# Patient Record
Sex: Female | Born: 1935 | Race: White | Hispanic: No | Smoking: Never smoker
Health system: Southern US, Community
[De-identification: ages and names within clinical notes are randomized; demographics above are authoritative.]

## PROBLEM LIST (undated history)

## (undated) DIAGNOSIS — I739 Peripheral vascular disease, unspecified: Secondary | ICD-10-CM

## (undated) DIAGNOSIS — K5730 Diverticulosis of large intestine without perforation or abscess without bleeding: Secondary | ICD-10-CM

## (undated) DIAGNOSIS — K219 Gastro-esophageal reflux disease without esophagitis: Secondary | ICD-10-CM

## (undated) DIAGNOSIS — K644 Residual hemorrhoidal skin tags: Secondary | ICD-10-CM

## (undated) DIAGNOSIS — N184 Chronic kidney disease, stage 4 (severe): Secondary | ICD-10-CM

## (undated) DIAGNOSIS — M1990 Unspecified osteoarthritis, unspecified site: Secondary | ICD-10-CM

## (undated) DIAGNOSIS — N6019 Diffuse cystic mastopathy of unspecified breast: Secondary | ICD-10-CM

## (undated) DIAGNOSIS — R002 Palpitations: Secondary | ICD-10-CM

## (undated) DIAGNOSIS — I10 Essential (primary) hypertension: Secondary | ICD-10-CM

## (undated) DIAGNOSIS — K449 Diaphragmatic hernia without obstruction or gangrene: Secondary | ICD-10-CM

## (undated) DIAGNOSIS — I82409 Acute embolism and thrombosis of unspecified deep veins of unspecified lower extremity: Secondary | ICD-10-CM

## (undated) DIAGNOSIS — H269 Unspecified cataract: Secondary | ICD-10-CM

## (undated) HISTORY — DX: Unspecified osteoarthritis, unspecified site: M19.90

## (undated) HISTORY — PX: APPENDECTOMY: SHX54

## (undated) HISTORY — DX: Residual hemorrhoidal skin tags: K64.4

## (undated) HISTORY — DX: Diaphragmatic hernia without obstruction or gangrene: K44.9

## (undated) HISTORY — DX: Acute embolism and thrombosis of unspecified deep veins of unspecified lower extremity: I82.409

## (undated) HISTORY — DX: Palpitations: R00.2

## (undated) HISTORY — DX: Essential (primary) hypertension: I10

## (undated) HISTORY — DX: Diverticulosis of large intestine without perforation or abscess without bleeding: K57.30

## (undated) HISTORY — PX: EYE SURGERY: SHX253

## (undated) HISTORY — DX: Peripheral vascular disease, unspecified: I73.9

## (undated) HISTORY — DX: Diffuse cystic mastopathy of unspecified breast: N60.19

## (undated) HISTORY — DX: Gastro-esophageal reflux disease without esophagitis: K21.9

## (undated) HISTORY — DX: Chronic kidney disease, stage 4 (severe): N18.4

## (undated) HISTORY — PX: CYSTECTOMY: SUR359

## (undated) HISTORY — PX: TONSILLECTOMY: SUR1361

## (undated) HISTORY — DX: Unspecified cataract: H26.9

---
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
## 1973-12-04 HISTORY — PX: BREAST EXCISIONAL BIOPSY: SUR124

## 1998-08-04 ENCOUNTER — Ambulatory Visit (HOSPITAL_COMMUNITY): Admission: RE | Admit: 1998-08-04 | Discharge: 1998-08-04 | Payer: Self-pay | Admitting: *Deleted

## 1999-02-17 ENCOUNTER — Ambulatory Visit (HOSPITAL_COMMUNITY): Admission: RE | Admit: 1999-02-17 | Discharge: 1999-02-17 | Payer: Self-pay | Admitting: *Deleted

## 1999-02-17 ENCOUNTER — Encounter: Payer: Self-pay | Admitting: *Deleted

## 2000-02-20 ENCOUNTER — Encounter: Payer: Self-pay | Admitting: *Deleted

## 2000-02-20 ENCOUNTER — Ambulatory Visit (HOSPITAL_COMMUNITY): Admission: RE | Admit: 2000-02-20 | Discharge: 2000-02-20 | Payer: Self-pay | Admitting: *Deleted

## 2000-03-02 ENCOUNTER — Ambulatory Visit (HOSPITAL_BASED_OUTPATIENT_CLINIC_OR_DEPARTMENT_OTHER): Admission: RE | Admit: 2000-03-02 | Discharge: 2000-03-02 | Payer: Self-pay | Admitting: *Deleted

## 2000-05-25 ENCOUNTER — Ambulatory Visit (HOSPITAL_COMMUNITY): Admission: RE | Admit: 2000-05-25 | Discharge: 2000-05-25 | Payer: Self-pay | Admitting: Family Medicine

## 2000-05-25 ENCOUNTER — Encounter: Payer: Self-pay | Admitting: Family Medicine

## 2000-06-01 ENCOUNTER — Ambulatory Visit (HOSPITAL_COMMUNITY): Admission: RE | Admit: 2000-06-01 | Discharge: 2000-06-01 | Payer: Self-pay | Admitting: Family Medicine

## 2000-06-01 ENCOUNTER — Encounter: Payer: Self-pay | Admitting: Family Medicine

## 2001-02-21 ENCOUNTER — Ambulatory Visit (HOSPITAL_COMMUNITY): Admission: RE | Admit: 2001-02-21 | Discharge: 2001-02-21 | Payer: Self-pay | Admitting: *Deleted

## 2001-02-21 ENCOUNTER — Encounter: Payer: Self-pay | Admitting: *Deleted

## 2001-12-04 DIAGNOSIS — K644 Residual hemorrhoidal skin tags: Secondary | ICD-10-CM

## 2001-12-04 HISTORY — DX: Residual hemorrhoidal skin tags: K64.4

## 2002-02-24 ENCOUNTER — Encounter: Payer: Self-pay | Admitting: *Deleted

## 2002-02-24 ENCOUNTER — Ambulatory Visit (HOSPITAL_COMMUNITY): Admission: RE | Admit: 2002-02-24 | Discharge: 2002-02-24 | Payer: Self-pay | Admitting: *Deleted

## 2002-04-09 ENCOUNTER — Ambulatory Visit (HOSPITAL_COMMUNITY): Admission: RE | Admit: 2002-04-09 | Discharge: 2002-04-09 | Payer: Self-pay | Admitting: Family Medicine

## 2002-04-09 ENCOUNTER — Encounter: Payer: Self-pay | Admitting: Family Medicine

## 2002-12-04 DIAGNOSIS — K219 Gastro-esophageal reflux disease without esophagitis: Secondary | ICD-10-CM

## 2002-12-04 DIAGNOSIS — K449 Diaphragmatic hernia without obstruction or gangrene: Secondary | ICD-10-CM

## 2002-12-04 HISTORY — DX: Diaphragmatic hernia without obstruction or gangrene: K44.9

## 2002-12-04 HISTORY — DX: Gastro-esophageal reflux disease without esophagitis: K21.9

## 2003-02-11 ENCOUNTER — Ambulatory Visit (HOSPITAL_COMMUNITY): Admission: RE | Admit: 2003-02-11 | Discharge: 2003-02-11 | Payer: Self-pay | Admitting: Gastroenterology

## 2003-03-02 ENCOUNTER — Ambulatory Visit (HOSPITAL_COMMUNITY): Admission: RE | Admit: 2003-03-02 | Discharge: 2003-03-02 | Payer: Self-pay | Admitting: *Deleted

## 2003-03-02 ENCOUNTER — Encounter: Payer: Self-pay | Admitting: *Deleted

## 2003-04-12 ENCOUNTER — Observation Stay (HOSPITAL_COMMUNITY): Admission: EM | Admit: 2003-04-12 | Discharge: 2003-04-15 | Payer: Self-pay | Admitting: Emergency Medicine

## 2003-04-12 ENCOUNTER — Encounter: Payer: Self-pay | Admitting: Family Medicine

## 2003-04-30 ENCOUNTER — Encounter: Admission: RE | Admit: 2003-04-30 | Discharge: 2003-04-30 | Payer: Self-pay | Admitting: Family Medicine

## 2004-03-08 ENCOUNTER — Ambulatory Visit (HOSPITAL_COMMUNITY): Admission: RE | Admit: 2004-03-08 | Discharge: 2004-03-08 | Payer: Self-pay | Admitting: Obstetrics and Gynecology

## 2004-04-07 ENCOUNTER — Other Ambulatory Visit: Admission: RE | Admit: 2004-04-07 | Discharge: 2004-04-07 | Payer: Self-pay | Admitting: Family Medicine

## 2004-09-03 ENCOUNTER — Ambulatory Visit (HOSPITAL_COMMUNITY): Admission: RE | Admit: 2004-09-03 | Discharge: 2004-09-03 | Payer: Self-pay | Admitting: Family Medicine

## 2004-09-13 ENCOUNTER — Ambulatory Visit (HOSPITAL_COMMUNITY): Admission: RE | Admit: 2004-09-13 | Discharge: 2004-09-13 | Payer: Self-pay | Admitting: Family Medicine

## 2005-01-11 ENCOUNTER — Ambulatory Visit (HOSPITAL_COMMUNITY): Admission: RE | Admit: 2005-01-11 | Discharge: 2005-01-11 | Payer: Self-pay | Admitting: Family Medicine

## 2005-01-30 ENCOUNTER — Other Ambulatory Visit: Admission: RE | Admit: 2005-01-30 | Discharge: 2005-01-30 | Payer: Self-pay | Admitting: Family Medicine

## 2005-03-09 ENCOUNTER — Ambulatory Visit (HOSPITAL_COMMUNITY): Admission: RE | Admit: 2005-03-09 | Discharge: 2005-03-09 | Payer: Self-pay | Admitting: Surgery

## 2005-09-11 ENCOUNTER — Ambulatory Visit (HOSPITAL_COMMUNITY): Admission: RE | Admit: 2005-09-11 | Discharge: 2005-09-11 | Payer: Self-pay | Admitting: Family Medicine

## 2005-11-10 ENCOUNTER — Encounter (INDEPENDENT_AMBULATORY_CARE_PROVIDER_SITE_OTHER): Payer: Self-pay | Admitting: Specialist

## 2005-11-10 ENCOUNTER — Inpatient Hospital Stay (HOSPITAL_COMMUNITY): Admission: RE | Admit: 2005-11-10 | Discharge: 2005-11-17 | Payer: Self-pay | Admitting: Surgery

## 2006-03-14 ENCOUNTER — Ambulatory Visit (HOSPITAL_COMMUNITY): Admission: RE | Admit: 2006-03-14 | Discharge: 2006-03-14 | Payer: Self-pay | Admitting: Obstetrics and Gynecology

## 2006-05-03 ENCOUNTER — Ambulatory Visit: Payer: Self-pay

## 2006-12-04 DIAGNOSIS — K5730 Diverticulosis of large intestine without perforation or abscess without bleeding: Secondary | ICD-10-CM

## 2006-12-04 HISTORY — DX: Diverticulosis of large intestine without perforation or abscess without bleeding: K57.30

## 2007-03-18 ENCOUNTER — Ambulatory Visit (HOSPITAL_COMMUNITY): Admission: RE | Admit: 2007-03-18 | Discharge: 2007-03-18 | Payer: Self-pay | Admitting: Surgery

## 2007-04-01 ENCOUNTER — Other Ambulatory Visit: Admission: RE | Admit: 2007-04-01 | Discharge: 2007-04-01 | Payer: Self-pay | Admitting: Family Medicine

## 2007-09-18 ENCOUNTER — Ambulatory Visit: Payer: Self-pay | Admitting: Gastroenterology

## 2007-10-02 ENCOUNTER — Ambulatory Visit: Payer: Self-pay | Admitting: Gastroenterology

## 2008-03-17 ENCOUNTER — Ambulatory Visit (HOSPITAL_COMMUNITY): Admission: RE | Admit: 2008-03-17 | Discharge: 2008-03-17 | Payer: Self-pay | Admitting: Surgery

## 2008-12-02 ENCOUNTER — Ambulatory Visit: Payer: Self-pay | Admitting: Cardiology

## 2008-12-11 ENCOUNTER — Ambulatory Visit: Payer: Self-pay | Admitting: Cardiology

## 2008-12-11 ENCOUNTER — Ambulatory Visit: Payer: Self-pay

## 2008-12-11 ENCOUNTER — Encounter: Payer: Self-pay | Admitting: Cardiology

## 2008-12-21 ENCOUNTER — Ambulatory Visit: Payer: Self-pay | Admitting: Cardiology

## 2009-01-06 DIAGNOSIS — I10 Essential (primary) hypertension: Secondary | ICD-10-CM | POA: Insufficient documentation

## 2009-01-06 DIAGNOSIS — R002 Palpitations: Secondary | ICD-10-CM | POA: Insufficient documentation

## 2009-01-06 DIAGNOSIS — E785 Hyperlipidemia, unspecified: Secondary | ICD-10-CM | POA: Insufficient documentation

## 2009-03-22 ENCOUNTER — Ambulatory Visit (HOSPITAL_COMMUNITY): Admission: RE | Admit: 2009-03-22 | Discharge: 2009-03-22 | Payer: Self-pay | Admitting: Surgery

## 2009-04-06 ENCOUNTER — Encounter: Payer: Self-pay | Admitting: Cardiology

## 2009-04-09 ENCOUNTER — Encounter: Payer: Self-pay | Admitting: Gastroenterology

## 2009-05-14 ENCOUNTER — Encounter: Payer: Self-pay | Admitting: Cardiology

## 2009-05-17 ENCOUNTER — Telehealth: Payer: Self-pay | Admitting: Cardiology

## 2009-05-17 ENCOUNTER — Encounter: Payer: Self-pay | Admitting: Cardiology

## 2009-06-15 ENCOUNTER — Encounter: Payer: Self-pay | Admitting: Cardiology

## 2009-06-16 ENCOUNTER — Encounter: Payer: Self-pay | Admitting: Cardiology

## 2009-06-22 ENCOUNTER — Ambulatory Visit: Payer: Self-pay | Admitting: Cardiology

## 2009-06-25 ENCOUNTER — Encounter: Payer: Self-pay | Admitting: Cardiology

## 2009-07-12 ENCOUNTER — Encounter: Payer: Self-pay | Admitting: Cardiology

## 2009-10-11 ENCOUNTER — Encounter: Payer: Self-pay | Admitting: Cardiology

## 2010-01-05 ENCOUNTER — Ambulatory Visit: Payer: Self-pay | Admitting: Cardiology

## 2010-03-03 ENCOUNTER — Encounter: Payer: Self-pay | Admitting: Cardiology

## 2010-03-23 ENCOUNTER — Ambulatory Visit (HOSPITAL_COMMUNITY): Admission: RE | Admit: 2010-03-23 | Discharge: 2010-03-23 | Payer: Self-pay | Admitting: Surgery

## 2010-06-27 ENCOUNTER — Encounter: Payer: Self-pay | Admitting: Cardiology

## 2010-08-09 ENCOUNTER — Encounter: Payer: Self-pay | Admitting: Cardiology

## 2010-08-16 ENCOUNTER — Emergency Department (HOSPITAL_COMMUNITY): Admission: EM | Admit: 2010-08-16 | Discharge: 2010-08-16 | Payer: Self-pay | Admitting: Emergency Medicine

## 2010-08-18 ENCOUNTER — Emergency Department (HOSPITAL_COMMUNITY): Admission: EM | Admit: 2010-08-18 | Discharge: 2010-08-18 | Payer: Self-pay | Admitting: Emergency Medicine

## 2010-08-22 ENCOUNTER — Telehealth (INDEPENDENT_AMBULATORY_CARE_PROVIDER_SITE_OTHER): Payer: Self-pay | Admitting: *Deleted

## 2010-08-23 ENCOUNTER — Encounter: Payer: Self-pay | Admitting: Cardiology

## 2010-08-23 ENCOUNTER — Ambulatory Visit: Payer: Self-pay | Admitting: Cardiology

## 2010-08-31 ENCOUNTER — Encounter: Payer: Self-pay | Admitting: Cardiology

## 2010-09-01 ENCOUNTER — Ambulatory Visit: Payer: Self-pay | Admitting: Cardiology

## 2010-09-01 DIAGNOSIS — R55 Syncope and collapse: Secondary | ICD-10-CM | POA: Insufficient documentation

## 2010-09-05 ENCOUNTER — Ambulatory Visit (HOSPITAL_COMMUNITY): Admission: RE | Admit: 2010-09-05 | Discharge: 2010-09-05 | Payer: Self-pay | Admitting: Family Medicine

## 2010-09-15 ENCOUNTER — Ambulatory Visit (HOSPITAL_COMMUNITY): Admission: RE | Admit: 2010-09-15 | Discharge: 2010-09-15 | Payer: Self-pay | Admitting: Cardiology

## 2010-09-15 ENCOUNTER — Encounter: Payer: Self-pay | Admitting: Cardiology

## 2010-09-15 ENCOUNTER — Ambulatory Visit: Payer: Self-pay

## 2010-09-15 ENCOUNTER — Ambulatory Visit: Payer: Self-pay | Admitting: Cardiology

## 2010-10-07 ENCOUNTER — Encounter: Admission: RE | Admit: 2010-10-07 | Discharge: 2010-10-07 | Payer: Self-pay | Admitting: Family Medicine

## 2010-11-11 ENCOUNTER — Encounter (INDEPENDENT_AMBULATORY_CARE_PROVIDER_SITE_OTHER): Payer: Self-pay | Admitting: Cardiology

## 2010-12-14 ENCOUNTER — Encounter: Payer: Self-pay | Admitting: Cardiology

## 2010-12-24 ENCOUNTER — Encounter: Payer: Self-pay | Admitting: Family Medicine

## 2011-01-01 LAB — CONVERTED CEMR LAB
BUN: 31 mg/dL — ABNORMAL HIGH (ref 6–23)
CO2: 27 meq/L (ref 19–32)
Calcium: 9.9 mg/dL (ref 8.4–10.5)
Chloride: 111 meq/L (ref 96–112)
Creatinine, Ser: 1.6 mg/dL — ABNORMAL HIGH (ref 0.4–1.2)
GFR calc non Af Amer: 32.86 mL/min (ref >60)
Glucose, Bld: 78 mg/dL (ref 70–99)
Potassium: 5.1 meq/L (ref 3.5–5.1)
Sodium: 143 meq/L (ref 135–145)

## 2011-01-03 NOTE — Assessment & Plan Note (Signed)
Summary: eph/appt at 10:45-mb   Visit Type:  Follow-up from hosp Primary Provider:  Redge Gainer, MD  CC:  no complaints.  History of Present Illness: 77 yo with history of HTN and palpitations returns for evaluation after recent possible syncopal episode.  Since starting Toprol XL, her palpitations have considerably decreased.  Her BP is under good control today and on her home BP checks.  She is tolerating Livalo without myalgias.  No exertional chest pain or dyspnea.   Earlier this month, patient was walking up the steps from her basement. The next thing she remembers was lying on the floor at the bottom of the steps.  She is not sure what happened (? syncope versus mechanical fall).  No prodrome of lightheadedness or palpitations/heart racing before the event.  She does not remember tripping.  She had severe upper back pain afterwards and was found on plain films to have a thoracic compression fracture.  She is getting an MRI to evaluate this on Monday.  She has had no other episodes of possible syncope of falls.  No spells of lightheadedness.  She is wearing an event monitor, and so far has had only normal sinus rhythm.   Labs (7/10): K 5.7, creatinine 1.44 Labs (11/10): K 4.6, creatinine 1.38 Labs (9/11): HDL 96, LDL 69, creatinine 1.49  Current Medications (verified): 1)  Protonix 40 Mg Tbec (Pantoprazole Sodium) .... Take 1 Tablet By Mouth Once A Day 2)  Aspirin Ec 325 Mg Tbec (Aspirin) .... Take 1 Tablet By Mouth Once A Day 3)  Vitamin D3 1000 Unit  Tabs (Cholecalciferol) .Marland Kitchen.. 1 By Mouth Daily 4)  Amlodipine Besylate 5 Mg Tabs (Amlodipine Besylate) .... Take One Tablet By Mouth Daily 5)  Chlorthalidone 25 Mg  Tabs (Chlorthalidone) .... Take 1 Tab By Mouth Every Day 6)  Toprol Xl 25 Mg Tb24 (Metoprolol Succinate) .Marland Kitchen.. 1 By Mouth Daily 7)  Diovan 320 Mg Tabs (Valsartan) .... Once Daily 8)  Fish Oil Concentrate 1000 Mg Caps (Omega-3 Fatty Acids) .Marland Kitchen.. 1 Daily 9)  Trusopt 2 % Soln  (Dorzolamide Hcl) .... Each Eye Two Times A Day 10)  Livalo 2 Mg Tabs (Pitavastatin Calcium) .... On Mon, Wed, and Friday 11)  Uloric 40 Mg Tabs (Febuxostat) .... Once Daily (Dose Unknown) 12)  Norco 5-325 Mg Tabs (Hydrocodone-Acetaminophen) .Marland Kitchen.. 1-2 Tablets Every 4-6 Hours As Needed For Pain  Allergies (verified): 1)  ! Macrodantin 2)  ! Cipro 3)  ! Pcn 4)  ! Biaxin 5)  ! Fosamax 6)  ! Ace Inhibitors 7)  ! Floxin 8)  ! Sulfa 9)  Sulfamethoxazole (Sulfamethoxazole) 10)  Penicillin V Potassium (Penicillin V Potassium)  Past History:  Past Medical History: Reviewed history from 01/05/2010 and no changes required. 1. Hypertension. 2. Hyperlipidemia. 3. Gastroesophageal reflux disease. 4. Fibrocystic breast disease,. 5. History of colon polyps. 6. Palpitations.  The patient did have a Holter monitor done in January 2010, showing quite frequent PACs, but no SVT or no other serious arrhythmia. 7. Echocardiogram done in January 2010 showed an EF of 70%, normal LV size, no regional Geiger motion abnormalities.  The valves look normal.  There is mild pulmonary hypertension with pulmonary artery systolic pressure of 40 mmHg.  8. CKD with mild hyperkalemia  Family History: Reviewed history from 06/22/2009 and no changes required. The patient's brother had an MI in his 62s.  Social History: Reviewed history from 06/22/2009 and no changes required. Married, lives in [place].  Tobacco Use - No.  Alcohol Use - no  Review of Systems       All systems reviewed and negative except as per HPI.   Vital Signs:  Patient profile:   77 year old female Height:      63 inches Weight:      141.25 pounds BMI:     25.11 Pulse rate:   60 / minute BP sitting:   132 / 56  (right arm) Cuff size:   large  Vitals Entered By: Hansel Feinstein CMA (September 01, 2010 10:53 AM)  Physical Exam  General:  Well developed, well nourished, in no acute distress. Neck:  Neck supple, no JVD. No masses,  thyromegaly or abnormal cervical nodes. Lungs:  Clear bilaterally to auscultation and percussion. Heart:  Non-displaced PMI, chest non-tender; regular rate and rhythm, S1, S2 with 2/6 early systolic murmur at the upper sternal border.  +S4. Carotid upstroke normal, no bruit. Pedals normal pulses.  No edema.  Abdomen:  Bowel sounds positive; abdomen soft and non-tender without masses, organomegaly, or hernias noted. No hepatosplenomegaly. Extremities:  No clubbing or cyanosis. Neurologic:  Alert and oriented x 3. Psych:  Normal affect.   Impression & Recommendations:  Problem # 1:  SYNCOPE (ICD-780.2) ? syncopal episode versus mechanical fall (tripping).  No prior prodrome of lightheadedness or palpitations.  No episode prior or since.  So far, event monitor is unremarkable.   - Complete the event monitor (total 3 weeks) - Echocardiogram for valvular assessment (ok in 1/10 but given new event would reassess).  - Carotid US  Problem # 2:  HYPERTENSION (ICD-401.9) BP has been at goal.   Problem # 3:  PALPITATIONS (ICD-785.1) Resolved with beta blockade.  Problem # 4:  HYPERLIPIDEMIA (B2193296.4) Excellent when last checked.  Tolerating Livalo.   Other Orders: Echocardiogram (Echo) Carotid Duplex (Carotid Duplex)  Patient Instructions: 1)  Your physician has requested that you have an echocardiogram.  Echocardiography is a painless test that uses sound waves to create images of your heart. It provides your doctor with information about the size and shape of your heart and how well your heart's chambers and valves are working.  This procedure takes approximately one hour. There are no restrictions for this procedure. 2)  Your physician has requested that you have a carotid duplex. This test is an ultrasound of the carotid arteries in your neck. It looks at blood flow through these arteries that supply the brain with blood. Allow one hour for this exam. There are no restrictions or special  instructions. 3)  Your physician wants you to follow-up in: 6 months.  You will receive a reminder letter in the mail two months in advance. If you don't receive a letter, please call our office to schedule the follow-up appointment.

## 2011-01-03 NOTE — Assessment & Plan Note (Signed)
Summary: 6 month rov/sl   Primary Provider:  Redge Gainer, MD  CC:  6 month follow up.  Pt reports no new complaints at this time.  History of Present Illness: 77 yo with history of HTN and palpitations returns for evaluation.  Since starting Toprol XL, her palpitations have considerably decreased.  Her BP is under good control today and at last check, her hyperkalemia had resolved.  She has not been able to tolerate any statin due to myalgias, even Crestor at 5 mg every other day.  She continues to walk on a treadmill for exercise without exertional dyspnea.  No chest pain.    ECG: NSR, Qs in V1 and V2 (nonspecific)  Labs (7/10): K 5.7, creatinine 1.44 Labs (11/10): K 4.6, creatinine 1.38  Current Medications (verified): 1)  Protonix 40 Mg Tbec (Pantoprazole Sodium) .... Take 1 Tablet By Mouth Once A Day 2)  Aspirin Ec 325 Mg Tbec (Aspirin) .... Take 1 Tablet By Mouth Once A Day 3)  Vitamin D3 1000 Unit  Tabs (Cholecalciferol) .Marland Kitchen.. 1 By Mouth Daily 4)  Amlodipine Besylate 5 Mg Tabs (Amlodipine Besylate) .... Take One Tablet By Mouth Daily 5)  Chlorthalidone 25 Mg  Tabs (Chlorthalidone) .... Take 1 Tab By Mouth Every Day 6)  Toprol Xl 25 Mg Tb24 (Metoprolol Succinate) .Marland Kitchen.. 1 By Mouth Daily 7)  Diovan 160 Mg Tabs (Valsartan) .Marland Kitchen.. 1 Daily 8)  Fish Oil Concentrate 1000 Mg Caps (Omega-3 Fatty Acids) .Marland Kitchen.. 1 Daily 9)  Trusopt 2 % Soln (Dorzolamide Hcl) .... Each Eye Two Times A Day  Allergies (verified): 1)  ! Macrodantin 2)  ! Cipro 3)  ! Pcn 4)  ! Biaxin 5)  ! Fosamax 6)  ! Ace Inhibitors 7)  ! Floxin 8)  ! Sulfa 9)  Sulfamethoxazole (Sulfamethoxazole) 10)  Penicillin V Potassium (Penicillin V Potassium)  Past History:  Past Medical History: 1. Hypertension. 2. Hyperlipidemia. 3. Gastroesophageal reflux disease. 4. Fibrocystic breast disease,. 5. History of colon polyps. 6. Palpitations.  The patient did have a Holter monitor done in January 2010, showing quite frequent  PACs, but no SVT or no other serious arrhythmia. 7. Echocardiogram done in January 2010 showed an EF of 70%, normal LV size, no regional Schryver motion abnormalities.  The valves look normal.  There is mild pulmonary hypertension with pulmonary artery systolic pressure of 40 mmHg.  8. CKD with mild hyperkalemia  Family History: Reviewed history from 06/22/2009 and no changes required. The patient's brother had an MI in his 60s.  Social History: Reviewed history from 06/22/2009 and no changes required. Married, lives in [place].  Tobacco Use - No.  Alcohol Use - no  Review of Systems       All systems reviewed and negative except as per HPI.   Vital Signs:  Patient profile:   77 year old female Height:      63 inches Weight:      139 pounds BMI:     24.71 Pulse rate:   58 / minute Pulse rhythm:   regular BP sitting:   132 / 60  (left arm) Cuff size:   large  Vitals Entered By: Doug Sou CMA (January 05, 2010 10:22 AM)  Physical Exam  General:  Well developed, well nourished, in no acute distress. Neck:  Neck supple, no JVD. No masses, thyromegaly or abnormal cervical nodes. Lungs:  Clear bilaterally to auscultation and percussion. Heart:  Non-displaced PMI, chest non-tender; regular rate and rhythm, S1, S2 without  murmurs, rubs.  +S4. Carotid upstroke normal, no bruit. Normal abdominal aortic size, no bruits. Femorals normal pulses, no bruits. Pedals normal pulses.  No edema.  Abdomen:  Bowel sounds positive; abdomen soft and non-tender without masses, organomegaly, or hernias noted. No hepatosplenomegaly. Extremities:  No clubbing or cyanosis. Neurologic:  Alert and oriented x 3. Psych:  Normal affect.   Impression & Recommendations:  Problem # 1:  HYPERTENSION (ICD-401.9) Good BP control here and on her home cuff.  K and creatinine were stable when last checked.  Repeat BMP today and continue current meds.   Problem # 2:  PALPITATIONS (ICD-785.1) Patient had  frequent PACs on monitor.  Symptoms were distressing.  They seem to have resolved with Toprol XL.    Problem # 3:  HYPERLIPIDEMIA (B2193296.4) Patient had myalgias with low dose Crestor, similar to Lipitor.  She should continue fish oil.  She will have her lipids done at Dr. Tawanna Sat office.   Other Orders: EKG w/ Interpretation (93000) TLB-BMP (Basic Metabolic Panel-BMET) (99991111)  Patient Instructions: 1)  Your physician recommends that you return have lab-- BMP 401.9 785.1 2)  Your physician wants you to follow-up in: 1 year with Dr [name].  You will receive a reminder letter in the mail two months in advance. If you don't receive a letter, please call our office to schedule the follow-up appointment. Prescriptions: CHLORTHALIDONE 25 MG  TABS (CHLORTHALIDONE) Take 1 tab by mouth every day  #90 x 3   Entered by:   Doug Sou CMA   Authorized by:   [name], MD   Signed by:   Doug Sou CMA on 01/05/2010   Method used:   Faxed to ...       [facility]       [address], [place]  [address]       Ph: [phone] or [phone]       Fax: [phone]   RxID:   [id number]

## 2011-01-03 NOTE — Procedures (Signed)
Summary: Summary Report  Summary Report   Imported By: Gemma Payor 09/16/2010 11:34:23  _____________________________________________________________________  External Attachment:    Type:   Image     Comment:   External Document

## 2011-01-03 NOTE — Letter (Signed)
Summary: Self-Recorded Vitals  Self-Recorded Vitals   Imported By: Marilynne Drivers 09/22/2010 15:24:53  _____________________________________________________________________  External Attachment:    Type:   Image     Comment:   External Document

## 2011-01-03 NOTE — Progress Notes (Signed)
Summary: Event monitor  Phone Note Outgoing Call Call back at [facility] Phone [phone]   Call placed by: Eliezer Lofts, EMT-P,  August 22, 2010 4:48 PM Summary of Call: Left message on machine to call back for Event monitor.  Follow-up for Phone Call        Pt rec'd event monitor 08/23/10 Eliezer Lofts, EMT-P  August 23, 2010 4:20 PM

## 2011-01-05 ENCOUNTER — Encounter: Payer: Self-pay | Admitting: Cardiology

## 2011-01-06 ENCOUNTER — Ambulatory Visit (INDEPENDENT_AMBULATORY_CARE_PROVIDER_SITE_OTHER): Payer: Medicare Other | Admitting: Cardiology

## 2011-01-06 ENCOUNTER — Encounter: Payer: Self-pay | Admitting: Cardiology

## 2011-01-06 ENCOUNTER — Ambulatory Visit: Admit: 2011-01-06 | Payer: Self-pay

## 2011-01-06 DIAGNOSIS — R55 Syncope and collapse: Secondary | ICD-10-CM

## 2011-01-06 DIAGNOSIS — I10 Essential (primary) hypertension: Secondary | ICD-10-CM

## 2011-01-11 NOTE — Letter (Signed)
Summary: Dr Ashok Cordia Office Note   Dr Ashok Cordia Office Note   Imported By: Sallee Provencal 01/06/2011 12:36:05  _____________________________________________________________________  External Attachment:    Type:   Image     Comment:   External Document

## 2011-01-19 NOTE — Assessment & Plan Note (Signed)
Summary: F1Y/RS PER PT-MJ/AMD appt confirm=mj   Vital Signs:  Patient profile:   77 year old female Height:      63 inches Weight:      141 pounds BMI:     25.07 Pulse rate:   54 / minute Pulse rhythm:   regular BP sitting:   162 / 66  (left arm) Cuff size:   regular  Vitals Entered By: Doug Sou CMA (January 06, 2011 10:10 AM)  Primary Provider:  Redge Gainer, MD  CC:  1 yr/ pt needs renewed rx for chlorthalidone and norvasc.  History of Present Illness: 77 yo with history of HTN and palpitations returns for Cardiology followup. Since starting Toprol XL, her palpitations have considerably decreased.  Her BP is high today but has been in the 120s/80s every time she checks at home (checks it several times a week).  She has calibrated her BP cuff at Dr. Tawanna Sat office and it is accurate.  She is tolerating Livalo without myalgias.  No exertional chest pain or dyspnea. She continues to walk 1-2 miles a day with no problems.  Recent echo showed preserved systolic function and carotid dopplers showed no significant disease.  She had a fall on the stairs in 9/11 that was likely a mechanical fall from tripping.  She has had no falls/syncope/lightheadedness since that time.  She has had some back pain from compression fractures since then.   Labs (7/10): K 5.7, creatinine 1.44 Labs (11/10): K 4.6, creatinine 1.38 Labs (9/11): HDL 96, LDL 69, creatinine  Labs (12/11): TSH normal  ECG: NSR, normal  Current Medications (verified): 1)  Protonix 40 Mg Tbec (Pantoprazole Sodium) .... Take 1 Tablet By Mouth Once A Day 2)  Aspirin Ec 325 Mg Tbec (Aspirin) .... Take 1 Tablet By Mouth Once A Day 3)  Vitamin D3 1000 Unit  Tabs (Cholecalciferol) .Marland Kitchen.. 1 By Mouth Daily 4)  Amlodipine Besylate 5 Mg Tabs (Amlodipine Besylate) .... Take One Tablet By Mouth Daily 5)  Chlorthalidone 25 Mg  Tabs (Chlorthalidone) .... Take 1 Tab By Mouth Every Day 6)  Toprol Xl 25 Mg Tb24 (Metoprolol Succinate) .Marland Kitchen.. 1  By Mouth Daily 7)  Diovan 320 Mg Tabs (Valsartan) .... Once Daily 8)  Fish Oil Concentrate 1000 Mg Caps (Omega-3 Fatty Acids) .Marland Kitchen.. 1 Daily 9)  Trusopt 2 % Soln (Dorzolamide Hcl) .... Each Eye Two Times A Day 10)  Livalo 2 Mg Tabs (Pitavastatin Calcium) .... On Mon, Wed, and Friday 11)  Norco 5-325 Mg Tabs (Hydrocodone-Acetaminophen) .Marland Kitchen.. 1-2 Tablets Every 4-6 Hours As Needed For Pain  Allergies (verified): 1)  ! Macrodantin 2)  ! Cipro 3)  ! Pcn 4)  ! Biaxin 5)  ! Fosamax 6)  ! Ace Inhibitors 7)  ! Floxin 8)  ! Sulfa 9)  Sulfamethoxazole (Sulfamethoxazole) 10)  Penicillin V Potassium (Penicillin V Potassium)  Past History:  Past Medical History: 1. Hypertension. 2. Hyperlipidemia. 3. Gastroesophageal reflux disease. 4. Fibrocystic breast disease,. 5. History of colon polyps. 6. Palpitations.  The patient did have a Holter monitor done in January 2010, showing quite frequent PACs, but no SVT or no other serious arrhythmia.  3 week event monitor in 9/11 showed mild sinus bradycardia to the 50s at times, otherwise unremarkable.  7. Echocardiogram done in January 2010 showed an EF of 70%, normal LV size, no regional Helmers motion abnormalities.  The valves look normal.  There is mild pulmonary hypertension with pulmonary artery systolic pressure of 40 mmHg.  Echo (  10/11): Mild focal basal septal hypertrophy, EF [id number], grade I diastolic dysfunction, PA systolic pressure 31 mmHg, borderline atrial septal aneurysm.  8. CKD with mild hyperkalemia 9. Carotid dopplers (10/11): normal.  Family History: Reviewed history from 06/22/2009 and no changes required. The patient's brother had an MI in his 98s.  Social History: Reviewed history from 06/22/2009 and no changes required. Married, lives in [place].  Tobacco Use - No.  Alcohol Use - no  Physical Exam  General:  Well developed, well nourished, in no acute distress. Neck:  Neck supple, no JVD. No masses, thyromegaly or abnormal  cervical nodes. Lungs:  Clear bilaterally to auscultation and percussion. Heart:  Non-displaced PMI, chest non-tender; regular rate and rhythm, S1, S2 with 2/6 early systolic murmur at the upper sternal border.  +S4. Carotid upstroke normal, no bruit. Pedals normal pulses.  No edema.  Abdomen:  Bowel sounds positive; abdomen soft and non-tender without masses, organomegaly, or hernias noted. No hepatosplenomegaly. Extremities:  No clubbing or cyanosis. Neurologic:  Alert and oriented x 3. Psych:  Normal affect.   Impression & Recommendations:  Problem # 1:  SYNCOPE (ICD-780.2) I suspect that the patient's fall in 9/11 was likely mechanical.  She has had no further events as well as an unremarkable 3-week event monitor, carotid doppler study, and echo.   Problem # 2:  HYPERTENSION (ICD-401.9) There is certainly some white-coat HTN here.  BPs running 120s/70s at home each time she checks, and her BP cuff has been calibrated in her PCP's office.    Problem # 3:  HYPERLIPIDEMIA (B2193296.4) Labs to be checked in Dr. Tawanna Sat office in April.   It would be ok to decrease ASA to 81 mg daily.   Patient Instructions: 1)  Your physician has recommended you make the following change in your medication:  2)  Decrease Aspirin to 81mg  daily--this should be coated. 3)  Your physician wants you to follow-up in: 1 year with Dr Aundra Dubin.(FEBRUARY 2013)  You will receive a reminder letter in the mail two months in advance. If you don't receive a letter, please call our office to schedule the follow-up appointment. Prescriptions: AMLODIPINE BESYLATE 5 MG TABS (AMLODIPINE BESYLATE) Take one tablet by mouth daily  #30 x 11   Entered by:   Desiree Lucy, RN, BSN   Authorized by:   [name], MD   Signed by:   Desiree Lucy, RN, BSN on 01/06/2011   Method used:   Faxed to ...       [facility]       [address] [place], [place]  [address]        Ph: [phone] or [phone]       Fax: [phone]   RxID:   [id number] CHLORTHALIDONE 25 MG  TABS (CHLORTHALIDONE) Take 1 tab by mouth every day  #30 x 11   Entered by:   Desiree Lucy, RN, BSN   Authorized by:   [name], MD   Signed by:   Desiree Lucy, RN, BSN on 01/06/2011   Method used:   Faxed to ...       [facility]       [address], [place]  [address]       Ph: [phone] or [phone]  Fax: [phone]   RxIDOV:[id number]

## 2011-01-31 NOTE — Progress Notes (Signed)
Summary: Patients at Home Blood Pressure Vitals   Patients at Home Blood Pressure Vitals   Imported By: Sallee Provencal 01/20/2011 14:49:04  _____________________________________________________________________  External Attachment:    Type:   Image     Comment:   External Document

## 2011-02-20 ENCOUNTER — Other Ambulatory Visit (HOSPITAL_COMMUNITY): Payer: Self-pay | Admitting: Surgery

## 2011-02-20 DIAGNOSIS — Z1231 Encounter for screening mammogram for malignant neoplasm of breast: Secondary | ICD-10-CM

## 2011-03-13 ENCOUNTER — Encounter: Payer: Self-pay | Admitting: Cardiology

## 2011-03-20 ENCOUNTER — Other Ambulatory Visit: Payer: Self-pay | Admitting: Family Medicine

## 2011-03-20 DIAGNOSIS — E041 Nontoxic single thyroid nodule: Secondary | ICD-10-CM

## 2011-03-28 ENCOUNTER — Ambulatory Visit (HOSPITAL_COMMUNITY)
Admission: RE | Admit: 2011-03-28 | Discharge: 2011-03-28 | Disposition: A | Discharge status: Home / Self Care | Payer: Medicare Other | Source: Ambulatory Visit | Attending: Family Medicine | Admitting: Family Medicine

## 2011-03-28 ENCOUNTER — Ambulatory Visit (HOSPITAL_COMMUNITY)
Admission: RE | Admit: 2011-03-28 | Discharge: 2011-03-28 | Disposition: A | Discharge status: Home / Self Care | Payer: Medicare Other | Source: Ambulatory Visit | Attending: Surgery | Admitting: Surgery

## 2011-03-28 DIAGNOSIS — Z1231 Encounter for screening mammogram for malignant neoplasm of breast: Secondary | ICD-10-CM | POA: Insufficient documentation

## 2011-03-28 DIAGNOSIS — E041 Nontoxic single thyroid nodule: Secondary | ICD-10-CM

## 2011-04-18 NOTE — Assessment & Plan Note (Signed)
[facility]                            CARDIOLOGY OFFICE NOTE   NAME:Elizabeth Burch, Elizabeth Burch                     MRN:          [id number]  DATE:12/02/2008                            DOB:          [birth year]    PRIMARY CARE PHYSICIAN:  Chipper Herb, MD   HISTORY OF PRESENT ILLNESS:  This is a 77 year old with history of  hypertension, gastroesophageal reflux disease, and hyperlipidemia who  presents to Cardiology Clinic for evaluation of palpitations and high  blood pressure.  The patient states that for about a week to 10 days  now, she had been noting heart palpitations.  She says that she feels  like her heart is beating hard and fast and she notices this mostly at  night when she goes to bed.  This has actually been happening every  night.  She does not notice that when she is up and active during the  day.  She has had no episodes of lightheadedness or syncope.  She has  not fallen.  Prior to about 10 days ago, she had not had these symptoms  before.  She has no history of arrhythmia.  Additionally for the last 3-  4 weeks, the patient has had a persistent cough/cold.  She has been  congested and she just felt like she has a viral syndrome.  She has  actually been getting better lately; however, in the setting of this  sickness, she states that her blood pressure has been running quite  high.  Her blood pressures at home have been running anywhere from  systolic [id number] to a systolic of [id number].  Today, her blood pressure is 160/57.  She has not used any Sudafed; however, she has been taking some cough  medicine, she cannot remember what it was that she was taking.  The  patient does deny any chest pain or tightness.  She walks on treadmill  for exercise.  She often goes 1-2 miles at a time.  She does not get any  dyspnea on exertion with her exercise and she can climb a flight of  steps with no difficulty.  Finally, of note the patient does have  elevated  cholesterol.  Her LDL cholesterol was 162 in October 2009.  This is somewhat attenuated by her elevated HDL cholesterol of 91.  She  has tried taking Lipitor in the past, but developed severe muscle  soreness with this medication.   PAST MEDICAL HISTORY:  1. Hypertension.  2. Hyperlipidemia.  3. Gastroesophageal reflux disease.  4. Fibrocystic breast disease.  5. History of colonic polyps.   SOCIAL HISTORY:  The patient has never smoked.  She lives in [place], [place] and she is not drinking alcohol.   FAMILY HISTORY:  The patient's brother had an MI in his 53s.   REVIEW OF SYSTEMS:  Negative except as noted in the history of present  illness.   MEDICATIONS:  1. Candesartan 32 mg daily.  2. Protonix 40 mg daily.  3. Aspirin 325 mg daily.  4. Hydrochlorothiazide 12.5 mg daily.  5. Fish oil 2 g  daily.   LABORATORY DATA:  Labs from October 2009, LDL 162, HDL 91, triglycerides  52, total cholesterol 263, creatinine 1.19.  EKG was reviewed and shows  normal sinus rhythm.  There are some T-wave flattening in lead aVL.   PHYSICAL EXAMINATION:  VITAL SIGNS:  Blood pressure 160/57, heart rate  is 65 and regular, weight is 114 pounds.  GENERAL:  This is a well-developed elderly female in no apparent  distress.  NEUROLOGIC:  Alert and orient x3.  Normal affect.  LUNGS:  Clear to auscultation bilaterally with normal respiratory  effort.  NECK:  There is no thyromegaly or thyroid nodule.  There is no JVD.  CARDIOVASCULAR:  Heart regular.  S1 and S2.  There is a soft S4.  There  is no murmur.  There is trace ankle edema.  There are 2+ posterior  tibial pulses bilaterally.  There are no carotid bruits.  HEENT:  Normal.  EXTREMITIES:  No clubbing or cyanosis.  ABDOMEN:  Soft, nontender.  No hepatosplenomegaly.  Normal bowel sounds.  MUSCULOSKELETAL:  Normal exam.  SKIN:  Normal exam.   ASSESSMENT AND PLAN:  This is a 77 year old with a history of  hypertension,  hyperlipidemia, emergency cardiology clinic evaluation of  these problems as well as her heart palpitations.  1. Palpitations.  The patient does experience fast heart beats at      night.  She notes this is mostly when she is lying in bed.  She has      had no symptoms of lightheadedness or syncope.  I do think it would      be reasonable to obtain a 48-hour Holter monitor to see if there is      a clinically relevant arrhythmia present.  The patient does      experience these symptoms every day, so we should be able to catch      any arrhythmia with a 48-hour Holter.  2. Hypertension.  The patient's blood pressure is quite elevated at      160/57 today.  It has been up to systolic of 99991111 at home.  I think      that the best plan would be to stop her hydrochlorothiazide and      instead have her on chlorthalidone 25 mg daily.  Additionally, we      will add Norvasc 5 mg daily to her regimen.  3. Hyperlipidemia.  The patient's LDL is quite elevated at 162.  Her      HDL however is also elevated.  She was not able to tolerate Lipitor      in the past.  Given her risk factors, I think it would be      reasonable to have her on at least a low-dose of statin.  I      suggested that we try 5 mg of Crestor every other day.  If she      experiences any muscle soreness or tightness with this medication,      she was informed that she should stop it.  She should continue her      fish oil as well.    [name], MD  Electronically Signed   DM/MedQ  DD: 12/02/2008  DT: 12/03/2008  Job #: [id number]   cc:   Chipper Herb, M.D.

## 2011-04-18 NOTE — Assessment & Plan Note (Signed)
[facility] OFFICE NOTE   NAME:Stege, CECILY STPETER                     MRN:          [id number]  DATE:12/21/2008                            DOB:          1926/05/02    PRIMARY CARE PHYSICIAN:  Chipper Herb, MD at [facility].   HISTORY OF PRESENT ILLNESS:  This is an 77 year old with history of  hypertension, gastroesophageal reflux disease, and hyperlipidemia who  presented to Cardiology Clinic for evaluation of palpitations and  hypertension.  Holter monitor was done showing quite frequent PACs but  no runs of SVT or atrial fibrillation.  We also did an echocardiogram  that showed normal LV size and systolic function with no regional Dombrosky  motion abnormalities.  Pulmonary systolic pressures were mildly elevated  at 40 mmHg.  The patient states that she has continued to have the  palpitations.  She mainly notices them at night.  She does notice them  every night and they are mildly distressing to her.  She brings her  blood pressures in today.  On her new medication regimen, her home blood  pressure has been running systolic of [id number] to 123456.  Today, her blood  pressure is 156/50 in the office.  She has also tolerated Crestor 5 mg  every other day with no myalgias.  In general, she continues to be quite  active.  She walks on a treadmill for exercise.  She does not have any  chest pain, tightness or dyspnea with exertion.   PAST MEDICAL HISTORY:  1. Hypertension.  2. Hyperlipidemia.  3. Gastroesophageal reflux disease.  4. Fibrocystic breast disease,.  5. History of colon polyps.  6. Palpitations.  The patient did have a Holter monitor done in      January 2010, showing quite frequent PACs, but no SVT or no other      serious arrhythmia.  7. Echocardiogram done in January 2010 showed an EF of 70%, normal LV      size, no regional Gorton motion abnormalities.  The valves look      normal.   There is mild pulmonary hypertension with pulmonary artery      systolic pressure of 40 mmHg.   SOCIAL HISTORY:  The patient has never smoked.  She lives [place],  [place].  She does not drink alcohol.   FAMILY HISTORY:  The patient's brother had an MI in 58s.   MEDICATIONS:  1. Candesartan 32 mg daily.  2. Protonix.  3. Aspirin 325 mg daily.  4. Fish oil.  5. Amlodipine 5 mg daily.  6. Crestor 5 mg every other day.  7. Chlorthalidone 25 mg daily.   PHYSICAL EXAMINATION:  VITAL SIGNS:  Blood pressure is 156/50 today, her  home blood pressure has been running systolic [id number], heart rate is 75  and regular.  GENERAL:  This is a well-developed elderly female in no apparent  distress.  NEUROLOGIC:  Alert and oriented x3.  Normal affect.  LUNGS:  Clear to auscultation bilaterally.  Normal respiratory effort.  NECK:  There is no  thyromegaly or thyroid nodule.  There is no JVD.  CARDIOVASCULAR:  Heart, regular S1 and S2.  There is a soft S4.  There  are no murmurs.  There is a trace ankle edema.  There are 2+ posterior  tibial pulses bilaterally.  There are no carotid bruits.  ABDOMEN:  Soft and nontender.  No hepatosplenomegaly.  Normal bowel  sounds.  EXTREMITIES:  No clubbing or cyanosis.   ASSESSMENT AND PLAN:  This is an 77 year old with history of  hypertension, hyperlipidemia, and palpitations who returns to Cardiology  Clinic for evaluation of these issues.  1. Palpitations.  These are most likely due to the patient's frequent      PACs.  These appear to be benign.  She did not have any episodes of      SVT or atrial fibrillation on her 48-hour Holter monitor.  They do      tend to cause some amount of distress to her as she feels them      every night when she lays down to go to bed.  I did talk her at      length about options for this and she does want to try medications,      so we will try her on Toprol-XL 25 mg a day.  We will have her take      that at  dinnertime and see if that help lessen some of the      palpitations that she feels at night.  It will additionally help      lower her blood pressure a bit more.  2. Hypertension.  The patient's blood pressure is 156/50 today.  Her      blood pressure at home has been running a lot better than it was      before the addition of chlorthalidone and amlodipine.  Her home      systolic blood pressures have ranged from 112-148, they tend to      average around 130, it looks like.  We will continue her on the      current regimen.  We will, as mentioned, add a low dose of Toprol-      XL to help her with the palpitations at night.  She does tell me      that candesartan is quite an expensive copay for her, so I will see      if she can be on generic losartan for a cheaper price.  If that is      the case, we will switch her to losartan 100 mg a day instead of      candesartan 32 mg a day.  She is supposed to have labs done in Dr.      Tawanna Sat office in February and they will get a potassium and a      creatinine at that time.  3. Hyperlipidemia.  The patient's LDL was elevated at 162.  She has      had myalgias with Lipitor in the past.  We have her a low dose of      Crestor 5 mg every other day, and she has tolerated this.  If she      can go 4 weeks on this dose without having any significant      myalgias, I am going to increase it to 5 mg daily.  She will go      back to Dr. Tawanna Sat office in February and she says that she  will      have her lipids checked at that time.  My goal for her would be LDL      at least less than 130, ideally less than 100.  4. We will see the patient back in 6 months to see how the Toprol-XL      is working.     [name], MD  Electronically Signed   DM/MedQ  DD: 12/21/2008  DT: 12/22/2008  Job #: [id number]   cc:   Chipper Herb, M.D.

## 2011-04-21 NOTE — Discharge Summary (Signed)
NAME:  Elizabeth Burch, Elizabeth Burch              ACCOUNT NO.:  [id number]   MEDICAL RECORD NO.:  [id number]          PATIENT TYPE:  INP   LOCATION:  [id number]                         FACILITY:  [facility]   PHYSICIAN:  Fenton Malling. Lucia Gaskins, M.D.  DATE OF BIRTH:  01/24/1926   DATE OF ADMISSION:  11/10/2005  DATE OF DISCHARGE:  11/17/2005                                 DISCHARGE SUMMARY   DISCHARGE DIAGNOSES:  1.  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  2.  Hypertension.  3.  Gastroesophageal reflux disease.  4.  Glaucoma.  5.  History of kidney stones  6.  History of phlebitis.   HISTORY OF PRESENT ILLNESS:  Elizabeth Burch is a 77 year old female who is a  patient of Dr. Morrie Sheldon who was having some back or lower leg weakness when  she had an MRI obtained from a chiropractor that suggested that there was a  mass in her cecum.  She then underwent a CT scan on September 11, 2005, read by  Dr. Earle Gell which showed a 3.6 x 2-cm ovoid mass of her cecum felt to be  a probable mucocele.  The patient had a colonoscopy by Dr. Verl Blalock  in approximately 2003, and this showed no colonic abnormality.  Her only  prior abdominal operation had been a C-section through a lower midline  incision.   PAST MEDICAL HISTORY:  1.  hypertension which is stable.  2.  She had gastroesophageal reflux disease which is stable.  3.  She has glaucoma and is on eye drops.  4.  She has a history of phlebitis, and she takes aspirin.  5.  She has history of kidney stones.  6.  She has had history of benign breast biopsies in the past.   HOSPITAL COURSE:  I discussed with her the options for treating this  potential cecal mucocele.  I think she would be best served with probably  having a surgical excision of her right colon because of the malignant  potential of mucoceles of the cecum.  Therefore, she completed a mechanical  antibiotic bowel prep at home.  She presented to the hospital where she  underwent a  laparoscopic-assisted right hemicolectomy, and I also did  peritoneal washings.   On the first postoperative day, she was doing well.  Her labs showed sodium  138, creatinine 0.9, hemoglobin 12, and a white blood count of 8400.   She had some mild distention for about 5 days, but she was started on  liquids on her fifth postoperative day, November 15, 2005.  She had a bowel  movement, and her diet was advanced.  By November 17, 2005, she was ready  for discharge.   DISCHARGE INSTRUCTIONS:  1.  Resume home medications.  2.  Take Vicodin for pain.  3.  She can shower.  4.  No driving for 3 or 4 days.  5.  See me back in 2 to 3 weeks in the office for followup.   FINAL PATHOLOGY:  A benign peritoneal cyst associated with serosal fibrosis  with no evidence of malignancy.  These are felt to be just benign peritoneal  inclusion cysts.  I also did peritoneal washings which only showed reactive  mesothelial cells and no malignant cells.   DISCHARGE CONDITION:  Good.      Fenton Malling. Lucia Gaskins, M.D.  Electronically Signed     DHN/MEDQ  D:  01/03/2006  T:  01/03/2006  Job:  [id number]   cc:   Chipper Herb, M.D.  Fax: [phone]   Loralee Pacas. Sharlett Iles, M.D. [facility]  [address]  [place] [address]

## 2011-04-21 NOTE — Op Note (Signed)
NAME:  Elizabeth Burch, Elizabeth Burch              ACCOUNT NO.:  [id number]   MEDICAL RECORD NO.:  [id number]          PATIENT TYPE:  INP   LOCATION:  [id number]                         FACILITY:  [facility]   PHYSICIAN:  Fenton Malling. Lucia Gaskins, M.D.  DATE OF BIRTH:  [birth year]   DATE OF PROCEDURE:  11/10/2005  DATE OF DISCHARGE:                                 OPERATIVE REPORT   PREOPERATIVE DIAGNOSES:  Mucocele of the appendix.   POSTOPERATIVE DIAGNOSES:  Mucocele of the appendix/cecum with localized cyst  around the cecum.   PROCEDURE:  Laparoscopic converted to open right hemicolectomy.   SURGEON:  Fenton Malling. Lucia Gaskins, M.D.   FIRST ASSISTANT:  Georgina Quint, M.D.   ANESTHESIA:  General endotracheal with approximately 30 mL of 0.5% Marcaine.   COMPLICATIONS:  None.   ESTIMATED BLOOD LOSS:  500 mL.   DRAINS:  None.   INDICATIONS FOR PROCEDURE:  Ms. Elizabeth Burch is a 77 year old white female who was  being worked up for some back pain and was noted to have a mass at her  cecum. CT scan suggested this probably to be a mucocele of the appendix. She  underwent a negative colonscopy by Dr. Threasa Beards in 2003.   I have discussed with the patient about both laparoscopic and open  approaches to this and shoe now comes for attempted laparoscopic  appendectomy. The indications and potential complications were explained to  the patient. The potential complications include but not limited to,  bleeding, infection, bowel leak, and the necessity of open surgery.   DESCRIPTION OF PROCEDURE:  The patient in supine position with the left arm  tucked. She had a Foley catheter placed. She had had antibiotic and  mechanical bowel prep the day before surgery and had IV and Mefoxin at the  initiation of the procedure.   I went through an infraumbilical incision with sharp dissection carried down  to the abdominal cavity. A zero degree 10 mm laparoscope was inserted  through a 12 mm Hasson trocar. The Hasson trocar was secured with  a #0  Vicryl suture. I placed an additional 5 mm trocar in the right upper  quadrant and a 10 mm trocar in the left lower quadrant and then did an  abdominal exploration. Right and left lobes of the liver were unremarkable.  The anterior [place] of the stomach was unremarkable. The uterus and both  ovaries and tubes were visualized. There was no mass, lesion or nodularity  within this area. I then visualized the cecum/appendix.   The appendix had what looked like a mucocele with a boggy gelatinous  appearance.  Unfortunately for the patient there were also a series of beads  of gelatinous mucous nodules that were covering part of the cecum. I did not  think that I could comfortably resect this laparoscopically. I took photos  of this and then I did an open laparotomy.   Again manually I explored both diaphragms, there was no nodularity in the  diaphragms or liver. I looked at the liver again on open case and down the  pelvis, there was no nodularity nor mass. I then  turned my attention to the  right colon. I mobilized the right colon up to the splenic flexure. I  divided the terminal ileum with a GIA-75 stapler. I divided the right colon  right below the hepatic flexure with a GIA-75 stapler, took the mesentery  down with Kelly clamps and then sent the specimen off. I spoke with Dr.  Deborra Medina about my concerns about this being a mucocele and its malignant  potential. This certainly had a benign gross appearance to it but again it  did have these little cyst nodules that emanated from the appendix. I did  not think that I had ruptured any of these nodules in the manipulation or  handing of the cecum.   I did do peritoneal washings, I put a liter of saline, down the pelvis and  over the liver and aspirated about 7 or 800 mL out and sent this out as a  separate specimen. I then did a side to side stapled anastomosis brining the  terminal ileum up to the side of the hepatic flexure,  transverse colon with  a 75 stapler. There was no bleeding from the staple line. I then closed the  enterotomy with interrupted 3-0 silk sutures. There was no tension on the  suture line. I then buttressed the suture line with 3-0 silk sutures and  covered this with some fat/omentum over the staple line and put some sutures  in the crotch. I closed the mesenteric defect with interrupted 2-0 and 3-0  silk sutures and then replaced the new anastomosis back on the right colonic  gutter.   I irrigated the abdomen with 2 liters of saline. I then closed the abdominal  [place] with two running #1 PDS sutures and with some interrupted PDS sutures.  I irrigated the wound and closed the wound with skin staples.   I did infiltrate 30 mL of 0.5% Marcaine into the midline incision. I also  stapled or closed the two port sites, one in the right upper quadrant and  one in the left lower quadrant. The sponge and needle count were correct at  the end of the case.   The patient tolerated the procedure well. Final pathology is pending at the  time of this dictation.      Fenton Malling. Lucia Gaskins, M.D.  Electronically Signed     DHN/MEDQ  D:  11/10/2005  T:  11/10/2005  Job:  [id number]   cc:   Chipper Herb, M.D.  Fax: [phone]   Loralee Pacas. Sharlett Iles, M.D. [facility]  [address]  [place] [address]

## 2011-04-21 NOTE — Discharge Summary (Signed)
NAME:  Elizabeth Burch, Elizabeth Burch                        ACCOUNT NO.:  [id number]   MEDICAL RECORD NO.:  [id number]                   PATIENT TYPE:  INP   LOCATION:  5152                                 FACILITY:  [facility]   PHYSICIAN:  Blane Ohara McDiarmid, M.D.             DATE OF BIRTH:  06-Mar-1926   DATE OF ADMISSION:  04/12/2003  DATE OF DISCHARGE:  04/15/2003                                 DISCHARGE SUMMARY   DISCHARGE DIAGNOSES:  1. Infectious enteritis.  2. Dehydration.  3. Hypertension.  4. Gastroesophageal reflux disease.  5. Normocytic anemia.  6. Diverticulosis.  7. Hyperlipidemia.   DISCHARGE MEDICATIONS:  1. Atacand 32 mg p.o. daily.  2. Protonix 40 mg p.o. daily.  3. Lipitor one-half tablet orally nightly.  4. Aspirin 325 mg p.o. daily.  5. Prescription eye drops as directed.  6. Norfloxacin 400 mg p.o. b.i.d. x3 days, then stop.  7. Claritin one tablet p.o. daily x3 days, then stop.  8. She may also take Pepto-Bismol 30 mL p.o. every hours as needed for     diarrhea.   DISCHARGE INSTRUCTIONS:  The patient is instructed to drink plenty of  liquids.  Recommend diluted Gatorade, advance as tolerated with diet  starting with bananas, rice, apple sauce and toast when stool becomes more  firm.   FOLLOWUP:  Followup appointment should be made at [facility]; she currently has an appointment in two weeks post discharge,  however, if she continues to have greater than six loose stools per day, she  should be seen by Friday.   HOSPITAL COURSE:  [age]-year-old Caucasian female presented with a  two- to three-day history of diarrhea and fever.  She was complaining of  chills and diarrhea with a T-max of 101 at home, treated with Tylenol.  She  developed watery diarrhea and averaged approximately one stool per hour for  the past two to three days prior to admission.  She explained that it was  non-mucusy but did have blood-staining in the toilet.   She was complaining  of nausea also but no vomiting, no abdominal pain, cramping or bloating.  She had decreased p.o. intake over the last two days.  She denied any chest  pain, shortness of breath, dysuria, diaphoresis or dizziness.  She denied  any sick contact or recent travel.  She did eat at [facility] on the day  prior to admission.  Admission labs showed a white count of 6.0, hemoglobin  12.5, hematocrit 36.7, platelet count 195,000.  Her electrolytes were all  within normal limits.   #1 - PRESUMED INFECTIOUS ENTERITIS WITH GIVEN HISTORY OF COPIOUS AMOUNTS OF  WATERY, BLOODY DIARRHEA:  Stool studies were obtained showing a negative  Rotavirus, negative Shigella toxin; fecal white cells were negative; C.  difficile toxin was also negative.  Stool cultures were reincubated for  better growth and final results are pending.  During her hospital  stay, the  patient developed no abdominal pain, cramping or bloating.  She complained  of urgency, but lacked any stool incontinence.  Prior to her discharge home,  she has had this illness for approximately six days and frequency of  diarrhea is slowing down.  Her stools are still loose and watery.  Blood  cultures remain negative.  The patient was treated empirically for the  diagnosis of infectious colitis with norfloxacin and has received three  days' worth prior to her discharge home.  She will complete a six-day course  total.  Due to her allergy to ciprofloxacin, she is being treated with  Claritin at the same time as her norfloxacin.   #2 - DEHYDRATION SECONDARY TO HER INFECTIOUS COLITIS:  The patient received  normal saline boluses in the emergency department and then was run at 150 mL  an hour.  The patient was able to keep up with her oral intake and tolerated  clear fluids without any difficulty.  The patient is instructed on  rehydration upon discharge home.  She did receive Rehydralyte as well as her  IV fluids in the  hospital.   #3 - ANEMIA:  During her hospital course, she was fecal-occult-positive.  Her hemoglobin decreased from 12 to 10, with a normal MCV.  Her blood  pressure remained stable and she was ruled out for orthostatic hypotension.  A followup CBC is recommended, as her last hemoglobin obtained was 10, and  this was on Apr 14, 2003.   #4 - HYPERTENSION:  The patient's Atacand was held during her hospital stay.  Her blood pressure ran mildly elevated in the 140s/60s.  The patient may be  placed back on Atacand at discharge, as she is drinking better.   #5 - GASTROESOPHAGEAL REFLUX DISEASE:  The patient received Protonix 40 mg  daily and did not complain of much nausea.  She required only several doses  of p.r.n. Phenergan.   #6 - HYPERLIPIDEMIA:  The patient will remain on her 5 mg of Lipitor at  bedtime.   FOLLOWUP ITEMS:  Followup items should include:  1. Final stool cultures, as they have been reincubated.  2. Follow up her rehydration status, as the patient admitted to taking     p.r.n. diuretic over the counter.  The patient is advised to discontinue     this and work on her rehydration and advance her diet as tolerated.     Francesca Jewett, D.O.                         Acquanetta Sit, M.D.    Georjean Mode  D:  04/15/2003  T:  04/16/2003  Job:  [id number]   cc:   [facility]

## 2011-04-21 NOTE — H&P (Signed)
NAME:  Elizabeth Burch, Elizabeth Burch                        ACCOUNT NO.:  [id number]   MEDICAL RECORD NO.:  [id number]                   PATIENT TYPE:  INP   LOCATION:  5152                                 FACILITY:  [facility]   PHYSICIAN:  Blane Ohara McDiarmid, M.D.             DATE OF BIRTH:  [birth year]   DATE OF ADMISSION:  04/12/2003  DATE OF DISCHARGE:                                HISTORY & PHYSICAL   PRIMARY CARE PHYSICIAN:  Dr. Chipper Herb of [facility].   CHIEF COMPLAINT:  Fever with diarrhea.   HISTORY OF PRESENT ILLNESS:  The patient is a 77 year old Caucasian female  presenting with a two- to three-day history of diarrhea and fever.  The  patient developed chills and diarrhea approximately two to three days prior  to presentation, positive fever with a T-max of 101.0 at home, which the  patient treated with Tylenol.  The patient developed watery diarrhea and  averaged approximately one stool per hour for the past two to three days,  non-mucus, positive blood-typed diarrhea since in the emergency department.  Positive nausea, yet no vomiting, no abdominal pain or cramping, decreased  oral intake over the past two days; the patient had ingested toast and 2  crackers on day prior to presentation, a total of 16 ounces of fluid on day  prior to presentation, approximately 8 ounces of fluid on day of  presentation.  The patient has had 5 to 10 stools during the ED visit, which  have been small to moderate amounts.  No chest pain.  No shortness of  breath.  No dysuria, no diaphoresis, no dizziness, no sick contact. no  recent travels.  The patient did eat at [facility] on the day prior to  initiation of symptomatology and ate a fish sandwich.  The patient denies  any symptoms of herniae.   PAST MEDICAL HISTORY:  1. Hypertension.  2. Dyslipidemia.  3. GERD, status post EGD in March of 2004.  4. Diverticulosis, status post colonoscopy in October of 2003.  5. Glaucoma.   PAST  SURGICAL HISTORY:  1. C-section approximately 53 years ago.  2. Breast biopsy.  3. Tonsillectomy.   ALLERGIES:  PENICILLIN, MACRODANTIN, CIPRO and SULFA.   MEDICATIONS:  1. Atacand 32 mg p.o. daily.  2. Protonix 40 mg p.o. daily.  3. Lipitor 10 mg one-half tablet p.o. daily.  4. Aspirin 325 mg p.o. daily.  5. Trusopt eye drops -- one drop, each eye, twice daily.  6. Timolol one drop to eye twice daily.  7. Citrucel daily.  8. Centrum Silver multivitamin p.o. daily.   SOCIAL HISTORY:  The patient is married for the past 80 years.  She lives in  [place] with her husband.  She has one son and two granddaughters.  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  She performs all her ADLs including  driving and she exercises five  times a week.   FAMILY HISTORY:  She has two sisters with breast cancer and mother that  passed and had endometrial cancer.  She has 10 siblings and there is no  premature coronary artery disease.   REVIEW OF SYSTEMS:  No headaches, no cough, no nasal congestion, no  rhinorrhea, no weight changes.   PHYSICAL EXAMINATION:  VITALS:  Temperature of 97.4, pulse of 70, BP of  156/55, O2 saturation 98%, respirations are 22.  GENERAL:  Well-developed, elderly female in no acute distress.  She is alert  and oriented x3.  HEENT:  Mucous membranes are dry.  Dentures are in place.  Pupils are equal,  round and reactive to light and sclerae are nonicteric.  NECK:  Neck is supple without lymphadenopathy.  No thyromegaly.  CARDIOVASCULAR:  Regular rate and rhythm.  No murmurs, gallops or rubs.  LUNGS:  Lungs are clear to auscultation bilaterally.  ABDOMEN:  Abdomen is soft, nontender and nondistended.  Positive bowel  sounds, normoactive.  No hepatosplenomegaly.  Positive very small reducible  hernia in the superior location of the umbilicus without evidence of  incarceration.  EXTREMITIES:  No clubbing, cyanosis, or  edema.  NEUROLOGICAL:  Cranial nerves II-XII are intact.  Motor is 5/5 throughout.  Sensation intact throughout.  SKIN:  Skin is warm and dry without rashes.  RECTAL:  Good sphincter tone, grossly heme-negative.  No impaction of stool.   LABORATORY DATA:  White blood cell 6.0, hemoglobin 12.5, hematocrit 36.7,  platelets 195,000; neutrophils 89% with an absolute count of 5.4,  lymphocytes 8% with absolute count of 0.5.  Sodium 136, potassium 3.9,  chloride 104, bicarb 22, BUN 28, creatinine 1.0, glucose 103.   IMPRESSION AND PLAN:  1. Diarrhea with fever:  Differential diagnosis includes viral     gastroenteritis, bacterial infection, diverticulitis, colonic     obstruction, urinary tract infection, pancreatitis.  We will check stool     cultures, Rotavirus cultures and blood cultures, in addition to urine     cultures.  Would also check a KUB to rule out obstruction.  There is     currently no abdominal pain that would suggest obstruction, pancreatitis     or diverticulitis.  2. Hypertension.  We will continue Atacand therapy during hospitalization;     however, monitor for dehydration.  3. Dehydration, mild:  We will continue intravenous fluids at maintenance.     We will also check orthostatics in the morning and continue a clear     liquid diet.  4. Gastroesophageal reflux disease:  We will continue Protonix for now.  5. Diverticulosis, diagnosed by colonoscopy in October of 2003:  This places     the patient at increased risk of diverticulitis, however, no current     indication of such.  6. Glaucoma:  We will continue her home medicines.  7.     Dyslipidemia:  I will hold Lipitor during hospitalization while she is     having gastrointestinal upset and restart that at discharge.  8. Disposition:  I anticipate a short hospitalization.     Reginia Forts, M.D.                        Acquanetta Sit, M.D.   KS/MEDQ  D:  04/13/2003  T:  04/14/2003  Job:  [id number]   cc:    Chipper Herb, M.D.  [address] [place].  [place]  [place] [address]  Fax: [phone]

## 2011-05-10 ENCOUNTER — Other Ambulatory Visit: Payer: Self-pay | Admitting: Cardiology

## 2011-06-15 ENCOUNTER — Other Ambulatory Visit: Payer: Self-pay | Admitting: Cardiology

## 2011-07-25 ENCOUNTER — Encounter: Payer: Self-pay | Admitting: Cardiology

## 2011-08-09 ENCOUNTER — Other Ambulatory Visit: Payer: Self-pay | Admitting: Family Medicine

## 2011-08-09 ENCOUNTER — Ambulatory Visit
Admission: RE | Admit: 2011-08-09 | Discharge: 2011-08-09 | Disposition: A | Discharge status: Home / Self Care | Payer: Medicare Other | Source: Ambulatory Visit | Attending: Family Medicine | Admitting: Family Medicine

## 2011-08-09 DIAGNOSIS — M79669 Pain in unspecified lower leg: Secondary | ICD-10-CM

## 2011-08-23 ENCOUNTER — Encounter: Payer: Self-pay | Admitting: Infectious Disease

## 2011-08-23 ENCOUNTER — Ambulatory Visit (INDEPENDENT_AMBULATORY_CARE_PROVIDER_SITE_OTHER): Payer: Medicare Other | Admitting: Infectious Disease

## 2011-08-23 VITALS — BP 182/54 | HR 76 | Temp 98.1°F | Wt 140.0 lb

## 2011-08-23 DIAGNOSIS — H4010X0 Unspecified open-angle glaucoma, stage unspecified: Secondary | ICD-10-CM | POA: Insufficient documentation

## 2011-08-23 DIAGNOSIS — L03119 Cellulitis of unspecified part of limb: Secondary | ICD-10-CM

## 2011-08-23 DIAGNOSIS — L02419 Cutaneous abscess of limb, unspecified: Secondary | ICD-10-CM

## 2011-08-23 DIAGNOSIS — N6019 Diffuse cystic mastopathy of unspecified breast: Secondary | ICD-10-CM | POA: Insufficient documentation

## 2011-08-23 DIAGNOSIS — R002 Palpitations: Secondary | ICD-10-CM | POA: Insufficient documentation

## 2011-08-23 MED ORDER — CLINDAMYCIN HCL 300 MG PO CAPS: 300.0000 mg | ORAL_CAPSULE | Freq: Four times a day (QID) | ORAL | Status: AC

## 2011-08-23 MED ORDER — DIPHENOXYLATE-ATROPINE 2.5-0.025 MG PO TABS: 1.0000 | ORAL_TABLET | Freq: Four times a day (QID) | ORAL | Status: DC | PRN

## 2011-08-23 NOTE — Patient Instructions (Signed)
Try this clindamycin If you get diarrhea right away with this then start the lomotil to try to control it  If the diarrhea develops later, let Dr Tommy Medal know

## 2011-08-23 NOTE — Progress Notes (Signed)
  Subjective:    Patient ID: Elizabeth Burch, female    DOB: 03/04/1926, 77 y.o.   MRN: [id number]  HPI  77 year old lady who developed erythema on her posterior left calf approximately one month ago. The area was approximatley one to two cm in dimensions. It has since grown to at least 5cm in greatest dimensions and is isolated to her proximal calf. She has lacked any fever, nausea, malaise or nausea or vomiting or systemic symptoms. Her PCP tried keflex (despite PCN allergy) and developed profuse diarrhea and then stopped this. She was then started on doxyycline this past Friday without much improvement yet. She was worked in as an urgent consult on behalf of her PCP. She has noteworthy allergies to PCN though she cannot recall the type of reaction. Also allergy to cipro, bactrim but again unknown. She had severe anaphylactic reaction to macrobid. She has not been bathing in sea water, freshwater or a hot tubs. She has no pets. She knows of no trauma to the area.    Review of Systems  Constitutional: Negative for fever, chills, diaphoresis, activity change, appetite change, fatigue and unexpected weight change.  HENT: Negative for congestion, sore throat, rhinorrhea, sneezing, trouble swallowing and sinus pressure.   Eyes: Negative for photophobia and visual disturbance.  Respiratory: Negative for cough, chest tightness, shortness of breath, wheezing and stridor.   Cardiovascular: Negative for chest pain, palpitations and leg swelling.  Gastrointestinal: Negative for nausea, vomiting, abdominal pain, diarrhea, constipation, blood in stool, abdominal distention and anal bleeding.  Genitourinary: Negative for dysuria, hematuria, flank pain and difficulty urinating.  Musculoskeletal: Positive for arthralgias. Negative for myalgias, back pain, joint swelling and gait problem.  Skin: Positive for color change. Negative for pallor, rash and wound.  Neurological: Negative for dizziness, tremors,  weakness and light-headedness.  Hematological: Negative for adenopathy. Does not bruise/bleed easily.  Psychiatric/Behavioral: Negative for behavioral problems, confusion, sleep disturbance, dysphoric mood, decreased concentration and agitation.       Objective:   Physical Exam  Constitutional: She is oriented to person, place, and time. She appears well-developed and well-nourished. No distress.  HENT:  Head: Normocephalic and atraumatic.  Mouth/Throat: Oropharynx is clear and moist. No oropharyngeal exudate.  Eyes: Conjunctivae and EOM are normal. Pupils are equal, round, and reactive to light. No scleral icterus.  Neck: Normal range of motion. Neck supple. No JVD present.  Cardiovascular: Normal rate, regular rhythm and normal heart sounds.  Exam reveals no gallop and no friction rub.   No murmur heard. Pulmonary/Chest: Effort normal and breath sounds normal. No respiratory distress. She has no wheezes. She has no rales. She exhibits no tenderness.  Abdominal: She exhibits no distension and no mass. There is no tenderness. There is no rebound and no guarding.  Musculoskeletal: She exhibits no edema and no tenderness.  Lymphadenopathy:    She has no cervical adenopathy.  Neurological: She is alert and oriented to person, place, and time. She has normal reflexes. She exhibits normal muscle tone. Coordination normal.  Skin: Skin is warm and dry. No rash noted. She is not diaphoretic. There is erythema. No pallor.          Tenderness around the posterior calf  Psychiatric: She has a normal mood and affect. Her behavior is normal. Judgment and thought content normal.          Assessment & Plan:

## 2011-08-23 NOTE — Assessment & Plan Note (Signed)
A bit strange for a patient for to have a bacterial pyogenic cellulitis that would smolder this long. I will give her clindamycin to see if this improves her cellulitis. If she cannot tolerate due to diarrhea could try to give her zyvox if medicare willl cover most of it. If she fails antimicrobial therapy I would push for biopsy off of antibiotics for path, fungal and afb

## 2011-08-24 LAB — CBC WITH DIFFERENTIAL/PLATELET
Basophils Absolute: 0 10*3/uL (ref 0.0–0.1)
HCT: 34.9 % — ABNORMAL LOW (ref 36.0–46.0)
Hemoglobin: 11.2 g/dL — ABNORMAL LOW (ref 12.0–15.0)
Lymphocytes Relative: 33 % (ref 12–46)
Monocytes Absolute: 0.6 10*3/uL (ref 0.1–1.0)
Monocytes Relative: 8 % (ref 3–12)
Neutro Abs: 4.2 10*3/uL (ref 1.7–7.7)
Neutrophils Relative %: 57 % (ref 43–77)
WBC: 7.4 10*3/uL (ref 4.0–10.5)

## 2011-08-24 LAB — BASIC METABOLIC PANEL WITH GFR
BUN: 40 mg/dL — ABNORMAL HIGH (ref 6–23)
Chloride: 108 mEq/L (ref 96–112)
GFR, Est African American: 34 mL/min — ABNORMAL LOW (ref >60)
GFR, Est Non African American: 28 mL/min — ABNORMAL LOW (ref >60)
Potassium: 4.4 mEq/L (ref 3.5–5.3)
Sodium: 144 mEq/L (ref 135–145)

## 2011-08-28 ENCOUNTER — Telehealth: Payer: Self-pay | Admitting: *Deleted

## 2011-08-28 NOTE — Telephone Encounter (Signed)
I would have her continue the antibiotic. Is her leg worse? As long as it is not worse I would not push to change things around at this point.

## 2011-08-28 NOTE — Telephone Encounter (Signed)
States her leg is not improved. Not worse. Tolerating med well. Wants to know if she should come back in or take a different med. Told her I will relay this to md. Told her it may take more than 4-5 days to start seeing an improvement. She does elevate her leg when sitting. Has not made her f/u appt yet. Urged her to make one now as her md has few appts in near future. States she will call back.

## 2011-08-28 NOTE — Telephone Encounter (Signed)
Told pt what md wrote. She was satisfied with answer & will call if it starts to worsen

## 2011-08-30 ENCOUNTER — Telehealth: Payer: Self-pay | Admitting: *Deleted

## 2011-08-30 ENCOUNTER — Emergency Department (HOSPITAL_COMMUNITY)
Admission: EM | Admit: 2011-08-30 | Discharge: 2011-08-30 | Disposition: A | Discharge status: Home / Self Care | Payer: Medicare Other | Attending: Emergency Medicine | Admitting: Emergency Medicine

## 2011-08-30 ENCOUNTER — Telehealth: Payer: Self-pay | Admitting: Infectious Disease

## 2011-08-30 DIAGNOSIS — M7989 Other specified soft tissue disorders: Secondary | ICD-10-CM | POA: Insufficient documentation

## 2011-08-30 DIAGNOSIS — R21 Rash and other nonspecific skin eruption: Secondary | ICD-10-CM | POA: Insufficient documentation

## 2011-08-30 DIAGNOSIS — R609 Edema, unspecified: Secondary | ICD-10-CM | POA: Insufficient documentation

## 2011-08-30 DIAGNOSIS — Z79899 Other long term (current) drug therapy: Secondary | ICD-10-CM | POA: Insufficient documentation

## 2011-08-30 DIAGNOSIS — E7800 Pure hypercholesterolemia, unspecified: Secondary | ICD-10-CM | POA: Insufficient documentation

## 2011-08-30 DIAGNOSIS — Z8639 Personal history of other endocrine, nutritional and metabolic disease: Secondary | ICD-10-CM | POA: Insufficient documentation

## 2011-08-30 DIAGNOSIS — K219 Gastro-esophageal reflux disease without esophagitis: Secondary | ICD-10-CM | POA: Insufficient documentation

## 2011-08-30 DIAGNOSIS — Z862 Personal history of diseases of the blood and blood-forming organs and certain disorders involving the immune mechanism: Secondary | ICD-10-CM | POA: Insufficient documentation

## 2011-08-30 DIAGNOSIS — I10 Essential (primary) hypertension: Secondary | ICD-10-CM | POA: Insufficient documentation

## 2011-08-30 DIAGNOSIS — N289 Disorder of kidney and ureter, unspecified: Secondary | ICD-10-CM | POA: Insufficient documentation

## 2011-08-30 DIAGNOSIS — D649 Anemia, unspecified: Secondary | ICD-10-CM | POA: Insufficient documentation

## 2011-08-30 LAB — POCT I-STAT, CHEM 8
BUN: 35 mg/dL — ABNORMAL HIGH (ref 6–23)
Calcium, Ion: 1.36 mmol/L — ABNORMAL HIGH (ref 1.12–1.32)
Chloride: 111 mEq/L (ref 96–112)
Creatinine, Ser: 1.8 mg/dL — ABNORMAL HIGH (ref 0.50–1.10)
Glucose, Bld: 98 mg/dL (ref 70–99)
Potassium: 4.4 mEq/L (ref 3.5–5.1)

## 2011-08-30 LAB — CBC
HCT: 31.4 % — ABNORMAL LOW (ref 36.0–46.0)
Hemoglobin: 10.5 g/dL — ABNORMAL LOW (ref 12.0–15.0)
MCV: 92.6 fL (ref 78.0–100.0)
RBC: 3.39 MIL/uL — ABNORMAL LOW (ref 3.87–5.11)
RDW: 13.4 % (ref 11.5–15.5)
WBC: 7.5 10*3/uL (ref 4.0–10.5)

## 2011-08-30 LAB — DIFFERENTIAL
Basophils Absolute: 0 10*3/uL (ref 0.0–0.1)
Eosinophils Relative: 2 % (ref 0–5)
Lymphocytes Relative: 21 % (ref 12–46)
Lymphs Abs: 1.6 10*3/uL (ref 0.7–4.0)
Neutro Abs: 5.1 10*3/uL (ref 1.7–7.7)

## 2011-08-30 NOTE — Telephone Encounter (Signed)
States the leg is worse-more red & hard & spreading. Now has similar symptoms on other leg . Spoke with front desk. We have no appts today or tomorrow. No md present Friday. Sent her to ED. She agreed with this & will go. States she is taking the antibiotic

## 2011-08-30 NOTE — Telephone Encounter (Signed)
Erythema on calf no better now has site at other leg. Seen in ED by Dr> Jacubowitz. Will stop clinda. Bring back next Wed at [facility] can you put er on scheduel for n3ext wed and dc her other appt?

## 2011-09-05 ENCOUNTER — Other Ambulatory Visit (INDEPENDENT_AMBULATORY_CARE_PROVIDER_SITE_OTHER): Payer: Self-pay | Admitting: Surgery

## 2011-09-05 DIAGNOSIS — E042 Nontoxic multinodular goiter: Secondary | ICD-10-CM

## 2011-09-06 ENCOUNTER — Ambulatory Visit (INDEPENDENT_AMBULATORY_CARE_PROVIDER_SITE_OTHER): Payer: Medicare Other | Admitting: Infectious Disease

## 2011-09-06 ENCOUNTER — Encounter: Payer: Self-pay | Admitting: Infectious Disease

## 2011-09-06 DIAGNOSIS — L270 Generalized skin eruption due to drugs and medicaments taken internally: Secondary | ICD-10-CM

## 2011-09-06 DIAGNOSIS — I872 Venous insufficiency (chronic) (peripheral): Secondary | ICD-10-CM

## 2011-09-06 DIAGNOSIS — L02419 Cutaneous abscess of limb, unspecified: Secondary | ICD-10-CM

## 2011-09-06 DIAGNOSIS — I8310 Varicose veins of unspecified lower extremity with inflammation: Secondary | ICD-10-CM

## 2011-09-06 DIAGNOSIS — L03119 Cellulitis of unspecified part of limb: Secondary | ICD-10-CM

## 2011-09-06 NOTE — Patient Instructions (Signed)
I would talk to your primary care MD regarding idea of using furosemide (lasix) in place of the chlorthalidone if idea is to speed up treating the edema

## 2011-09-06 NOTE — Assessment & Plan Note (Signed)
This is clearly not due to clindamycin. I will defer to his dermatologist and her PCP

## 2011-09-06 NOTE — Progress Notes (Signed)
Subjective:    Patient ID: Elizabeth Burch, female    DOB: 03-13-1926, 77 y.o.   MRN: [id number]  HPI  77 year old lady who developed erythema on her posterior left calf more than a month. The area was approximatley one to two cm in dimensions. It had since grown to at least 5cm in greatest dimensions and is isolated to her proximal calf. She has lacked any fever, nausea, malaise or nausea or vomiting or systemic symptoms. Her PCP tried keflex (despite PCN allergy) and developed profuse diarrhea and then stopped this. She was then started on doxyycline this past Friday without much improvement yet. She was worked in as an urgent consult on behalf of her PCP. She has noteworthy allergies to PCN though she cannot recall the type of reaction. Also allergy to cipro, bactrim but again unknown. She had severe anaphylactic reaction to macrobid. I have prescribed her clindamycin which she took without improvement in erythema in her leg. She was seen in the ED with worsening renal funciton. I discussed her case with the ED MD and pts erythema had not worsened or improved.  I recommended stopping the clindamycin. The patients erythema did not worsen off antibiotics. She had a diffuse rash that erupted over the weekend when she was off clindamycin. She still seems to think that this could be due to the clndamycin that is clearly out of her system. I pointed out that this is highly unlikely and more likely due to one of her current medications or some other allergen. She has seen her dermatologist who believes her erythema in her leg is due to stasis dermatitis and she recommended further diuretic use and topical agent. We had contemplated biopsy but patient would like to go ahead and try topical agnet and diuretic and I agree with this plan.   Review of Systems  Constitutional: Negative for fever, chills, diaphoresis, activity change, appetite change, fatigue and unexpected weight change.  HENT: Negative for  congestion, sore throat, rhinorrhea, sneezing, trouble swallowing and sinus pressure.   Eyes: Negative for photophobia and visual disturbance.  Respiratory: Negative for cough, chest tightness, shortness of breath, wheezing and stridor.   Cardiovascular: Negative for chest pain, palpitations and leg swelling.  Gastrointestinal: Negative for nausea, vomiting, abdominal pain, diarrhea, constipation, blood in stool, abdominal distention and anal bleeding.  Genitourinary: Negative for dysuria, hematuria, flank pain and difficulty urinating.  Musculoskeletal: Negative for myalgias, back pain, joint swelling, arthralgias and gait problem.  Skin: Negative for color change, pallor, rash and wound.  Neurological: Negative for dizziness, tremors, weakness and light-headedness.  Hematological: Negative for adenopathy. Does not bruise/bleed easily.  Psychiatric/Behavioral: Negative for behavioral problems, confusion, sleep disturbance, dysphoric mood, decreased concentration and agitation.       Objective:   Physical Exam  Constitutional: She is oriented to person, place, and time. She appears well-developed and well-nourished. No distress.  HENT:  Head: Normocephalic and atraumatic.  Mouth/Throat: Oropharyngeal exudate present.  Eyes: Conjunctivae and EOM are normal. Pupils are equal, round, and reactive to light. No scleral icterus.  Neck: Normal range of motion. Neck supple. No JVD present.  Cardiovascular: Normal rate, regular rhythm and normal heart sounds.  Exam reveals no gallop and no friction rub.   No murmur heard. Pulmonary/Chest: Effort normal and breath sounds normal. No respiratory distress. She has no wheezes. She has no rales. She exhibits no tenderness.  Abdominal: She exhibits no distension and no mass. There is no tenderness. There is no rebound and  no guarding.  Musculoskeletal: She exhibits no edema and no tenderness.  Lymphadenopathy:    She has no cervical adenopathy.    Neurological: She is alert and oriented to person, place, and time. She has normal reflexes. She exhibits normal muscle tone. Coordination normal.  Skin: Skin is warm and dry. She is not diaphoretic. No erythema. No pallor.  Psychiatric: She has a normal mood and affect. Her behavior is normal. Judgment and thought content normal.          Assessment & Plan:  Cellulitis, leg I really dont think this is cellulitis. I am willing to biopsy this lesion in case this is an atypical infeciton such as an atypical mycobacteria or fungal infeciton  Stasis dermatitis I agree this is a likely cause of her erythema  Drug rash This is clearly not due to clindamycin. I will defer to his dermatologist and her PCP

## 2011-09-06 NOTE — Assessment & Plan Note (Signed)
I really dont think this is cellulitis. I am willing to biopsy this lesion in case this is an atypical infeciton such as an atypical mycobacteria or fungal infeciton

## 2011-09-06 NOTE — Assessment & Plan Note (Signed)
I agree this is a likely cause of her erythema

## 2011-09-07 ENCOUNTER — Encounter: Payer: Self-pay | Admitting: Cardiology

## 2011-10-09 ENCOUNTER — Ambulatory Visit: Payer: Medicare Other | Admitting: Infectious Disease

## 2011-10-11 ENCOUNTER — Other Ambulatory Visit: Payer: Medicare Other

## 2011-10-11 ENCOUNTER — Ambulatory Visit
Admission: RE | Admit: 2011-10-11 | Discharge: 2011-10-11 | Disposition: A | Discharge status: Home / Self Care | Payer: Medicare Other | Source: Ambulatory Visit | Attending: Surgery | Admitting: Surgery

## 2011-10-11 DIAGNOSIS — E042 Nontoxic multinodular goiter: Secondary | ICD-10-CM

## 2011-10-23 ENCOUNTER — Encounter (INDEPENDENT_AMBULATORY_CARE_PROVIDER_SITE_OTHER): Payer: Self-pay

## 2011-10-25 ENCOUNTER — Ambulatory Visit (INDEPENDENT_AMBULATORY_CARE_PROVIDER_SITE_OTHER): Payer: Medicare Other | Admitting: Surgery

## 2011-10-25 ENCOUNTER — Encounter (INDEPENDENT_AMBULATORY_CARE_PROVIDER_SITE_OTHER): Payer: Self-pay | Admitting: Surgery

## 2011-10-25 VITALS — BP 162/64 | HR 64 | Temp 97.4°F | Resp 16 | Ht 63.0 in | Wt 140.2 lb

## 2011-10-25 DIAGNOSIS — E042 Nontoxic multinodular goiter: Secondary | ICD-10-CM

## 2011-10-25 NOTE — Progress Notes (Signed)
Boaz, MD,  [address]  [place], [place]    [place] Phone:  [phone] FAX:  [phone]   Re:   Elizabeth Burch DOB:   24-Mar-1926 MRN:   [id number]  ASSESSMENT AND PLAN: 1.  Breast mastopathy  I usually check her in April.  I did not examine her today and she has noticed no change.   I will see back in April, 2013, after her mammogram, to recheck breast.  2.  Multinodular thyroid gland.  Normal thyroid function.  [place] unchanged on  10/11/2011.  Stable.  No further [place] unless there is a change in her exam.  3.  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  Taken care of by Dr[facility] Rigoberto Noel at [facility]  4.  Hypertension.  HISTORY OF PRESENT ILLNESS: Chief Complaint  Patient presents with  . Follow-up    LTF on thyroid    Elizabeth Burch is a 77 y.o. (DOB: 06/12/1926)  white female who is a patient of Clois Dupes, MD, MD and comes to me today for check thyroid.  I follow the patient for diffuse mastopathy. She had 3 sisters who had breast cancer. She originally was followed by Dr. Gretta Cool.  In September 2011, she fell and broke T6. This was managed at [facility]. Because of questionable syncope, she had an ultrasound that showed a multinodular thyroid gland. Her thyroid tests on 09 November 2010 showed normal TSH and normal thyroid functions.  She had a repeat ultrasound on 28 March 2011 that showed bilateral thyroid nodules. She had another repeat ultrasound of her thyroid on 11 October 2011 which showed stable bilateral thyroid nodules.  There is no suspicious nodule.  The patient has noticed no change in her neck or new complaints. PHYSICAL EXAM: BP 162/64  Pulse 64  Temp(Src) 97.4 F (36.3 C) (Temporal)  Resp 16  Ht 5\' 3"  (1.6 m)  Wt 140 lb 3.2 oz (63.594 kg)  BMI 24.84 kg/m2  Neck:  No specific mass in thyroid, but lumpy. Lymphatics:  No cervical or supraclav nodes. Lungs:  Clear Heart:   RRR.  No murmur.  DATA REVIEWED: Thyroid US.  Copy to patient.  Alphonsa Overall, MD, [place] Office:  [phone]

## 2011-10-31 ENCOUNTER — Encounter: Payer: Self-pay | Admitting: Cardiology

## 2011-11-02 ENCOUNTER — Encounter: Payer: Self-pay | Admitting: Cardiology

## 2011-11-30 ENCOUNTER — Other Ambulatory Visit: Payer: Self-pay | Admitting: Cardiology

## 2012-01-03 ENCOUNTER — Encounter: Payer: Self-pay | Admitting: Cardiology

## 2012-01-08 ENCOUNTER — Ambulatory Visit (INDEPENDENT_AMBULATORY_CARE_PROVIDER_SITE_OTHER): Payer: Medicare Other | Admitting: Cardiology

## 2012-01-08 ENCOUNTER — Encounter: Payer: Self-pay | Admitting: Cardiology

## 2012-01-08 DIAGNOSIS — E785 Hyperlipidemia, unspecified: Secondary | ICD-10-CM

## 2012-01-08 DIAGNOSIS — N189 Chronic kidney disease, unspecified: Secondary | ICD-10-CM

## 2012-01-08 DIAGNOSIS — I10 Essential (primary) hypertension: Secondary | ICD-10-CM

## 2012-01-08 DIAGNOSIS — N184 Chronic kidney disease, stage 4 (severe): Secondary | ICD-10-CM | POA: Insufficient documentation

## 2012-01-08 DIAGNOSIS — R002 Palpitations: Secondary | ICD-10-CM

## 2012-01-08 NOTE — Progress Notes (Signed)
PCP: Dr. Laurance Flatten  78 yo with history of HTN and palpitations returns for cardiology followup. Since starting Toprol XL, her palpitations have considerably decreased. BP is under good control.  No exertional chest pain or dyspnea. She continues to walk 1-2 miles a day with no problems.  Last echo showed preserved systolic function and carotid dopplers showed no significant disease. She is following with Dr. Mercy Moore for assessment of CKD.  Main problem lately has been gout in her right hand. This has been improving with colchicine and prednisone.   Labs (7/10): K 5.7, creatinine 1.44  Labs (11/10): K 4.6, creatinine 1.38  Labs (9/11): HDL 96, LDL 69, creatinine  Labs (12/11): TSH normal  Labs (11/12): K 4.5, creatinine 1.98  ECG: NSR, normal   Allergies (verified):  1) ! Macrodantin  2) ! Cipro  3) ! Pcn  4) ! Biaxin  5) ! Fosamax  6) ! Ace Inhibitors  7) ! Floxin  8) ! Sulfa  9) Sulfamethoxazole (Sulfamethoxazole)  10) Penicillin V Potassium (Penicillin V Potassium)   Past Medical History:  1. Hypertension.  2. Hyperlipidemia.  3. Gastroesophageal reflux disease.  4. Fibrocystic breast disease,.  5. History of colon polyps.  6. Palpitations. The patient did have a Holter monitor done in January 2010, showing quite frequent PACs, but no SVT or no other serious arrhythmia. 3 week event monitor in 9/11 showed mild sinus bradycardia to the 50s at times, otherwise unremarkable.  7. Echocardiogram done in January 2010 showed an EF of 70%, normal LV size, no regional Ciesla motion abnormalities. The valves look normal. There is mild pulmonary hypertension with pulmonary artery systolic pressure of 40 mmHg. Echo (10/11): Mild focal basal septal hypertrophy, EF 123456, grade I diastolic dysfunction, PA systolic pressure 31 mmHg, borderline atrial septal aneurysm.  8. CKD with mild hyperkalemia  9. Carotid dopplers (10/11): normal.  10. Multinodular goiter 11. Gout  Family History:  The  patient's brother had an MI in his 61s.   Social History:  Married, lives in [place].  Tobacco Use - No.  Alcohol Use - no  Current Outpatient Prescriptions  Medication Sig Dispense Refill  . allopurinol (ZYLOPRIM) 100 MG tablet Take 100 mg by mouth daily.       Marland Kitchen aspirin EC 81 MG tablet Take 81 mg by mouth daily.      Marland Kitchen COLCRYS 0.6 MG tablet Take 0.6 mg by mouth daily.       . furosemide (LASIX) 40 MG tablet daily.      Marland Kitchen latanoprost (XALATAN) 0.005 % ophthalmic solution       . NORVASC 5 MG tablet TAKE 1 TABLET DAILY  30 each  3  . pantoprazole (PROTONIX) 40 MG tablet       . predniSONE (DELTASONE) 10 MG tablet Take 10 mg by mouth as directed. Taper dose pak      . TOPROL XL 25 MG 24 hr tablet TAKE 1 TABLET DAILY  30 each  8  . valsartan (DIOVAN) 320 MG tablet Take 320 mg by mouth daily.        . diphenoxylate-atropine (LOMOTIL) 2.5-0.025 MG per tablet Take 1 tablet by mouth 4 (four) times daily as needed for diarrhea/loose stools.  30 tablet  1  . febuxostat (ULORIC) 40 MG tablet Take 80 mg by mouth daily.        . fluocinonide cream (LIDEX) 0.05 % daily.        BP 140/65  Pulse 58  Ht 5'  3" (1.6 m)  Wt 62.596 kg (138 lb)  BMI 24.45 kg/m2 General: NAD Neck: No JVD, no thyromegaly or thyroid nodule.  Lungs: Clear to auscultation bilaterally with normal respiratory effort. CV: Nondisplaced PMI.  Heart regular S1/S2, no S3/S4, 1/6 early SEM RUSB.  No peripheral edema.  No carotid bruit.  Normal pedal pulses.  Abdomen: Soft, nontender, no hepatosplenomegaly, no distention.  Neurologic: Alert and oriented x 3.  Psych: Normal affect. Extremities: No clubbing or cyanosis. Right 3rd DIP joint swollen and red c/w gout.

## 2012-01-08 NOTE — Assessment & Plan Note (Signed)
She is no longer on a statin (had been on Livalo).  This is being followed by Dr. Tawanna Sat office.

## 2012-01-08 NOTE — Assessment & Plan Note (Signed)
BP seems to be under good control.  Continue current meds.

## 2012-01-08 NOTE — Assessment & Plan Note (Signed)
No symptomatic tachypalpitations on Toprol XL.

## 2012-01-08 NOTE — Assessment & Plan Note (Signed)
Slow but steady progression.  She is on ARB.  She follows with Dr. Mercy Moore.

## 2012-01-08 NOTE — Patient Instructions (Addendum)
Your physician wants you to follow-up in: 1 year with Dr Aundra Dubin. (February 2014). You will receive a reminder letter in the mail two months in advance. If you don't receive a letter, please call our office to schedule the follow-up appointment.

## 2012-01-18 ENCOUNTER — Encounter (HOSPITAL_COMMUNITY): Payer: Self-pay | Admitting: Emergency Medicine

## 2012-01-18 ENCOUNTER — Emergency Department (HOSPITAL_COMMUNITY)
Admission: EM | Admit: 2012-01-18 | Discharge: 2012-01-18 | Disposition: A | Discharge status: Home / Self Care | Payer: Medicare Other | Attending: Emergency Medicine | Admitting: Emergency Medicine

## 2012-01-18 DIAGNOSIS — T504X5A Adverse effect of drugs affecting uric acid metabolism, initial encounter: Secondary | ICD-10-CM | POA: Insufficient documentation

## 2012-01-18 DIAGNOSIS — L270 Generalized skin eruption due to drugs and medicaments taken internally: Secondary | ICD-10-CM | POA: Insufficient documentation

## 2012-01-18 DIAGNOSIS — Z862 Personal history of diseases of the blood and blood-forming organs and certain disorders involving the immune mechanism: Secondary | ICD-10-CM | POA: Insufficient documentation

## 2012-01-18 DIAGNOSIS — I10 Essential (primary) hypertension: Secondary | ICD-10-CM | POA: Insufficient documentation

## 2012-01-18 DIAGNOSIS — L299 Pruritus, unspecified: Secondary | ICD-10-CM | POA: Insufficient documentation

## 2012-01-18 DIAGNOSIS — Z79899 Other long term (current) drug therapy: Secondary | ICD-10-CM | POA: Insufficient documentation

## 2012-01-18 DIAGNOSIS — Z8639 Personal history of other endocrine, nutritional and metabolic disease: Secondary | ICD-10-CM | POA: Insufficient documentation

## 2012-01-18 DIAGNOSIS — M109 Gout, unspecified: Secondary | ICD-10-CM | POA: Insufficient documentation

## 2012-01-18 DIAGNOSIS — R21 Rash and other nonspecific skin eruption: Secondary | ICD-10-CM

## 2012-01-18 MED ORDER — HYDROXYZINE HCL 25 MG PO TABS: 25.0000 mg | ORAL_TABLET | Freq: Four times a day (QID) | ORAL | Status: AC

## 2012-01-18 MED ORDER — PREDNISONE (PAK) 10 MG PO TABS: ORAL_TABLET | ORAL | Status: AC

## 2012-01-18 NOTE — ED Provider Notes (Signed)
Medical screening examination/treatment/procedure(s) were conducted as a shared visit with non-physician practitioner(s) and myself.  I personally evaluated the patient during the encounter  Pt seen and evaluated, lacy erythematous rash over arms, face.  No lip/tongue swelling no mucous membrane involvement or eye involvement.  Rash started after finishing course of steroids for gout- so I think it is reasonable to give her another course of steroids.  She has a dermatology appointment already scheduled for next week.   Threasa Beards, MD 01/18/12 [phone]

## 2012-01-18 NOTE — ED Notes (Signed)
Pt sts allergic reaction she thinks to allopurinol; pt sts started on Tuesday; pt noted to have redness to chest and face; pt sts some exertional SOB; pt sts had fever on Monday

## 2012-01-18 NOTE — ED Provider Notes (Signed)
History     CSN: [id number]  Arrival date & time 01/18/12  61   First MD Initiated Contact with Patient 01/18/12 2048      HPI Patient reports possible allergic reaction to allopurinol. States she was given allopurinol for gouty flare. Reports taking 8 days worth of allopurinol. Discontinued this 5 days ago when she began to develop erythematous mildly pruritic rash on her face. States since then rash is spread to her entire body. Denies wheezing, throat swelling regurgitation, shortness of breath. Denies any other change in lotions, soaps, detergents. States he followed up with her primary care physician today and she was given a steroid shot. States it has not been helping her symptoms yet. Reports she has an appointment with the dermatologist on Thursday, in one week.  Patient is a 78 y.o. female presenting with allergic reaction. The history is provided by the patient.  Allergic Reaction The primary symptoms are  rash. The primary symptoms do not include wheezing, shortness of breath, cough, nausea, vomiting, palpitations or urticaria. The current episode started more than 2 days ago. The problem has not changed since onset.This is a new problem.  Location: entire body. The pain associated with the rash is mild. The rash is associated with itching (only on face). The rash is not associated with blisters or weeping.  The onset of the reaction was associated with a new medication. Significant symptoms also include itching (only on face). Significant symptoms that are not present include eye redness or rhinorrhea.    Past Medical History  Diagnosis Date  . HTN (hypertension)   . Fibrocystic breast disease   . Palpitations   . Glaucoma   . Multiple thyroid nodules   . Stasis dermatitis   . Gout     Past Surgical History  Procedure Date  . Tonsillectomy   . Cesarean section   . Cystectomy     on colon    Family History  Problem Relation Age of Onset  . Cancer Mother     pt  Francene Boyers of the origin  . Cancer Sister     breast    History  Substance Use Topics  . Smoking status: Never Smoker   . Smokeless tobacco: Not on file  . Alcohol Use: No    OB History    Grav Para Term Preterm Abortions TAB SAB Ect Mult Living                  Review of Systems  Constitutional: Negative for fever and chills.  HENT: Negative for rhinorrhea.   Eyes: Negative for redness.  Respiratory: Negative for cough, shortness of breath and wheezing.   Cardiovascular: Negative for chest pain and palpitations.  Gastrointestinal: Negative for nausea and vomiting.  Skin: Positive for itching (only on face) and rash.       Pruritus  All other systems reviewed and are negative.    Allergies  Ace inhibitors; Alendronate sodium; Ciprofloxacin; Clarithromycin; Clindamycin/lincomycin; Nitrofurantoin; Ofloxacin; Penicillins; Sulfamethoxazole; and Sulfonamide derivatives  Home Medications   Current Outpatient Rx  Name Route Sig Dispense Refill  . ALLOPURINOL 100 MG PO TABS Oral Take 100 mg by mouth daily.     Marland Kitchen AMLODIPINE BESYLATE 5 MG PO TABS      . ASPIRIN EC 81 MG PO TBEC Oral Take 81 mg by mouth daily.    . FUROSEMIDE 40 MG PO TABS Oral Take 40 mg by mouth daily.     Marland Kitchen LATANOPROST 0.005 % OP SOLN  Both Eyes Place 1 drop into both eyes daily.     Marland Kitchen METOPROLOL SUCCINATE ER 25 MG PO TB24      . FISH OIL 1000 MG PO CAPS Oral Take 1,000 mg by mouth daily.    Marland Kitchen PANTOPRAZOLE SODIUM 40 MG PO TBEC Oral Take 40 mg by mouth daily.     Marland Kitchen PRESCRIPTION MEDICATION  Patient says dr. Hanley Seamen her a cortisone injection today at around 0930am today.    Marland Kitchen VALSARTAN 320 MG PO TABS Oral Take 160 mg by mouth daily.       BP 158/38  Pulse 89  Temp(Src) 97.5 F (36.4 C) (Oral)  Resp 22  SpO2 96%  Physical Exam  Vitals reviewed. Constitutional: She is oriented to person, place, and time. Vital signs are normal. She appears well-developed and well-nourished. No distress.  HENT:  Head:  Normocephalic and atraumatic.  Mouth/Throat: Oropharynx is clear and moist. No oropharyngeal exudate.  Eyes: Pupils are equal, round, and reactive to light.  Neck: Neck supple.  Cardiovascular: Normal rate and regular rhythm.   Pulmonary/Chest: Effort normal. She has no wheezes. She has no rales. She exhibits no tenderness.  Neurological: She is alert and oriented to person, place, and time.  Skin: Skin is warm and dry. Rash noted. No erythema. No pallor.       Patient has a reticulated erythematous rash on arms. Her face appears mildly erythematous and edematous. Rash is not weeping or draining. Rash is not raised.  Psychiatric: She has a normal mood and affect. Her behavior is normal.    ED Course  Procedures   MDM  Patient with Dr. Canary Brim. Patient is a received a steroid shot her primary care physician. Will however prescribe a steroid pack for her to take home as well as hydroxyzine. Patient has a followup appointment with dermatologist in one week. Advise oatmeal baths in lotions to help with itching. Advised immediate return to the emergency department for worsening symptoms such as shortness of breath or wheezing or throat tightening. Likely patient has reaction to allopurinol due to half-life being 1-2 hours. However does not appear to be life-threatening rash. Patient understands return for worsening symptoms and is ready for discharge       Sheliah Mends, Hershal Coria 01/18/12 2112

## 2012-01-18 NOTE — Discharge Instructions (Signed)
Allergic Reaction, Mild to Moderate   Allergies may happen from anything your body is sensitive to. This may be food, medications, pollens, chemicals, and nearly anything around you in everyday life that produces allergens. An allergen is anything that causes an allergy producing substance. Allergens cause your body to release allergic antibodies. Through a chain of events, they cause a release of histamine into the blood stream. Histamines are meant to protect you, but they also cause your discomfort. This is why antihistamines are often used for allergies. Heredity is often a factor in causing allergic reactions. This means you may have some of the same allergies as your parents.   Allergies happen in all age groups. You may have some idea of what caused your reaction. There are many allergens around us. It may be difficult to know what caused your reaction. If this is a first time event, it may never happen again. Allergies cannot be cured but can be controlled with medications.   SYMPTOMS   You may get some or all of the following problems from allergies.   Swelling and itching in and around the mouth.   Tearing, itchy eyes.   Nasal congestion and runny nose.   Sneezing and coughing.   An itchy red rash or hives.   Vomiting or diarrhea.   Difficulty breathing.  Seasonal allergies occur in all age groups. They are seasonal because they usually occur during the same season every year. They may be a reaction to molds, grass pollens, or tree pollens. Other causes of allergies are house dust mite allergens, pet dander and mold spores. These are just a common few of the thousands of allergens around us. All of the symptoms listed above happen when you come in contact with pollens and other allergens. Seasonal allergies are usually not life threatening. They are generally more of a nuisance that can often be handled using medications.   Hay fever is a combination of all or some of the above listed allergy problems. It  may often be treated with simple over-the-counter medications such as diphenhydramine. Take medication as directed. Check with your caregiver or package insert for child dosages.   TREATMENT AND HOME CARE INSTRUCTIONS   If hives or rash are present:   Take medications as directed.   You may use an over-the-counter antihistamine (diphenhydramine) for hives and itching as needed. Do not drive or drink alcohol until medications used to treat the reaction have worn off. Antihistamines tend to make people sleepy.   Apply cold cloths (compresses) to the skin or take baths in cool water. This will help itching. Avoid hot baths or showers. Heat will make a rash and itching worse.   If your allergies persist and become more severe, and over the counter medications are not effective, there are many new medications your caretaker can prescribe. Immunotherapy or desensitizing injections can be used if all else fails. Follow up with your caregiver if problems continue.  SEEK MEDICAL CARE IF:   Your allergies are becoming progressively more troublesome.   You suspect a food allergy. Symptoms generally happen within 30 minutes of eating a food.   Your symptoms have not gone away within 2 days or are getting worse.   You develop new symptoms.   You want to retest yourself or your child with a food or drink you think causes an allergic reaction. Never test yourself or your child of a suspected allergy without being under the watchful eye of your caregivers. A second   develop difficulty breathing or wheezing, or have a tight feeling in your chest or throat.   You develop a swollen mouth, hives, swelling, or itching all over your body.  A severe reaction with any of the above problems should be considered life-threatening. If you suddenly develop difficulty breathing call for local emergency medical help. THIS IS AN  EMERGENCY. MAKE SURE YOU:   Understand these instructions.   Will watch your condition.   Will get help right away if you are not doing well or get worse.  Document Released: 09/17/2007 Document Revised: 08/02/2011 Document Reviewed: 09/17/2007 [facility] Patient Information 2012 [place].Rash, Generic Many things can cause a rash. We are not certain what is causing the rash that you have. Some causes include infection, allergic reactions, medications, and chemicals. Sometimes something in your home that comes in contact with your skin may cause the rash. These include pets, new soaps, cosmetics, and foods. HOME CARE INSTRUCTIONS   Avoid extreme heat or cold, unless otherwise instructed. This can make the itching worse.   A cool bath or shower or a cool washcloth can sometimes ease the itching.   Avoid scratching. This can cause infection.   Take those medications prescribed by your caregiver.  SEEK IMMEDIATE MEDICAL CARE IF:  You develop increasing pain, swelling, or redness.   You develop a fever.   You develop new or severe symptoms such as body aches and pains, diarrhea, vomiting.   Your rash is not better in 3 days.  Document Released: 11/10/2002 Document Revised: 08/02/2011 Document Reviewed: 01/15/2009 [facility] Patient Information 2012 [place].

## 2012-02-12 ENCOUNTER — Encounter (INDEPENDENT_AMBULATORY_CARE_PROVIDER_SITE_OTHER): Payer: Self-pay | Admitting: Surgery

## 2012-03-05 ENCOUNTER — Other Ambulatory Visit (INDEPENDENT_AMBULATORY_CARE_PROVIDER_SITE_OTHER): Payer: Self-pay | Admitting: Surgery

## 2012-03-05 DIAGNOSIS — Z1231 Encounter for screening mammogram for malignant neoplasm of breast: Secondary | ICD-10-CM

## 2012-03-27 ENCOUNTER — Other Ambulatory Visit: Payer: Self-pay | Admitting: *Deleted

## 2012-03-27 MED ORDER — METOPROLOL SUCCINATE ER 25 MG PO TB24: 25.0000 mg | ORAL_TABLET | Freq: Every day | ORAL | Status: DC

## 2012-03-29 ENCOUNTER — Ambulatory Visit (HOSPITAL_COMMUNITY)
Admission: RE | Admit: 2012-03-29 | Discharge: 2012-03-29 | Disposition: A | Discharge status: Home / Self Care | Payer: Medicare Other | Source: Ambulatory Visit | Attending: Surgery | Admitting: Surgery

## 2012-03-29 DIAGNOSIS — Z1231 Encounter for screening mammogram for malignant neoplasm of breast: Secondary | ICD-10-CM | POA: Insufficient documentation

## 2012-04-04 ENCOUNTER — Other Ambulatory Visit: Payer: Self-pay | Admitting: Cardiology

## 2012-04-11 ENCOUNTER — Ambulatory Visit (INDEPENDENT_AMBULATORY_CARE_PROVIDER_SITE_OTHER): Payer: Medicare Other | Admitting: Surgery

## 2012-04-11 ENCOUNTER — Encounter (INDEPENDENT_AMBULATORY_CARE_PROVIDER_SITE_OTHER): Payer: Self-pay | Admitting: Surgery

## 2012-04-11 VITALS — BP 140/78 | HR 66 | Temp 97.1°F | Resp 18 | Ht 62.0 in | Wt 141.5 lb

## 2012-04-11 DIAGNOSIS — N6019 Diffuse cystic mastopathy of unspecified breast: Secondary | ICD-10-CM

## 2012-04-11 DIAGNOSIS — E042 Nontoxic multinodular goiter: Secondary | ICD-10-CM

## 2012-04-11 NOTE — Progress Notes (Signed)
[place], MD,  [address]  [place], [place]    [place] Phone:  [phone] FAX:  [phone]   Re:   CYNDEL URNESS DOB:   Oct 14, 1926 MRN:   [id number]  ASSESSMENT AND PLAN: 1.  Breast mastopathy  No new mass or problem.  Negative mammogram.  She'll see me in 1 year.  2.  Multinodular thyroid gland.  Normal thyroid function.  [place] unchanged on  10/11/2011.  Stable.  No further [place] unless there is a change in her exam.   3.  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  Taken care of by Dr. Rigoberto Noel at [facility] 4.  Hypertension. 5.  Gout in right hand,  But she has had trouble with some systemic reaction, including some pulmonary problems 6.  She asked about the next colonoscopy.  Dr. Sharlett Iles did one in 2003.  I did a right colectomy for benign peritoneal cysts.  I would look at the next colonoscopy around 10 years from my surgery - 2116, if her health is good.  This could be discussed at that time.  HISTORY OF PRESENT ILLNESS: Chief Complaint  Patient presents with  . Breast Cancer Long Term Follow Up    Elizabeth Burch is a 78 y.o. (DOB: [birth year])  white female who is a patient of MOORE, Elenore Rota, MD, MD and comes to me today for check thyroid and breast check.  I follow the patient for diffuse mastopathy. She had 3 sisters who had breast cancer. She originally was followed by Dr. Gretta Cool.  In September 2011, she fell and broke T6. This was managed at [facility]. Because of questionable syncope, she had an ultrasound that showed a multinodular thyroid gland. Her thyroid tests on 09 November 2010 showed normal TSH and normal thyroid functions.  Her last thyroid US 10/11/2011 was stable for a multi-nodular thyroid.  We will follow her with physical exams.  She has no new breast or thyroid complaints.  Her biggest problem has been with gout and a systemic reaction to some of the meds she has taken.  These have  caused pulmonary problems.  She is doing better today.  PHYSICAL EXAM: BP 140/78  Pulse 66  Temp(Src) 97.1 F (36.2 C) (Temporal)  Resp 18  Ht 5\' 2"  (1.575 m)  Wt 141 lb 8 oz (64.184 kg)  BMI 25.88 kg/m2  HEENT:  Pupils equal.  Dentition good.  No injury. NECK: No specific mass in thyroid, but lumpy. LYMPH NODES:  No cervical, supraclavicular, or axillary adenopathy. BREASTS -  RIGHT:  No palpable mass or nodule.  No nipple discharge.   LEFT:  No palpable mass or nodule.  No nipple discharge. UPPER EXTREMITIES:  No evidence of lymphedema. Lungs:  Clear Heart:  RRR.  No murmur.  DATA REVIEWED: Mammogram 03/29/2012 - negative  Alphonsa Overall, MD, FACS Office:  [phone]

## 2012-05-01 ENCOUNTER — Encounter: Payer: Self-pay | Admitting: Cardiology

## 2012-05-23 ENCOUNTER — Encounter: Payer: Self-pay | Admitting: Cardiology

## 2012-06-26 ENCOUNTER — Encounter: Payer: Self-pay | Admitting: Cardiology

## 2012-08-21 ENCOUNTER — Other Ambulatory Visit: Payer: Self-pay | Admitting: Dermatology

## 2012-09-01 ENCOUNTER — Emergency Department (HOSPITAL_COMMUNITY)
Admission: EM | Admit: 2012-09-01 | Discharge: 2012-09-01 | Disposition: A | Discharge status: Home / Self Care | Payer: Medicare Other | Attending: Emergency Medicine | Admitting: Emergency Medicine

## 2012-09-01 ENCOUNTER — Encounter (HOSPITAL_COMMUNITY): Payer: Self-pay | Admitting: Emergency Medicine

## 2012-09-01 DIAGNOSIS — Z803 Family history of malignant neoplasm of breast: Secondary | ICD-10-CM | POA: Insufficient documentation

## 2012-09-01 DIAGNOSIS — H409 Unspecified glaucoma: Secondary | ICD-10-CM | POA: Insufficient documentation

## 2012-09-01 DIAGNOSIS — M109 Gout, unspecified: Secondary | ICD-10-CM | POA: Insufficient documentation

## 2012-09-01 DIAGNOSIS — I10 Essential (primary) hypertension: Secondary | ICD-10-CM | POA: Insufficient documentation

## 2012-09-01 DIAGNOSIS — Z7982 Long term (current) use of aspirin: Secondary | ICD-10-CM | POA: Insufficient documentation

## 2012-09-01 DIAGNOSIS — R002 Palpitations: Secondary | ICD-10-CM | POA: Insufficient documentation

## 2012-09-01 NOTE — ED Notes (Signed)
Pt. Stated, My BP has been going up and down and not feeling good.

## 2012-09-01 NOTE — ED Provider Notes (Signed)
History     CSN: [id number]  Arrival date & time 09/01/12  1421   First MD Initiated Contact with Patient 09/01/12 1643      Chief Complaint  Patient presents with  . Hypertension    (Consider location/radiation/quality/duration/timing/severity/associated sxs/prior treatment) HPI  78 y.o. female INAD c/o elevated blood pressure she was checking at home this a.m. history to was 190/74, pressure normally runs in the 140s over 50s. Denies chest pain, palpitations, nausea vomiting, change in bowel or bladder habits, dysarthria, change in vision, generalized or focal weakness. Patient does state that her upper lip feels "weird." Patient has been taking prednisone Dosepak for gout exacerbation she is on day 3 of 5.  Past Medical History  Diagnosis Date  . HTN (hypertension)   . Fibrocystic breast disease   . Palpitations   . Glaucoma   . Multiple thyroid nodules   . Stasis dermatitis   . Gout     Past Surgical History  Procedure Date  . Tonsillectomy   . Cesarean section   . Cystectomy     on colon    Family History  Problem Relation Age of Onset  . Cancer Mother     pt Francene Boyers of the origin  . Cancer Sister     breast    History  Substance Use Topics  . Smoking status: Never Smoker   . Smokeless tobacco: Not on file  . Alcohol Use: No    OB History    Grav Para Term Preterm Abortions TAB SAB Ect Mult Living                  Review of Systems  Constitutional: Negative for fever.  Respiratory: Negative for shortness of breath.   Cardiovascular: Negative for chest pain.  Gastrointestinal: Negative for nausea, vomiting, abdominal pain and diarrhea.  Genitourinary: Negative for flank pain.  Neurological: Negative for dizziness, syncope, facial asymmetry, weakness and numbness.  All other systems reviewed and are negative.    Allergies  Ace inhibitors; Allopurinol; Uloric; Alendronate sodium; Ciprofloxacin; Clarithromycin; Clindamycin/lincomycin;  Nitrofurantoin; Ofloxacin; Penicillins; Sulfamethoxazole; and Sulfonamide derivatives  Home Medications   Current Outpatient Rx  Name Route Sig Dispense Refill  . AMLODIPINE BESYLATE 5 MG PO TABS Oral Take 5 mg by mouth every morning.    . ASPIRIN EC 81 MG PO TBEC Oral Take 81 mg by mouth daily.    . COLCHICINE 0.6 MG PO TABS Oral Take 0.6 mg by mouth daily.    . DORZOLAMIDE HCL 2 % OP SOLN Both Eyes Place 1 drop into both eyes 2 (two) times daily.    . FUROSEMIDE 40 MG PO TABS Oral Take 40 mg by mouth daily.     Marland Kitchen LATANOPROST 0.005 % OP SOLN Both Eyes Place 1 drop into both eyes at bedtime.     Marland Kitchen METOPROLOL SUCCINATE ER 25 MG PO TB24 Oral Take 25 mg by mouth at bedtime.    Marland Kitchen FISH OIL 1000 MG PO CAPS Oral Take 1,000 mg by mouth daily.    Marland Kitchen PANTOPRAZOLE SODIUM 40 MG PO TBEC Oral Take 40 mg by mouth at bedtime.     Marland Kitchen VALSARTAN 320 MG PO TABS Oral Take 160 mg by mouth every morning.       BP 167/52  Pulse 56  Temp 98 F (36.7 C) (Oral)  Resp 15  SpO2 98%  Physical Exam  Nursing note and vitals reviewed. Constitutional: She is oriented to person, place, and time. She appears  well-developed and well-nourished. No distress.  HENT:  Head: Normocephalic.  Eyes: Conjunctivae normal and EOM are normal.  Cardiovascular: Normal rate.   Pulmonary/Chest: Effort normal and breath sounds normal. No stridor.  Abdominal: Soft. Bowel sounds are normal. There is no tenderness.  Musculoskeletal: Normal range of motion.  Neurological: She is alert and oriented to person, place, and time.       Cranial nerves III through XII intact, strength 5 out of 5x4 extremities, negative pronator drift, finger to nose and heel-to-shin coordinated, sensation intact to pinprick and light touch (thoughout and in all distributions of trigeminal nerve bilaterally)  , gait is coordinated and Romberg is negative.     Psychiatric: She has a normal mood and affect.    ED Course  Procedures (including critical care  time)  Labs Reviewed - No data to display No results found.   1. Hypertension       MDM  Asymptomatic elevated blood pressure. Will advise DC of prednisone, reduced salt intake and follow up with PCP. Return precautions given.         Monico Blitz, PA-C 09/01/12 1747

## 2012-09-01 NOTE — ED Provider Notes (Signed)
Medical screening examination/treatment/procedure(s) were conducted as a shared visit with non-physician practitioner(s) and myself.  I personally evaluated the patient during the encounter  Pt with asymptomatic HTN after 10 days of prednisone taper for gout. She is not having any more pain. Advised to stop prednisone. Follow up with PCP this week.  Mekenna Finau B. Karle Starch, MD 09/01/12 [phone]

## 2012-09-16 ENCOUNTER — Encounter: Payer: Self-pay | Admitting: Infectious Disease

## 2012-09-16 ENCOUNTER — Ambulatory Visit (INDEPENDENT_AMBULATORY_CARE_PROVIDER_SITE_OTHER): Payer: Medicare Other | Admitting: Infectious Disease

## 2012-09-16 VITALS — BP 171/64 | HR 65 | Temp 97.5°F | Wt 141.0 lb

## 2012-09-16 DIAGNOSIS — R600 Localized edema: Secondary | ICD-10-CM | POA: Insufficient documentation

## 2012-09-16 DIAGNOSIS — I8310 Varicose veins of unspecified lower extremity with inflammation: Secondary | ICD-10-CM

## 2012-09-16 DIAGNOSIS — R609 Edema, unspecified: Secondary | ICD-10-CM

## 2012-09-16 DIAGNOSIS — I872 Venous insufficiency (chronic) (peripheral): Secondary | ICD-10-CM

## 2012-09-16 DIAGNOSIS — L03119 Cellulitis of unspecified part of limb: Secondary | ICD-10-CM

## 2012-09-16 DIAGNOSIS — L02419 Cutaneous abscess of limb, unspecified: Secondary | ICD-10-CM

## 2012-09-16 NOTE — Patient Instructions (Addendum)
Please make appt to see Dr. Amy Martinique and tell her about the steroids made your rash better  Make appt with [place] after seeing Dr. Martinique  We will also organize a study of your veins in left leg

## 2012-09-16 NOTE — Progress Notes (Signed)
Subjective:    Patient ID: Elizabeth Burch, female    DOB: 05-06-1926, 78 y.o.   MRN: [id number]  HPI 78 year old lady who developed erythema on her posterior left calf more than a year ago. The area was approximatley one to two cm in dimensions. It had since grown to at least 5cm in greatest dimensions and had been  isolated to her proximal calf. She has lacked any fever, nausea, malaise or nausea or vomiting or systemic symptoms. Her PCP tried keflex (despite PCN allergy) and developed profuse diarrhea and then stopped this. She was then started on doxyycline this past Friday without much improvement yet. She was worked in as an urgent consult on behalf of her PCP. She has noteworthy allergies to PCN though she cannot recall the type of reaction. Also allergy to cipro, bactrim but again unknown. She had severe anaphylactic reaction to macrobid. I had prescribed her clindamycin which she took without improvement in erythema in her leg. She was seen in the ED with worsening renal funciton. I discussed her case with the ED MD and pts erythema had not worsened or improved. I recommended stopping the clindamycin. The patients erythema did not worsen off antibiotics.  She was ultimately seen her dermatologist who believed her erythema in her leg is due to stasis dermatitis and she recommended further diuretic use and topical agent. We had contemplated biopsy, and I believe she should have biopsy since then. Patient claims now that the erythema in her leg has worsened over the last 3 months. Additionally erythema did improve when she was treated with prednisone for gout attack. She is currently not on any antibiotic therapy. She is without fevers nausea malaise or other systemic symptoms. Her leg does continue to have some erythema minimal warmth but  I DO NOT find it to be consistent with cellulitis.  [facility]  Review of Systems  Constitutional: Negative for fever, chills, diaphoresis, activity  change, appetite change, fatigue and unexpected weight change.  HENT: Negative for congestion, sore throat, rhinorrhea, sneezing, trouble swallowing and sinus pressure.   Eyes: Negative for photophobia and visual disturbance.  Respiratory: Negative for cough, chest tightness, shortness of breath, wheezing and stridor.   Cardiovascular: Negative for chest pain, palpitations and leg swelling.  Gastrointestinal: Negative for nausea, vomiting, abdominal pain, diarrhea, constipation, blood in stool, abdominal distention and anal bleeding.  Genitourinary: Negative for dysuria, hematuria, flank pain and difficulty urinating.  Musculoskeletal: Negative for myalgias, back pain, joint swelling, arthralgias and gait problem.  Skin: Positive for rash. Negative for color change, pallor and wound.  Neurological: Negative for dizziness, tremors, weakness and light-headedness.  Hematological: Negative for adenopathy. Does not bruise/bleed easily.  Psychiatric/Behavioral: Negative for behavioral problems, confusion, disturbed wake/sleep cycle, dysphoric mood, decreased concentration and agitation.       Objective:   Physical Exam  Constitutional: She is oriented to person, place, and time. She appears well-developed and well-nourished. She appears distressed.  HENT:  Head: Normocephalic and atraumatic.  Mouth/Throat: Oropharynx is clear and moist. No oropharyngeal exudate.  Eyes: Conjunctivae normal and EOM are normal. Pupils are equal, round, and reactive to light. No scleral icterus.  Neck: Normal range of motion. Neck supple. JVD present.  Cardiovascular: Normal rate, regular rhythm and normal heart sounds.  Exam reveals no gallop and no friction rub.   No murmur heard. Pulmonary/Chest: Effort normal and breath sounds normal. No respiratory distress. She has no wheezes. She has no rales. She exhibits no tenderness.  Abdominal: She exhibits no distension and no mass. There is no tenderness. There is no  rebound and no guarding.  Musculoskeletal: She exhibits no edema and no tenderness.       Legs: Lymphadenopathy:    She has no cervical adenopathy.  Neurological: She is alert and oriented to person, place, and time. She has normal reflexes. She exhibits normal muscle tone. Coordination normal.  Skin: Skin is warm and dry. She is not diaphoretic. There is erythema. No pallor.  Psychiatric: She has a normal mood and affect. Her behavior is normal. Judgment and thought content normal.          Assessment & Plan:  Cellulitis, leg I do not think that she has cellulitis. The chronicity of these symptoms which are getting back to more than a year ago are all consistent with a venous stasis process plus or minus an autoimmune process given the fact that his erythema actually improved with corticosteroid therapy. I advised her to see her dermatologist once again and did not be placed on antibiotics. I'll see her back in a few weeks time to reassess her.  Stasis dermatitis See above

## 2012-09-16 NOTE — Assessment & Plan Note (Signed)
I do not think that she has cellulitis. The chronicity of these symptoms which are getting back to more than a year ago are all consistent with a venous stasis process plus or minus an autoimmune process given the fact that his erythema actually improved with corticosteroid therapy. I advised her to see her dermatologist once again and did not be placed on antibiotics. I'll see her back in a few weeks time to reassess her.

## 2012-09-16 NOTE — Assessment & Plan Note (Signed)
See above

## 2012-09-17 ENCOUNTER — Telehealth: Payer: Self-pay | Admitting: *Deleted

## 2012-09-17 ENCOUNTER — Ambulatory Visit (HOSPITAL_COMMUNITY)
Admission: RE | Admit: 2012-09-17 | Discharge: 2012-09-17 | Disposition: A | Discharge status: Home / Self Care | Payer: Medicare Other | Source: Ambulatory Visit | Attending: Infectious Disease | Admitting: Infectious Disease

## 2012-09-17 DIAGNOSIS — M7989 Other specified soft tissue disorders: Secondary | ICD-10-CM | POA: Insufficient documentation

## 2012-09-17 DIAGNOSIS — R600 Localized edema: Secondary | ICD-10-CM

## 2012-09-17 DIAGNOSIS — I824Z9 Acute embolism and thrombosis of unspecified deep veins of unspecified distal lower extremity: Secondary | ICD-10-CM | POA: Insufficient documentation

## 2012-09-17 DIAGNOSIS — I803 Phlebitis and thrombophlebitis of lower extremities, unspecified: Secondary | ICD-10-CM | POA: Insufficient documentation

## 2012-09-17 NOTE — Telephone Encounter (Signed)
Call from Radiology, patient has a DVT left lower extremity. Spoke with nurse, Altha Harm at PCP Dr. Evelene Croon office [phone] and gave the results.  She will call patient today and arrange Lovenox and coumadin. Myrtis Hopping CMA

## 2012-09-17 NOTE — Telephone Encounter (Signed)
Excellent

## 2012-09-18 ENCOUNTER — Telehealth: Payer: Self-pay | Admitting: *Deleted

## 2012-09-18 NOTE — Telephone Encounter (Signed)
Patient called asking if she still needs to see the dermatologist in light of her issue having been a DVT? Myrtis Hopping CMA

## 2012-09-18 NOTE — Telephone Encounter (Signed)
NO SHE DOES NOT!! Thanks Kennyth Lose!

## 2012-10-26 ENCOUNTER — Other Ambulatory Visit: Payer: Self-pay | Admitting: Cardiology

## 2012-10-30 ENCOUNTER — Ambulatory Visit (INDEPENDENT_AMBULATORY_CARE_PROVIDER_SITE_OTHER): Payer: Medicare Other | Admitting: Infectious Disease

## 2012-10-30 ENCOUNTER — Encounter: Payer: Self-pay | Admitting: Infectious Disease

## 2012-10-30 VITALS — BP 174/67 | HR 65 | Temp 98.2°F | Wt 141.0 lb

## 2012-10-30 DIAGNOSIS — I82409 Acute embolism and thrombosis of unspecified deep veins of unspecified lower extremity: Secondary | ICD-10-CM

## 2012-10-30 DIAGNOSIS — O2230 Deep phlebothrombosis in pregnancy, unspecified trimester: Secondary | ICD-10-CM

## 2012-10-30 DIAGNOSIS — L0291 Cutaneous abscess, unspecified: Secondary | ICD-10-CM

## 2012-10-30 DIAGNOSIS — L0390 Cellulitis, unspecified: Secondary | ICD-10-CM

## 2012-10-30 NOTE — Progress Notes (Signed)
  Subjective:    Patient ID: Elizabeth Burch, female    DOB: 09-28-1926, 78 y.o.   MRN: [id number]  HPI  78 year old lady who developed erythema on her posterior left calf more than a year ago. He had worked this up extensively but found little to suggest an actual infectious disease. At her last clinic visit we actually found out via Doppler that she actually was suffering from a deep venous thrombosis. She's currently on Coumadin and has had improvement in the swelling and pain in her left leg.  Review of Systems  Constitutional: Negative for fever, chills, diaphoresis, activity change, appetite change, fatigue and unexpected weight change.  HENT: Negative for congestion, sore throat, rhinorrhea, sneezing, trouble swallowing and sinus pressure.   Eyes: Negative for photophobia and visual disturbance.  Respiratory: Negative for cough, chest tightness, shortness of breath, wheezing and stridor.   Cardiovascular: Negative for chest pain, palpitations and leg swelling.  Gastrointestinal: Negative for nausea, vomiting, abdominal pain, diarrhea, constipation, blood in stool, abdominal distention and anal bleeding.  Genitourinary: Negative for dysuria, hematuria, flank pain and difficulty urinating.  Musculoskeletal: Negative for myalgias, back pain, joint swelling, arthralgias and gait problem.  Skin: Positive for color change. Negative for pallor, rash and wound.  Neurological: Negative for dizziness, tremors, weakness and light-headedness.  Hematological: Negative for adenopathy. Does not bruise/bleed easily.  Psychiatric/Behavioral: Negative for behavioral problems, confusion, sleep disturbance, dysphoric mood, decreased concentration and agitation.       Objective:   Physical Exam  Constitutional: She is oriented to person, place, and time. She appears well-developed and well-nourished. No distress.  HENT:  Head: Normocephalic and atraumatic.  Mouth/Throat: Oropharynx is clear and moist.  No oropharyngeal exudate.  Eyes: Conjunctivae normal and EOM are normal. Pupils are equal, round, and reactive to light. No scleral icterus.  Neck: Normal range of motion. Neck supple. No JVD present.  Cardiovascular: Normal rate, regular rhythm and normal heart sounds.  Exam reveals no gallop and no friction rub.   No murmur heard. Pulmonary/Chest: Effort normal and breath sounds normal. No respiratory distress. She has no wheezes. She has no rales. She exhibits no tenderness.  Abdominal: She exhibits no distension and no mass. There is no tenderness. There is no rebound and no guarding.  Musculoskeletal: She exhibits no edema and no tenderness.  Lymphadenopathy:    She has no cervical adenopathy.  Neurological: She is alert and oriented to person, place, and time. She has normal reflexes. She exhibits normal muscle tone. Coordination normal.  Skin: Skin is warm and dry. She is not diaphoretic. No erythema. No pallor.     Psychiatric: She has a normal mood and affect. Her behavior is normal. Judgment and thought content normal.          Assessment & Plan:  Deep venous thrombosis: Managed properly care physician Dr. Laurance Flatten.  Prior suspected cellulitis: This was incorrect diagnosis turns out her erythema was due to venous stasis changes in conjunction with her deep venous thrombosis.

## 2012-12-10 ENCOUNTER — Encounter: Payer: Self-pay | Admitting: Cardiology

## 2013-01-16 ENCOUNTER — Ambulatory Visit: Payer: Medicare Other | Admitting: Cardiology

## 2013-02-14 ENCOUNTER — Encounter: Payer: Self-pay | Admitting: Cardiology

## 2013-02-14 ENCOUNTER — Ambulatory Visit (INDEPENDENT_AMBULATORY_CARE_PROVIDER_SITE_OTHER): Payer: Medicare Other | Admitting: Cardiology

## 2013-02-14 VITALS — BP 152/68 | HR 58 | Ht 62.0 in | Wt 141.0 lb

## 2013-02-14 DIAGNOSIS — I82402 Acute embolism and thrombosis of unspecified deep veins of left lower extremity: Secondary | ICD-10-CM

## 2013-02-14 DIAGNOSIS — I10 Essential (primary) hypertension: Secondary | ICD-10-CM

## 2013-02-14 DIAGNOSIS — I82409 Acute embolism and thrombosis of unspecified deep veins of unspecified lower extremity: Secondary | ICD-10-CM

## 2013-02-14 DIAGNOSIS — N189 Chronic kidney disease, unspecified: Secondary | ICD-10-CM

## 2013-02-14 NOTE — Patient Instructions (Addendum)
Stop aspirin.  Your physician wants you to follow-up in: 1 year with Dr Aundra Dubin. (March 2015). You will receive a reminder letter in the mail two months in advance. If you don't receive a letter, please call our office to schedule the follow-up appointment.

## 2013-02-16 DIAGNOSIS — I82409 Acute embolism and thrombosis of unspecified deep veins of unspecified lower extremity: Secondary | ICD-10-CM | POA: Insufficient documentation

## 2013-02-16 NOTE — Progress Notes (Signed)
Patient ID: Elizabeth Burch, female   DOB: 10-18-1926, 79 y.o.   MRN: [id number] PCP: Dr. Laurance Flatten  79 yo with history of HTN, CKD, and palpitations returns for cardiology followup. Since last appointment, she developed a spontaneous DVT on the left in 10/13.  She is now on coumadin.  About 60 years ago she also had a DVT after one of her children.  She also thinks she had a DVT several years ago.  No exertional chest pain or dyspnea. She continues to walk 1-2 miles a day on her treadmill with no problems.  She is steady on her feet.  SBP is < 140 most of the time when she checks it at home.  Minimal palpitations on Toprol XL.   Labs (7/10): K 5.7, creatinine 1.44  Labs (11/10): K 4.6, creatinine 1.38  Labs (9/11): HDL 96, LDL 69, creatinine  Labs (12/11): TSH normal  Labs (11/12): K 4.5, creatinine 1.98 Labs (1/14): K 4.3, creatinine 1.78  ECG: NSR, nonspecific T wave changes  Allergies (verified):  1) ! Macrodantin  2) ! Cipro  3) ! Pcn  4) ! Biaxin  5) ! Fosamax  6) ! Ace Inhibitors  7) ! Floxin  8) ! Sulfa  9) Sulfamethoxazole (Sulfamethoxazole)  10) Penicillin V Potassium (Penicillin V Potassium)   Past Medical History:  1. Hypertension.  2. Hyperlipidemia.  3. Gastroesophageal reflux disease.  4. Fibrocystic breast disease,.  5. History of colon polyps.  6. Palpitations. The patient did have a Holter monitor done in January 2010, showing quite frequent PACs, but no SVT or no other serious arrhythmia. 3 week event monitor in 9/11 showed mild sinus bradycardia to the 50s at times, otherwise unremarkable.  7. Echocardiogram done in January 2010 showed an EF of 70%, normal LV size, no regional [place] motion abnormalities. The valves look normal. There is mild pulmonary hypertension with pulmonary artery systolic pressure of 40 mmHg. Echo (10/11): Mild focal basal septal hypertrophy, EF 123456, grade I diastolic dysfunction, PA systolic pressure 31 mmHg, borderline atrial septal  aneurysm.  8. CKD with mild hyperkalemia  9. Carotid dopplers (10/11): normal.  10. Multinodular goiter 11. Gout 12. DVT: 1st episode after childbirth, then another she thinks early 2000s, third episode 10/13.   Family History:  The patient's brother had an MI in his 25s.   Social History:  Married, lives in [place].  Tobacco Use - No.  Alcohol Use - no  Current Outpatient Prescriptions  Medication Sig Dispense Refill  . amLODipine (NORVASC) 5 MG tablet Take 5 mg by mouth every morning.      . clobetasol cream (TEMOVATE) 0.05 % as directed.       . colchicine 0.6 MG tablet Take 0.6 mg by mouth daily.      . dorzolamide (TRUSOPT) 2 % ophthalmic solution Place 1 drop into both eyes 2 (two) times daily.      . furosemide (LASIX) 40 MG tablet Take 40 mg by mouth daily.       Marland Kitchen latanoprost (XALATAN) 0.005 % ophthalmic solution Place 1 drop into both eyes at bedtime.       Marland Kitchen losartan (COZAAR) 100 MG tablet Take 100 mg by mouth daily.       . metoprolol succinate (TOPROL-XL) 25 MG 24 hr tablet TAKE 1 TABLET ONCE A DAY  30 tablet  12  . Omega-3 Fatty Acids (FISH OIL) 1000 MG CAPS Take 1,000 mg by mouth daily.      . pantoprazole (  PROTONIX) 40 MG tablet Take 40 mg by mouth at bedtime.       . predniSONE (DELTASONE) 5 MG tablet Take 5 mg by mouth.       . warfarin (COUMADIN) 5 MG tablet Take 5 mg by mouth daily.       No current facility-administered medications for this visit.    BP 152/68  Pulse 58  Ht 5\' 2"  (1.575 m)  Wt 141 lb (63.957 kg)  BMI 25.78 kg/m2 General: NAD Neck: No JVD, no thyromegaly or thyroid nodule.  Lungs: Clear to auscultation bilaterally with normal respiratory effort. CV: Nondisplaced PMI.  Heart regular S1/S2, no S3/S4, 1/6 early SEM RUSB.  No peripheral edema.  No carotid bruit.  Normal pedal pulses.  Abdomen: Soft, nontender, no hepatosplenomegaly, no distention.  Neurologic: Alert and oriented x 3.  Psych: Normal affect. Extremities: No clubbing or  cyanosis.   Assessment/Plan: 1. HTN: Seems to be well-controlled.  < [id number] systolic when she checks at home.  2. DVT: Left DVT found in 10/13.  She is on coumadin.  This was a spontaneous DVT.  Sounds like she has had DVTs (potentially 2) in the past.  Would consider maintaining her on long-term anticoagulation given spontaneous, recurrent DVT. She is steady on her feet.  She can stop ASA while taking coumadin.  3. CKD: Stable, follows with nephrology.   [name] 02/16/2013

## 2013-02-18 ENCOUNTER — Ambulatory Visit (INDEPENDENT_AMBULATORY_CARE_PROVIDER_SITE_OTHER): Payer: Medicare Other | Admitting: Pharmacist

## 2013-02-18 DIAGNOSIS — I82409 Acute embolism and thrombosis of unspecified deep veins of unspecified lower extremity: Secondary | ICD-10-CM

## 2013-02-18 LAB — POCT INR: INR: 2.9

## 2013-02-19 ENCOUNTER — Telehealth: Payer: Self-pay | Admitting: Oncology

## 2013-02-19 NOTE — Telephone Encounter (Signed)
S/W PT IN REF TO NP APPT. ON 03/21/13@10 :62 REFERRING DR Laurance Flatten DX-HX OF DVT EVALUATE FOR ANTICOAGULATION DISORDER MAILED NP PACKET

## 2013-02-19 NOTE — Telephone Encounter (Signed)
C/D 02/19/13 for appt. 03/21/13

## 2013-02-25 ENCOUNTER — Other Ambulatory Visit (INDEPENDENT_AMBULATORY_CARE_PROVIDER_SITE_OTHER): Payer: Self-pay | Admitting: Surgery

## 2013-02-25 DIAGNOSIS — Z1231 Encounter for screening mammogram for malignant neoplasm of breast: Secondary | ICD-10-CM

## 2013-02-28 ENCOUNTER — Other Ambulatory Visit: Payer: Self-pay | Admitting: Family Medicine

## 2013-03-10 ENCOUNTER — Ambulatory Visit (INDEPENDENT_AMBULATORY_CARE_PROVIDER_SITE_OTHER): Payer: Medicare Other | Admitting: Pharmacist

## 2013-03-10 ENCOUNTER — Ambulatory Visit (INDEPENDENT_AMBULATORY_CARE_PROVIDER_SITE_OTHER): Payer: Medicare Other | Admitting: Family Medicine

## 2013-03-10 ENCOUNTER — Encounter (INDEPENDENT_AMBULATORY_CARE_PROVIDER_SITE_OTHER): Payer: Medicare Other | Admitting: *Deleted

## 2013-03-10 VITALS — BP 168/58 | HR 70 | Temp 97.0°F | Ht 62.0 in | Wt 143.0 lb

## 2013-03-10 DIAGNOSIS — I8310 Varicose veins of unspecified lower extremity with inflammation: Secondary | ICD-10-CM

## 2013-03-10 DIAGNOSIS — I809 Phlebitis and thrombophlebitis of unspecified site: Secondary | ICD-10-CM

## 2013-03-10 DIAGNOSIS — M109 Gout, unspecified: Secondary | ICD-10-CM

## 2013-03-10 DIAGNOSIS — I10 Essential (primary) hypertension: Secondary | ICD-10-CM

## 2013-03-10 DIAGNOSIS — Z79899 Other long term (current) drug therapy: Secondary | ICD-10-CM

## 2013-03-10 DIAGNOSIS — N184 Chronic kidney disease, stage 4 (severe): Secondary | ICD-10-CM

## 2013-03-10 DIAGNOSIS — R609 Edema, unspecified: Secondary | ICD-10-CM

## 2013-03-10 DIAGNOSIS — I8312 Varicose veins of left lower extremity with inflammation: Secondary | ICD-10-CM

## 2013-03-10 DIAGNOSIS — I82409 Acute embolism and thrombosis of unspecified deep veins of unspecified lower extremity: Secondary | ICD-10-CM

## 2013-03-10 LAB — BASIC METABOLIC PANEL WITH GFR
BUN: 33 mg/dL — ABNORMAL HIGH (ref 6–23)
Chloride: 106 mEq/L (ref 96–112)
Creat: 1.81 mg/dL — ABNORMAL HIGH (ref 0.50–1.10)
GFR, Est Non African American: 25 mL/min — ABNORMAL LOW
Glucose, Bld: 81 mg/dL (ref 70–99)

## 2013-03-10 LAB — POCT CBC
HCT, POC: 37 % — AB (ref 37.7–47.9)
Lymph, poc: 2.1 (ref 0.6–3.4)
MCH, POC: 29.3 pg (ref 27–31.2)
MCV: 89.5 fL (ref 80–97)
Platelet Count, POC: 331 10*3/uL (ref 142–424)
RBC: 4.1 M/uL (ref 4.04–5.48)
WBC: 8.3 10*3/uL (ref 4.6–10.2)

## 2013-03-10 MED ORDER — CEPHALEXIN 500 MG PO CAPS: 500.0000 mg | ORAL_CAPSULE | Freq: Three times a day (TID) | ORAL | Status: DC

## 2013-03-10 NOTE — Progress Notes (Signed)
  Subjective:    Patient ID: Elizabeth Burch, female    DOB: Jul 06, 1926, 79 y.o.   MRN: [id number]  HPI a woke with pain early Saturday morning in right medial thigh. No more pain in medial thigh. The right lower leg has had increasing erythema and warmth for about a week. The episode of pain in the thigh was similar to the time when she had a blood clot in the past.   Review of Systems  Constitutional: Negative for fever, activity change and fatigue.  HENT: Negative.   Eyes: Negative.   Respiratory: Positive for shortness of breath (slightly increased).   Cardiovascular: Negative for chest pain. Leg swelling: left.  Gastrointestinal: Negative.   Genitourinary: Negative.   Neurological: Negative.   Hematological: Negative.        Objective:   Physical Exam  Nursing note and vitals reviewed. Constitutional: She is oriented to person, place, and time. She appears well-developed and well-nourished.  HENT:  Head: Normocephalic and atraumatic.  Eyes: Conjunctivae are normal.  Neck: Normal range of motion.  Cardiovascular: Normal rate, regular rhythm and normal heart sounds.   No murmur heard. Pulmonary/Chest: Breath sounds normal.  Musculoskeletal: Normal range of motion.  Neurological: She is alert and oriented to person, place, and time. She has normal reflexes.  Skin: Skin is warm. There is erythema (left lower leg).  Psychiatric: She has a normal mood and affect. Her behavior is normal. Judgment and thought content normal.          Assessment & Plan:  1. Chronic kidney disease (CKD), stage IV (severe) - POCT CBC - BASIC METABOLIC PANEL WITH GFR  2. Venous stasis dermatitis, left - POCT CBC - cephALEXin (KEFLEX) 500 MG capsule; Take 1 capsule (500 mg total) by mouth 3 (three) times daily.  Dispense: 30 capsule; Refill: 1 - Ambulatory referral to Vascular Surgery  3. Essential hypertension, benign  4. Thrombophlebitis - Ambulatory referral to Vascular Surgery  5.  High risk medication use  6. Gout - Uric acid  7. DVT (deep venous thrombosis), unspecified laterality - POCT INR - Ambulatory referral to Vascular Surgery

## 2013-03-10 NOTE — Patient Instructions (Signed)
Fall prevention We will get appointment with vascular surgeon for evaluation and Dopplers

## 2013-03-11 ENCOUNTER — Telehealth: Payer: Self-pay | Admitting: Oncology

## 2013-03-11 NOTE — Telephone Encounter (Signed)
Called pt to r/s np appt. (per Dr. Alen Blew) to 04/08/13@1 :49

## 2013-03-12 ENCOUNTER — Other Ambulatory Visit: Payer: Medicare Other

## 2013-03-12 ENCOUNTER — Ambulatory Visit (INDEPENDENT_AMBULATORY_CARE_PROVIDER_SITE_OTHER): Payer: Medicare Other | Admitting: Pharmacist

## 2013-03-12 DIAGNOSIS — I82409 Acute embolism and thrombosis of unspecified deep veins of unspecified lower extremity: Secondary | ICD-10-CM

## 2013-03-20 ENCOUNTER — Encounter: Payer: Self-pay | Admitting: Surgery

## 2013-03-20 ENCOUNTER — Ambulatory Visit (INDEPENDENT_AMBULATORY_CARE_PROVIDER_SITE_OTHER): Payer: Medicare Other | Admitting: Pharmacist

## 2013-03-20 DIAGNOSIS — I82409 Acute embolism and thrombosis of unspecified deep veins of unspecified lower extremity: Secondary | ICD-10-CM

## 2013-03-20 LAB — POCT INR: INR: 2.4

## 2013-03-21 ENCOUNTER — Other Ambulatory Visit: Payer: Medicare Other | Admitting: Lab

## 2013-03-21 ENCOUNTER — Ambulatory Visit: Payer: Medicare Other | Admitting: Oncology

## 2013-03-21 ENCOUNTER — Ambulatory Visit: Payer: Medicare Other

## 2013-03-24 ENCOUNTER — Encounter (INDEPENDENT_AMBULATORY_CARE_PROVIDER_SITE_OTHER): Payer: Medicare Other | Admitting: *Deleted

## 2013-03-24 ENCOUNTER — Encounter: Payer: Self-pay | Admitting: Surgery

## 2013-03-24 ENCOUNTER — Ambulatory Visit (INDEPENDENT_AMBULATORY_CARE_PROVIDER_SITE_OTHER): Payer: Medicare Other | Admitting: Surgery

## 2013-03-24 VITALS — BP 165/63 | HR 65 | Resp 16 | Ht 62.0 in | Wt 141.0 lb

## 2013-03-24 DIAGNOSIS — I739 Peripheral vascular disease, unspecified: Secondary | ICD-10-CM

## 2013-03-24 DIAGNOSIS — R609 Edema, unspecified: Secondary | ICD-10-CM

## 2013-03-24 NOTE — Progress Notes (Signed)
[facility]   Patient name: Elizabeth Burch MRN: [id number] DOB: 1926/03/15 Sex: female   Referred by: Dr. Laurance Flatten  Reason for referral:  Chief Complaint  Patient presents with  . New Evaluation    C/O Bilateral redness of legs, duration 3-4 wks.   Sept. 2013 pt has same condition, now on Coumadin 5 mg    HISTORY OF PRESENT ILLNESS: This is a very pleasant 79 year old female who comes in today for evaluation of her left leg DVT and recent leg pain. The patient had a spontaneous DVT on the left leg in September of 2013. She has been on Coumadin ever sense than. According to the patient, there were no causative factors such as travel or prolonged bedrest. She has no history of PE or clotting disorder. She does report a DVT approximately 16 years ago Approximately one month ago she developed pain in her left thigh and swelling. An ultrasound was negative for an acute DVT. She continues to wear a combination of thigh high and knee-high compression stockings which she states are 15 mm compression. Her recent left leg issues have nearly resolved. Again, there are no aggravating factors.  The patient is medically managed for her hypertension which has been under good control. She also sees nephrology for chronic renal insufficiency.    Past Medical History  Diagnosis Date  . HTN (hypertension)   . Fibrocystic breast disease   . Palpitations   . Glaucoma(365)   . Multiple thyroid nodules   . Stasis dermatitis   . Gout   . DVT (deep venous thrombosis) 1950  . Arthritis     Gout  . Peripheral vascular disease     Past Surgical History  Procedure Laterality Date  . Tonsillectomy    . Cesarean section    . Cystectomy      on colon    History   Social History  . Marital Status: Married    Spouse Name: N/A    Number of Children: N/A  . Years of Education: N/A   Occupational History  . Not on file.   Social History Main Topics  . Smoking status:  Never Smoker   . Smokeless tobacco: Never Used  . Alcohol Use: No  . Drug Use: No  . Sexually Active: Not on file   Other Topics Concern  . Not on file   Social History Narrative  . No narrative on file    Family History  Problem Relation Age of Onset  . Cancer Mother     pt Francene Boyers of the origin  . Cancer Sister     breast    Allergies as of 03/24/2013 - Review Complete 03/24/2013  Allergen Reaction Noted  . Prednisone Hypertension 03/24/2013  . Ace inhibitors Other (See Comments)   . Allopurinol  04/11/2012  . Uloric (febuxostat)  04/11/2012  . Alendronate sodium Rash   . Ciprofloxacin Rash   . Clarithromycin Rash   . Clindamycin/lincomycin Rash 09/06/2011  . Nitrofurantoin Rash   . Ofloxacin Rash   . Penicillins Rash 09/18/2007  . Sulfamethoxazole Rash 09/18/2007  . Sulfonamide derivatives Rash     Current Outpatient Prescriptions on File Prior to Visit  Medication Sig Dispense Refill  . amLODipine (NORVASC) 5 MG tablet Take 5 mg by mouth every morning.      Marland Kitchen AMLODIPINE BESYLATE PO Take 5 mg by mouth daily.      . cephALEXin (KEFLEX) 500 MG capsule Take 1 capsule (500 mg  total) by mouth 3 (three) times daily.  30 capsule  1  . clobetasol cream (TEMOVATE) 0.05 % as directed.       . colchicine 0.6 MG tablet Take 0.6 mg by mouth daily.      . dorzolamide (TRUSOPT) 2 % ophthalmic solution Place 1 drop into both eyes 2 (two) times daily.      . furosemide (LASIX) 40 MG tablet Take 40 mg by mouth daily.       Marland Kitchen latanoprost (XALATAN) 0.005 % ophthalmic solution Place 1 drop into both eyes at bedtime.       Marland Kitchen losartan (COZAAR) 100 MG tablet Take 100 mg by mouth daily.       . metoprolol succinate (TOPROL-XL) 25 MG 24 hr tablet TAKE 1 TABLET ONCE A DAY  30 tablet  12  . Omega-3 Fatty Acids (FISH OIL) 1000 MG CAPS Take 1,000 mg by mouth daily.      . pantoprazole (PROTONIX) 40 MG tablet Take 40 mg by mouth at bedtime.       . predniSONE (DELTASONE) 5 MG tablet Take 5 mg  by mouth.       . warfarin (COUMADIN) 5 MG tablet TAKE ONE TABLET BY MOUTH ONCE DAILY  30 tablet  2   No current facility-administered medications on file prior to visit.     REVIEW OF SYSTEMS: See history of present illness, otherwise all systems negative  PHYSICAL EXAMINATION: General: The patient appears their stated age.  Vital signs are BP 165/63  Pulse 65  Resp 16  Ht 5\' 2"  (1.575 m)  Wt 141 lb (63.957 kg)  BMI 25.78 kg/m2  SpO2 98% HEENT:  No gross abnormalities Pulmonary: Respirations are non-labored Abdomen: Soft and non-tender  Musculoskeletal: There are no major deformities.   Neurologic: No focal weakness or paresthesias are detected, Skin: There are no ulcer or rashes noted. Bluish discoloration with lipodermatosclerosis Psychiatric: The patient has normal affect. Cardiovascular: There is a regular rate and rhythm without significant murmur appreciated. No carotid bruits. Palpable pedal pulses.  Diagnostic Studies: Reflux evaluation was performed today. The patient has bilateral superficial system reflux and bilateral deep system reflux with chronic DVT on the left.    Assessment:  Bilateral venous insufficiency, deep and superficial systems Left leg DVT Plan: #1: DVT: The patient is scheduled to see hematology within the next 2 weeks. I would defer recommendations for anticoagulation once their workup has been completed. If this was truly a spontaneous DVT in the left leg, 6 months of Coumadin should be adequate. However, she does give a history of a possible previous DVT which raises the question that she may have some underlying hypercoagulable state which would require longer anticoagulation is not lifelong.  #2: Bilateral venous insufficiency. Based on the size of her saphenous vein bilaterally, I do not think she is a great candidate for endovenous laser ablation. In addition this would not completely cure her venous insufficiency as she has deep system reflux  as well. I have recommended compression therapy. I have told her to upgrade to a 20-30 mm compression stockings.  The patient will contact me on a when necessary basis.     Eldridge Abrahams, M.D. [facility] Office: [phone] Pager:  [phone]

## 2013-03-25 ENCOUNTER — Telehealth: Payer: Self-pay | Admitting: *Deleted

## 2013-03-26 ENCOUNTER — Ambulatory Visit (INDEPENDENT_AMBULATORY_CARE_PROVIDER_SITE_OTHER): Payer: Medicare Other

## 2013-03-26 ENCOUNTER — Ambulatory Visit (INDEPENDENT_AMBULATORY_CARE_PROVIDER_SITE_OTHER): Payer: Medicare Other | Admitting: Family Medicine

## 2013-03-26 ENCOUNTER — Encounter: Payer: Self-pay | Admitting: Family Medicine

## 2013-03-26 VITALS — BP 142/59 | HR 64 | Temp 97.4°F | Ht 62.0 in | Wt 140.0 lb

## 2013-03-26 DIAGNOSIS — R0600 Dyspnea, unspecified: Secondary | ICD-10-CM

## 2013-03-26 DIAGNOSIS — I10 Essential (primary) hypertension: Secondary | ICD-10-CM

## 2013-03-26 DIAGNOSIS — E785 Hyperlipidemia, unspecified: Secondary | ICD-10-CM

## 2013-03-26 DIAGNOSIS — E559 Vitamin D deficiency, unspecified: Secondary | ICD-10-CM

## 2013-03-26 DIAGNOSIS — R0609 Other forms of dyspnea: Secondary | ICD-10-CM

## 2013-03-26 DIAGNOSIS — R0989 Other specified symptoms and signs involving the circulatory and respiratory systems: Secondary | ICD-10-CM

## 2013-03-26 DIAGNOSIS — M109 Gout, unspecified: Secondary | ICD-10-CM

## 2013-03-26 DIAGNOSIS — R0602 Shortness of breath: Secondary | ICD-10-CM

## 2013-03-26 DIAGNOSIS — N184 Chronic kidney disease, stage 4 (severe): Secondary | ICD-10-CM

## 2013-03-26 DIAGNOSIS — I82409 Acute embolism and thrombosis of unspecified deep veins of unspecified lower extremity: Secondary | ICD-10-CM

## 2013-03-26 LAB — POCT CBC
Hemoglobin: 12 g/dL — AB (ref 12.2–16.2)
Lymph, poc: 2.4 (ref 0.6–3.4)
MCH, POC: 29 pg (ref 27–31.2)
MCHC: 32.6 g/dL (ref 31.8–35.4)
MPV: 7.2 fL (ref 0–99.8)
POC Granulocyte: 4.5 (ref 2–6.9)
POC LYMPH PERCENT: 32.2 %L (ref 10–50)
Platelet Count, POC: 335 10*3/uL (ref 142–424)

## 2013-03-26 LAB — HEPATIC FUNCTION PANEL
Albumin: 4.1 g/dL (ref 3.5–5.2)
Alkaline Phosphatase: 74 U/L (ref 39–117)
Total Bilirubin: 0.6 mg/dL (ref 0.3–1.2)
Total Protein: 6.4 g/dL (ref 6.0–8.3)

## 2013-03-26 LAB — THYROID PANEL WITH TSH
Free Thyroxine Index: 2 (ref 1.0–3.9)
T3 Uptake: 29.3 % (ref 22.5–37.0)
T4, Total: 6.9 ug/dL (ref 5.0–12.5)
TSH: 0.814 u[IU]/mL (ref 0.350–4.500)

## 2013-03-26 LAB — BASIC METABOLIC PANEL WITH GFR
BUN: 29 mg/dL — ABNORMAL HIGH (ref 6–23)
Calcium: 9.8 mg/dL (ref 8.4–10.5)
Creat: 1.74 mg/dL — ABNORMAL HIGH (ref 0.50–1.10)
GFR, Est Non African American: 27 mL/min — ABNORMAL LOW

## 2013-03-26 NOTE — Progress Notes (Signed)
  Subjective:    Patient ID: Elizabeth Burch, female    DOB: Apr 11, 1926, 79 y.o.   MRN: [id number]  HPI Since the most recent visit Elizabeth Burch has visited the vascular surgeon. His note indicates persistent phlebitis in this leg. He thinks that she may have a clotting disorder and should see a hematologist, Dr. Alen Blew . She has a visit scheduled within the next 2 weeks. She has ongoing problems with gout. She is   unable to take many of the medications do to allergy. See review of systems.  Review of Systems  Constitutional: Negative.   HENT: Negative.   Eyes: Negative.   Respiratory: Positive for shortness of breath (when lying down). Negative for cough, chest tightness and wheezing.   Cardiovascular: Positive for leg swelling (bilateral).  Gastrointestinal: Positive for diarrhea (due to meds).  Genitourinary: Negative.   Musculoskeletal: Positive for back pain.  Skin: Negative.   Allergic/Immunologic: Negative.   Neurological: Negative.   Psychiatric/Behavioral: Positive for sleep disturbance (occasional).   Her blood pressures at home generally average in the 120s over the upper 50s to 60.     Objective:   Physical Exam BP 142/59  Pulse 64  Temp(Src) 97.4 F (36.3 C) (Oral)  Ht 5\' 2"  (1.575 m)  Wt 140 lb (63.504 kg)  BMI 25.6 kg/m2  The patient appeared well nourished and normally developed, alert and oriented to time and place. Speech, behavior and judgement appear normal. Vital signs as documented.  Head exam is unremarkable. No scleral icterus or pallor noted.  Neck is without jugular venous distension, thyromegally, or carotid bruits. Carotid upstrokes are brisk bilaterally. No cervical adenopathy. Lungs are clear anteriorly and posteriorly to auscultation. Normal respiratory effort. Cardiac exam reveals regular rate and rhythm@ 60/min. First and second heart sounds normal. No murmurs, rubs or gallops.  Abdominal exam reveals normal bowl sounds, no masses, no organomegaly  and no aortic enlargement. No inguinal adenopathy. Extremities are nonedematous and both femoral and pedal pulses are normal. Less redness and swelling today and lower extremities especially on the left side Skin without pallor or jaundice.  Warm and dry, without rash. Neurologic exam reveals normal deep tendon reflexes and normal sensation.  [facility] reading (PRIMARY) by  Dr. Laurance Flatten : Kyphosis and bronchitic changes                                       Assessment & Plan:  1. DVT (deep venous thrombosis), unspecified laterality - POCT CBC; Standing - BASIC METABOLIC PANEL WITH GFR; Standing  2. Chronic kidney disease (CKD), stage IV (severe) - BASIC METABOLIC PANEL WITH GFR; Standing  3. Gout - Uric acid - BASIC METABOLIC PANEL WITH GFR; Standing  4. Essential hypertension, benign - BASIC METABOLIC PANEL WITH GFR; Standing  5. HYPERLIPIDEMIA - Hepatic function panel; Standing - NMR Lipoprofile with Lipids; Standing  6. SOB (shortness of breath) - Thyroid Panel With TSH - Brain natriuretic peptide  7. Vitamin D deficiency - Vitamin D 25 hydroxy; Standing  8. PND (paroxysmal nocturnal dyspnea) - DG Chest 2 View; Future

## 2013-03-26 NOTE — Patient Instructions (Addendum)
FOBT given Continue current meds and therapeutic lifestyle changes Breast check after mammogram

## 2013-03-27 LAB — BRAIN NATRIURETIC PEPTIDE: Brain Natriuretic Peptide: 247.2 pg/mL — ABNORMAL HIGH (ref 0.0–100.0)

## 2013-03-27 LAB — NMR LIPOPROFILE WITH LIPIDS
HDL Particle Number: 48.8 umol/L (ref >=30.5)
HDL Size: 9.6 nm (ref >=9.2)
HDL-C: 83 mg/dL (ref >=40)
LDL (calc): 114 mg/dL — ABNORMAL HIGH (ref <100)
LDL Particle Number: 1387 nmol/L — ABNORMAL HIGH (ref <1000)
LDL Size: 20.8 nm (ref >20.5)
LP-IR Score: <25 (ref <=45)
Small LDL Particle Number: 503 nmol/L (ref <=527)
VLDL Size: 38.9 nm (ref <=46.6)

## 2013-03-27 LAB — VITAMIN D 25 HYDROXY (VIT D DEFICIENCY, FRACTURES): Vit D, 25-Hydroxy: 34 ng/mL (ref 30–89)

## 2013-03-28 ENCOUNTER — Other Ambulatory Visit (HOSPITAL_COMMUNITY): Payer: Self-pay | Admitting: Cardiology

## 2013-04-02 ENCOUNTER — Telehealth (INDEPENDENT_AMBULATORY_CARE_PROVIDER_SITE_OTHER): Payer: Self-pay

## 2013-04-02 NOTE — Telephone Encounter (Signed)
V/M Yr F/U is due please call to schedule appt with DR. [employer]

## 2013-04-03 ENCOUNTER — Ambulatory Visit (INDEPENDENT_AMBULATORY_CARE_PROVIDER_SITE_OTHER): Payer: Medicare Other | Admitting: Pharmacist

## 2013-04-03 DIAGNOSIS — I82409 Acute embolism and thrombosis of unspecified deep veins of unspecified lower extremity: Secondary | ICD-10-CM

## 2013-04-03 LAB — POCT INR: INR: 2.5

## 2013-04-03 MED ORDER — FEBUXOSTAT 40 MG PO TABS: 40.0000 mg | ORAL_TABLET | Freq: Every day | ORAL | Status: DC

## 2013-04-03 NOTE — Patient Instructions (Signed)
Anticoagulation Dose Instructions as of 04/03/2013     Sun Mon Tue Wed Thu Fri Sat   New Dose 2.5 mg 5 mg 2.5 mg 2.5 mg 2.5 mg 5 mg 2.5 mg    Description       Continue 2.5mg  daily except 5mg  Mondays and Fridays.        INR was 2.5 today

## 2013-04-04 ENCOUNTER — Other Ambulatory Visit: Payer: Medicare Other

## 2013-04-04 ENCOUNTER — Other Ambulatory Visit: Payer: Self-pay | Admitting: Oncology

## 2013-04-04 DIAGNOSIS — I82409 Acute embolism and thrombosis of unspecified deep veins of unspecified lower extremity: Secondary | ICD-10-CM

## 2013-04-04 DIAGNOSIS — Z1212 Encounter for screening for malignant neoplasm of rectum: Secondary | ICD-10-CM

## 2013-04-05 LAB — FECAL OCCULT BLOOD, IMMUNOCHEMICAL: Fecal Occult Blood: NEGATIVE

## 2013-04-07 ENCOUNTER — Telehealth: Payer: Self-pay | Admitting: *Deleted

## 2013-04-07 ENCOUNTER — Ambulatory Visit (HOSPITAL_COMMUNITY)
Admission: RE | Admit: 2013-04-07 | Discharge: 2013-04-07 | Disposition: A | Discharge status: Home / Self Care | Payer: Medicare Other | Source: Ambulatory Visit | Attending: Surgery | Admitting: Surgery

## 2013-04-07 DIAGNOSIS — Z1231 Encounter for screening mammogram for malignant neoplasm of breast: Secondary | ICD-10-CM | POA: Insufficient documentation

## 2013-04-07 NOTE — Telephone Encounter (Signed)
Called pt to confirm appt 04/08/13. LMOVM to call back

## 2013-04-08 ENCOUNTER — Telehealth: Payer: Self-pay | Admitting: Oncology

## 2013-04-08 ENCOUNTER — Ambulatory Visit: Payer: Medicare Other

## 2013-04-08 ENCOUNTER — Other Ambulatory Visit (HOSPITAL_BASED_OUTPATIENT_CLINIC_OR_DEPARTMENT_OTHER): Payer: Medicare Other | Admitting: Lab

## 2013-04-08 ENCOUNTER — Ambulatory Visit (HOSPITAL_BASED_OUTPATIENT_CLINIC_OR_DEPARTMENT_OTHER): Payer: Medicare Other | Admitting: Oncology

## 2013-04-08 ENCOUNTER — Encounter: Payer: Self-pay | Admitting: Oncology

## 2013-04-08 VITALS — BP 174/54 | HR 85 | Temp 97.9°F | Resp 18 | Ht 62.0 in | Wt 142.9 lb

## 2013-04-08 DIAGNOSIS — I82409 Acute embolism and thrombosis of unspecified deep veins of unspecified lower extremity: Secondary | ICD-10-CM

## 2013-04-08 DIAGNOSIS — I82402 Acute embolism and thrombosis of unspecified deep veins of left lower extremity: Secondary | ICD-10-CM

## 2013-04-08 NOTE — Progress Notes (Signed)
Note dictated

## 2013-04-08 NOTE — Telephone Encounter (Signed)
gv and printed appt sched and avs for pt for May °

## 2013-04-08 NOTE — Progress Notes (Signed)
Checked in new pt with no financial concerns. °

## 2013-04-09 LAB — HYPERCOAGULABLE PANEL, COMPREHENSIVE
Anticardiolipin IgA: 3 APL U/mL (ref <22)
Anticardiolipin IgG: 4 GPL U/mL (ref <23)
DRVVT: 42.3 secs (ref <42.9)
Lupus Anticoagulant: NOT DETECTED
PTTLA 4:1 Mix: 43.7 secs — ABNORMAL HIGH (ref 28.0–43.0)
PTTLA Confirmation: 6.9 secs (ref <8.0)
Protein C Activity: 40 % — ABNORMAL LOW (ref 75–133)

## 2013-04-10 NOTE — Progress Notes (Signed)
CC:   Chipper Herb, M.D.  REASON FOR CONSULTATION:  History of DVT.  HISTORY OF PRESENT ILLNESS:  Mrs. Norfolk is a pleasant 79 year old woman currently of [place], [place], lived the majority of her life around that area.  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  She does have a past medical history significant for hypertension and gout, and developed a spontaneous DVT back in October 2013.  At that time, she started developing left leg swelling around July and slowly that swelling continued to bother her until a Doppler was done and confirmed the presence of a deep vein thrombosis in the left lower leg deep vein.  That was on 09/17/2012.  The patient has been chronically anticoagulated on Coumadin since that time.  The patient reports that she had a blood clot about 63 years ago after she was pregnant and gave birth to her son.  She underwent a C-section and she developed swelling of both legs and she was told clinically that she presumably had blood clots.  She was not treated with any blood thinner that she can recall over 60 years ago.  In between those 2 episodes she has never really had any blood clots.  She has never had any phlebitis. She does occasionally have swelling in the legs.  She does report as well a history of gout.  Since her recent episode she has been doing well on Coumadin.  She has had some swelling in her legs and she wears compression stockings.  She also has had right leg pain and she also had Doppler that was done at [facility] which showed an evidence of chronic deep vein thrombosis bilaterally. There is significant superficial venous reflux in the right more than the left saphenous vein.  There is evidence of deep venous reflux bilaterally and the small saphenous vein is patent bilaterally.  Since then, she has not really  had any other symptoms.  The patient was also evaluated by Dr. Trula Slade of Vascular Surgery and no intervention was recommended at that time.  Clinically, she feels very well.  She has not really had any weight loss, has not had any abdominal pain, has not had any lymphadenopathy.  She is up to speed on her age-appropriate cancer screening.  REVIEW OF SYSTEMS:  Does not report any headaches, blurry vision, double vision.  Does not report any motor or sensory neuropathy.  Does not report any alteration in mental status.  Does not report any psychiatric issues, depression.  Does not report any fever, chills, sweats.  Does not report any cough, hemoptysis, hematemesis.  No nausea, vomiting, abdominal pain, hematochezia, melena.  No genitourinary complaints. Rest of review of systems is unremarkable.  PAST MEDICAL HISTORY:  Significant for hypertension, hyperlipidemia, gout, history of chronic renal insufficiency, history of palpitation, and also glaucoma.  She has also had history of the DVTs as mentioned.  FAMILY HISTORY:  Her mother had cancer, although unclear what kind, presumably colon cancer.  She has 3 sisters who have breast cancer.  SOCIAL HISTORY:  She is married.  She has 1 son.  One pregnancy.  Denied any alcohol or tobacco abuse.  Currently retired.  MEDICATIONS:  She is on amlodipine,colchicine, Trusopt, Lasix, Xalatan, Cozaar, Toprol, omega fish oil, Protonix, and Coumadin.  PHYSICAL EXAMINATION:  General:  Alert, awake, pleasant woman, appeared in no active distress.  Vital  Signs:  Blood pressure is 174/54, pulse 84, respirations 18, temperature 97.  She weighs 142 pounds.  She is 99% on room air.  HEENT:  Head is normocephalic, atraumatic.  Pupils equal and round, reactive to light.  Oral mucosa moist and pink.  Neck: Supple without lymphadenopathy.  Heart:  Regular rate and rhythm, S1 and S2.  Lungs:  Clear to auscultation without rhonchi, wheeze, dullness  to percussion.  Abdomen:  Soft, nontender.  No hepatosplenomegaly. Extremities:  No clubbing, cyanosis, or edema.  Neurological:  Intact motor, sensory, and deep tendon reflexes.  LABORATORY DATA:  Her hemoglobin is 12, white cell count 7.4.  She had normal platelets.  Her hypercoagulable workup is currently pending.  ASSESSMENT AND PLAN:  An 79 year old woman with a recently diagnosed spontaneous left deep vein thrombosis.  It appears to be unprovoked at this time.  She really denied any provoking symptoms such as trauma, immobilization, surgery, or hormonal replacement.  I do not really see any evidence to suggest advanced malignancy.  She is really up to speed on age-appropriate cancer screening and completely asymptomatic.  She has a curious history of presumed blood clots 60 years ago after the birth of her son and there a really undetermined episode in the early 2000s, although it could be evidence of phlebitis. I had a lengthy discussion today with Mrs. Mccamish, discussing the natural course of inherited as well as acquired thrombophilia and talked to her about her risk of developing subsequent blood clots should she discontinue anticoagulation.  At this point, her risk of this current blood clot is unclear to me.  There is nothing to suggest an acquired event and for that reason, a hypercoagulable panel will be helpful to see if she has an acquired antiphospholipid antibody syndrome that might have contributed to her recent thrombosis.  Again, I doubt there is a malignancy involved and again, a familial thrombophilia I guess is possible, but it is difficult to see this manifesting itself after 79 years old.  She did have that 1 episode 60 years ago.  I guess one could presume she had familial thrombophilia related to that.  In terms of my recommendations at this point, I will obtain her hypercoagulable workup and will discuss these findings with her at her next visit and  depending on these findings, I will give my final recommendation.  At this point, given her preference and her presentation, I lean towards lifetime anticoagulation at this point, but I will give my final recommendation after I discuss her hypercoagulable workup.  All her questions were answered today.    ______________________________ Wyatt Portela, M.D. FNS/MEDQ  D:  04/08/2013  T:  04/09/2013  Job:  [id number]

## 2013-05-02 ENCOUNTER — Ambulatory Visit (HOSPITAL_BASED_OUTPATIENT_CLINIC_OR_DEPARTMENT_OTHER): Payer: Medicare Other | Admitting: Oncology

## 2013-05-02 VITALS — BP 160/46 | HR 79 | Temp 97.9°F | Resp 18 | Ht 62.0 in | Wt 139.3 lb

## 2013-05-02 DIAGNOSIS — Z7901 Long term (current) use of anticoagulants: Secondary | ICD-10-CM

## 2013-05-02 DIAGNOSIS — I82409 Acute embolism and thrombosis of unspecified deep veins of unspecified lower extremity: Secondary | ICD-10-CM

## 2013-05-02 NOTE — Progress Notes (Signed)
Hematology and Oncology Follow Up Visit  Elizabeth Burch [id number] 11/25/1926 79 y.o. 05/02/2013 3:19 PM   Principle Diagnosis: 79 year old woman diagnosed with deep vein thrombosis on 09/17/2012 that was unprovoked. She has also a remote history of deep vein thrombosis dating back close to 60 years ago.  Current therapy: Chronic anticoagulation with Coumadin.  Interim History:  Elizabeth Burch presents today for a followup visit. She is a very nice woman I saw in consultation back in 04/08/2013 an evaluation for of deep vein thrombosis. She had unprovoked episode in October 2013 and have been doing well on Coumadin since then. Since her last visit, she has not had any complaints. She had not reported any bleeding she did not report any epistaxis, hematochezia, melena, hemoptysis or hematemesis. Her INR levels have been therapeutic without really any major concerns.  Medications: I have reviewed the patient's current medications.  Current Outpatient Prescriptions  Medication Sig Dispense Refill  . amLODipine (NORVASC) 5 MG tablet TAKE ONE TABLET BY MOUTH EVERY DAY  30 tablet  11  . colchicine 0.6 MG tablet Take 0.6 mg by mouth daily.      . dorzolamide (TRUSOPT) 2 % ophthalmic solution Place 1 drop into both eyes 2 (two) times daily.      . febuxostat (ULORIC) 40 MG tablet Take 1 tablet (40 mg total) by mouth daily.  14 tablet  0  . furosemide (LASIX) 40 MG tablet Take 40 mg by mouth daily.       Marland Kitchen latanoprost (XALATAN) 0.005 % ophthalmic solution Place 1 drop into both eyes at bedtime.       Marland Kitchen losartan (COZAAR) 100 MG tablet Take 100 mg by mouth daily.       . metoprolol succinate (TOPROL-XL) 25 MG 24 hr tablet TAKE 1 TABLET ONCE A DAY  30 tablet  12  . Omega-3 Fatty Acids (FISH OIL) 1000 MG CAPS Take 1,000 mg by mouth daily.      . pantoprazole (PROTONIX) 40 MG tablet Take 40 mg by mouth at bedtime.       Marland Kitchen warfarin (COUMADIN) 5 MG tablet TAKE ONE TABLET BY MOUTH ONCE DAILY  30 tablet  2    No current facility-administered medications for this visit.     Allergies:  Allergies  Allergen Reactions  . Prednisone Hypertension  . Ace Inhibitors Other (See Comments)    unknown  . Allopurinol   . Uloric (Febuxostat)   . Alendronate Sodium Rash  . Ciprofloxacin Rash  . Clarithromycin Rash  . Clindamycin/Lincomycin Rash  . Nitrofurantoin Rash  . Ofloxacin Rash  . Penicillins Rash  . Sulfamethoxazole Rash  . Sulfonamide Derivatives Rash    Past Medical History, Surgical history, Social history, and Family History were reviewed and updated.  Review of Systems: Comprehensive review of systems is essentially negative.  Physical Exam: Blood pressure 160/46, pulse 79, temperature 97.9 F (36.6 C), temperature source Oral, resp. rate 18, height 5\' 2"  (1.575 m), weight 139 lb 4.8 oz (63.186 kg), SpO2 99.00%. ECOG: 0 General appearance: alert Head: Normocephalic, without obvious abnormality, atraumatic Neck: no adenopathy, no carotid bruit, no JVD, supple, symmetrical, trachea midline and thyroid not enlarged, symmetric, no tenderness/mass/nodules Lymph nodes: Cervical, supraclavicular, and axillary nodes normal. Heart:regular rate and rhythm, S1, S2 normal, no murmur, click, rub or gallop Lung:chest clear, no wheezing, rales, normal symmetric air entry Abdomin: soft, non-tender, without masses or organomegaly EXT:no erythema, induration, or nodules   Lab Results: Lab Results  Component Value Date  WBC 7.4 03/26/2013   HGB 12.0* 03/26/2013   HCT 36.7* 03/26/2013   MCV 89.0 03/26/2013   PLT 223 08/30/2011     Chemistry      Component Value Date/Time   NA 144 03/26/2013 0911   K 4.3 03/26/2013 0911   CL 108 03/26/2013 0911   CO2 24 03/26/2013 0911   BUN 29* 03/26/2013 0911   CREATININE 1.74* 03/26/2013 0911   CREATININE 1.80* 08/30/2011 1357      Component Value Date/Time   CALCIUM 9.8 03/26/2013 0911   ALKPHOS 74 03/26/2013 0911   AST 21 03/26/2013 0911   ALT 14  03/26/2013 0911   BILITOT 0.6 03/26/2013 0911        Impression and Plan:  79 year old woman with a history of 2 episodes of unprovoked deep vein thrombosis the most recent of which was in October of 2013. She is chronically anticoagulated on Coumadin without any complications. Her hypercoagulable panel was obtained on 04/09/2013 was discussed was essentially unremarkable. She has slight decrease in protein S and protein C probably related to Coumadin therapy. Otherwise no apparent thrombophilia was detected.  There is a recommendation of anticoagulations: I discussed the risks and benefits of continuing Coumadin versus stopping it today and overall I feel the risk of stopping Coumadin outweighs complications of continuing at this time. I feel given the fact that she has to unprovoked clots at increased risk for any future clots. At this time she prefers to continue on Coumadin. Risk of bleeding were also discussed including risk of serious hemorrhagic stroke associated with chronic Coumadin therapy but that risk is small and is acceptable by her at this time.  At this time, recommend she continue on lifetime anticoagulation unless she develops complications from Coumadin down the line. Should that happen, I will be happy to see her again and reevaluate the decision at that time.  No followup visit will be scheduled to be happy to see her as needed.  Zola Button, MD 5/30/20143:19 PM

## 2013-05-05 ENCOUNTER — Ambulatory Visit (INDEPENDENT_AMBULATORY_CARE_PROVIDER_SITE_OTHER): Payer: Medicare Other | Admitting: Pharmacist

## 2013-05-05 DIAGNOSIS — I82409 Acute embolism and thrombosis of unspecified deep veins of unspecified lower extremity: Secondary | ICD-10-CM

## 2013-05-05 LAB — POCT INR: INR: 2.2

## 2013-05-05 NOTE — Patient Instructions (Signed)
Anticoagulation Dose Instructions as of 05/05/2013     Elizabeth Burch Tue Wed Thu Fri Sat   New Dose 2.5 mg 5 mg 2.5 mg 2.5 mg 2.5 mg 5 mg 2.5 mg    Description       Continue 2.5mg  daily except 5mg  Mondays and Fridays.        INR was 2.2 today

## 2013-05-06 ENCOUNTER — Encounter: Payer: Self-pay | Admitting: *Deleted

## 2013-05-22 ENCOUNTER — Ambulatory Visit (INDEPENDENT_AMBULATORY_CARE_PROVIDER_SITE_OTHER): Payer: Medicare Other | Admitting: Surgery

## 2013-05-22 VITALS — BP 136/74 | HR 78 | Temp 98.0°F | Resp 18 | Ht 63.0 in | Wt 139.0 lb

## 2013-05-22 DIAGNOSIS — N6012 Diffuse cystic mastopathy of left breast: Secondary | ICD-10-CM

## 2013-05-22 DIAGNOSIS — N6019 Diffuse cystic mastopathy of unspecified breast: Secondary | ICD-10-CM

## 2013-05-22 DIAGNOSIS — N6011 Diffuse cystic mastopathy of right breast: Secondary | ICD-10-CM

## 2013-05-22 NOTE — Progress Notes (Addendum)
[place], MD,  [address]  [place], [place]    [place] Phone:  [phone] FAX:  [phone]   Re:   Elizabeth Burch DOB:   11/14/1926 MRN:   [id number]  ASSESSMENT AND PLAN: 1.  Breast mastopathy  No new mass or problem.  Negative mammogram.  She'll see me in 1 year.  Though she is old enough and doing well enough, I can make her next visit my last.  I will talk to her about this.  2.  Multinodular thyroid gland.  Normal thyroid function.  [place] unchanged on  10/11/2011.  Stable.  No further [place] unless there is a change in her exam.   3.  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  Taken care of by Dr. Rigoberto Noel at [facility].  He has released her. 4.  Hypertension. 5.  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 Amil Amen 6.  She asked about the next colonoscopy.    Dr. Sharlett Iles did one in 2003.  I did a right colectomy for benign peritoneal cysts.  I would look at the next colonoscopy around 10 years from my surgery - 2116, if her health is good.  This could be discussed at that time. 7.  Chronic renal disease, stage 3  Seen by Dr. Mercy Moore - 10/07/2012. 8.  Left leg DVT in fall of 2013.  Saw Dr. Solon Palm.  (last seen by him 04/09/2013)  He has suggested long term coumadin treatment. 9.  Right shoulder droops  HISTORY OF PRESENT ILLNESS: Chief Complaint  Patient presents with  . Breast Cancer Long Term Follow Up    Elizabeth Burch is a 79 y.o. (DOB: 06/19/1926)  white female who is a patient of MOORE, Elenore Rota, MD and comes to me today for check thyroid and breast check.  I follow the patient for diffuse mastopathy. She had 3 sisters who had breast cancer. She originally was followed by Dr. Gretta Cool.  I also check her thyroid. She has no new breast or thyroid complaints.  Her biggest problem has been with gout in her hands and she developed a DVT in her left calf last year.  She is on coumadin, has recently seen Dr. Jerilynn Mages.  PHYSICAL  EXAM: BP 136/74  Pulse 78  Temp(Src) 98 F (36.7 C)  Resp 18  Ht 5\' 3"  (1.6 m)  Wt 139 lb (63.05 kg)  BMI 24.63 kg/m2  HEENT:  Pupils equal.  Dentition good.  No injury. NECK: No specific mass in thyroid, but lumpy. LYMPH NODES:  No cervical, supraclavicular, or axillary adenopathy. Lungs:  Clear Heart:  RRR.  No murmur. BREASTS -  RIGHT:  No palpable mass or nodule.  No nipple discharge.   LEFT:  No palpable mass or nodule.  No nipple discharge. UPPER EXTREMITIES:  No evidence of lymphedema. Extremities - Nodularity of both hands/fingers secondary to gout.  Right shoulder droops, her bra strap falls off.  DATA REVIEWED: Mammogram [facility]) - 04/07/2013 - negative  Alphonsa Overall, MD, [place] Office:  [phone]

## 2013-06-09 ENCOUNTER — Ambulatory Visit (INDEPENDENT_AMBULATORY_CARE_PROVIDER_SITE_OTHER): Payer: Medicare Other | Admitting: Pharmacist

## 2013-06-09 DIAGNOSIS — I82409 Acute embolism and thrombosis of unspecified deep veins of unspecified lower extremity: Secondary | ICD-10-CM

## 2013-06-09 LAB — POCT INR: INR: 2.4

## 2013-06-09 NOTE — Patient Instructions (Addendum)
Anticoagulation Dose Instructions as of 06/09/2013     Elizabeth Burch Tue Wed Thu Fri Sat   New Dose 2.5 mg 5 mg 2.5 mg 2.5 mg 2.5 mg 5 mg 2.5 mg    Description       Continue 2.5mg  daily except 5mg  Mondays and Fridays.        INR was 2.4 today

## 2013-07-14 ENCOUNTER — Ambulatory Visit (INDEPENDENT_AMBULATORY_CARE_PROVIDER_SITE_OTHER): Payer: Medicare Other | Admitting: Pharmacist

## 2013-07-14 DIAGNOSIS — I82409 Acute embolism and thrombosis of unspecified deep veins of unspecified lower extremity: Secondary | ICD-10-CM

## 2013-07-14 LAB — POCT INR: INR: 2.4

## 2013-07-14 NOTE — Patient Instructions (Addendum)
Anticoagulation Dose Instructions as of 07/14/2013     [name] Tue Wed Thu Fri Sat   New Dose 2.5 mg 5 mg 2.5 mg 2.5 mg 2.5 mg 5 mg 2.5 mg    Description       Continue 2.5mg  daily except 5mg  Mondays and Fridays.        INR was 2.4 today

## 2013-07-21 ENCOUNTER — Other Ambulatory Visit: Payer: Self-pay | Admitting: Family Medicine

## 2013-07-22 ENCOUNTER — Other Ambulatory Visit (INDEPENDENT_AMBULATORY_CARE_PROVIDER_SITE_OTHER): Payer: Medicare Other

## 2013-07-22 DIAGNOSIS — E785 Hyperlipidemia, unspecified: Secondary | ICD-10-CM

## 2013-07-22 DIAGNOSIS — E559 Vitamin D deficiency, unspecified: Secondary | ICD-10-CM

## 2013-07-22 DIAGNOSIS — M109 Gout, unspecified: Secondary | ICD-10-CM

## 2013-07-22 DIAGNOSIS — N184 Chronic kidney disease, stage 4 (severe): Secondary | ICD-10-CM

## 2013-07-22 DIAGNOSIS — I82409 Acute embolism and thrombosis of unspecified deep veins of unspecified lower extremity: Secondary | ICD-10-CM

## 2013-07-22 DIAGNOSIS — I10 Essential (primary) hypertension: Secondary | ICD-10-CM

## 2013-07-22 LAB — POCT CBC
Granulocyte percent: 65.7 %G (ref 37–80)
HCT, POC: 35.8 % — AB (ref 37.7–47.9)
Hemoglobin: 11.7 g/dL — AB (ref 12.2–16.2)
MCV: 87.4 fL (ref 80–97)
POC Granulocyte: 5.5 (ref 2–6.9)
POC LYMPH PERCENT: 28 %L (ref 10–50)
RBC: 4.1 M/uL (ref 4.04–5.48)

## 2013-07-22 NOTE — Progress Notes (Signed)
Pt came in for labs only 

## 2013-07-23 LAB — HEPATIC FUNCTION PANEL
ALT: 15 U/L (ref 0–35)
AST: 22 U/L (ref 0–37)
Alkaline Phosphatase: 57 U/L (ref 39–117)
Bilirubin, Direct: 0.1 mg/dL (ref 0.0–0.3)
Indirect Bilirubin: 0.4 mg/dL (ref 0.0–0.9)
Total Bilirubin: 0.5 mg/dL (ref 0.3–1.2)

## 2013-07-23 LAB — BASIC METABOLIC PANEL WITH GFR
BUN: 46 mg/dL — ABNORMAL HIGH (ref 6–23)
Chloride: 108 mEq/L (ref 96–112)
GFR, Est African American: 23 mL/min — ABNORMAL LOW
GFR, Est Non African American: 20 mL/min — ABNORMAL LOW
Glucose, Bld: 82 mg/dL (ref 70–99)
Potassium: 5.3 mEq/L (ref 3.5–5.3)
Sodium: 142 mEq/L (ref 135–145)

## 2013-07-23 LAB — NMR LIPOPROFILE WITH LIPIDS
Cholesterol, Total: 234 mg/dL — ABNORMAL HIGH (ref <200)
HDL Size: 9.5 nm (ref >=9.2)
HDL-C: 84 mg/dL (ref >=40)
Large HDL-P: 16.5 umol/L (ref >=4.8)
Large VLDL-P: 1.7 nmol/L (ref <=2.7)
Triglycerides: 82 mg/dL (ref <150)

## 2013-07-23 LAB — VITAMIN D 25 HYDROXY (VIT D DEFICIENCY, FRACTURES): Vit D, 25-Hydroxy: 40 ng/mL (ref 30–89)

## 2013-07-23 NOTE — Telephone Encounter (Signed)
LAST PT 8/14

## 2013-07-24 ENCOUNTER — Telehealth: Payer: Self-pay | Admitting: Family Medicine

## 2013-07-24 NOTE — Telephone Encounter (Signed)
Prescription renewed in EPIC. 

## 2013-07-28 ENCOUNTER — Ambulatory Visit (INDEPENDENT_AMBULATORY_CARE_PROVIDER_SITE_OTHER): Payer: Medicare Other | Admitting: Family Medicine

## 2013-07-28 ENCOUNTER — Telehealth: Payer: Self-pay | Admitting: Family Medicine

## 2013-07-28 ENCOUNTER — Encounter: Payer: Self-pay | Admitting: Family Medicine

## 2013-07-28 VITALS — BP 126/55 | HR 63 | Temp 97.2°F | Ht 62.0 in | Wt 138.0 lb

## 2013-07-28 DIAGNOSIS — N184 Chronic kidney disease, stage 4 (severe): Secondary | ICD-10-CM

## 2013-07-28 DIAGNOSIS — M109 Gout, unspecified: Secondary | ICD-10-CM

## 2013-07-28 DIAGNOSIS — Z860100 Personal history of colon polyps, unspecified: Secondary | ICD-10-CM

## 2013-07-28 DIAGNOSIS — E785 Hyperlipidemia, unspecified: Secondary | ICD-10-CM

## 2013-07-28 DIAGNOSIS — E559 Vitamin D deficiency, unspecified: Secondary | ICD-10-CM

## 2013-07-28 DIAGNOSIS — I10 Essential (primary) hypertension: Secondary | ICD-10-CM

## 2013-07-28 DIAGNOSIS — Z79899 Other long term (current) drug therapy: Secondary | ICD-10-CM

## 2013-07-28 NOTE — Progress Notes (Signed)
Subjective:    Patient ID: Elizabeth Burch, female    DOB: 1926/07/28, 79 y.o.   MRN: [id number]  HPI Patient returns to clinic today for followup management of chronic medical problems. Her recent labs were reviewed. Her cholesterol numbers have increased. And her kidney function tests have increased. A request was put in for her to see the nephrologist sooner than later and we will followup on this. The patient is intolerant to many medications including statins. Patient indicates her blood pressures have been doing well at home and that she has been feeling good.   Review of Systems  Constitutional: Negative for activity change, appetite change and fatigue.  HENT: Negative.  Negative for ear pain, congestion, sore throat, rhinorrhea, postnasal drip and sinus pressure.   Eyes: Negative for pain, discharge, redness, itching and visual disturbance.  Respiratory: Negative.  Negative for cough, choking, chest tightness, shortness of breath and wheezing.   Cardiovascular: Negative.  Negative for chest pain, palpitations and leg swelling.  Gastrointestinal: Negative.  Negative for nausea, vomiting, abdominal pain, diarrhea, constipation and blood in stool.  Endocrine: Negative.  Negative for cold intolerance, heat intolerance and polydipsia.  Genitourinary: Negative.  Negative for dysuria, urgency, frequency, hematuria, vaginal bleeding, vaginal discharge and vaginal pain.  Musculoskeletal: Positive for myalgias (lower legs occasionally) and back pain (mid thoracic occasionally). Negative for arthralgias.  Skin: Negative.  Negative for color change, pallor, rash and wound.  Allergic/Immunologic: Negative.   Neurological: Negative.  Negative for dizziness, tremors, weakness, light-headedness, numbness and headaches.  Psychiatric/Behavioral: Positive for sleep disturbance (occasional). Negative for confusion, decreased concentration and agitation. The patient is not nervous/anxious.         Objective:   Physical Exam BP 126/55  Pulse 63  Temp(Src) 97.2 F (36.2 C) (Oral)  Ht 5\' 2"  (1.575 m)  Wt 138 lb (62.596 kg)  BMI 25.23 kg/m2  The patient appeared well nourished and normally developed, alert and oriented to time and place. She has kyphosis. Speech, behavior and judgement appear normal. bilaterally. Vital signs as documented.  Head exam is unremarkable. No scleral icterus or pallor noted. Mouth and throat were normal. Ear canals were clear. Her was nasal congestion bilaterally. Neck is without jugular venous distension, thyromegally, or carotid bruits. Carotid upstrokes are brisk bilaterally. No cervical adenopathy. Lungs are clear anteriorly and posteriorly to auscultation. Normal respiratory effort. Cardiac exam reveals regular rate and rhythm at 72 per minute. First and second heart sounds normal.  No murmurs, rubs or gallops.  Abdominal exam reveals normal bowl sounds, no masses, no organomegaly and no aortic enlargement. No inguinal adenopathy. There is no abdominal tenderness. Extremities are nonedematous and both femoral and pedal pulses are normal. There was no joint swelling or tenderness. Skin without pallor or jaundice.  Warm and dry, without rash. Neurologic exam reveals normal deep tendon reflexes and normal sensation.          Assessment & Plan:  1. Hypertension  2. Hyperlipemia  3. Chronic kidney disease (CKD), stage IV (severe)  4. Vitamin D deficiency  5. Gout  6. High risk medication use  Patient Instructions  Fall precautions discussed Continue current meds and therapeutic lifestyle changes Return to clinic in September or October for a flu shot Try to do better with diet and exercise because of elevated cholesterol We will try to get an earlier visit with the nephrologist Continued vitamin D is doing and call us back with the dosage   Arrie Senate MD

## 2013-07-28 NOTE — Patient Instructions (Addendum)
Fall precautions discussed Continue current meds and therapeutic lifestyle changes Return to clinic in September or October for a flu shot Try to do better with diet and exercise because of elevated cholesterol We will try to get an earlier visit with the nephrologist Continued vitamin D is doing and call us back with the dosage

## 2013-07-29 ENCOUNTER — Encounter: Payer: Self-pay | Admitting: Gastroenterology

## 2013-07-29 ENCOUNTER — Telehealth: Payer: Self-pay | Admitting: *Deleted

## 2013-07-29 NOTE — Telephone Encounter (Signed)
Stopped by Dr Shary Decamp today- and DWM spoke with him, he is also supposed to call pt and set up appt with her.  PT AWARE TO STOP MED and WAIT for DR to call her!!-jhb

## 2013-07-29 NOTE — Telephone Encounter (Signed)
Medication list has been updated.

## 2013-07-31 ENCOUNTER — Encounter: Payer: Self-pay | Admitting: *Deleted

## 2013-08-04 ENCOUNTER — Other Ambulatory Visit: Payer: Self-pay | Admitting: Family Medicine

## 2013-08-05 ENCOUNTER — Encounter: Payer: Self-pay | Admitting: Gastroenterology

## 2013-08-05 ENCOUNTER — Other Ambulatory Visit (INDEPENDENT_AMBULATORY_CARE_PROVIDER_SITE_OTHER): Payer: Medicare Other

## 2013-08-05 ENCOUNTER — Ambulatory Visit (INDEPENDENT_AMBULATORY_CARE_PROVIDER_SITE_OTHER): Payer: Medicare Other | Admitting: Gastroenterology

## 2013-08-05 VITALS — BP 124/60 | HR 62 | Ht 62.0 in | Wt 140.0 lb

## 2013-08-05 DIAGNOSIS — N183 Chronic kidney disease, stage 3 unspecified: Secondary | ICD-10-CM

## 2013-08-05 DIAGNOSIS — Z9049 Acquired absence of other specified parts of digestive tract: Secondary | ICD-10-CM

## 2013-08-05 DIAGNOSIS — K5730 Diverticulosis of large intestine without perforation or abscess without bleeding: Secondary | ICD-10-CM

## 2013-08-05 DIAGNOSIS — K389 Disease of appendix, unspecified: Secondary | ICD-10-CM

## 2013-08-05 DIAGNOSIS — K388 Other specified diseases of appendix: Secondary | ICD-10-CM

## 2013-08-05 DIAGNOSIS — Z9889 Other specified postprocedural states: Secondary | ICD-10-CM

## 2013-08-05 NOTE — Patient Instructions (Signed)
Please follow up with Dr. Sharlett Iles in one year or as needed

## 2013-08-05 NOTE — Progress Notes (Signed)
History of Present Illness:  This is a 79 year old Caucasian female with chronic renal failure of unexplained etiology.  She is referred today for possible followup colonoscopy.  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  She had negative colonoscopy in October 2008.  She currently is anticoagulated because of deep vein thrombosis, and review of her labs shows a normal CBC a negative stool Hemoccult card.  She has no GI complaints whatsoever and denies specifically melena, hematochezia, gas, bloating, or bowel or regularity, upper GI or hepatobiliary complaints.  Her appetite is good her weight is stable.  She denies abuse of alcohol, cigarettes, or NSAIDs.  I have reviewed this patient's present history, medical and surgical past history, allergies and medications.     ROS:   All systems were reviewed and are negative unless otherwise stated in the HPI.  No history of gynecologic problems.    Physical Exam: Blood pressure 126/55, pulse 63 and regular, and weight 138. General well developed well nourished patient in no acute distress, appearing their stated age Eyes PERRLA, no icterus, fundoscopic exam per opthamologist Skin no lesions noted Neck supple, no adenopathy, no thyroid enlargement, no tenderness Chest clear to percussion and auscultation Heart no significant murmurs, gallops or rubs noted Abdomen no hepatosplenomegaly masses or tenderness, BS normal.  Her abdomen is slightly distended especially in the lower quadrants but I cannot feel any evidence of ascites, masses or tenderness.  Bowel sounds are nonobstructive. Extremities no acute joint lesions, edema, phlebitis or evidence of cellulitis Neurologic patient oriented x 3, cranial nerves intact, no focal neurologic deficits noted. Psychological mental status normal and normal affect.  Assessment and plan: Review of her record shows that she had a previous right hemicolectomy for a  mucocele of appendix.  Followup colonoscopy was unremarkable.  To me, she denies a family history of colon cancer.  Since she has no GI symptoms, is not anemic, has guaiac-negative stool, I would not recommend colonoscopy at her age with her medical problems and anticoagulation status.  Previous colonoscopy did show mild diverticulosis. She relates to me that her abdomen has been distended for some time.  She denies symptoms consistent with bacterial overgrowth syndrome.  Her previous colon surgery, Dr. Alphonsa Overall.  With her renal disease I advised her avoid all NSAIDs and other over-the-counter medications.  Was a pleasure she is very nice patient of Dr. Redge Gainer.      Please copy her primary care physician, referring physician, and pertinent subspecialists.

## 2013-08-05 NOTE — Progress Notes (Signed)
Pt came in for labs only 

## 2013-08-06 LAB — RENAL FUNCTION PANEL
Albumin: 4.5 g/dL (ref 3.5–4.7)
BUN/Creatinine Ratio: 22 (ref 11–26)
BUN: 44 mg/dL — ABNORMAL HIGH (ref 8–27)
CO2: 22 mmol/L (ref 18–29)
Calcium: 10.2 mg/dL (ref 8.6–10.2)
Creatinine, Ser: 2.03 mg/dL — ABNORMAL HIGH (ref 0.57–1.00)
GFR calc non Af Amer: 22 mL/min/{1.73_m2} — ABNORMAL LOW (ref >59)

## 2013-08-13 ENCOUNTER — Encounter: Payer: Self-pay | Admitting: Gastroenterology

## 2013-08-14 ENCOUNTER — Ambulatory Visit (INDEPENDENT_AMBULATORY_CARE_PROVIDER_SITE_OTHER): Payer: Medicare Other | Admitting: Pharmacist

## 2013-08-14 DIAGNOSIS — I82409 Acute embolism and thrombosis of unspecified deep veins of unspecified lower extremity: Secondary | ICD-10-CM

## 2013-08-14 LAB — POCT INR: INR: 2.9

## 2013-08-14 NOTE — Patient Instructions (Signed)
Anticoagulation Dose Instructions as of 08/14/2013     Elizabeth Burch Tue Wed Thu Fri Sat   New Dose 2.5 mg 5 mg 2.5 mg 2.5 mg 2.5 mg 5 mg 2.5 mg    Description       Continue 2.5mg  daily except 5mg  Mondays and Fridays.       INR was 2.9 today.

## 2013-08-25 ENCOUNTER — Telehealth: Payer: Self-pay

## 2013-08-25 DIAGNOSIS — I872 Venous insufficiency (chronic) (peripheral): Secondary | ICD-10-CM

## 2013-08-25 NOTE — Telephone Encounter (Signed)
Phone call from pt. with c/o right leg looks worse.  States she has a vein in the calf area that is very dark in color; "almost black."  Denies any bulging of the discolored vein, or increased redness/ tenderness.  Denies any open sores.  States her leg swelling is controlled since she is wearing compression stockings.  Does try to elevate her legs intermittently.   Requesting an appt. for reevaluation.  Discussed with Dr. Trula Slade.  Recommends to right lower extremity venous reflux study, and appt. with Dr. Kellie Simmering.

## 2013-08-26 ENCOUNTER — Encounter: Payer: Self-pay | Admitting: Vascular Surgery

## 2013-08-26 ENCOUNTER — Ambulatory Visit (INDEPENDENT_AMBULATORY_CARE_PROVIDER_SITE_OTHER): Payer: Medicare Other | Admitting: Vascular Surgery

## 2013-08-26 ENCOUNTER — Encounter (INDEPENDENT_AMBULATORY_CARE_PROVIDER_SITE_OTHER): Payer: Medicare Other | Admitting: *Deleted

## 2013-08-26 VITALS — BP 157/73 | HR 74 | Resp 16 | Ht 62.0 in | Wt 138.0 lb

## 2013-08-26 DIAGNOSIS — I872 Venous insufficiency (chronic) (peripheral): Secondary | ICD-10-CM

## 2013-08-26 DIAGNOSIS — I824Z9 Acute embolism and thrombosis of unspecified deep veins of unspecified distal lower extremity: Secondary | ICD-10-CM | POA: Insufficient documentation

## 2013-08-26 DIAGNOSIS — I87099 Postthrombotic syndrome with other complications of unspecified lower extremity: Secondary | ICD-10-CM

## 2013-08-26 DIAGNOSIS — I87093 Postthrombotic syndrome with other complications of bilateral lower extremity: Secondary | ICD-10-CM

## 2013-08-26 DIAGNOSIS — I83893 Varicose veins of bilateral lower extremities with other complications: Secondary | ICD-10-CM

## 2013-08-26 NOTE — Progress Notes (Signed)
Subjective:     Patient ID: Elizabeth Burch, female   DOB: January 17, 1926, 79 y.o.   MRN: [id number]  HPI this 79 year old female returns to further discuss her chronic edema and skin changes do to previous DVT. She has had bilateral DVTs in the past dating back about 20 years. She was evaluated by Dr. Trula Slade 6 months ago and was found to have some reflux in the great saphenous veins bilaterally. She also was found to have chronic nonobstructing DVT bilaterally. She has continued to wear a short leg elastic compression stockings on a daily basis and elevate her legs at night and continues to have some thickening of the skin which concerned her. She has no history of stasis ulcers or bleeding.    Past Medical History  Diagnosis Date  . HTN (hypertension)   . Fibrocystic breast disease   . Palpitations   . Glaucoma   . Multiple thyroid nodules   . Stasis dermatitis   . Gout   . DVT (deep venous thrombosis) 1950  . Arthritis     Gout  . Peripheral vascular disease   . Chronic kidney disease, stage IV (severe)   . Diverticulosis of colon (without mention of hemorrhage) 2008    Dr. Patterson(Colonoscopy)  . Hiatal hernia 2004    Dr. Arther Dames)  . GERD (gastroesophageal reflux disease) 2004    Dr. Arther Dames)  . External hemorrhoids without mention of complication [id number]    Dr. Patterson(Colonoscopy)    History  Substance Use Topics  . Smoking status: Never Smoker   . Smokeless tobacco: Never Used  . Alcohol Use: No    Family History  Problem Relation Age of Onset  . Cancer Mother     pt Francene Boyers of the origin  . Breast cancer Sister     x 3  . Heart disease Brother     x 2    Allergies  Allergen Reactions  . Prednisone Hypertension  . Ace Inhibitors Other (See Comments)    unknown  . Allopurinol   . Uloric [Febuxostat]   . Alendronate Sodium Rash  . Ciprofloxacin Rash  . Clarithromycin Rash  . Clindamycin/Lincomycin Rash  . Nitrofurantoin Rash  . Ofloxacin Rash   . Penicillins Rash  . Sulfamethoxazole Rash  . Sulfonamide Derivatives Rash    Current outpatient prescriptions:amLODipine (NORVASC) 5 MG tablet, TAKE ONE TABLET BY MOUTH EVERY DAY, Disp: 30 tablet, Rfl: 11;  calcitRIOL (ROCALTROL) 0.25 MCG capsule, Take 0.25 mcg by mouth daily., Disp: , Rfl: ;  colchicine 0.6 MG tablet, Take 0.6 mg by mouth daily., Disp: , Rfl: ;  dorzolamide (TRUSOPT) 2 % ophthalmic solution, Place 1 drop into both eyes 2 (two) times daily., Disp: , Rfl:  febuxostat (ULORIC) 40 MG tablet, Take 1 tablet (40 mg total) by mouth daily., Disp: 14 tablet, Rfl: 0;  furosemide (LASIX) 40 MG tablet, Take 40 mg by mouth daily. , Disp: , Rfl: ;  latanoprost (XALATAN) 0.005 % ophthalmic solution, Place 1 drop into both eyes at bedtime. , Disp: , Rfl: ;  losartan (COZAAR) 100 MG tablet, TAKE ONE TABLET BY MOUTH EVERY DAY, Disp: 30 tablet, Rfl: 5 metoprolol succinate (TOPROL-XL) 25 MG 24 hr tablet, TAKE 1 TABLET ONCE A DAY, Disp: 30 tablet, Rfl: 12;  Omega-3 Fatty Acids (FISH OIL) 1000 MG CAPS, Take 1,000 mg by mouth daily., Disp: , Rfl: ;  pantoprazole (PROTONIX) 40 MG tablet, Take 40 mg by mouth at bedtime. , Disp: , Rfl: ;  warfarin (COUMADIN)  5 MG tablet, Take as directed, Disp: 30 tablet, Rfl: 1  BP 157/73  Pulse 74  Resp 16  Ht 5\' 2"  (1.575 m)  Wt 138 lb (62.596 kg)  BMI 25.23 kg/m2  Body mass index is 25.23 kg/(m^2).          Review of Systems denies chest pain, dyspnea on exertion, PND, orthopnea, hemoptysis.     Objective:   Physical Exam BP 157/73  Pulse 74  Resp 16  Ht 5\' 2"  (1.575 m)  Wt 138 lb (62.596 kg)  BMI 25.23 kg/m2  General well-developed well-nourished female in no apparent stress alert and oriented x3 Lungs no rhonchi or wheezing Lower extremities with 3+ dorsalis pedis pulses palpable. 1+ edema from knee to ankle. There is some hyperpigmentation bilaterally lower third of both legs medially with no active ulceration. No bulging varicosities are  noted.  Today I ordered a venous duplex exam of the right leg. She had a previous venous duplex exam of the left leg in April of 2014. Both of these reveal evidence of chronic DVT with some reflux in the superficial right and left great saphenous veins.     Assessment:     Chronic DVT bilaterally with skin changes but no active ulceration Mild reflux in bilateral great saphenous veins    Plan:     Do not think laser ablation of the great saphenous system should be performed with findings of chronic DVT bilaterally and borderline caliber saphenous veins with mild reflux Have recommended continued elevation and stockings support on a daily basis

## 2013-09-18 ENCOUNTER — Ambulatory Visit (INDEPENDENT_AMBULATORY_CARE_PROVIDER_SITE_OTHER): Payer: Medicare Other | Admitting: Pharmacist

## 2013-09-18 DIAGNOSIS — I82409 Acute embolism and thrombosis of unspecified deep veins of unspecified lower extremity: Secondary | ICD-10-CM

## 2013-09-18 DIAGNOSIS — I82402 Acute embolism and thrombosis of unspecified deep veins of left lower extremity: Secondary | ICD-10-CM

## 2013-09-18 LAB — POCT INR: INR: 2.6

## 2013-09-18 NOTE — Patient Instructions (Signed)
Anticoagulation Dose Instructions as of 09/18/2013     Elizabeth Burch Tue Wed Thu Fri Sat   New Dose 2.5 mg 5 mg 2.5 mg 2.5 mg 2.5 mg 5 mg 2.5 mg    Description       Continue 2.5mg  daily except 5mg  Mondays and Fridays.      INR was 2.6 today

## 2013-10-13 ENCOUNTER — Other Ambulatory Visit: Payer: Self-pay | Admitting: Family Medicine

## 2013-10-17 ENCOUNTER — Other Ambulatory Visit: Payer: Self-pay | Admitting: Family Medicine

## 2013-10-23 ENCOUNTER — Ambulatory Visit (INDEPENDENT_AMBULATORY_CARE_PROVIDER_SITE_OTHER): Payer: Medicare Other | Admitting: Pharmacist

## 2013-10-23 DIAGNOSIS — I82402 Acute embolism and thrombosis of unspecified deep veins of left lower extremity: Secondary | ICD-10-CM

## 2013-10-23 DIAGNOSIS — I82409 Acute embolism and thrombosis of unspecified deep veins of unspecified lower extremity: Secondary | ICD-10-CM

## 2013-10-23 DIAGNOSIS — Z23 Encounter for immunization: Secondary | ICD-10-CM

## 2013-10-23 NOTE — Patient Instructions (Signed)
Anticoagulation Dose Instructions as of 10/23/2013     Elizabeth Burch Tue Wed Thu Fri Sat   New Dose 2.5 mg 2.5 mg 2.5 mg 2.5 mg 2.5 mg 5 mg 2.5 mg    Description       Hold for 2 days, then start 1/2 tablet daily except 1 tablet on Fridays.       INR was 4.7 today

## 2013-10-27 ENCOUNTER — Other Ambulatory Visit (INDEPENDENT_AMBULATORY_CARE_PROVIDER_SITE_OTHER): Payer: Medicare Other

## 2013-10-27 ENCOUNTER — Ambulatory Visit (INDEPENDENT_AMBULATORY_CARE_PROVIDER_SITE_OTHER): Payer: Medicare Other | Admitting: Pharmacist

## 2013-10-27 DIAGNOSIS — I82409 Acute embolism and thrombosis of unspecified deep veins of unspecified lower extremity: Secondary | ICD-10-CM

## 2013-10-27 DIAGNOSIS — E559 Vitamin D deficiency, unspecified: Secondary | ICD-10-CM

## 2013-10-27 DIAGNOSIS — M109 Gout, unspecified: Secondary | ICD-10-CM

## 2013-10-27 DIAGNOSIS — E785 Hyperlipidemia, unspecified: Secondary | ICD-10-CM

## 2013-10-27 DIAGNOSIS — I10 Essential (primary) hypertension: Secondary | ICD-10-CM

## 2013-10-27 DIAGNOSIS — N184 Chronic kidney disease, stage 4 (severe): Secondary | ICD-10-CM

## 2013-10-27 LAB — POCT CBC
Hemoglobin: 11.4 g/dL — AB (ref 12.2–16.2)
Lymph, poc: 2.3 (ref 0.6–3.4)
MCH, POC: 29.2 pg (ref 27–31.2)
MPV: 7.5 fL (ref 0–99.8)
POC Granulocyte: 5.5 (ref 2–6.9)
POC LYMPH PERCENT: 27.8 %L (ref 10–50)
Platelet Count, POC: 236 10*3/uL (ref 142–424)
RBC: 3.9 M/uL — AB (ref 4.04–5.48)
RDW, POC: 13.9 %
WBC: 8.3 10*3/uL (ref 4.6–10.2)

## 2013-10-27 LAB — BASIC METABOLIC PANEL WITH GFR
BUN: 47 mg/dL — ABNORMAL HIGH (ref 6–23)
CO2: 27 mEq/L (ref 19–32)
Calcium: 10.3 mg/dL (ref 8.4–10.5)
Creat: 2.05 mg/dL — ABNORMAL HIGH (ref 0.50–1.10)
GFR, Est African American: 25 mL/min — ABNORMAL LOW
GFR, Est Non African American: 22 mL/min — ABNORMAL LOW
Sodium: 143 mEq/L (ref 135–145)

## 2013-10-27 LAB — HEPATIC FUNCTION PANEL
Albumin: 4.2 g/dL (ref 3.5–5.2)
Alkaline Phosphatase: 54 U/L (ref 39–117)
Indirect Bilirubin: 0.5 mg/dL (ref 0.0–0.9)
Total Bilirubin: 0.6 mg/dL (ref 0.3–1.2)
Total Protein: 6.4 g/dL (ref 6.0–8.3)

## 2013-10-27 LAB — POCT INR: INR: 2.3

## 2013-10-27 NOTE — Progress Notes (Signed)
Pt came in for labs only 

## 2013-10-28 LAB — NMR LIPOPROFILE WITH LIPIDS
Cholesterol, Total: 220 mg/dL — ABNORMAL HIGH (ref <200)
HDL-C: 97 mg/dL (ref >=40)
LDL (calc): 106 mg/dL — ABNORMAL HIGH (ref <100)
LDL Particle Number: 1234 nmol/L — ABNORMAL HIGH (ref <1000)
LDL Size: 21.1 nm (ref >20.5)
LP-IR Score: <25 (ref <=45)
Large HDL-P: 17.8 umol/L (ref >=4.8)
Small LDL Particle Number: 298 nmol/L (ref <=527)
VLDL Size: 43.6 nm (ref <=46.6)

## 2013-11-03 ENCOUNTER — Ambulatory Visit (INDEPENDENT_AMBULATORY_CARE_PROVIDER_SITE_OTHER): Payer: Medicare Other | Admitting: Family Medicine

## 2013-11-03 ENCOUNTER — Ambulatory Visit (INDEPENDENT_AMBULATORY_CARE_PROVIDER_SITE_OTHER): Payer: Medicare Other | Admitting: Pharmacist

## 2013-11-03 ENCOUNTER — Encounter: Payer: Self-pay | Admitting: Family Medicine

## 2013-11-03 VITALS — BP 163/62 | HR 54 | Temp 98.7°F | Ht 62.0 in | Wt 140.0 lb

## 2013-11-03 DIAGNOSIS — I10 Essential (primary) hypertension: Secondary | ICD-10-CM

## 2013-11-03 DIAGNOSIS — M40209 Unspecified kyphosis, site unspecified: Secondary | ICD-10-CM | POA: Insufficient documentation

## 2013-11-03 DIAGNOSIS — M4000 Postural kyphosis, site unspecified: Secondary | ICD-10-CM

## 2013-11-03 DIAGNOSIS — E785 Hyperlipidemia, unspecified: Secondary | ICD-10-CM

## 2013-11-03 DIAGNOSIS — I82409 Acute embolism and thrombosis of unspecified deep veins of unspecified lower extremity: Secondary | ICD-10-CM

## 2013-11-03 DIAGNOSIS — N189 Chronic kidney disease, unspecified: Secondary | ICD-10-CM

## 2013-11-03 DIAGNOSIS — Z780 Asymptomatic menopausal state: Secondary | ICD-10-CM

## 2013-11-03 LAB — POCT INR: INR: 2.7

## 2013-11-03 NOTE — Patient Instructions (Signed)
Anticoagulation Dose Instructions as of 11/03/2013     [name] Tue Wed Thu Fri Sat   New Dose 2.5 mg 2.5 mg 2.5 mg 2.5 mg 2.5 mg 5 mg 2.5 mg    Description       Continue 1/2 tablet daily except 1 tablet on Fridays.       INR was 2.7 today

## 2013-11-03 NOTE — Patient Instructions (Signed)
Continue current medications. Continue good therapeutic lifestyle changes which include good diet and exercise. Fall precautions discussed with patient. If you are over 77 years old - you may need Prevnar 76 or the adult Pneumonia vaccine.

## 2013-11-03 NOTE — Progress Notes (Addendum)
Subjective:    Patient ID: Elizabeth Burch, female    DOB: 1926-04-14, 79 y.o.   MRN: [id number]  HPI Pt here for follow up and management of chronic medical problems. Patient is followed regularly by this practice, the cardiologist, the nephrologist, and a rheumatologist. Patient notes today that her right shoulder is lower than her left shoulder and that she has issues with keeping her bra strap a. She also has kyphosis in her back. She says that the shoulder has been lower especially since the fall down the steps to 3 years ago. She does describe no pain associated with this. It is important to note that the patient brings her home blood pressure readings in and they have been running he will from the 120s over the 50-60 range consistently.     Patient Active Problem List   Diagnosis Date Noted  . Acute venous embolism and thrombosis of deep vessels of distal lower extremity 08/26/2013  . 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 Fibrocystic disease of right breast 05/22/2013  . DVT (deep venous thrombosis) 02/16/2013  . Leg edema, left 09/16/2012  . Gout   . CKD (chronic kidney disease) 01/08/2012  . Multinodular thyroid 10/25/2011  . Fibrocystic breast disease, left.   . Glaucoma   . HYPERLIPIDEMIA 01/06/2009  . HYPERTENSION 01/06/2009   Outpatient Encounter Prescriptions as of 11/03/2013  Medication Sig  . amLODipine (NORVASC) 5 MG tablet TAKE ONE TABLET BY MOUTH EVERY DAY  . calcitRIOL (ROCALTROL) 0.25 MCG capsule Take 0.25 mcg by mouth daily.  . dorzolamide-timolol (COSOPT) 22.3-6.8 MG/ML ophthalmic solution   . febuxostat (ULORIC) 40 MG tablet Take 1 tablet (40 mg total) by mouth daily.  . furosemide (LASIX) 40 MG tablet Take 40 mg by mouth daily.   Marland Kitchen latanoprost (XALATAN) 0.005 % ophthalmic solution Place 1 drop into both eyes at bedtime.   Marland Kitchen losartan (COZAAR) 100 MG tablet TAKE ONE TABLET BY MOUTH EVERY DAY  . metoprolol succinate (TOPROL-XL) 25 MG 24 hr  tablet TAKE 1 TABLET ONCE A DAY  . Omega-3 Fatty Acids (FISH OIL) 1000 MG CAPS Take 1,000 mg by mouth daily.  . pantoprazole (PROTONIX) 40 MG tablet TAKE ONE TABLET BY MOUTH ONCE DAILY  . warfarin (COUMADIN) 5 MG tablet TAKE AS DIRECTED  . [DISCONTINUED] dorzolamide (TRUSOPT) 2 % ophthalmic solution Place 1 drop into both eyes 2 (two) times daily.  . [DISCONTINUED] colchicine 0.6 MG tablet Take 0.6 mg by mouth daily.    Review of Systems  Constitutional: Negative.   HENT: Negative.   Eyes: Negative.   Respiratory: Negative.   Cardiovascular: Negative.   Gastrointestinal: Negative.   Endocrine: Negative.   Genitourinary: Negative.   Musculoskeletal: Negative.   Skin: Negative.   Allergic/Immunologic: Negative.   Neurological: Negative.   Hematological: Negative.   Psychiatric/Behavioral: Negative.        Objective:   Physical Exam  Nursing note and vitals reviewed. Constitutional: She is oriented to person, place, and time. She appears well-developed and well-nourished. No distress.  Pleasant cooperative patient with kyphosis  HENT:  Head: Normocephalic and atraumatic.  Right Ear: External ear normal.  Left Ear: External ear normal.  Nose: Nose normal.  Mouth/Throat: Oropharynx is clear and moist. No oropharyngeal exudate.  Eyes: Conjunctivae and EOM are normal. Pupils are equal, round, and reactive to light. Right eye exhibits no discharge. Left eye exhibits no discharge. No scleral icterus.  Neck: Normal range of motion. Neck supple. No JVD present.  No thyromegaly present.  Cardiovascular: Normal rate, regular rhythm, normal heart sounds and intact distal pulses.  Exam reveals no gallop and no friction rub.   No murmur heard. At 72 per minute  Pulmonary/Chest: Effort normal and breath sounds normal. No respiratory distress. She has no wheezes. She has no rales. She exhibits no tenderness.  Abdominal: Soft. Bowel sounds are normal. She exhibits no mass. There is no  tenderness. There is no rebound and no guarding.  Musculoskeletal: Normal range of motion. She exhibits no edema and no tenderness.  Right shoulder is held lower than the left shoulder and there is obvious thoracic kyphosis  Lymphadenopathy:    She has no cervical adenopathy.  Neurological: She is alert and oriented to person, place, and time. She has normal reflexes. No cranial nerve deficit.  Skin: Skin is warm and dry. No rash noted.  Psychiatric: She has a normal mood and affect. Her behavior is normal. Judgment and thought content normal.    BP 163/62  Pulse 54  Temp(Src) 98.7 F (37.1 C) (Oral)  Ht 5\' 2"  (1.575 m)  Wt 140 lb (63.504 kg)  BMI 25.60 kg/m2       Assessment & Plan:  1. HYPERLIPIDEMIA  2. HYPERTENSION  3. CKD (chronic kidney disease)  4. Postmenopausal - DG Bone Density; Future  5. Kyphosis  Orders Placed This Encounter  Procedures  . DG Bone Density    Standing Status: Future     Number of Occurrences:      Standing Expiration Date: 01/04/2015    Order Specific Question:  Reason for Exam (SYMPTOM  OR DIAGNOSIS REQUIRED)    Answer:  postmenopausal    Order Specific Question:  Preferred imaging location?    Answer:  Internal   Meds ordered this encounter  Medications  . dorzolamide-timolol (COSOPT) 22.3-6.8 MG/ML ophthalmic solution    Sig:    Patient Instructions  Continue current medications. Continue good therapeutic lifestyle changes which include good diet and exercise. Fall precautions discussed with patient. If you are over 72 years old - you may need Prevnar 33 or the adult Pneumonia vaccine.    Arrie Senate MD

## 2013-11-14 ENCOUNTER — Other Ambulatory Visit: Payer: Self-pay | Admitting: Cardiology

## 2013-11-17 ENCOUNTER — Ambulatory Visit (INDEPENDENT_AMBULATORY_CARE_PROVIDER_SITE_OTHER): Payer: Medicare Other | Admitting: Family Medicine

## 2013-11-17 ENCOUNTER — Encounter: Payer: Self-pay | Admitting: Family Medicine

## 2013-11-17 ENCOUNTER — Ambulatory Visit (INDEPENDENT_AMBULATORY_CARE_PROVIDER_SITE_OTHER): Payer: Medicare Other | Admitting: Pharmacist

## 2013-11-17 VITALS — BP 143/62 | HR 71 | Temp 97.5°F | Ht 62.0 in | Wt 142.0 lb

## 2013-11-17 DIAGNOSIS — B349 Viral infection, unspecified: Secondary | ICD-10-CM

## 2013-11-17 DIAGNOSIS — R05 Cough: Secondary | ICD-10-CM

## 2013-11-17 DIAGNOSIS — I82409 Acute embolism and thrombosis of unspecified deep veins of unspecified lower extremity: Secondary | ICD-10-CM

## 2013-11-17 DIAGNOSIS — R059 Cough, unspecified: Secondary | ICD-10-CM

## 2013-11-17 DIAGNOSIS — B9789 Other viral agents as the cause of diseases classified elsewhere: Secondary | ICD-10-CM

## 2013-11-17 LAB — POCT CBC
Granulocyte percent: 66.1 %G (ref 37–80)
HCT, POC: 34.4 % — AB (ref 37.7–47.9)
Lymph, poc: 1.9 (ref 0.6–3.4)
MCH, POC: 30.6 pg (ref 27–31.2)
MCHC: 33.3 g/dL (ref 31.8–35.4)
MCV: 91.9 fL (ref 80–97)
POC Granulocyte: 5.2 (ref 2–6.9)
Platelet Count, POC: 297 10*3/uL (ref 142–424)
RDW, POC: 13.4 %
WBC: 7.8 10*3/uL (ref 4.6–10.2)

## 2013-11-17 LAB — POCT INR: INR: 5.5

## 2013-11-17 MED ORDER — HYDROCOD POLST-CHLORPHEN POLST 10-8 MG/5ML PO LQCR: ORAL | Status: DC

## 2013-11-17 NOTE — Patient Instructions (Signed)
Hold warfarin until come in for recheck on Wednesday, December 17th  INR was 5.5 today

## 2013-11-17 NOTE — Progress Notes (Signed)
Triaged to Dr Laurance Flatten for sore throat and cough

## 2013-11-17 NOTE — Patient Instructions (Signed)
Must drink plenty of fluids Take Tylenol for aches pains or fever Continue Mucinex maximum strength, blue and white in color, over-the-counter, one twice daily with a large glass of water Use saline nose spray in her nose frequently during the day Take medication for cough as needed for severe cough Use a cool mist humidifier in her bedroom at nighttime and keep the house cooler

## 2013-11-17 NOTE — Progress Notes (Signed)
Subjective:    Patient ID: Elizabeth Burch, female    DOB: 1926/09/24, 79 y.o.   MRN: [id number]  HPI Patient here today for cold, cough, and congestion for 5 days.     Patient Active Problem List   Diagnosis Date Noted  . Kyphosis 11/03/2013  . Acute venous embolism and thrombosis of deep vessels of distal lower extremity 08/26/2013  . 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 Fibrocystic disease of right breast 05/22/2013  . DVT (deep venous thrombosis) 02/16/2013  . Leg edema, left 09/16/2012  . Gout   . CKD (chronic kidney disease) 01/08/2012  . Multinodular thyroid 10/25/2011  . Fibrocystic breast disease, left.   . Open-angle glaucoma   . HYPERLIPIDEMIA 01/06/2009  . HYPERTENSION 01/06/2009   Outpatient Encounter Prescriptions as of 11/17/2013  Medication Sig  . amLODipine (NORVASC) 5 MG tablet TAKE ONE TABLET BY MOUTH EVERY DAY  . calcitRIOL (ROCALTROL) 0.25 MCG capsule Take 0.25 mcg by mouth daily.  Marland Kitchen COLCRYS 0.6 MG tablet Take 1 tablet by mouth daily.  . dorzolamide-timolol (COSOPT) 22.3-6.8 MG/ML ophthalmic solution   . febuxostat (ULORIC) 40 MG tablet Take 1 tablet (40 mg total) by mouth daily.  . furosemide (LASIX) 40 MG tablet Take 40 mg by mouth daily.   Marland Kitchen latanoprost (XALATAN) 0.005 % ophthalmic solution Place 1 drop into both eyes at bedtime.   Marland Kitchen losartan (COZAAR) 100 MG tablet TAKE ONE TABLET BY MOUTH EVERY DAY  . metoprolol succinate (TOPROL-XL) 25 MG 24 hr tablet TAKE ONE TABLET BY MOUTH EVERY DAY  . Omega-3 Fatty Acids (FISH OIL) 1000 MG CAPS Take 1,000 mg by mouth daily.  . pantoprazole (PROTONIX) 40 MG tablet TAKE ONE TABLET BY MOUTH ONCE DAILY  . warfarin (COUMADIN) 5 MG tablet TAKE AS DIRECTED    Review of Systems  Constitutional: Negative.   HENT: Positive for congestion.   Eyes: Negative.   Respiratory: Positive for cough.   Cardiovascular: Negative.   Gastrointestinal: Negative.   Endocrine: Negative.   Genitourinary:  Negative.   Musculoskeletal: Negative.   Skin: Negative.   Allergic/Immunologic: Negative.   Neurological: Negative.   Hematological: Negative.   Psychiatric/Behavioral: Negative.        Objective:   Physical Exam  Nursing note and vitals reviewed. Constitutional: She is oriented to person, place, and time. She appears well-developed and well-nourished. No distress.  HENT:  Head: Normocephalic and atraumatic.  Right Ear: External ear normal.  Left Ear: External ear normal.  Mouth/Throat: No oropharyngeal exudate.  Throat was slightly red posteriorly and there was nasal congestion that was pretty thick bilaterally but the nasal passages were open  Eyes: Conjunctivae and EOM are normal. Pupils are equal, round, and reactive to light. Right eye exhibits no discharge. Left eye exhibits no discharge. No scleral icterus.  Neck: Normal range of motion. Neck supple. No thyromegaly present.  Cardiovascular: Normal rate, regular rhythm, normal heart sounds and intact distal pulses.  Exam reveals no gallop.   No murmur heard. Pulmonary/Chest: Effort normal and breath sounds normal. No respiratory distress. She has no wheezes. She has no rales. She exhibits no tenderness.  Dry cough without wheezes or rales or increased congestion with coughing  Musculoskeletal: Normal range of motion. She exhibits no edema.  Neurological: She is alert and oriented to person, place, and time.  Skin: Skin is warm and dry. No rash noted.  Psychiatric: She has a normal mood and affect. Her behavior is normal. Judgment and thought  content normal.   BP 143/62  Pulse 71  Temp(Src) 97.5 F (36.4 C) (Oral)  Ht 5\' 2"  (1.575 m)  Wt 142 lb (64.411 kg)  BMI 25.97 kg/m2  Results for orders placed in visit on 11/17/13  POCT INR      Result Value Range   INR 5.5    POCT CBC      Result Value Range   WBC 7.8  4.6 - 10.2 K/uL   Lymph, poc 1.9  0.6 - 3.4   POC LYMPH PERCENT 24.6  10 - 50 %L   POC Granulocyte 5.2  2  - 6.9   Granulocyte percent 66.1  37 - 80 %G   RBC 3.7 (*) 4.04 - 5.48 M/uL   Hemoglobin 11.5 (*) 12.2 - 16.2 g/dL   HCT, POC 34.4 (*) 37.7 - 47.9 %   MCV 91.9  80 - 97 fL   MCH, POC 30.6  27 - 31.2 pg   MCHC 33.3  31.8 - 35.4 g/dL   RDW, POC 13.4     Platelet Count, POC 297.0  142 - 424 K/uL   MPV 7.0  0 - 99.8 fL         Assessment & Plan:  1. Viral syndrome - chlorpheniramine-HYDROcodone (TUSSIONEX PENNKINETIC ER) 10-8 MG/5ML LQCR; 1 teaspoon at bedtime as needed for severe cough  Dispense: 120 mL; Refill: 0  2. Cough - chlorpheniramine-HYDROcodone (TUSSIONEX PENNKINETIC ER) 10-8 MG/5ML LQCR; 1 teaspoon at bedtime as needed for severe cough  Dispense: 120 mL; Refill: 0  Patient Instructions  Must drink plenty of fluids Take Tylenol for aches pains or fever Continue Mucinex maximum strength, blue and white in color, over-the-counter, one twice daily with a large glass of water Use saline nose spray in her nose frequently during the day Take medication for cough as needed for severe cough Use a cool mist humidifier in her bedroom at nighttime and keep the house cooler   Arrie Senate MD

## 2013-11-19 ENCOUNTER — Telehealth: Payer: Self-pay | Admitting: Pharmacist

## 2013-11-19 ENCOUNTER — Ambulatory Visit: Payer: Self-pay

## 2013-11-19 NOTE — Telephone Encounter (Signed)
Patient called.  Although I do not think her leg pain is related to the cough syrup (Tussionex) given Monday, she reports that cough is better so I advised not to take any more.  She is given appt for tomorrow to recheck protime and told to continue to hold warfarin today.  Offered appt today to have legs check - pt declined.

## 2013-11-24 ENCOUNTER — Encounter: Payer: Self-pay | Admitting: General Practice

## 2013-11-24 ENCOUNTER — Ambulatory Visit: Payer: Medicare Other | Admitting: Pharmacist

## 2013-11-24 ENCOUNTER — Ambulatory Visit (INDEPENDENT_AMBULATORY_CARE_PROVIDER_SITE_OTHER): Payer: Medicare Other | Admitting: General Practice

## 2013-11-24 ENCOUNTER — Telehealth: Payer: Self-pay | Admitting: Pharmacist

## 2013-11-24 VITALS — BP 152/59 | HR 52 | Temp 97.8°F | Ht 62.0 in | Wt 139.5 lb

## 2013-11-24 DIAGNOSIS — R609 Edema, unspecified: Secondary | ICD-10-CM

## 2013-11-24 DIAGNOSIS — I82409 Acute embolism and thrombosis of unspecified deep veins of unspecified lower extremity: Secondary | ICD-10-CM

## 2013-11-24 DIAGNOSIS — R600 Localized edema: Secondary | ICD-10-CM

## 2013-11-24 LAB — POCT INR: INR: 1.7

## 2013-11-24 MED ORDER — WARFARIN SODIUM 2 MG PO TABS: ORAL_TABLET | ORAL | Status: DC

## 2013-11-24 NOTE — Telephone Encounter (Signed)
Appt already given for today

## 2013-11-24 NOTE — Telephone Encounter (Signed)
Patient needs to be seen regarding difficulty walking - we will also check INR but priority is her difficulty walking.  Will send request to triage.

## 2013-11-24 NOTE — Progress Notes (Signed)
No charge for clinical pharmacist visit - patient seeing Aleen Sells

## 2013-11-24 NOTE — Patient Instructions (Signed)
Anticoagulation Dose Instructions as of 11/24/2013     [name] Tue Wed Thu Fri Sat   New Dose 2 mg 4 mg 2 mg 2 mg 2 mg 2 mg 2 mg    Description       Start new dose of 2mg  daily except 4mg  (= 2 tablets) on mondays       INR was 1.7 today

## 2013-12-03 ENCOUNTER — Ambulatory Visit (INDEPENDENT_AMBULATORY_CARE_PROVIDER_SITE_OTHER): Payer: Medicare Other | Admitting: Pharmacist

## 2013-12-03 DIAGNOSIS — I82409 Acute embolism and thrombosis of unspecified deep veins of unspecified lower extremity: Secondary | ICD-10-CM

## 2013-12-03 LAB — POCT INR: INR: 2.3

## 2013-12-03 NOTE — Progress Notes (Signed)
   Subjective:    Patient ID: Elizabeth Burch, female    DOB: 04-12-1926, 79 y.o.   MRN: [id number]  HPI Patient presents today with complaints of feet swelling. Onset of swelling was three days ago and gradually improved. Reports swelling has greatly improved and able to wear shoes comfortably now. Verbalized she wanted someone to look at feet since getting INR drawn today.     Review of Systems  Constitutional: Negative for fever and chills.  Respiratory: Negative for cough, chest tightness, shortness of breath and wheezing.   Cardiovascular: Negative for chest pain, palpitations and leg swelling.       Objective:   Physical Exam  Constitutional: She is oriented to person, place, and time. She appears well-developed and well-nourished.  Cardiovascular: Normal heart sounds.  Bradycardia present.   Pulses:      Dorsalis pedis pulses are 1+ on the right side, and 1+ on the left side.  1+ non pitting edema, bilateral feet. Negative discoloration  Pulmonary/Chest: Effort normal and breath sounds normal. No respiratory distress. She exhibits no tenderness.  Neurological: She is alert and oriented to person, place, and time.  Skin: Skin is warm and dry.  Psychiatric: She has a normal mood and affect.          Assessment & Plan:  1. DVT (deep venous thrombosis), unspecified laterality  - POCT INR  2. Pedal edema -elevate feet during the day while sitting -discussed low sodium diet -RTO if symptoms worsen or unresolved -may seek emergency medical treatment -Patient verbalized understanding Erby Pian, FNP-C

## 2013-12-03 NOTE — Patient Instructions (Signed)
Anticoagulation Dose Instructions as of 12/03/2013     Elizabeth Burch Tue Wed Thu Fri Sat   New Dose 2 mg 4 mg 2 mg 2 mg 2 mg 2 mg 2 mg    Description       Continue dose of 2mg  daily except 4mg  (= 2 tablets) on mondays       INR was 2.3 today

## 2013-12-11 ENCOUNTER — Ambulatory Visit (INDEPENDENT_AMBULATORY_CARE_PROVIDER_SITE_OTHER): Payer: Medicare Other | Admitting: Pharmacist

## 2013-12-11 DIAGNOSIS — I82409 Acute embolism and thrombosis of unspecified deep veins of unspecified lower extremity: Secondary | ICD-10-CM

## 2013-12-11 LAB — POCT INR: INR: 2.1

## 2013-12-11 NOTE — Patient Instructions (Signed)
Anticoagulation Dose Instructions as of 12/11/2013     Elizabeth Burch Tue Wed Thu Fri Sat   New Dose 2 mg 4 mg 2 mg 2 mg 2 mg 2 mg 2 mg    Description       Continue dose of 2mg  daily except 4mg  (= 2 tablets) on mondays       INR was 2.1 today

## 2013-12-24 ENCOUNTER — Ambulatory Visit (INDEPENDENT_AMBULATORY_CARE_PROVIDER_SITE_OTHER): Payer: Medicare Other

## 2013-12-24 ENCOUNTER — Encounter: Payer: Self-pay | Admitting: Pharmacist

## 2013-12-24 ENCOUNTER — Ambulatory Visit (INDEPENDENT_AMBULATORY_CARE_PROVIDER_SITE_OTHER): Payer: Medicare Other | Admitting: Pharmacist

## 2013-12-24 VITALS — Ht 63.0 in | Wt 140.0 lb

## 2013-12-24 DIAGNOSIS — I82409 Acute embolism and thrombosis of unspecified deep veins of unspecified lower extremity: Secondary | ICD-10-CM

## 2013-12-24 DIAGNOSIS — M810 Age-related osteoporosis without current pathological fracture: Secondary | ICD-10-CM | POA: Insufficient documentation

## 2013-12-24 DIAGNOSIS — Z780 Asymptomatic menopausal state: Secondary | ICD-10-CM

## 2013-12-24 LAB — POCT INR: INR: 2

## 2013-12-24 NOTE — Progress Notes (Signed)
Patient ID: Elizabeth Burch, female   DOB: May 12, 1926, 80 y.o.   MRN: [id number]  [facility] Current Height: Height: 5\' 3"  (160 cm)      Max Lifetime Height:  5\' 3"   Current Weight: Weight: 140 lb (63.504 kg)       Ethnicity:Caucasian    HPI: Does pt already have a diagnosis of:  Osteopenia?  No Osteoporosis?  Yes  Back Pain?  Yes       Kyphosis?  No Prior fracture?  Yes - fractured spine about 3 years ago Med(s) for Osteoporosis/Osteopenia:  none Med(s) previously tried for Osteoporosis/Osteopenia:  Fosamax - patient cannot remember why stopped but knows she had an adverse reaction.                                                             PMH: Age at menopause:  About 80 yo Hysterectomy?  No Oophorectomy?  No HRT? No Steroid Use?  No Thyroid med?  No History of cancer?  No History of digestive disorders (ie Crohn's)?  Yes - GERD on PPI daily Current or previous eating disorders?  No Last Vitamin D Result:  40 (10/27/2013) Last GFR Result:  22 (10/27/2013)   FH/SH: Family history of osteoporosis?  Yes - sister Parent with history of hip fracture?  No Family history of breast cancer?  Yes - sisters X 3 Exercise?  Yes - walking Smoking?  No Alcohol?  No    Calcium Assessment Calcium Intake  # of servings/day  Calcium mg  Milk (8 oz) 1  x  300  = 300mg   Yogurt (4 oz) 0 x  200 = 0  Cheese (1 oz) 1 x  200 = 200mg   Other Calcium sources   250mg   Ca supplement 0 = 0   Estimated calcium intake per day 750mg     DEXA Results Date of Test T-Score for AP Spine L1-L4 T-Score for Total Left Hip T-Score for Total Right Hip  12/24/2013 -2.1 -2.4 -2.4  08/31/2003 -2.2 -2.1 --  03/31/1998 -2.1 -2.2 --        INR was 2.0 today  Assessment: 1.Osteoporosis with decrease in BMD since last checked in 2004.  Due to low creatinine clearance / GFR bisphosphonates are contraindicated and hypocalcemia might be more likely with prolia use.  Evista is contraindicated due to  possible increase thromboembolism and she has a history of DVT.  2.  Therapeutic anticogulation Recommendations: 1.  Looking into coverage of Forteo for osteoporosis 2.  recommend calcium 1200mg  daily through supplementation or diet.  3.  continue weight bearing exercise - 30 minutes at least 4 days  per week.   4.  Counseled and educated about fall risk and prevention. 5.   Anticoagulation Dose Instructions as of 12/24/2013     [name] Tue Wed Thu Fri Sat   New Dose 2 mg 4 mg 2 mg 2 mg 2 mg 2 mg 2 mg    Description       Continue dose of 2mg  daily except 4mg  (= 2 tablets) on mondays      Recheck INR in 1 months  Recheck DEXA:  2 years  Time spent counseling patient:  30 minutes  Cherre Robins, PharmD, CPP

## 2013-12-24 NOTE — Patient Instructions (Signed)
Anticoagulation Dose Instructions as of 12/24/2013     Elizabeth Burch Tue Wed Thu Fri Sat   New Dose 2 mg 4 mg 2 mg 2 mg 2 mg 2 mg 2 mg    Description       Continue dose of 2mg  daily except 4mg  (= 2 tablets) on mondays       INR was 2.0 today     Fall Prevention and Home Safety Falls cause injuries and can affect all age groups. It is possible to use preventive measures to significantly decrease the likelihood of falls. There are many simple measures which can make your home safer and prevent falls. OUTDOORS  Repair cracks and edges of walkways and driveways.  Remove high doorway thresholds.  Trim shrubbery on the main path into your home.  Have good outside lighting.  Clear walkways of tools, rocks, debris, and clutter.  Check that handrails are not broken and are securely fastened. Both sides of steps should have handrails.  Have leaves, snow, and ice cleared regularly.  Use sand or salt on walkways during winter months.  In the garage, clean up grease or oil spills. BATHROOM  Install night lights.  Install grab bars by the toilet and in the tub and shower.  Use non-skid mats or decals in the tub or shower.  Place a plastic non-slip stool in the shower to sit on, if needed.  Keep floors dry and clean up all water on the floor immediately.  Remove soap buildup in the tub or shower on a regular basis.  Secure bath mats with non-slip, double-sided rug tape.  Remove throw rugs and tripping hazards from the floors. BEDROOMS  Install night lights.  Make sure a bedside light is easy to reach.  Do not use oversized bedding.  Keep a telephone by your bedside.  Have a firm chair with side arms to use for getting dressed.  Remove throw rugs and tripping hazards from the floor. KITCHEN  Keep handles on pots and pans turned toward the center of the stove. Use back burners when possible.  Clean up spills quickly and allow time for drying.  Avoid walking on wet  floors.  Avoid hot utensils and knives.  Position shelves so they are not too high or low.  Place commonly used objects within easy reach.  If necessary, use a sturdy step stool with a grab bar when reaching.  Keep electrical cables out of the way.  Do not use floor polish or wax that makes floors slippery. If you must use wax, use non-skid floor wax.  Remove throw rugs and tripping hazards from the floor. STAIRWAYS  Never leave objects on stairs.  Place handrails on both sides of stairways and use them. Fix any loose handrails. Make sure handrails on both sides of the stairways are as long as the stairs.  Check carpeting to make sure it is firmly attached along stairs. Make repairs to worn or loose carpet promptly.  Avoid placing throw rugs at the top or bottom of stairways, or properly secure the rug with carpet tape to prevent slippage. Get rid of throw rugs, if possible.  Have an electrician put in a light switch at the top and bottom of the stairs. OTHER FALL PREVENTION TIPS  Wear low-heel or rubber-soled shoes that are supportive and fit well. Wear closed toe shoes.  When using a stepladder, make sure it is fully opened and both spreaders are firmly locked. Do not climb a closed stepladder.  Add color or contrast paint or tape to grab bars and handrails in your home. Place contrasting color strips on first and last steps.  Learn and use mobility aids as needed. Install an electrical emergency response system.  Turn on lights to avoid dark areas. Replace light bulbs that burn out immediately. Get light switches that glow.  Arrange furniture to create clear pathways. Keep furniture in the same place.  Firmly attach carpet with non-skid or double-sided tape.  Eliminate uneven floor surfaces.  Select a carpet pattern that does not visually hide the edge of steps.  Be aware of all pets. OTHER HOME SAFETY TIPS  Set the water temperature for 120 F (48.8 C).  Keep  emergency numbers on or near the telephone.  Keep smoke detectors on every level of the home and near sleeping areas. Document Released: 11/10/2002 Document Revised: 05/21/2012 Document Reviewed: 02/09/2012 [facility] Patient Information 2014 [place].

## 2014-01-19 ENCOUNTER — Other Ambulatory Visit: Payer: Self-pay | Admitting: Family Medicine

## 2014-01-19 ENCOUNTER — Other Ambulatory Visit: Payer: Self-pay | Admitting: *Deleted

## 2014-01-19 DIAGNOSIS — N189 Chronic kidney disease, unspecified: Secondary | ICD-10-CM

## 2014-01-26 ENCOUNTER — Ambulatory Visit (INDEPENDENT_AMBULATORY_CARE_PROVIDER_SITE_OTHER): Payer: Medicare Other | Admitting: Pharmacist

## 2014-01-26 ENCOUNTER — Other Ambulatory Visit: Payer: Medicare Other

## 2014-01-26 DIAGNOSIS — N189 Chronic kidney disease, unspecified: Secondary | ICD-10-CM

## 2014-01-26 DIAGNOSIS — I82409 Acute embolism and thrombosis of unspecified deep veins of unspecified lower extremity: Secondary | ICD-10-CM

## 2014-01-26 DIAGNOSIS — I824Z9 Acute embolism and thrombosis of unspecified deep veins of unspecified distal lower extremity: Secondary | ICD-10-CM

## 2014-01-26 LAB — POCT INR: INR: 1.9

## 2014-01-26 NOTE — Patient Instructions (Signed)
Anticoagulation Dose Instructions as of 01/26/2014     Elizabeth Burch Tue Wed Thu Fri Sat   New Dose 2 mg 4 mg 2 mg 2 mg 2 mg 2 mg 2 mg    Description       Take 2 tablets today as planned and also tomorrow, then continue dose of 2mg  daily except 4mg  (= 2 tablets) on mondays       INR was 1.9 today

## 2014-01-27 LAB — BMP8+EGFR
BUN / CREAT RATIO: 27 — AB (ref 11–26)
BUN: 48 mg/dL — ABNORMAL HIGH (ref 8–27)
CO2: 21 mmol/L (ref 18–29)
Calcium: 10.5 mg/dL — ABNORMAL HIGH (ref 8.7–10.3)
Chloride: 105 mmol/L (ref 97–108)
Creatinine, Ser: 1.8 mg/dL — ABNORMAL HIGH (ref 0.57–1.00)
GFR calc Af Amer: 29 mL/min/{1.73_m2} — ABNORMAL LOW (ref >59)
GFR calc non Af Amer: 25 mL/min/{1.73_m2} — ABNORMAL LOW (ref >59)
Glucose: 96 mg/dL (ref 65–99)
Potassium: 4.5 mmol/L (ref 3.5–5.2)
Sodium: 143 mmol/L (ref 134–144)

## 2014-01-28 ENCOUNTER — Other Ambulatory Visit: Payer: Self-pay | Admitting: Family Medicine

## 2014-02-04 ENCOUNTER — Other Ambulatory Visit (INDEPENDENT_AMBULATORY_CARE_PROVIDER_SITE_OTHER): Payer: Medicare Other

## 2014-02-04 DIAGNOSIS — I82409 Acute embolism and thrombosis of unspecified deep veins of unspecified lower extremity: Secondary | ICD-10-CM

## 2014-02-04 DIAGNOSIS — E559 Vitamin D deficiency, unspecified: Secondary | ICD-10-CM

## 2014-02-04 DIAGNOSIS — N184 Chronic kidney disease, stage 4 (severe): Secondary | ICD-10-CM

## 2014-02-04 DIAGNOSIS — I10 Essential (primary) hypertension: Secondary | ICD-10-CM

## 2014-02-04 DIAGNOSIS — M109 Gout, unspecified: Secondary | ICD-10-CM

## 2014-02-04 DIAGNOSIS — E785 Hyperlipidemia, unspecified: Secondary | ICD-10-CM

## 2014-02-04 LAB — BASIC METABOLIC PANEL WITH GFR
BUN: 41 mg/dL — AB (ref 6–23)
CALCIUM: 9.7 mg/dL (ref 8.4–10.5)
CO2: 26 mEq/L (ref 19–32)
Chloride: 108 mEq/L (ref 96–112)
Creat: 2.05 mg/dL — ABNORMAL HIGH (ref 0.50–1.10)
GFR, EST AFRICAN AMERICAN: 25 mL/min — AB
GFR, Est Non African American: 22 mL/min — ABNORMAL LOW
Glucose, Bld: 78 mg/dL (ref 70–99)
Potassium: 4.4 mEq/L (ref 3.5–5.3)
Sodium: 145 mEq/L (ref 135–145)

## 2014-02-04 LAB — HEPATIC FUNCTION PANEL
ALBUMIN: 4.2 g/dL (ref 3.5–5.2)
ALT: 12 U/L (ref 0–35)
AST: 17 U/L (ref 0–37)
Alkaline Phosphatase: 59 U/L (ref 39–117)
BILIRUBIN TOTAL: 0.7 mg/dL (ref 0.2–1.2)
Bilirubin, Direct: 0.1 mg/dL (ref 0.0–0.3)
Indirect Bilirubin: 0.6 mg/dL (ref 0.2–1.2)
Total Protein: 6.2 g/dL (ref 6.0–8.3)

## 2014-02-04 NOTE — Progress Notes (Signed)
Pt came in for labs only 

## 2014-02-05 LAB — VITAMIN D 25 HYDROXY (VIT D DEFICIENCY, FRACTURES): Vit D, 25-Hydroxy: 43 ng/mL (ref 30–89)

## 2014-02-06 LAB — NMR LIPOPROFILE WITH LIPIDS
Cholesterol, Total: 223 mg/dL — ABNORMAL HIGH (ref <200)
HDL Particle Number: 45.5 umol/L (ref >=30.5)
HDL SIZE: 9.5 nm (ref >=9.2)
HDL-C: 84 mg/dL (ref >=40)
LDL (calc): 123 mg/dL — ABNORMAL HIGH (ref <100)
LDL Particle Number: 1548 nmol/L — ABNORMAL HIGH (ref <1000)
LDL SIZE: 20.8 nm (ref >20.5)
LP-IR Score: <25 (ref <=45)
Large HDL-P: 15.4 umol/L (ref >=4.8)
Small LDL Particle Number: 564 nmol/L — ABNORMAL HIGH (ref <=527)
Triglycerides: 82 mg/dL (ref <150)
VLDL Size: 37.6 nm (ref <=46.6)

## 2014-02-11 ENCOUNTER — Encounter: Payer: Self-pay | Admitting: Family Medicine

## 2014-02-11 ENCOUNTER — Ambulatory Visit (INDEPENDENT_AMBULATORY_CARE_PROVIDER_SITE_OTHER): Payer: Medicare Other | Admitting: Family Medicine

## 2014-02-11 VITALS — BP 132/53 | HR 61 | Temp 97.6°F | Ht 63.0 in | Wt 142.4 lb

## 2014-02-11 DIAGNOSIS — E785 Hyperlipidemia, unspecified: Secondary | ICD-10-CM

## 2014-02-11 DIAGNOSIS — M810 Age-related osteoporosis without current pathological fracture: Secondary | ICD-10-CM

## 2014-02-11 DIAGNOSIS — I10 Essential (primary) hypertension: Secondary | ICD-10-CM

## 2014-02-11 DIAGNOSIS — N189 Chronic kidney disease, unspecified: Secondary | ICD-10-CM

## 2014-02-11 NOTE — Progress Notes (Signed)
Subjective:    Patient ID: Elizabeth Burch, female    DOB: 1925/12/20, 80 y.o.   MRN: [id number]  HPI Pt here for follow up and management of chronic medical problems. The patient's labs were reviewed with her and she was given a copy of the labs to take 2 her other doctors. The patient has allergies to many different drugs. She also has chronic kidney disease. She is planning to see the cardiologist soon and the nephrologist sometime in June. She also sees a rheumatologist. Her complaints today are minimal and her lab work appears to be stable.       Patient Active Problem List   Diagnosis Date Noted  . Osteoporosis 12/24/2013  . Kyphosis 11/03/2013  . Acute venous embolism and thrombosis of deep vessels of distal lower extremity 08/26/2013  . 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 Fibrocystic disease of right breast 05/22/2013  . DVT (deep venous thrombosis) 02/16/2013  . Leg edema, left 09/16/2012  . Gout   . CKD (chronic kidney disease) 01/08/2012  . Multinodular thyroid 10/25/2011  . Fibrocystic breast disease, left.   . Open-angle glaucoma   . HYPERLIPIDEMIA 01/06/2009  . HYPERTENSION 01/06/2009   Outpatient Encounter Prescriptions as of 02/11/2014  Medication Sig  . amLODipine (NORVASC) 5 MG tablet TAKE ONE TABLET BY MOUTH EVERY DAY  . calcitRIOL (ROCALTROL) 0.25 MCG capsule Take 0.25 mcg by mouth daily.  Marland Kitchen COLCRYS 0.6 MG tablet Take 1 tablet by mouth daily.  . dorzolamide-timolol (COSOPT) 22.3-6.8 MG/ML ophthalmic solution   . febuxostat (ULORIC) 40 MG tablet Take 1 tablet (40 mg total) by mouth daily.  . furosemide (LASIX) 40 MG tablet Take 40 mg by mouth daily.   Marland Kitchen latanoprost (XALATAN) 0.005 % ophthalmic solution Place 1 drop into both eyes at bedtime.   Marland Kitchen losartan (COZAAR) 100 MG tablet TAKE ONE TABLET BY MOUTH ONCE DAILY  . metoprolol succinate (TOPROL-XL) 25 MG 24 hr tablet TAKE ONE TABLET BY MOUTH EVERY DAY  . Omega-3 Fatty Acids (FISH OIL) 1000  MG CAPS Take 1,000 mg by mouth daily.  . pantoprazole (PROTONIX) 40 MG tablet TAKE ONE TABLET BY MOUTH ONCE DAILY  . warfarin (COUMADIN) 2 MG tablet Take 1 or 2 tablets daily as instructed by anticoagulation clinic  . [DISCONTINUED] chlorpheniramine-HYDROcodone (TUSSIONEX PENNKINETIC ER) 10-8 MG/5ML LQCR 1 teaspoon at bedtime as needed for severe cough    Review of Systems  Constitutional: Negative.   HENT: Negative.   Eyes: Negative.   Respiratory: Negative.   Cardiovascular: Negative.   Gastrointestinal: Negative.   Endocrine: Negative.   Genitourinary: Negative.   Musculoskeletal: Negative.   Skin: Negative.   Allergic/Immunologic: Negative.   Neurological: Negative.   Hematological: Negative.   Psychiatric/Behavioral: Negative.        Objective:   Physical Exam  Nursing note and vitals reviewed. Constitutional: She is oriented to person, place, and time. She appears well-developed and well-nourished. No distress.  HENT:  Head: Normocephalic and atraumatic.  Right Ear: External ear normal.  Left Ear: External ear normal.  Nose: Nose normal.  Mouth/Throat: Oropharynx is clear and moist.  Eyes: Conjunctivae and EOM are normal. Pupils are equal, round, and reactive to light. Right eye exhibits no discharge. Left eye exhibits no discharge. No scleral icterus.  Neck: Normal range of motion. Neck supple. No thyromegaly present.  No carotid bruits were auscultated  Cardiovascular: Normal rate, regular rhythm, normal heart sounds and intact distal pulses.  Exam reveals no gallop  and no friction rub.   No murmur heard. At 72 per minute  Pulmonary/Chest: Effort normal and breath sounds normal. No respiratory distress. She has no wheezes. She has no rales.  Abdominal: Soft. Bowel sounds are normal. She exhibits no mass. There is no tenderness. There is no rebound and no guarding.  Musculoskeletal: Normal range of motion. She exhibits no edema and no tenderness.  Lymphadenopathy:     She has no cervical adenopathy.  Neurological: She is alert and oriented to person, place, and time. She has normal reflexes. No cranial nerve deficit.  Skin: Skin is warm and dry.  Psychiatric: She has a normal mood and affect. Her behavior is normal. Judgment and thought content normal.   BP 132/53  Pulse 61  Temp(Src) 97.6 F (36.4 C) (Oral)  Ht 5\' 3"  (1.6 m)  Wt 142 lb 6.4 oz (64.592 kg)  BMI 25.23 kg/m2        Assessment & Plan:   1. CKD (chronic kidney disease)  2. HYPERLIPIDEMIA  3. HYPERTENSION  4. Osteoporosis  Patient Instructions                       Medicare Annual Wellness Visit  [facility] and the medical providers at [facility] strive to bring you the best medical care.  In doing so we not only want to address your current medical conditions and concerns but also to detect new conditions early and prevent illness, disease and health-related problems.    Medicare offers a yearly Wellness Visit which allows our clinical staff to assess your need for preventative services including immunizations, lifestyle education, counseling to decrease risk of preventable diseases and screening for fall risk and other medical concerns.    This visit is provided free of charge (no copay) for all Medicare recipients. The clinical pharmacists at [facility] have begun to conduct these Wellness Visits which will also include a thorough review of all your medications.    As you primary medical provider recommend that you make an appointment for your Annual Wellness Visit if you have not done so already this year.  You may set up this appointment before you leave today or you may call back [phone] and schedule an appointment.  Please make sure when you call that you mention that you are scheduling your Annual Wellness Visit with the clinical pharmacist so that the appointment may be made for the proper length of time.     Continue  current medications. Continue good therapeutic lifestyle changes which include good diet and exercise. Fall precautions discussed with patient. If an FOBT was given today- please return it to our front desk. If you are over 8 years old - you may need Prevnar 68 or the adult Pneumonia vaccine.  Followup with specialist as planned Take record of lab work especially to the nephrologist.   Arrie Senate MD

## 2014-02-11 NOTE — Patient Instructions (Addendum)
Medicare Annual Wellness Visit  [facility] and the medical providers at [facility] strive to bring you the best medical care.  In doing so we not only want to address your current medical conditions and concerns but also to detect new conditions early and prevent illness, disease and health-related problems.    Medicare offers a yearly Wellness Visit which allows our clinical staff to assess your need for preventative services including immunizations, lifestyle education, counseling to decrease risk of preventable diseases and screening for fall risk and other medical concerns.    This visit is provided free of charge (no copay) for all Medicare recipients. The clinical pharmacists at [facility] have begun to conduct these Wellness Visits which will also include a thorough review of all your medications.    As you primary medical provider recommend that you make an appointment for your Annual Wellness Visit if you have not done so already this year.  You may set up this appointment before you leave today or you may call back [phone] and schedule an appointment.  Please make sure when you call that you mention that you are scheduling your Annual Wellness Visit with the clinical pharmacist so that the appointment may be made for the proper length of time.     Continue current medications. Continue good therapeutic lifestyle changes which include good diet and exercise. Fall precautions discussed with patient. If an FOBT was given today- please return it to our front desk. If you are over 2 years old - you may need Prevnar 28 or the adult Pneumonia vaccine.  Followup with specialist as planned Take record of lab work especially to the nephrologist.

## 2014-02-16 ENCOUNTER — Ambulatory Visit: Payer: Medicare Other | Admitting: Cardiology

## 2014-02-16 ENCOUNTER — Encounter: Payer: Self-pay | Admitting: Cardiology

## 2014-02-16 ENCOUNTER — Ambulatory Visit (INDEPENDENT_AMBULATORY_CARE_PROVIDER_SITE_OTHER): Payer: Medicare Other | Admitting: Cardiology

## 2014-02-16 VITALS — BP 150/58 | HR 56 | Ht 63.0 in | Wt 142.0 lb

## 2014-02-16 DIAGNOSIS — I10 Essential (primary) hypertension: Secondary | ICD-10-CM

## 2014-02-16 DIAGNOSIS — E785 Hyperlipidemia, unspecified: Secondary | ICD-10-CM

## 2014-02-16 DIAGNOSIS — I82409 Acute embolism and thrombosis of unspecified deep veins of unspecified lower extremity: Secondary | ICD-10-CM

## 2014-02-16 NOTE — Progress Notes (Signed)
Patient ID: Elizabeth Burch, female   DOB: 04-03-1926, 80 y.o.   MRN: [id number] PCP: Dr. Laurance Flatten  80 yo with history of HTN, CKD, and palpitations returns for cardiology followup. She developed a spontaneous DVT on the left in 10/13 with negative hypercoagulable workup.  She is now on coumadin.  No exertional chest pain or dyspnea. She continues to walk 1-2 miles a day on her treadmill with no problems.  She is steady on her feet.  SBP 120s-140s when she checks at home.  Minimal palpitations on Toprol XL.  LDL particle number is high but she has been intolerant of a number of statins.   Labs (7/10): K 5.7, creatinine 1.44  Labs (11/10): K 4.6, creatinine 1.38  Labs (9/11): HDL 96, LDL 69, creatinine  Labs (12/11): TSH normal  Labs (11/12): K 4.5, creatinine 1.98 Labs (1/14): K 4.3, creatinine 1.78 Labas (3/15): K 4.4, creatinine 2.05, LDL particle number [id number], LDL 123  ECG: NSR, normal  Allergies (verified):  1) ! Macrodantin  2) ! Cipro  3) ! Pcn  4) ! Biaxin  5) ! Fosamax  6) ! Ace Inhibitors  7) ! Floxin  8) ! Sulfa  9) Sulfamethoxazole (Sulfamethoxazole)  10) Penicillin V Potassium (Penicillin V Potassium)   Past Medical History:  1. Hypertension.  2. Hyperlipidemia: Myalgias with multiple statins.  3. Gastroesophageal reflux disease.  4. Fibrocystic breast disease,.  5. History of colon polyps.  6. Palpitations. The patient did have a Holter monitor done in January 2010, showing quite frequent PACs, but no SVT or no other serious arrhythmia. 3 week event monitor in 9/11 showed mild sinus bradycardia to the 50s at times, otherwise unremarkable.  7. Echocardiogram done in January 2010 showed an EF of 70%, normal LV size, no regional Crook motion abnormalities. The valves look normal. There is mild pulmonary hypertension with pulmonary artery systolic pressure of 40 mmHg. Echo (10/11): Mild focal basal septal hypertrophy, EF 123456, grade I diastolic dysfunction, PA systolic  pressure 31 mmHg, borderline atrial septal aneurysm.  8. CKD with mild hyperkalemia  9. Carotid dopplers (10/11): normal.  10. Multinodular goiter 11. Gout 12. DVT: 1st episode after childbirth, then another she thinks early 2000s, third episode 10/13.   Family History:  The patient's brother had an MI in his 74s.   Social History:  Married, lives in [place].  Tobacco Use - No.  Alcohol Use - no  Current Outpatient Prescriptions  Medication Sig Dispense Refill  . amLODipine (NORVASC) 5 MG tablet TAKE ONE TABLET BY MOUTH EVERY DAY  30 tablet  11  . calcitRIOL (ROCALTROL) 0.25 MCG capsule Take 0.25 mcg by mouth daily.      Marland Kitchen COLCRYS 0.6 MG tablet Take 1 tablet by mouth daily.      . dorzolamide-timolol (COSOPT) 22.3-6.8 MG/ML ophthalmic solution       . febuxostat (ULORIC) 40 MG tablet Take 1 tablet (40 mg total) by mouth daily.  14 tablet  0  . furosemide (LASIX) 40 MG tablet Take 40 mg by mouth daily.       Marland Kitchen latanoprost (XALATAN) 0.005 % ophthalmic solution Place 1 drop into both eyes at bedtime.       Marland Kitchen losartan (COZAAR) 100 MG tablet TAKE ONE TABLET BY MOUTH ONCE DAILY  30 tablet  2  . metoprolol succinate (TOPROL-XL) 25 MG 24 hr tablet TAKE ONE TABLET BY MOUTH EVERY DAY  30 tablet  3  . Omega-3 Fatty Acids (FISH OIL) 1000  MG CAPS Take 1,000 mg by mouth daily.      . pantoprazole (PROTONIX) 40 MG tablet TAKE ONE TABLET BY MOUTH ONCE DAILY  30 tablet  3  . warfarin (COUMADIN) 2 MG tablet Take 1 or 2 tablets daily as instructed by anticoagulation clinic  40 tablet  3   No current facility-administered medications for this visit.    BP 150/58  Pulse 56  Ht 5\' 3"  (1.6 m)  Wt 64.411 kg (142 lb)  BMI 25.16 kg/m2 General: NAD Neck: No JVD, no thyromegaly or thyroid nodule.  Lungs: Clear to auscultation bilaterally with normal respiratory effort. CV: Nondisplaced PMI.  Heart regular S1/S2, no S3/S4, 1/6 early SEM RUSB.  1+ ankle edema.  No carotid bruit.  Normal pedal pulses.   Abdomen: Soft, nontender, no hepatosplenomegaly, no distention.  Neurologic: Alert and oriented x 3.  Psych: Normal affect. Extremities: No clubbing or cyanosis.   Assessment/Plan: 1. HTN: Seems to be acceptably well-controlled.  SBP [id number] systolic when she checks at home.  2. DVT: Left DVT found in 10/13.  She is on coumadin.  This was a spontaneous DVT.  Sounds like she has had DVTs (potentially 2) in the past.  Hypercoagulable workup was unremarkable.  I would continue coumadin long-term.  3. CKD: Stable, follows with nephrology.  4. Hyperlipidemia: She has been unable to tolerate any statin due to myalgias.   [name] 02/16/2014

## 2014-02-16 NOTE — Patient Instructions (Signed)
Your physician wants you to follow-up in: 1 year with Dr Aundra Dubin. (March 2016).  You will receive a reminder letter in the mail two months in advance. If you don't receive a letter, please call our office to schedule the follow-up appointment.

## 2014-02-23 ENCOUNTER — Ambulatory Visit (INDEPENDENT_AMBULATORY_CARE_PROVIDER_SITE_OTHER): Payer: Medicare Other | Admitting: Pharmacist

## 2014-02-23 DIAGNOSIS — I82409 Acute embolism and thrombosis of unspecified deep veins of unspecified lower extremity: Secondary | ICD-10-CM

## 2014-02-23 LAB — POCT INR: INR: 1.8

## 2014-02-23 NOTE — Patient Instructions (Signed)
Anticoagulation Dose Instructions as of 02/23/2014     Elizabeth Burch Tue Wed Thu Fri Sat   New Dose 2 mg 4 mg 2 mg 2 mg 2 mg 4 mg 2 mg    Description        dose of 2mg  daily except 4mg  (= 2 tablets) on mondays and fridays       INR was 1.8 today

## 2014-03-11 ENCOUNTER — Other Ambulatory Visit (INDEPENDENT_AMBULATORY_CARE_PROVIDER_SITE_OTHER): Payer: Self-pay | Admitting: Surgery

## 2014-03-11 DIAGNOSIS — Z1231 Encounter for screening mammogram for malignant neoplasm of breast: Secondary | ICD-10-CM

## 2014-03-17 ENCOUNTER — Other Ambulatory Visit: Payer: Self-pay | Admitting: Cardiology

## 2014-03-18 ENCOUNTER — Ambulatory Visit (INDEPENDENT_AMBULATORY_CARE_PROVIDER_SITE_OTHER): Payer: Medicare Other | Admitting: Pharmacist

## 2014-03-18 DIAGNOSIS — I82409 Acute embolism and thrombosis of unspecified deep veins of unspecified lower extremity: Secondary | ICD-10-CM

## 2014-03-18 LAB — POCT INR: INR: 1.7

## 2014-03-18 NOTE — Patient Instructions (Signed)
Anticoagulation Dose Instructions as of 03/18/2014     Elizabeth Burch Tue Wed Thu Fri Sat   New Dose 2 mg 4 mg 2 mg 4 mg 2 mg 4 mg 2 mg    Description        Increase dose to 4mg  (2 tablets) on Mondays, Wednesdays and Fridays and 2mg  (1 tablet) all other days.       INR was 1.7 today

## 2014-03-26 ENCOUNTER — Other Ambulatory Visit: Payer: Self-pay | Admitting: Pharmacist

## 2014-03-27 ENCOUNTER — Other Ambulatory Visit: Payer: Self-pay

## 2014-03-27 MED ORDER — AMLODIPINE BESYLATE 5 MG PO TABS: ORAL_TABLET | ORAL | Status: DC

## 2014-04-03 ENCOUNTER — Telehealth: Payer: Self-pay | Admitting: Family Medicine

## 2014-04-03 NOTE — Telephone Encounter (Signed)
Pt called - if anything comes up we will call her back

## 2014-04-09 ENCOUNTER — Ambulatory Visit (HOSPITAL_COMMUNITY)
Admission: RE | Admit: 2014-04-09 | Discharge: 2014-04-09 | Disposition: A | Discharge status: Home / Self Care | Payer: Medicare Other | Source: Ambulatory Visit | Attending: Surgery | Admitting: Surgery

## 2014-04-09 ENCOUNTER — Ambulatory Visit (INDEPENDENT_AMBULATORY_CARE_PROVIDER_SITE_OTHER): Payer: Medicare Other | Admitting: Pharmacist

## 2014-04-09 DIAGNOSIS — I82409 Acute embolism and thrombosis of unspecified deep veins of unspecified lower extremity: Secondary | ICD-10-CM

## 2014-04-09 DIAGNOSIS — Z1231 Encounter for screening mammogram for malignant neoplasm of breast: Secondary | ICD-10-CM | POA: Insufficient documentation

## 2014-04-09 LAB — POCT INR: INR: 3.8

## 2014-04-09 NOTE — Patient Instructions (Signed)
Anticoagulation Dose Instructions as of 04/09/2014     [name] Tue Wed Thu Fri Sat   New Dose 3 mg 2 mg 3 mg 3 mg 3 mg 2 mg 3 mg    Description        No warfarin today. Then decease dose to 1 tablet on Mondays and Fridays and  1 and 1/2 tablet  all other days.       INR was 3.8 today

## 2014-04-22 ENCOUNTER — Ambulatory Visit (INDEPENDENT_AMBULATORY_CARE_PROVIDER_SITE_OTHER): Payer: Medicare Other | Admitting: Surgery

## 2014-04-22 ENCOUNTER — Ambulatory Visit (INDEPENDENT_AMBULATORY_CARE_PROVIDER_SITE_OTHER): Payer: Medicare Other | Admitting: Pharmacist

## 2014-04-22 ENCOUNTER — Other Ambulatory Visit: Payer: Self-pay | Admitting: Pharmacist

## 2014-04-22 VITALS — BP 152/68 | HR 70 | Temp 97.2°F | Resp 12 | Ht 62.0 in | Wt 144.4 lb

## 2014-04-22 DIAGNOSIS — N6019 Diffuse cystic mastopathy of unspecified breast: Secondary | ICD-10-CM

## 2014-04-22 DIAGNOSIS — N6011 Diffuse cystic mastopathy of right breast: Secondary | ICD-10-CM

## 2014-04-22 DIAGNOSIS — I82409 Acute embolism and thrombosis of unspecified deep veins of unspecified lower extremity: Secondary | ICD-10-CM

## 2014-04-22 LAB — POCT INR: INR: 2.5

## 2014-04-22 MED ORDER — WARFARIN SODIUM 2 MG PO TABS: ORAL_TABLET | ORAL | Status: DC

## 2014-04-22 NOTE — Patient Instructions (Signed)
Anticoagulation Dose Instructions as of 04/22/2014     Elizabeth Burch Tue Wed Thu Fri Sat   New Dose 3 mg 2 mg 3 mg 3 mg 3 mg 2 mg 3 mg    Description       Continue current warfarin dose of 1 tablet on Mondays and Fridays and  1 and 1/2 tablet  all other days.       INR was 2.5 today

## 2014-04-22 NOTE — Progress Notes (Signed)
[place], MD,  [address]  [place], [place]    [place] Phone:  [phone] FAX:  [phone]   Re:   Elizabeth Burch DOB:   04/08/1926 MRN:   [id number]  ASSESSMENT AND PLAN: 1.  Breast mastopathy.    No new mass or problem.  Negative mammogram.  She has breast density category "c" on mammogram.  She'll see me in 1 year.  We talked about her stopping seeing me, but she is healthy enough that she still wants to see me.  2.  Multinodular thyroid gland.  Normal thyroid function.  [place] unchanged on  10/11/2011.  Stable.  No further [place] unless there is a change in her exam.   3.  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  Taken care of by Dr. Rigoberto Noel at [facility].  He has released her. 4.  Hypertension. 5.  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 Amil Amen 6.  She has asked about the next colonoscopy.    Dr. Sharlett Iles did one in 2003.  I did a right colectomy for benign peritoneal cysts.  I would look at the next colonoscopy around 10 years from my surgery - 2116, if her health is good.  This could be discussed at that time. 7.  Chronic renal disease, stage 3  Seen by Dr. Mercy Moore - 10/07/2012. 8.  Left leg DVT in fall of 2013.  But she has apparently had several.  She has never had a PE.  Saw Dr. Solon Palm.  (last seen by him 04/09/2013)  Her hypercoagulable panel was negative.  He has suggested long term coumadin treatment.  Her last INR - 04/09/2014 - 3.8 9.  Right shoulder droops  HISTORY OF PRESENT ILLNESS: Chief Complaint  Patient presents with  . Follow-up    Left breast Fibercystic breast    Elizabeth Burch is a 80 y.o. (DOB: [birth year])  white female who is a patient of MOORE, Elenore Rota, MD and comes to me today for check thyroid and breast check. She comes by herself today.  She has no new issues or problems.  She looks good.  History of breast disease: I follow the patient for diffuse mastopathy. She had 3 sisters who had breast  cancer. She originally was followed by Dr. Gretta Cool.  I also check her thyroid. She has no new breast or thyroid complaints.  Her biggest problem has been with gout in her hands and she developed a DVT in her left calf last year.  She is on coumadin, has recently seen Dr. Jerilynn Mages.  PHYSICAL EXAM: BP 152/68  Pulse 70  Temp(Src) 97.2 F (36.2 C) (Temporal)  Resp 12  Ht 5\' 2"  (1.575 m)  Wt 144 lb 6.4 oz (65.499 kg)  BMI 26.40 kg/m2  HEENT:  Pupils equal.  Dentition good.   NECK: No specific mass in thyroid, but lumpy. LYMPH NODES:  No cervical, supraclavicular, or axillary adenopathy. Lungs:  Clear Heart:  RRR.  No murmur. BREASTS -  RIGHT:  No palpable mass or nodule.  No nipple discharge.   LEFT:  No palpable mass or nodule.  No nipple discharge. UPPER EXTREMITIES:  No evidence of lymphedema.  DATA REVIEWED: Mammogram [facility]) - 04/09/2014 - negative  Alphonsa Overall, MD, [place] Office:  [phone]

## 2014-04-23 ENCOUNTER — Other Ambulatory Visit: Payer: Self-pay | Admitting: Family Medicine

## 2014-05-11 ENCOUNTER — Ambulatory Visit (INDEPENDENT_AMBULATORY_CARE_PROVIDER_SITE_OTHER): Payer: Medicare Other | Admitting: Pharmacist

## 2014-05-11 DIAGNOSIS — I82409 Acute embolism and thrombosis of unspecified deep veins of unspecified lower extremity: Secondary | ICD-10-CM

## 2014-05-11 LAB — POCT INR: INR: 2.8

## 2014-05-11 NOTE — Patient Instructions (Signed)
Anticoagulation Dose Instructions as of 05/11/2014     Elizabeth Burch Tue Wed Thu Fri Sat   New Dose 3 mg 2 mg 3 mg 3 mg 3 mg 2 mg 3 mg    Description       Continue current warfarin dose of 1 tablet on Mondays and Fridays and  1 and 1/2 tablet  all other days.       INR was 2.8 today

## 2014-05-26 ENCOUNTER — Telehealth: Payer: Self-pay | Admitting: Family Medicine

## 2014-05-26 MED ORDER — PANTOPRAZOLE SODIUM 40 MG PO TBEC
DELAYED_RELEASE_TABLET | ORAL | Status: DC
Start: 2014-05-26 — End: 2014-09-16

## 2014-05-26 NOTE — Telephone Encounter (Signed)
Protonix sent over to the pharmacy patient aware

## 2014-06-10 ENCOUNTER — Ambulatory Visit (INDEPENDENT_AMBULATORY_CARE_PROVIDER_SITE_OTHER): Payer: Medicare Other | Admitting: Pharmacist

## 2014-06-10 DIAGNOSIS — I82409 Acute embolism and thrombosis of unspecified deep veins of unspecified lower extremity: Secondary | ICD-10-CM

## 2014-06-10 LAB — POCT INR: INR: 2.1

## 2014-06-10 NOTE — Patient Instructions (Signed)
Anticoagulation Dose Instructions as of 06/10/2014     [name] Tue Wed Thu Fri Sat   New Dose 3 mg 2 mg 3 mg 3 mg 3 mg 2 mg 3 mg    Description       Continue current warfarin dose of 1 tablet on Mondays and Fridays and  1 and 1/2 tablet  all other days.       INR was 2.1 today

## 2014-06-17 ENCOUNTER — Encounter: Payer: Self-pay | Admitting: *Deleted

## 2014-06-23 ENCOUNTER — Other Ambulatory Visit (INDEPENDENT_AMBULATORY_CARE_PROVIDER_SITE_OTHER): Payer: Medicare Other

## 2014-06-23 DIAGNOSIS — E785 Hyperlipidemia, unspecified: Secondary | ICD-10-CM

## 2014-06-23 DIAGNOSIS — I10 Essential (primary) hypertension: Secondary | ICD-10-CM

## 2014-06-23 DIAGNOSIS — E559 Vitamin D deficiency, unspecified: Secondary | ICD-10-CM

## 2014-06-23 LAB — POCT CBC
Granulocyte percent: 69.4 %G (ref 37–80)
HCT, POC: 32.7 % — AB (ref 37.7–47.9)
HEMOGLOBIN: 11.4 g/dL — AB (ref 12.2–16.2)
Lymph, poc: 2 (ref 0.6–3.4)
MCH, POC: 32.5 pg — AB (ref 27–31.2)
MCHC: 35 g/dL (ref 31.8–35.4)
MCV: 92.9 fL (ref 80–97)
MPV: 7.5 fL (ref 0–99.8)
POC GRANULOCYTE: 5.8 (ref 2–6.9)
POC LYMPH %: 23.3 % (ref 10–50)
Platelet Count, POC: 213 10*3/uL (ref 142–424)
RBC: 3.5 M/uL — AB (ref 4.04–5.48)
RDW, POC: 13.9 %
WBC: 8.4 10*3/uL (ref 4.6–10.2)

## 2014-06-23 NOTE — Progress Notes (Signed)
Pt came in for labs only 

## 2014-06-24 ENCOUNTER — Telehealth: Payer: Self-pay | Admitting: Family Medicine

## 2014-06-24 LAB — BMP8+EGFR
BUN/Creatinine Ratio: 16 (ref 11–26)
BUN: 43 mg/dL — AB (ref 8–27)
CALCIUM: 10.7 mg/dL — AB (ref 8.7–10.3)
CO2: 24 mmol/L (ref 18–29)
CREATININE: 2.65 mg/dL — AB (ref 0.57–1.00)
Chloride: 105 mmol/L (ref 97–108)
GFR calc non Af Amer: 16 mL/min/{1.73_m2} — ABNORMAL LOW (ref >59)
GFR, EST AFRICAN AMERICAN: 18 mL/min/{1.73_m2} — AB (ref >59)
GLUCOSE: 91 mg/dL (ref 65–99)
Potassium: 5.2 mmol/L (ref 3.5–5.2)
Sodium: 145 mmol/L — ABNORMAL HIGH (ref 134–144)

## 2014-06-24 LAB — NMR, LIPOPROFILE
CHOLESTEROL: 229 mg/dL — AB (ref 100–199)
HDL Cholesterol by NMR: 84 mg/dL (ref >39)
HDL PARTICLE NUMBER: 43.6 umol/L (ref >=30.5)
LDL PARTICLE NUMBER: 1398 nmol/L — AB (ref <1000)
LDL SIZE: 21.1 nm (ref >20.5)
LDLC SERPL CALC-MCNC: 121 mg/dL — ABNORMAL HIGH (ref 0–99)
LP-IR Score: <25 (ref <=45)
SMALL LDL PARTICLE NUMBER: 531 nmol/L — AB (ref <=527)
TRIGLYCERIDES BY NMR: 119 mg/dL (ref 0–149)

## 2014-06-24 LAB — HEPATIC FUNCTION PANEL
ALBUMIN: 4.3 g/dL (ref 3.5–4.7)
ALT: 10 IU/L (ref 0–32)
AST: 18 IU/L (ref 0–40)
Alkaline Phosphatase: 68 IU/L (ref 39–117)
BILIRUBIN DIRECT: 0.12 mg/dL (ref 0.00–0.40)
BILIRUBIN TOTAL: 0.5 mg/dL (ref 0.0–1.2)
TOTAL PROTEIN: 6.1 g/dL (ref 6.0–8.5)

## 2014-06-24 LAB — VITAMIN D 25 HYDROXY (VIT D DEFICIENCY, FRACTURES): Vit D, 25-Hydroxy: 30.1 ng/mL (ref 30.0–100.0)

## 2014-06-24 NOTE — Telephone Encounter (Signed)
Message copied by Waverly Ferrari on Wed Jun 24, 2014 11:27 AM ------      Message from: Chipper Herb      Created: Wed Jun 24, 2014  8:16 AM       The blood sugar was good at 91. The creatinine is elevated at 2.65. This is more than it has been in the past. The electrolytes were within normal limits except sodium was slightly elevated and serum calcium was elevated at 10.7.------ please repeat the BMP at the time of the patient's next visit. Please send a copy of this note to her nephrologist----this is very important++++++      All liver function tests are within normal limits      With advanced lipid testing a total LDL particle number is elevated at 1398. The LDL C. is elevated at 121. The triglycerides are good at 119.----Please reemphasize to her diet and exercise regarding her cholesterol. Also please confirm with her that she cannot take statin drugs+++++ ------

## 2014-06-25 NOTE — Telephone Encounter (Signed)
Patient aware.

## 2014-06-30 ENCOUNTER — Ambulatory Visit: Payer: Medicare Other | Admitting: Family Medicine

## 2014-07-02 ENCOUNTER — Ambulatory Visit: Payer: Medicare Other | Admitting: Family Medicine

## 2014-07-06 ENCOUNTER — Encounter: Payer: Self-pay | Admitting: Family Medicine

## 2014-07-06 ENCOUNTER — Ambulatory Visit (INDEPENDENT_AMBULATORY_CARE_PROVIDER_SITE_OTHER): Payer: Medicare Other | Admitting: Family Medicine

## 2014-07-06 VITALS — BP 155/55 | HR 53 | Temp 97.7°F | Ht 62.0 in | Wt 141.0 lb

## 2014-07-06 DIAGNOSIS — I10 Essential (primary) hypertension: Secondary | ICD-10-CM

## 2014-07-06 DIAGNOSIS — M109 Gout, unspecified: Secondary | ICD-10-CM

## 2014-07-06 DIAGNOSIS — R7989 Other specified abnormal findings of blood chemistry: Secondary | ICD-10-CM

## 2014-07-06 DIAGNOSIS — R799 Abnormal finding of blood chemistry, unspecified: Secondary | ICD-10-CM

## 2014-07-06 DIAGNOSIS — E785 Hyperlipidemia, unspecified: Secondary | ICD-10-CM

## 2014-07-06 NOTE — Progress Notes (Signed)
Subjective:    Patient ID: Elizabeth Burch, female    DOB: 01/11/1926, 80 y.o.   MRN: [id number]  HPI Pt here for follow up and management of chronic medical problems. This patient is currently seeing the cardiologist, the rheumatologist, and the nephrologist. As usual, she is not a complainer and has no complaints today. Her recent lab work will be reviewed with her during the visit today. It is of significance that her creatinine is higher than usual at 2.65 we will find that when she has seen the nephrologist last, and when she has plans to see him again. The potassium is good. The remainder of the lab work is good except her cholesterol numbers are elevated. There is no uric acid on the blood work and we will have the lab add this to the report         Patient Active Problem List   Diagnosis Date Noted  . Osteoporosis 12/24/2013  . Kyphosis 11/03/2013  . Acute venous embolism and thrombosis of deep vessels of distal lower extremity 08/26/2013  . 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 Fibrocystic disease of right breast 05/22/2013  . DVT (deep venous thrombosis) 02/16/2013  . Leg edema, left 09/16/2012  . Gout   . CKD (chronic kidney disease) 01/08/2012  . Multinodular thyroid 10/25/2011  . Fibrocystic breast disease, left.   . Open-angle glaucoma   . HYPERLIPIDEMIA 01/06/2009  . HYPERTENSION 01/06/2009   Outpatient Encounter Prescriptions as of 07/06/2014  Medication Sig  . amLODipine (NORVASC) 5 MG tablet TAKE ONE TABLET BY MOUTH EVERY DAY  . calcitRIOL (ROCALTROL) 0.25 MCG capsule Take 0.25 mcg by mouth daily.  . dorzolamide-timolol (COSOPT) 22.3-6.8 MG/ML ophthalmic solution   . febuxostat (ULORIC) 40 MG tablet Take 1 tablet (40 mg total) by mouth daily.  . furosemide (LASIX) 40 MG tablet Take 40 mg by mouth daily.   Marland Kitchen latanoprost (XALATAN) 0.005 % ophthalmic solution Place 1 drop into both eyes at bedtime.   Marland Kitchen losartan (COZAAR) 100 MG tablet TAKE ONE  TABLET BY MOUTH ONCE DAILY  . metoprolol succinate (TOPROL-XL) 25 MG 24 hr tablet TAKE ONE TABLET BY MOUTH ONCE DAILY  . Omega-3 Fatty Acids (FISH OIL) 1000 MG CAPS Take 1,000 mg by mouth daily.  . pantoprazole (PROTONIX) 40 MG tablet TAKE ONE TABLET BY MOUTH ONCE DAILY  . warfarin (COUMADIN) 2 MG tablet Take 1 and 1/2 tablets by mouth once daily or as directed by anticoagulation clinic  . COLCRYS 0.6 MG tablet Take 1 tablet by mouth daily.    Review of Systems  Constitutional: Negative.   HENT: Negative.   Eyes: Negative.   Respiratory: Negative.   Cardiovascular: Negative.   Gastrointestinal: Negative.   Endocrine: Negative.   Genitourinary: Negative.   Musculoskeletal: Negative.   Skin: Negative.   Allergic/Immunologic: Negative.   Neurological: Negative.   Hematological: Negative.   Psychiatric/Behavioral: Negative.        Objective:   Physical Exam  Nursing note and vitals reviewed. Constitutional: She is oriented to person, place, and time. She appears well-developed and well-nourished. No distress.  The patient is pleasant and cooperative as usual and understands the significance of her elevated creatinine  HENT:  Head: Normocephalic and atraumatic.  Right Ear: External ear normal.  Left Ear: External ear normal.  Nose: Nose normal.  Mouth/Throat: Oropharynx is clear and moist. No oropharyngeal exudate.  Eyes: Conjunctivae and EOM are normal. Pupils are equal, round, and reactive to light. Right  eye exhibits no discharge. Left eye exhibits no discharge. No scleral icterus.  Neck: Normal range of motion. Neck supple. No thyromegaly present.  There is a left supraclavicular bruit  Cardiovascular: Normal rate, regular rhythm, normal heart sounds and intact distal pulses.  Exam reveals no gallop and no friction rub.   No murmur heard. At 72 per minute  Pulmonary/Chest: Effort normal and breath sounds normal. No respiratory distress. She has no wheezes. She has no rales.  She exhibits no tenderness.  Abdominal: Soft. Bowel sounds are normal. She exhibits no mass. There is no tenderness. There is no rebound and no guarding.  There was no abdominal tenderness or masses palpated.  Musculoskeletal: Normal range of motion. She exhibits no edema and no tenderness.   Thee is no specific joint swelling or edema.  Lymphadenopathy:    She has no cervical adenopathy.  Neurological: She is alert and oriented to person, place, and time. She has normal reflexes. No cranial nerve deficit.  Skin: Skin is warm and dry. No rash noted.  Psychiatric: She has a normal mood and affect. Her behavior is normal. Judgment and thought content normal.   BP 155/55  Pulse 53  Temp(Src) 97.7 F (36.5 C) (Oral)  Ht _0  (1.575 m)  Wt 141 lb (63.957 kg)  BMI 25.78 kg/m2        Assessment & Plan:  1. HYPERLIPIDEMIA  2. HYPERTENSION  3. Elevated serum creatinine - BMP8+EGFR  4. Gout, unspecified - Uric acid  No orders of the defined types were placed in this encounter.   Patient Instructions                       Medicare Annual Wellness Visit  [facility] and the medical providers at [facility] strive to bring you the best medical care.  In doing so we not only want to address your current medical conditions and concerns but also to detect new conditions early and prevent illness, disease and health-related problems.    Medicare offers a yearly Wellness Visit which allows our clinical staff to assess your need for preventative services including immunizations, lifestyle education, counseling to decrease risk of preventable diseases and screening for fall risk and other medical concerns.    This visit is provided free of charge (no copay) for all Medicare recipients. The clinical pharmacists at [facility] have begun to conduct these Wellness Visits which will also include a thorough review of all your medications.    As you  primary medical provider recommend that you make an appointment for your Annual Wellness Visit if you have not done so already this year.  You may set up this appointment before you leave today or you may call back [phone] and schedule an appointment.  Please make sure when you call that you mention that you are scheduling your Annual Wellness Visit with the clinical pharmacist so that the appointment may be made for the proper length of time.     Continue current medications. Continue good therapeutic lifestyle changes which include good diet and exercise. Fall precautions discussed with patient. If an FOBT was given today- please return it to our front desk. If you are over 20 years old - you may need Prevnar 16 or the adult Pneumonia vaccine.  Please continue to monitor blood pressures at home Continue to avoid NSAIDs Take blood pressure readings with you when you see the nephrologist Return the FOBT Take lab  work reviewed to see the nephrologist Return to clinic this fall for your pelvic exam   Arrie Senate MD

## 2014-07-06 NOTE — Patient Instructions (Addendum)
Medicare Annual Wellness Visit  [facility] and the medical providers at [facility] strive to bring you the best medical care.  In doing so we not only want to address your current medical conditions and concerns but also to detect new conditions early and prevent illness, disease and health-related problems.    Medicare offers a yearly Wellness Visit which allows our clinical staff to assess your need for preventative services including immunizations, lifestyle education, counseling to decrease risk of preventable diseases and screening for fall risk and other medical concerns.    This visit is provided free of charge (no copay) for all Medicare recipients. The clinical pharmacists at [facility] have begun to conduct these Wellness Visits which will also include a thorough review of all your medications.    As you primary medical provider recommend that you make an appointment for your Annual Wellness Visit if you have not done so already this year.  You may set up this appointment before you leave today or you may call back [phone] and schedule an appointment.  Please make sure when you call that you mention that you are scheduling your Annual Wellness Visit with the clinical pharmacist so that the appointment may be made for the proper length of time.     Continue current medications. Continue good therapeutic lifestyle changes which include good diet and exercise. Fall precautions discussed with patient. If an FOBT was given today- please return it to our front desk. If you are over 73 years old - you may need Prevnar 83 or the adult Pneumonia vaccine.  Please continue to monitor blood pressures at home Continue to avoid NSAIDs Take blood pressure readings with you when you see the nephrologist Return the FOBT Take lab work reviewed to see the nephrologist Return to clinic this fall for your pelvic exam

## 2014-07-07 LAB — BMP8+EGFR
BUN / CREAT RATIO: 23 (ref 11–26)
BUN: 50 mg/dL — ABNORMAL HIGH (ref 8–27)
CHLORIDE: 105 mmol/L (ref 97–108)
CO2: 23 mmol/L (ref 18–29)
Calcium: 9.9 mg/dL (ref 8.7–10.3)
Creatinine, Ser: 2.2 mg/dL — ABNORMAL HIGH (ref 0.57–1.00)
GFR calc non Af Amer: 20 mL/min/{1.73_m2} — ABNORMAL LOW (ref >59)
GFR, EST AFRICAN AMERICAN: 23 mL/min/{1.73_m2} — AB (ref >59)
Glucose: 97 mg/dL (ref 65–99)
Potassium: 5 mmol/L (ref 3.5–5.2)
SODIUM: 142 mmol/L (ref 134–144)

## 2014-07-07 LAB — URIC ACID: URIC ACID: 12.3 mg/dL — AB (ref 2.5–7.1)

## 2014-07-16 ENCOUNTER — Encounter: Payer: Self-pay | Admitting: Pharmacist

## 2014-07-16 ENCOUNTER — Ambulatory Visit (INDEPENDENT_AMBULATORY_CARE_PROVIDER_SITE_OTHER): Payer: Medicare Other | Admitting: Pharmacist

## 2014-07-16 VITALS — BP 150/64 | HR 66

## 2014-07-16 DIAGNOSIS — I82409 Acute embolism and thrombosis of unspecified deep veins of unspecified lower extremity: Secondary | ICD-10-CM

## 2014-07-16 LAB — POCT INR: INR: 2.2

## 2014-07-16 NOTE — Patient Instructions (Signed)
Anticoagulation Dose Instructions as of 07/16/2014     Elizabeth Burch Tue Wed Thu Fri Sat   New Dose 3 mg 2 mg 3 mg 3 mg 3 mg 2 mg 3 mg    Description       Continue current warfarin dose of 1 tablet on Mondays and Fridays and  1 and 1/2 tablet  all other days.       INR was 2.2 today

## 2014-07-30 ENCOUNTER — Other Ambulatory Visit: Payer: Self-pay | Admitting: Family Medicine

## 2014-08-27 ENCOUNTER — Ambulatory Visit (INDEPENDENT_AMBULATORY_CARE_PROVIDER_SITE_OTHER): Payer: Medicare Other | Admitting: Pharmacist

## 2014-08-27 DIAGNOSIS — I82409 Acute embolism and thrombosis of unspecified deep veins of unspecified lower extremity: Secondary | ICD-10-CM

## 2014-08-27 LAB — POCT INR: INR: 2.7

## 2014-08-27 NOTE — Patient Instructions (Signed)
Anticoagulation Dose Instructions as of 08/27/2014     Elizabeth Burch Tue Wed Thu Fri Sat   New Dose 3 mg 2 mg 3 mg 3 mg 3 mg 2 mg 3 mg    Description       Continue current warfarin dose of 1 tablet on Mondays and Fridays and  1 and 1/2 tablet  all other days.       INR was 2.7 today

## 2014-08-31 ENCOUNTER — Other Ambulatory Visit: Payer: Self-pay | Admitting: Family Medicine

## 2014-09-16 ENCOUNTER — Other Ambulatory Visit: Payer: Self-pay | Admitting: Cardiology

## 2014-09-16 ENCOUNTER — Other Ambulatory Visit: Payer: Self-pay | Admitting: Family Medicine

## 2014-09-22 ENCOUNTER — Ambulatory Visit (INDEPENDENT_AMBULATORY_CARE_PROVIDER_SITE_OTHER): Payer: Medicare Other | Admitting: Nurse Practitioner

## 2014-09-22 ENCOUNTER — Encounter: Payer: Self-pay | Admitting: Nurse Practitioner

## 2014-09-22 VITALS — BP 142/45 | HR 57 | Temp 97.6°F | Ht 62.0 in | Wt 141.5 lb

## 2014-09-22 DIAGNOSIS — Z01419 Encounter for gynecological examination (general) (routine) without abnormal findings: Secondary | ICD-10-CM

## 2014-09-22 LAB — POCT URINALYSIS DIPSTICK
BILIRUBIN UA: NEGATIVE
GLUCOSE UA: NEGATIVE
Ketones, UA: NEGATIVE
NITRITE UA: NEGATIVE
Protein, UA: NEGATIVE
Spec Grav, UA: 1.02
UROBILINOGEN UA: NEGATIVE
pH, UA: 5

## 2014-09-22 LAB — POCT UA - MICROSCOPIC ONLY
Bacteria, U Microscopic: NEGATIVE
Casts, Ur, LPF, POC: NEGATIVE
Crystals, Ur, HPF, POC: NEGATIVE
YEAST UA: NEGATIVE

## 2014-09-22 NOTE — Progress Notes (Signed)
   Subjective:    Patient ID: Elizabeth Burch, female    DOB: 07/31/1926, 80 y.o.   MRN: [id number]  HPI Patient comes in today for Pap and breast exam- she is a regular patient of Dr. Laurance Flatten that was seen for follow up about 2 months ago- She is doing well today without complaints.    Review of Systems  Constitutional: Negative.   HENT: Negative.   Respiratory: Negative.   Cardiovascular: Negative.   Genitourinary: Negative.   Neurological: Negative.   Psychiatric/Behavioral: Negative.   All other systems reviewed and are negative.      Objective:   Physical Exam  Constitutional: She is oriented to person, place, and time. She appears well-developed and well-nourished.  HENT:  Head: Normocephalic.  Right Ear: Hearing, tympanic membrane, external ear and ear canal normal.  Left Ear: Hearing, tympanic membrane, external ear and ear canal normal.  Nose: Nose normal.  Mouth/Throat: Uvula is midline and oropharynx is clear and moist.  Eyes: Conjunctivae and EOM are normal. Pupils are equal, round, and reactive to light.  Neck: Normal range of motion and full passive range of motion without pain. Neck supple. No JVD present. Carotid bruit is not present. No mass and no thyromegaly present.  Cardiovascular: Normal rate, normal heart sounds and intact distal pulses.   No murmur heard. Pulmonary/Chest: Effort normal and breath sounds normal. Right breast exhibits no inverted nipple, no mass, no nipple discharge, no skin change and no tenderness. Left breast exhibits no inverted nipple, no mass, no nipple discharge, no skin change and no tenderness.  Abdominal: Soft. Bowel sounds are normal. She exhibits no mass. There is no tenderness.  Genitourinary: Vagina normal and uterus normal. No breast swelling, tenderness, discharge or bleeding.  bimanual exam-No adnexal masses or tenderness. Cervix nonparous and pink  Musculoskeletal: Normal range of motion.  Lymphadenopathy:    She has no  cervical adenopathy.  Neurological: She is alert and oriented to person, place, and time.  Skin: Skin is warm and dry.  Psychiatric: She has a normal mood and affect. Her behavior is normal. Judgment and thought content normal.   .BP 142/45  Pulse 57  Temp(Src) 97.6 F (36.4 C) (Oral)  Ht 5\' 2"  (1.575 m)  Wt 141 lb 8 oz (64.184 kg)  BMI 25.87 kg/m2        Assessment & Plan:   1. Encounter for routine gynecological examination   hemoccult cards given to patient with directions Keep follow up appointment with Dr.Moore and clinical pharmacist for INR  Mary-Margaret Hassell Done, FNP

## 2014-09-22 NOTE — Patient Instructions (Signed)

## 2014-09-23 LAB — PAP IG (IMAGE GUIDED): PAP Smear Comment: 0

## 2014-09-24 ENCOUNTER — Telehealth: Payer: Self-pay | Admitting: *Deleted

## 2014-09-24 NOTE — Telephone Encounter (Signed)
Aware. 

## 2014-09-24 NOTE — Telephone Encounter (Signed)
Message copied by Shelbie Ammons on Thu Sep 24, 2014  9:48 AM ------      Message from: Chevis Pretty      Created: Thu Sep 24, 2014  8:59 AM       Pap normal- repeat in 2 years       ------

## 2014-10-05 ENCOUNTER — Ambulatory Visit (INDEPENDENT_AMBULATORY_CARE_PROVIDER_SITE_OTHER): Payer: Medicare Other | Admitting: Pharmacist

## 2014-10-05 DIAGNOSIS — I82409 Acute embolism and thrombosis of unspecified deep veins of unspecified lower extremity: Secondary | ICD-10-CM

## 2014-10-05 LAB — POCT INR: INR: 2.6

## 2014-10-05 NOTE — Patient Instructions (Signed)
Anticoagulation Dose Instructions as of 10/05/2014      [name] Tue Wed Thu Fri Sat   New Dose 3 mg 2 mg 3 mg 3 mg 3 mg 2 mg 3 mg    Description        Continue current warfarin dose of 1 tablet on Mondays and Fridays and  1 and 1/2 tablet  all other days.       INR was 2.6 today

## 2014-10-15 ENCOUNTER — Telehealth: Payer: Self-pay | Admitting: Family Medicine

## 2014-10-15 ENCOUNTER — Encounter (HOSPITAL_COMMUNITY): Payer: Self-pay | Admitting: Emergency Medicine

## 2014-10-15 ENCOUNTER — Emergency Department (HOSPITAL_COMMUNITY)
Admission: EM | Admit: 2014-10-15 | Discharge: 2014-10-15 | Disposition: A | Discharge status: Home / Self Care | Payer: Medicare Other | Attending: Emergency Medicine | Admitting: Emergency Medicine

## 2014-10-15 DIAGNOSIS — Z7901 Long term (current) use of anticoagulants: Secondary | ICD-10-CM | POA: Diagnosis not present

## 2014-10-15 DIAGNOSIS — Z8639 Personal history of other endocrine, nutritional and metabolic disease: Secondary | ICD-10-CM | POA: Insufficient documentation

## 2014-10-15 DIAGNOSIS — R531 Weakness: Secondary | ICD-10-CM | POA: Diagnosis not present

## 2014-10-15 DIAGNOSIS — N184 Chronic kidney disease, stage 4 (severe): Secondary | ICD-10-CM | POA: Insufficient documentation

## 2014-10-15 DIAGNOSIS — M109 Gout, unspecified: Secondary | ICD-10-CM | POA: Diagnosis not present

## 2014-10-15 DIAGNOSIS — Z79899 Other long term (current) drug therapy: Secondary | ICD-10-CM | POA: Insufficient documentation

## 2014-10-15 DIAGNOSIS — Z86718 Personal history of other venous thrombosis and embolism: Secondary | ICD-10-CM | POA: Insufficient documentation

## 2014-10-15 DIAGNOSIS — I129 Hypertensive chronic kidney disease with stage 1 through stage 4 chronic kidney disease, or unspecified chronic kidney disease: Secondary | ICD-10-CM | POA: Diagnosis not present

## 2014-10-15 DIAGNOSIS — H409 Unspecified glaucoma: Secondary | ICD-10-CM | POA: Diagnosis not present

## 2014-10-15 DIAGNOSIS — R1110 Vomiting, unspecified: Secondary | ICD-10-CM | POA: Diagnosis not present

## 2014-10-15 DIAGNOSIS — Z880 Allergy status to penicillin: Secondary | ICD-10-CM | POA: Insufficient documentation

## 2014-10-15 DIAGNOSIS — R197 Diarrhea, unspecified: Secondary | ICD-10-CM | POA: Diagnosis present

## 2014-10-15 DIAGNOSIS — K219 Gastro-esophageal reflux disease without esophagitis: Secondary | ICD-10-CM | POA: Diagnosis not present

## 2014-10-15 LAB — URINE MICROSCOPIC-ADD ON

## 2014-10-15 LAB — CBC
HEMATOCRIT: 34.9 % — AB (ref 36.0–46.0)
Hemoglobin: 11.7 g/dL — ABNORMAL LOW (ref 12.0–15.0)
MCH: 30.7 pg (ref 26.0–34.0)
MCHC: 33.5 g/dL (ref 30.0–36.0)
MCV: 91.6 fL (ref 78.0–100.0)
Platelets: 196 10*3/uL (ref 150–400)
RBC: 3.81 MIL/uL — AB (ref 3.87–5.11)
RDW: 13.2 % (ref 11.5–15.5)
WBC: 4.7 10*3/uL (ref 4.0–10.5)

## 2014-10-15 LAB — URINALYSIS, ROUTINE W REFLEX MICROSCOPIC
BILIRUBIN URINE: NEGATIVE
Glucose, UA: NEGATIVE mg/dL
Ketones, ur: NEGATIVE mg/dL
LEUKOCYTES UA: NEGATIVE
Nitrite: NEGATIVE
PH: 5 (ref 5.0–8.0)
Protein, ur: NEGATIVE mg/dL
SPECIFIC GRAVITY, URINE: 1.013 (ref 1.005–1.030)
Urobilinogen, UA: 0.2 mg/dL (ref 0.0–1.0)

## 2014-10-15 LAB — COMPREHENSIVE METABOLIC PANEL
ALT: 12 U/L (ref 0–35)
AST: 22 U/L (ref 0–37)
Albumin: 3.4 g/dL — ABNORMAL LOW (ref 3.5–5.2)
Alkaline Phosphatase: 61 U/L (ref 39–117)
Anion gap: 16 — ABNORMAL HIGH (ref 5–15)
BILIRUBIN TOTAL: 0.3 mg/dL (ref 0.3–1.2)
BUN: 39 mg/dL — ABNORMAL HIGH (ref 6–23)
CHLORIDE: 108 meq/L (ref 96–112)
CO2: 19 meq/L (ref 19–32)
Calcium: 9.8 mg/dL (ref 8.4–10.5)
Creatinine, Ser: 2.12 mg/dL — ABNORMAL HIGH (ref 0.50–1.10)
GFR, EST AFRICAN AMERICAN: 23 mL/min — AB (ref >90)
GFR, EST NON AFRICAN AMERICAN: 20 mL/min — AB (ref >90)
Glucose, Bld: 112 mg/dL — ABNORMAL HIGH (ref 70–99)
Potassium: 3.9 mEq/L (ref 3.7–5.3)
Sodium: 143 mEq/L (ref 137–147)
Total Protein: 6.9 g/dL (ref 6.0–8.3)

## 2014-10-15 LAB — LIPASE, BLOOD: LIPASE: 40 U/L (ref 11–59)

## 2014-10-15 LAB — I-STAT CG4 LACTIC ACID, ED: LACTIC ACID, VENOUS: 1.07 mmol/L (ref 0.5–2.2)

## 2014-10-15 MED ORDER — SODIUM CHLORIDE 0.9 % IV BOLUS (SEPSIS)
1000.0000 mL | Freq: Once | INTRAVENOUS | Status: AC
  Administered 2014-10-15: 1000 mL via INTRAVENOUS

## 2014-10-15 NOTE — ED Notes (Signed)
Pt reports abdominal pain nausea vomiting and diarrhea since eating eating salad at church on Sunday and has been sick ever since. She says over 30 people from her church have been sick as well. Pt advised by health department to come to the ED. "up all last night with diarrhea 4 or 5 times."

## 2014-10-15 NOTE — ED Provider Notes (Signed)
CSN: [id number]     Arrival date & time 10/15/14  [id number] History   First MD Initiated Contact with Patient 10/15/14 1048     No chief complaint on file.    (Consider location/radiation/quality/duration/timing/severity/associated sxs/prior Treatment) Patient is a 80 y.o. female presenting with diarrhea. The history is provided by the patient.  Diarrhea Quality:  Watery and semi-solid Severity:  Severe Onset quality:  Gradual Number of episodes:  Numerous Duration:  5 days Timing:  Constant Progression:  Unchanged Relieved by:  Nothing Worsened by:  Nothing tried Associated symptoms: vomiting (3 days ago, a few episodes)   Associated symptoms: no chills, no recent cough, no fever and no URI   Risk factors: suspect food intake (30 people at her church are sick with similar problems)   Risk factors: no recent antibiotic use and no sick contacts     Past Medical History  Diagnosis Date  . HTN (hypertension)   . Fibrocystic breast disease   . Palpitations   . Glaucoma   . Multiple thyroid nodules   . Stasis dermatitis   . Gout   . DVT (deep venous thrombosis) 1950  . Arthritis     Gout  . Peripheral vascular disease   . Chronic kidney disease, stage IV (severe)   . Diverticulosis of colon (without mention of hemorrhage) 2008    Dr. Patterson(Colonoscopy)  . Hiatal hernia 2004    Dr. Arther Dames)  . GERD (gastroesophageal reflux disease) 2004    Dr. Arther Dames)  . External hemorrhoids without mention of complication [id number]    Dr. Patterson(Colonoscopy)   Past Surgical History  Procedure Laterality Date  . Tonsillectomy    . Cesarean section    . Cystectomy      on colon  . Appendectomy     Family History  Problem Relation Age of Onset  . Cancer Mother     pt Francene Boyers of the origin  . Breast cancer Sister     x 3  . Heart disease Brother     x 2   History  Substance Use Topics  . Smoking status: Never Smoker   . Smokeless tobacco: Never Used  . Alcohol  Use: No   OB History    No data available     Review of Systems  Constitutional: Negative for fever and chills.  Respiratory: Negative for cough and shortness of breath.   Gastrointestinal: Positive for vomiting (3 days ago, a few episodes) and diarrhea.  Neurological: Positive for weakness.  All other systems reviewed and are negative.     Allergies  Prednisone; Ace inhibitors; Allopurinol; Alendronate sodium; Ciprofloxacin; Clarithromycin; Clindamycin/lincomycin; Nitrofurantoin; Ofloxacin; Penicillins; Sulfamethoxazole; and Sulfonamide derivatives  Home Medications   Prior to Admission medications   Medication Sig Start Date End Date Taking? Authorizing Provider  amLODipine (NORVASC) 5 MG tablet TAKE ONE TABLET BY MOUTH EVERY DAY 03/27/14   Larey Dresser, MD  calcitRIOL (ROCALTROL) 0.25 MCG capsule Take 0.25 mcg by mouth daily. 04/30/13   Historical Provider, MD  COLCRYS 0.6 MG tablet Take 1 tablet by mouth daily. 08/18/13   Historical Provider, MD  dorzolamide-timolol (COSOPT) 22.3-6.8 MG/ML ophthalmic solution  10/27/13   Historical Provider, MD  febuxostat (ULORIC) 40 MG tablet Take 1 tablet (40 mg total) by mouth daily. 04/03/13   Chipper Herb, MD  furosemide (LASIX) 40 MG tablet Take 40 mg by mouth daily.  10/05/11   Historical Provider, MD  latanoprost (XALATAN) 0.005 % ophthalmic solution Place 1 drop into  both eyes at bedtime.     Historical Provider, MD  losartan (COZAAR) 100 MG tablet TAKE ONE TABLET BY MOUTH ONCE DAILY 09/01/14   Chipper Herb, MD  metoprolol succinate (TOPROL-XL) 25 MG 24 hr tablet TAKE ONE TABLET BY MOUTH ONCE DAILY 09/17/14   Larey Dresser, MD  Omega-3 Fatty Acids (FISH OIL) 1000 MG CAPS Take 1,000 mg by mouth daily.    Historical Provider, MD  pantoprazole (PROTONIX) 40 MG tablet TAKE ONE TABLET BY MOUTH ONCE DAILY 09/17/14   Chipper Herb, MD  warfarin (COUMADIN) 2 MG tablet Take 1 and 1/2 tablets by mouth once daily or as directed by  anticoagulation clinic 04/22/14   Tammy Eckard, PHARMD   BP 130/52 mmHg  Pulse 66  Temp(Src) 97.7 F (36.5 C) (Oral)  Resp 16  Ht 5\' 3"  (1.6 m)  Wt 136 lb (61.689 kg)  BMI 24.10 kg/m2  SpO2 99% Physical Exam  Constitutional: She is oriented to person, place, and time. She appears well-developed and well-nourished. No distress.  HENT:  Head: Normocephalic and atraumatic.  Mouth/Throat: Oropharynx is clear and moist.  Eyes: EOM are normal. Pupils are equal, round, and reactive to light.  Neck: Normal range of motion. Neck supple.  Cardiovascular: Normal rate and regular rhythm.  Exam reveals no friction rub.   No murmur heard. Pulmonary/Chest: Effort normal and breath sounds normal. No respiratory distress. She has no wheezes. She has no rales.  Abdominal: Soft. She exhibits no distension. There is no tenderness. There is no rebound.  Musculoskeletal: Normal range of motion. She exhibits no edema.  Neurological: She is alert and oriented to person, place, and time. No cranial nerve deficit. She exhibits normal muscle tone. Coordination normal.  Skin: No rash noted. She is not diaphoretic.  Nursing note and vitals reviewed.   ED Course  Procedures (including critical care time) Labs Review Labs Reviewed - No data to display  Imaging Review No results found.   EKG Interpretation None      MDM   Final diagnoses:  Diarrhea    45F here with diarrhea. Vomiting 3 days ago. No fevers. Similar sickness in about 30 people from her church. Diarrhea began about 5 days ago. No syncope or dizziness, just weakness. AFVSS here. MMM, belly benign. Will check labs, concern for dehydration.  Labs ok. Feeling well after fluids. Would like to go home. I feel this is reasonable, can f/u with PCP if needed. Stable for discharge. Ambulating easily without assistance.  Evelina Bucy, MD 10/15/14 [phone]

## 2014-10-15 NOTE — Telephone Encounter (Signed)
Appt given for tomorrow per patient request

## 2014-10-15 NOTE — Discharge Instructions (Signed)

## 2014-10-15 NOTE — ED Notes (Signed)
IV removed, discharge instructions explained. Pt states no other questions or needs, husband arrived to transport pt home.

## 2014-10-16 ENCOUNTER — Encounter: Payer: Self-pay | Admitting: Family Medicine

## 2014-10-16 ENCOUNTER — Ambulatory Visit (INDEPENDENT_AMBULATORY_CARE_PROVIDER_SITE_OTHER): Payer: Medicare Other | Admitting: Family Medicine

## 2014-10-16 VITALS — BP 131/58 | HR 57 | Temp 97.7°F | Ht 63.0 in | Wt 138.0 lb

## 2014-10-16 DIAGNOSIS — N184 Chronic kidney disease, stage 4 (severe): Secondary | ICD-10-CM

## 2014-10-16 DIAGNOSIS — R5383 Other fatigue: Secondary | ICD-10-CM

## 2014-10-16 DIAGNOSIS — R197 Diarrhea, unspecified: Secondary | ICD-10-CM

## 2014-10-16 DIAGNOSIS — R5381 Other malaise: Secondary | ICD-10-CM

## 2014-10-16 DIAGNOSIS — A059 Bacterial foodborne intoxication, unspecified: Secondary | ICD-10-CM

## 2014-10-16 DIAGNOSIS — A084 Viral intestinal infection, unspecified: Secondary | ICD-10-CM

## 2014-10-16 LAB — POCT CBC
Granulocyte percent: 58.2 %G (ref 37–80)
HCT, POC: 34.6 % — AB (ref 37.7–47.9)
HEMOGLOBIN: 11.3 g/dL — AB (ref 12.2–16.2)
LYMPH, POC: 1.6 (ref 0.6–3.4)
MCH, POC: 30.3 pg (ref 27–31.2)
MCHC: 32.8 g/dL (ref 31.8–35.4)
MCV: 92.5 fL (ref 80–97)
MPV: 6.6 fL (ref 0–99.8)
POC Granulocyte: 2.6 (ref 2–6.9)
POC LYMPH PERCENT: 34.9 %L (ref 10–50)
Platelet Count, POC: 232 10*3/uL (ref 142–424)
RBC: 3.7 M/uL — AB (ref 4.04–5.48)
RDW, POC: 13.4 %
WBC: 4.5 10*3/uL — AB (ref 4.6–10.2)

## 2014-10-16 NOTE — Progress Notes (Signed)
Subjective:    Patient ID: Elizabeth Burch, female    DOB: 09-May-1926, 80 y.o.   MRN: [id number]  HPI Patient here today for follow up on visit to [facility] where she was seen for diarrhea and vomiting. The patient thinks that she got food poisoning from a church dinner as many other people had the same symptoms. She is feeling better she is not having anymore symptoms other than weakness. She has a history of chronic kidney disease and elevated creatinine. It lasted until yesterday morning. The patient is complains of weakness mostly.        Patient Active Problem List   Diagnosis Date Noted  . Osteoporosis 12/24/2013  . Kyphosis 11/03/2013  . Acute venous embolism and thrombosis of deep vessels of distal lower extremity 08/26/2013  . 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 Fibrocystic disease of right breast 05/22/2013  . DVT (deep venous thrombosis) 02/16/2013  . Leg edema, left 09/16/2012  . Gout   . CKD (chronic kidney disease) 01/08/2012  . Multinodular thyroid 10/25/2011  . Fibrocystic breast disease, left.   . Open-angle glaucoma   . HYPERLIPIDEMIA 01/06/2009  . HYPERTENSION 01/06/2009   Outpatient Encounter Prescriptions as of 10/16/2014  Medication Sig  . amLODipine (NORVASC) 5 MG tablet TAKE ONE TABLET BY MOUTH EVERY DAY  . calcitRIOL (ROCALTROL) 0.25 MCG capsule Take 0.25 mcg by mouth daily.  Marland Kitchen COLCRYS 0.6 MG tablet Take 1 tablet by mouth daily.  . dorzolamide-timolol (COSOPT) 22.3-6.8 MG/ML ophthalmic solution   . febuxostat (ULORIC) 40 MG tablet Take 1 tablet (40 mg total) by mouth daily.  . furosemide (LASIX) 40 MG tablet Take 40 mg by mouth daily.   Marland Kitchen latanoprost (XALATAN) 0.005 % ophthalmic solution Place 1 drop into both eyes at bedtime.   Marland Kitchen losartan (COZAAR) 100 MG tablet TAKE ONE TABLET BY MOUTH ONCE DAILY  . metoprolol succinate (TOPROL-XL) 25 MG 24 hr tablet TAKE ONE TABLET BY MOUTH ONCE DAILY  . Omega-3 Fatty Acids (FISH OIL) 1000 MG CAPS  Take 1,000 mg by mouth daily.  . pantoprazole (PROTONIX) 40 MG tablet TAKE ONE TABLET BY MOUTH ONCE DAILY  . warfarin (COUMADIN) 2 MG tablet Take 1 and 1/2 tablets by mouth once daily or as directed by anticoagulation clinic    Review of Systems  HENT: Negative.   Eyes: Negative.   Respiratory: Negative.   Cardiovascular: Negative.   Gastrointestinal: Negative.  Negative for vomiting and diarrhea.  Endocrine: Negative.   Genitourinary: Negative.   Musculoskeletal: Negative.   Skin: Negative.   Allergic/Immunologic: Negative.   Neurological: Positive for weakness.  Hematological: Negative.   Psychiatric/Behavioral: Negative.        Objective:   Physical Exam  Constitutional: She is oriented to person, place, and time. She appears well-developed and well-nourished. No distress.  HENT:  Head: Normocephalic.  Eyes: Conjunctivae and EOM are normal. Pupils are equal, round, and reactive to light. Right eye exhibits no discharge. Left eye exhibits no discharge. No scleral icterus.  Neck: Normal range of motion. Neck supple. No thyromegaly present.  Cardiovascular: Normal rate, regular rhythm and normal heart sounds.   No murmur heard. The rhythm is regular at 72/m  Pulmonary/Chest: Effort normal and breath sounds normal. No respiratory distress. She has no wheezes. She has no rales. She exhibits no tenderness.  Abdominal: Soft. Bowel sounds are normal. She exhibits distension. She exhibits no mass. There is no tenderness. There is no rebound and no guarding.  Musculoskeletal:  Normal range of motion. She exhibits no edema.  Lymphadenopathy:    She has no cervical adenopathy.  Neurological: She is alert and oriented to person, place, and time. She has normal reflexes. No cranial nerve deficit.  Skin: Skin is warm and dry. Rash noted.  Psychiatric: She has a normal mood and affect. Her behavior is normal. Judgment and thought content normal.  Nursing note and vitals reviewed.  BP  131/58 mmHg  Pulse 57  Temp(Src) 97.7 F (36.5 C) (Oral)  Ht $R'5\' 3"'rv$  (1.6 m)  Wt 138 lb (62.596 kg)  BMI 24.45 kg/m2        Assessment & Plan:  1. Diarrhea - POCT CBC - BMP8+EGFR  2. Chronic kidney disease (CKD), stage IV (severe)  3. Malaise and fatigue  4. Viral gastroenteritis  5. Food poisoning   Patient Instructions  Continue with bland diet and lots of fluids without caffeine milk cheese or dairy products. Rest If symptoms come back please give [place] a call immediately   Arrie Senate MD

## 2014-10-16 NOTE — Patient Instructions (Signed)
Continue with bland diet and lots of fluids without caffeine milk cheese or dairy products. Rest If symptoms come back please give [place] a call immediately

## 2014-10-17 LAB — BMP8+EGFR
BUN/Creatinine Ratio: 13 (ref 11–26)
BUN: 30 mg/dL — ABNORMAL HIGH (ref 8–27)
CALCIUM: 9.4 mg/dL (ref 8.7–10.3)
CO2: 21 mmol/L (ref 18–29)
Chloride: 108 mmol/L (ref 97–108)
Creatinine, Ser: 2.26 mg/dL — ABNORMAL HIGH (ref 0.57–1.00)
GFR calc Af Amer: 22 mL/min/{1.73_m2} — ABNORMAL LOW (ref >59)
GFR calc non Af Amer: 19 mL/min/{1.73_m2} — ABNORMAL LOW (ref >59)
Glucose: 91 mg/dL (ref 65–99)
Potassium: 3.3 mmol/L — ABNORMAL LOW (ref 3.5–5.2)
Sodium: 143 mmol/L (ref 134–144)

## 2014-11-03 ENCOUNTER — Other Ambulatory Visit (INDEPENDENT_AMBULATORY_CARE_PROVIDER_SITE_OTHER): Payer: Medicare Other

## 2014-11-03 DIAGNOSIS — R5381 Other malaise: Secondary | ICD-10-CM

## 2014-11-03 DIAGNOSIS — Z79899 Other long term (current) drug therapy: Secondary | ICD-10-CM

## 2014-11-03 DIAGNOSIS — I10 Essential (primary) hypertension: Secondary | ICD-10-CM

## 2014-11-03 DIAGNOSIS — M1000 Idiopathic gout, unspecified site: Secondary | ICD-10-CM

## 2014-11-03 DIAGNOSIS — R7989 Other specified abnormal findings of blood chemistry: Secondary | ICD-10-CM

## 2014-11-03 DIAGNOSIS — R748 Abnormal levels of other serum enzymes: Secondary | ICD-10-CM

## 2014-11-03 DIAGNOSIS — R0602 Shortness of breath: Secondary | ICD-10-CM

## 2014-11-03 DIAGNOSIS — E785 Hyperlipidemia, unspecified: Secondary | ICD-10-CM

## 2014-11-03 DIAGNOSIS — E559 Vitamin D deficiency, unspecified: Secondary | ICD-10-CM

## 2014-11-03 DIAGNOSIS — R5383 Other fatigue: Secondary | ICD-10-CM

## 2014-11-03 LAB — POCT CBC
GRANULOCYTE PERCENT: 69.5 % (ref 37–80)
HEMATOCRIT: 35 % — AB (ref 37.7–47.9)
Hemoglobin: 11.5 g/dL — AB (ref 12.2–16.2)
Lymph, poc: 1.8 (ref 0.6–3.4)
MCH, POC: 30.8 pg (ref 27–31.2)
MCHC: 32.9 g/dL (ref 31.8–35.4)
MCV: 93.5 fL (ref 80–97)
MPV: 8.2 fL (ref 0–99.8)
POC Granulocyte: 5.4 (ref 2–6.9)
POC LYMPH PERCENT: 23.9 %L (ref 10–50)
Platelet Count, POC: 266 10*3/uL (ref 142–424)
RBC: 3.7 M/uL — AB (ref 4.04–5.48)
RDW, POC: 13.4 %
WBC: 7.7 10*3/uL (ref 4.6–10.2)

## 2014-11-04 LAB — NMR, LIPOPROFILE
CHOLESTEROL: 230 mg/dL — AB (ref 100–199)
HDL Cholesterol by NMR: 92 mg/dL (ref >39)
HDL PARTICLE NUMBER: 47.6 umol/L (ref >=30.5)
LDL Particle Number: 1360 nmol/L — ABNORMAL HIGH (ref <1000)
LDL SIZE: 21.1 nm (ref >20.5)
LDL-C: 118 mg/dL — ABNORMAL HIGH (ref 0–99)
LP-IR Score: <25 (ref <=45)
Small LDL Particle Number: 455 nmol/L (ref <=527)
TRIGLYCERIDES BY NMR: 100 mg/dL (ref 0–149)

## 2014-11-04 LAB — BMP8+EGFR
BUN/Creatinine Ratio: 17 (ref 11–26)
BUN: 36 mg/dL — AB (ref 8–27)
CALCIUM: 10.1 mg/dL (ref 8.7–10.3)
CO2: 27 mmol/L (ref 18–29)
CREATININE: 2.18 mg/dL — AB (ref 0.57–1.00)
Chloride: 104 mmol/L (ref 97–108)
GFR, EST AFRICAN AMERICAN: 23 mL/min/{1.73_m2} — AB (ref >59)
GFR, EST NON AFRICAN AMERICAN: 20 mL/min/{1.73_m2} — AB (ref >59)
GLUCOSE: 91 mg/dL (ref 65–99)
Potassium: 4.1 mmol/L (ref 3.5–5.2)
Sodium: 146 mmol/L — ABNORMAL HIGH (ref 134–144)

## 2014-11-04 LAB — HEPATIC FUNCTION PANEL
ALBUMIN: 4.4 g/dL (ref 3.5–4.7)
ALT: 9 IU/L (ref 0–32)
AST: 19 IU/L (ref 0–40)
Alkaline Phosphatase: 78 IU/L (ref 39–117)
BILIRUBIN DIRECT: 0.1 mg/dL (ref 0.00–0.40)
BILIRUBIN TOTAL: 0.4 mg/dL (ref 0.0–1.2)
TOTAL PROTEIN: 6.2 g/dL (ref 6.0–8.5)

## 2014-11-04 LAB — VITAMIN D 25 HYDROXY (VIT D DEFICIENCY, FRACTURES): Vit D, 25-Hydroxy: 25.8 ng/mL — ABNORMAL LOW (ref 30.0–100.0)

## 2014-11-10 ENCOUNTER — Encounter: Payer: Self-pay | Admitting: Family Medicine

## 2014-11-10 ENCOUNTER — Ambulatory Visit (INDEPENDENT_AMBULATORY_CARE_PROVIDER_SITE_OTHER): Payer: Medicare Other | Admitting: Family Medicine

## 2014-11-10 VITALS — BP 158/62 | HR 58 | Temp 97.7°F | Ht 63.0 in | Wt 143.0 lb

## 2014-11-10 DIAGNOSIS — I10 Essential (primary) hypertension: Secondary | ICD-10-CM

## 2014-11-10 DIAGNOSIS — N184 Chronic kidney disease, stage 4 (severe): Secondary | ICD-10-CM

## 2014-11-10 DIAGNOSIS — E785 Hyperlipidemia, unspecified: Secondary | ICD-10-CM

## 2014-11-10 DIAGNOSIS — E559 Vitamin D deficiency, unspecified: Secondary | ICD-10-CM

## 2014-11-10 DIAGNOSIS — I82403 Acute embolism and thrombosis of unspecified deep veins of lower extremity, bilateral: Secondary | ICD-10-CM

## 2014-11-10 LAB — POCT INR: INR: 2.6

## 2014-11-10 MED ORDER — WARFARIN SODIUM 2 MG PO TABS: ORAL_TABLET | ORAL | Status: DC

## 2014-11-10 NOTE — Addendum Note (Signed)
Addended by: Zannie Cove on: 11/10/2014 10:04 AM   Modules accepted: Orders

## 2014-11-10 NOTE — Patient Instructions (Addendum)
Medicare Annual Wellness Visit  [facility] and the medical providers at [facility] strive to bring you the best medical care.  In doing so we not only want to address your current medical conditions and concerns but also to detect new conditions early and prevent illness, disease and health-related problems.    Medicare offers a yearly Wellness Visit which allows our clinical staff to assess your need for preventative services including immunizations, lifestyle education, counseling to decrease risk of preventable diseases and screening for fall risk and other medical concerns.    This visit is provided free of charge (no copay) for all Medicare recipients. The clinical pharmacists at [facility] have begun to conduct these Wellness Visits which will also include a thorough review of all your medications.    As you primary medical provider recommend that you make an appointment for your Annual Wellness Visit if you have not done so already this year.  You may set up this appointment before you leave today or you may call back [phone] and schedule an appointment.  Please make sure when you call that you mention that you are scheduling your Annual Wellness Visit with the clinical pharmacist so that the appointment may be made for the proper length of time.     Continue current medications. Continue good therapeutic lifestyle changes which include good diet and exercise. Fall precautions discussed with patient. If an FOBT was given today- please return it to our front desk. If you are over 65 years old - you may need Prevnar 27 or the adult Pneumonia vaccine.  Flu Shots will be available at our office starting mid- September. Please call and schedule a FLU CLINIC APPOINTMENT.   Keep follow-up appointments with cardiology and nephrology     Anticoagulation Dose Instructions as of 11/10/2014      [name] Tue Wed Thu Fri Sat    New Dose 3 mg 2 mg 3 mg 3 mg 3 mg 2 mg 3 mg    Description        Continue current warfarin dose of 1 tablet on Mondays and Fridays and  1 and 1/2 tablet  all other days.

## 2014-11-10 NOTE — Progress Notes (Signed)
Subjective:    Patient ID: Elizabeth Burch, female    DOB: March 26, 1926, 80 y.o.   MRN: [id number]  HPI Pt here for follow up and management of chronic medical problems. This patient is doing well and his usual has no complaints. She is followed regularly by the cardiologist and the nephrologist. Her recent lab work will be reviewed with her today. Her creatinine is stable and the lower 2 range. Her blood sugar was good. Her hemoglobin is stable 11.5. Liver function tests were within normal limits. The total LDL particle number remains slightly elevated but she is intolerant to statin drugs. The vitamin D level remains low.         Patient Active Problem List   Diagnosis Date Noted  . Osteoporosis 12/24/2013  . Kyphosis 11/03/2013  . Acute venous embolism and thrombosis of deep vessels of distal lower extremity 08/26/2013  . 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 Fibrocystic disease of right breast 05/22/2013  . DVT (deep venous thrombosis) 02/16/2013  . Leg edema, left 09/16/2012  . Gout   . CKD (chronic kidney disease) 01/08/2012  . Multinodular thyroid 10/25/2011  . Fibrocystic breast disease, left.   . Open-angle glaucoma   . HYPERLIPIDEMIA 01/06/2009  . HYPERTENSION 01/06/2009   Outpatient Encounter Prescriptions as of 11/10/2014  Medication Sig  . amLODipine (NORVASC) 5 MG tablet TAKE ONE TABLET BY MOUTH EVERY DAY  . calcitRIOL (ROCALTROL) 0.25 MCG capsule Take 0.25 mcg by mouth daily.  Marland Kitchen COLCRYS 0.6 MG tablet Take 1 tablet by mouth daily.  . dorzolamide-timolol (COSOPT) 22.3-6.8 MG/ML ophthalmic solution   . febuxostat (ULORIC) 40 MG tablet Take 1 tablet (40 mg total) by mouth daily.  . furosemide (LASIX) 40 MG tablet Take 40 mg by mouth daily.   Marland Kitchen latanoprost (XALATAN) 0.005 % ophthalmic solution Place 1 drop into both eyes at bedtime.   Marland Kitchen losartan (COZAAR) 100 MG tablet TAKE ONE TABLET BY MOUTH ONCE DAILY  . metoprolol succinate (TOPROL-XL) 25 MG 24 hr  tablet TAKE ONE TABLET BY MOUTH ONCE DAILY  . Omega-3 Fatty Acids (FISH OIL) 1000 MG CAPS Take 1,000 mg by mouth daily.  . pantoprazole (PROTONIX) 40 MG tablet TAKE ONE TABLET BY MOUTH ONCE DAILY  . warfarin (COUMADIN) 2 MG tablet Take 1 and 1/2 tablets by mouth once daily or as directed by anticoagulation clinic    Review of Systems  Constitutional: Negative.   HENT: Negative.   Eyes: Negative.   Respiratory: Negative.   Cardiovascular: Negative.   Gastrointestinal: Negative.   Endocrine: Negative.   Genitourinary: Negative.   Musculoskeletal: Negative.   Skin: Negative.   Allergic/Immunologic: Negative.   Neurological: Negative.   Hematological: Negative.   Psychiatric/Behavioral: Negative.        Objective:   Physical Exam  Constitutional: She is oriented to person, place, and time. She appears well-developed and well-nourished. No distress.  The patient is pleasant, alert, and cooperative.  HENT:  Head: Normocephalic and atraumatic.  Right Ear: External ear normal.  Left Ear: External ear normal.  Nose: Nose normal.  Mouth/Throat: Oropharynx is clear and moist. No oropharyngeal exudate.  Eyes: Conjunctivae and EOM are normal. Pupils are equal, round, and reactive to light. Right eye exhibits no discharge. Left eye exhibits no discharge. No scleral icterus.  Neck: Normal range of motion. Neck supple. No thyromegaly present.  No carotid bruits  Cardiovascular: Normal rate, regular rhythm, normal heart sounds and intact distal pulses.   No murmur  heard. At 72/m  Pulmonary/Chest: Effort normal and breath sounds normal. No respiratory distress. She has no wheezes. She has no rales. She exhibits no tenderness.  No axillary adenopathy  Abdominal: Soft. Bowel sounds are normal. She exhibits no mass. There is no tenderness. There is no rebound and no guarding.  No inguinal adenopathy  Musculoskeletal: Normal range of motion. She exhibits no edema.  Lymphadenopathy:    She has  no cervical adenopathy.  Neurological: She is alert and oriented to person, place, and time. She has normal reflexes. Coordination normal.  Skin: Skin is warm and dry. No rash noted.  Skin on back is clear of any worrisome lesions  Psychiatric: She has a normal mood and affect. Her behavior is normal. Judgment and thought content normal.  Nursing note and vitals reviewed.  BP 158/62 mmHg  Pulse 58  Temp(Src) 97.7 F (36.5 C) (Oral)  Ht 5\' 3"  (1.6 m)  Wt 143 lb (64.864 kg)  BMI 25.34 kg/m2        Assessment & Plan:  1. Chronic kidney disease (CKD), stage IV (severe)  2. Essential hypertension  3. Hyperlipemia  4. Vitamin D deficiency  5. DVT (deep venous thrombosis) - POCT INR  No orders of the defined types were placed in this encounter.   Coumadin will be refilled today  Patient Instructions                       Medicare Annual Wellness Visit  [facility] and the medical providers at [facility] strive to bring you the best medical care.  In doing so we not only want to address your current medical conditions and concerns but also to detect new conditions early and prevent illness, disease and health-related problems.    Medicare offers a yearly Wellness Visit which allows our clinical staff to assess your need for preventative services including immunizations, lifestyle education, counseling to decrease risk of preventable diseases and screening for fall risk and other medical concerns.    This visit is provided free of charge (no copay) for all Medicare recipients. The clinical pharmacists at [facility] have begun to conduct these Wellness Visits which will also include a thorough review of all your medications.    As you primary medical provider recommend that you make an appointment for your Annual Wellness Visit if you have not done so already this year.  You may set up this appointment before you leave today or you may  call back [phone] and schedule an appointment.  Please make sure when you call that you mention that you are scheduling your Annual Wellness Visit with the clinical pharmacist so that the appointment may be made for the proper length of time.     Continue current medications. Continue good therapeutic lifestyle changes which include good diet and exercise. Fall precautions discussed with patient. If an FOBT was given today- please return it to our front desk. If you are over 49 years old - you may need Prevnar 77 or the adult Pneumonia vaccine.  Flu Shots will be available at our office starting mid- September. Please call and schedule a FLU CLINIC APPOINTMENT.   Keep follow-up appointments with cardiology and nephrology   Arrie Senate MD

## 2014-12-10 ENCOUNTER — Ambulatory Visit (INDEPENDENT_AMBULATORY_CARE_PROVIDER_SITE_OTHER): Payer: Medicare Other | Admitting: Pharmacist

## 2014-12-10 DIAGNOSIS — I87093 Postthrombotic syndrome with other complications of bilateral lower extremity: Secondary | ICD-10-CM

## 2014-12-10 DIAGNOSIS — I82403 Acute embolism and thrombosis of unspecified deep veins of lower extremity, bilateral: Secondary | ICD-10-CM

## 2014-12-10 LAB — POCT INR: INR: 2.8

## 2014-12-10 NOTE — Patient Instructions (Signed)
Anticoagulation Dose Instructions as of 12/10/2014      [name] Tue Wed Thu Fri Sat   New Dose 3 mg 2 mg 3 mg 3 mg 3 mg 2 mg 3 mg    Description        Continue current warfarin dose of 1 tablet on Mondays and Fridays and  1 and 1/2 tablet  all other days.       INR was 2.8 today

## 2015-01-05 ENCOUNTER — Encounter: Payer: Self-pay | Admitting: *Deleted

## 2015-01-14 ENCOUNTER — Ambulatory Visit (INDEPENDENT_AMBULATORY_CARE_PROVIDER_SITE_OTHER): Payer: Medicare Other | Admitting: Pharmacist

## 2015-01-14 DIAGNOSIS — I82409 Acute embolism and thrombosis of unspecified deep veins of unspecified lower extremity: Secondary | ICD-10-CM

## 2015-01-14 LAB — POCT INR: INR: 2.4

## 2015-01-14 NOTE — Patient Instructions (Signed)
Anticoagulation Dose Instructions as of 01/14/2015      [name] Tue Wed Thu Fri Sat   New Dose 3 mg 2 mg 3 mg 3 mg 3 mg 2 mg 3 mg    Description        Continue current warfarin dose of 1 tablet on Mondays and Fridays and  1 and 1/2 tablet  all other days.       INR was 2.4 today.

## 2015-02-14 ENCOUNTER — Other Ambulatory Visit: Payer: Self-pay | Admitting: Family Medicine

## 2015-02-15 ENCOUNTER — Telehealth: Payer: Self-pay | Admitting: Pharmacist

## 2015-02-15 NOTE — Telephone Encounter (Signed)
Patient also wanted to let me know that her husband had stroke about 1 weeks ago.  She will come in to have INR checked next week.

## 2015-02-18 ENCOUNTER — Ambulatory Visit: Payer: Medicare Other | Admitting: Cardiology

## 2015-02-22 ENCOUNTER — Ambulatory Visit (INDEPENDENT_AMBULATORY_CARE_PROVIDER_SITE_OTHER): Payer: Medicare Other | Admitting: Pharmacist

## 2015-02-22 DIAGNOSIS — I82409 Acute embolism and thrombosis of unspecified deep veins of unspecified lower extremity: Secondary | ICD-10-CM | POA: Diagnosis not present

## 2015-02-22 LAB — POCT INR: INR: 4.2

## 2015-02-22 MED ORDER — COLCHICINE 0.6 MG PO TABS: 0.6000 mg | ORAL_TABLET | Freq: Two times a day (BID) | ORAL | Status: DC | PRN

## 2015-02-22 MED ORDER — WARFARIN SODIUM 2 MG PO TABS: ORAL_TABLET | ORAL | Status: DC

## 2015-02-22 NOTE — Patient Instructions (Signed)
Anticoagulation Dose Instructions as of 02/22/2015      Elizabeth Burch Tue Wed Thu Fri Sat   New Dose 3 mg 2 mg 3 mg 2 mg 3 mg 2 mg 3 mg    Description        No warfarin today 02/22/15 or tomorrow 02/23/15.  Then decrease warfarin dose to 1 tablet mondays, wednesdays and fridays.  Take 1 and 1/2 tablet all other days.      INR was 4.2 today.

## 2015-03-04 ENCOUNTER — Ambulatory Visit (INDEPENDENT_AMBULATORY_CARE_PROVIDER_SITE_OTHER): Payer: Medicare Other | Admitting: Pharmacist

## 2015-03-04 DIAGNOSIS — I82409 Acute embolism and thrombosis of unspecified deep veins of unspecified lower extremity: Secondary | ICD-10-CM

## 2015-03-04 LAB — POCT INR: INR: 2.1

## 2015-03-04 NOTE — Patient Instructions (Signed)
Anticoagulation Dose Instructions as of 03/04/2015      [name] Tue Wed Thu Fri Sat   New Dose 3 mg 2 mg 3 mg 2 mg 3 mg 2 mg 3 mg    Description        Continue current warfarin dose to 1 tablet mondays, wednesdays and fridays.  Take 1 and 1/2 tablet all other days.       INR was 2.1 today

## 2015-03-08 ENCOUNTER — Other Ambulatory Visit: Payer: Self-pay | Admitting: Family Medicine

## 2015-03-09 ENCOUNTER — Other Ambulatory Visit (HOSPITAL_COMMUNITY): Payer: Self-pay | Admitting: Surgery

## 2015-03-09 DIAGNOSIS — Z1231 Encounter for screening mammogram for malignant neoplasm of breast: Secondary | ICD-10-CM

## 2015-03-16 ENCOUNTER — Other Ambulatory Visit (INDEPENDENT_AMBULATORY_CARE_PROVIDER_SITE_OTHER): Payer: Medicare Other

## 2015-03-16 DIAGNOSIS — I10 Essential (primary) hypertension: Secondary | ICD-10-CM

## 2015-03-16 DIAGNOSIS — E785 Hyperlipidemia, unspecified: Secondary | ICD-10-CM

## 2015-03-16 DIAGNOSIS — E559 Vitamin D deficiency, unspecified: Secondary | ICD-10-CM

## 2015-03-16 DIAGNOSIS — N184 Chronic kidney disease, stage 4 (severe): Secondary | ICD-10-CM

## 2015-03-16 DIAGNOSIS — I82401 Acute embolism and thrombosis of unspecified deep veins of right lower extremity: Secondary | ICD-10-CM

## 2015-03-16 DIAGNOSIS — I82409 Acute embolism and thrombosis of unspecified deep veins of unspecified lower extremity: Secondary | ICD-10-CM

## 2015-03-16 LAB — POCT CBC
Granulocyte percent: 62.9 %G (ref 37–80)
HEMATOCRIT: 37.3 % — AB (ref 37.7–47.9)
Hemoglobin: 11.6 g/dL — AB (ref 12.2–16.2)
Lymph, poc: 2.3 (ref 0.6–3.4)
MCH: 28.7 pg (ref 27–31.2)
MCHC: 31 g/dL — AB (ref 31.8–35.4)
MCV: 92.3 fL (ref 80–97)
MPV: 8.4 fL (ref 0–99.8)
POC GRANULOCYTE: 4.7 (ref 2–6.9)
POC LYMPH PERCENT: 30.6 %L (ref 10–50)
Platelet Count, POC: 233 10*3/uL (ref 142–424)
RBC: 4.04 M/uL (ref 4.04–5.48)
RDW, POC: 14.2 %
WBC: 7.5 10*3/uL (ref 4.6–10.2)

## 2015-03-16 LAB — POCT INR: INR: 2.2

## 2015-03-16 NOTE — Addendum Note (Signed)
Addended by: Zannie Cove on: 03/16/2015 10:58 AM   Modules accepted: Orders

## 2015-03-16 NOTE — Progress Notes (Signed)
Lab only / include protime

## 2015-03-17 LAB — NMR, LIPOPROFILE
Cholesterol: 247 mg/dL — ABNORMAL HIGH (ref 100–199)
HDL CHOLESTEROL BY NMR: 95 mg/dL (ref >39)
HDL Particle Number: 45.2 umol/L (ref >=30.5)
LDL PARTICLE NUMBER: 1288 nmol/L — AB (ref <1000)
LDL Size: 21.3 nm (ref >20.5)
LDL-C: 136 mg/dL — AB (ref 0–99)
LP-IR Score: <25 (ref <=45)
Small LDL Particle Number: 213 nmol/L (ref <=527)
Triglycerides by NMR: 81 mg/dL (ref 0–149)

## 2015-03-17 LAB — BMP8+EGFR
BUN/Creatinine Ratio: 25 (ref 11–26)
BUN: 44 mg/dL — ABNORMAL HIGH (ref 8–27)
CO2: 20 mmol/L (ref 18–29)
CREATININE: 1.76 mg/dL — AB (ref 0.57–1.00)
Calcium: 9.9 mg/dL (ref 8.7–10.3)
Chloride: 103 mmol/L (ref 97–108)
GFR calc Af Amer: 30 mL/min/{1.73_m2} — ABNORMAL LOW (ref >59)
GFR calc non Af Amer: 26 mL/min/{1.73_m2} — ABNORMAL LOW (ref >59)
GLUCOSE: 85 mg/dL (ref 65–99)
Potassium: 4.4 mmol/L (ref 3.5–5.2)
SODIUM: 143 mmol/L (ref 134–144)

## 2015-03-17 LAB — HEPATIC FUNCTION PANEL
ALT: 11 IU/L (ref 0–32)
AST: 15 IU/L (ref 0–40)
Albumin: 4.3 g/dL (ref 3.5–4.7)
Alkaline Phosphatase: 84 IU/L (ref 39–117)
Bilirubin Total: 0.4 mg/dL (ref 0.0–1.2)
Bilirubin, Direct: 0.11 mg/dL (ref 0.00–0.40)
TOTAL PROTEIN: 6.1 g/dL (ref 6.0–8.5)

## 2015-03-17 LAB — VITAMIN D 25 HYDROXY (VIT D DEFICIENCY, FRACTURES): Vit D, 25-Hydroxy: 27.2 ng/mL — ABNORMAL LOW (ref 30.0–100.0)

## 2015-03-18 ENCOUNTER — Ambulatory Visit: Payer: Medicare Other | Admitting: Family Medicine

## 2015-03-19 ENCOUNTER — Encounter: Payer: Self-pay | Admitting: Family Medicine

## 2015-03-19 ENCOUNTER — Ambulatory Visit (INDEPENDENT_AMBULATORY_CARE_PROVIDER_SITE_OTHER): Payer: Medicare Other | Admitting: Family Medicine

## 2015-03-19 ENCOUNTER — Ambulatory Visit (INDEPENDENT_AMBULATORY_CARE_PROVIDER_SITE_OTHER): Payer: Medicare Other

## 2015-03-19 VITALS — BP 139/50 | HR 55 | Temp 97.0°F | Ht 63.0 in | Wt 140.0 lb

## 2015-03-19 DIAGNOSIS — E785 Hyperlipidemia, unspecified: Secondary | ICD-10-CM | POA: Diagnosis not present

## 2015-03-19 DIAGNOSIS — I517 Cardiomegaly: Secondary | ICD-10-CM | POA: Diagnosis not present

## 2015-03-19 DIAGNOSIS — N189 Chronic kidney disease, unspecified: Secondary | ICD-10-CM

## 2015-03-19 DIAGNOSIS — I10 Essential (primary) hypertension: Secondary | ICD-10-CM

## 2015-03-19 DIAGNOSIS — Z889 Allergy status to unspecified drugs, medicaments and biological substances status: Secondary | ICD-10-CM | POA: Diagnosis not present

## 2015-03-19 DIAGNOSIS — I82403 Acute embolism and thrombosis of unspecified deep veins of lower extremity, bilateral: Secondary | ICD-10-CM | POA: Diagnosis not present

## 2015-03-19 DIAGNOSIS — Z789 Other specified health status: Secondary | ICD-10-CM | POA: Insufficient documentation

## 2015-03-19 DIAGNOSIS — M109 Gout, unspecified: Secondary | ICD-10-CM | POA: Diagnosis not present

## 2015-03-19 NOTE — Progress Notes (Signed)
Subjective:    Patient ID: Elizabeth Burch, female    DOB: June 06, 1926, 81 y.o.   MRN: [id number]  HPI  81 year old female who comes in today to follow up on chronic medical conditions which include hyperlipidemia, hypertension, gout, and chronic kidney disease. She would also like to review the lab work that she had done on 03/16/15. She is on Coumadin for a chronic DVT and she has recently had a pro time done. Because she has not had a chest x-ray recently she will get one today. She was also be given an FOBT to return and we will review her lab work with her as requested. The patient is followed regularly every 6 months by the rheumatologist and nephrologist. These are Dr. Amil Amen and Mercy Moore respectively. She sees a cardiologist every 12 months and that visit will be coming up in June and that is Dr. Aundra Dubin. She is pleasant and alert and has been dealing with her husband who is recently had a stroke affecting his vision and his memory mildly. The good news is that he is recovering slowly and this is making her life less stressful.   Patient Active Problem List   Diagnosis Date Noted  . Osteoporosis 12/24/2013  . Kyphosis 11/03/2013  . Acute venous embolism and thrombosis of deep vessels of distal lower extremity 08/26/2013  . 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 Fibrocystic disease of right breast 05/22/2013  . DVT (deep venous thrombosis) 02/16/2013  . Leg edema, left 09/16/2012  . Gout   . CKD (chronic kidney disease) 01/08/2012  . Multinodular thyroid 10/25/2011  . Fibrocystic breast disease, left.   . Open-angle glaucoma   . Hyperlipidemia 01/06/2009  . Essential hypertension 01/06/2009   Outpatient Encounter Prescriptions as of 03/19/2015  Medication Sig  . amLODipine (NORVASC) 5 MG tablet TAKE ONE TABLET BY MOUTH EVERY DAY  . calcitRIOL (ROCALTROL) 0.25 MCG capsule Take 0.25 mcg by mouth daily.  . colchicine (COLCRYS) 0.6 MG tablet Take 1 tablet (0.6 mg  total) by mouth 2 (two) times daily as needed.  . dorzolamide-timolol (COSOPT) 22.3-6.8 MG/ML ophthalmic solution   . febuxostat (ULORIC) 40 MG tablet Take 1 tablet (40 mg total) by mouth daily.  . furosemide (LASIX) 40 MG tablet Take 40 mg by mouth daily.   Marland Kitchen latanoprost (XALATAN) 0.005 % ophthalmic solution Place 1 drop into both eyes at bedtime.   Marland Kitchen losartan (COZAAR) 100 MG tablet TAKE ONE TABLET BY MOUTH ONCE DAILY  . metoprolol succinate (TOPROL-XL) 25 MG 24 hr tablet TAKE ONE TABLET BY MOUTH ONCE DAILY  . Omega-3 Fatty Acids (FISH OIL) 1000 MG CAPS Take 1,000 mg by mouth daily.  . pantoprazole (PROTONIX) 40 MG tablet TAKE ONE TABLET BY MOUTH ONCE DAILY  . warfarin (COUMADIN) 2 MG tablet Take 1 and 1/2 tablets by mouth once daily or as directed by anticoagulation clinic      Review of Systems  Constitutional: Negative.   HENT: Negative.   Eyes: Negative.   Respiratory: Negative.   Cardiovascular: Negative.   Gastrointestinal: Negative.   Endocrine: Negative.   Genitourinary: Negative.   Musculoskeletal: Negative.   Skin: Negative.   Allergic/Immunologic: Negative.   Neurological: Negative.   Hematological: Negative.   Psychiatric/Behavioral: Negative.        Objective:   Physical Exam  Constitutional: She is oriented to person, place, and time. She appears well-developed and well-nourished. No distress.  Patient is alert and pleasant and calm  HENT:  Head: Normocephalic and atraumatic.  Right Ear: External ear normal.  Left Ear: External ear normal.  Nose: Nose normal.  Mouth/Throat: Oropharynx is clear and moist.  Eyes: Conjunctivae and EOM are normal. Pupils are equal, round, and reactive to light. Right eye exhibits no discharge. Left eye exhibits no discharge. No scleral icterus.  Neck: Normal range of motion. Neck supple. No thyromegaly present.  No carotid bruits or anterior cervical adenopathy  Cardiovascular: Normal rate, regular rhythm, normal heart sounds  and intact distal pulses.   No murmur heard. At 60/m  Pulmonary/Chest: Effort normal and breath sounds normal. No respiratory distress. She has no wheezes. She has no rales. She exhibits no tenderness.  Clear anteriorly and posteriorly  Abdominal: Soft. Bowel sounds are normal. She exhibits no mass. There is no tenderness. There is no rebound and no guarding.  No abdominal tenderness or inguinal nodes. No abdominal bruits.  Musculoskeletal: Normal range of motion. She exhibits no edema or tenderness.  Lymphadenopathy:    She has no cervical adenopathy.  Neurological: She is alert and oriented to person, place, and time. She has normal reflexes. No cranial nerve deficit.  Skin: Skin is warm and dry. No rash noted.  Psychiatric: She has a normal mood and affect. Her behavior is normal. Judgment and thought content normal.  Nursing note and vitals reviewed.   BP 139/50 mmHg  Pulse 55  Temp(Src) 97 F (36.1 C) (Oral)  Ht 5\' 3"  (1.6 m)  Wt 140 lb (63.504 kg)  BMI 24.81 kg/m2  [facility] reading (PRIMARY) by  Dr.Moore-chest x-ray--there is cardiomegaly and appears to be more enlarged than previously two years ago.                                      Assessment & Plan:  1. Essential hypertension -Continue current medication and avoid sodium intake as much as possible  2. Gout without tophus, unspecified cause, unspecified chronicity, unspecified site -Take medication as you have been doing and follow-up with rheumatologist  3. Hyperlipidemia -Do the best she can with your diet and exercise since you are statin intolerant  4. CKD (chronic kidney disease), unspecified stage -Continue follow-up with nephrologist as planned  5. Statin intolerance -Continue with diet and exercise  6. DVT (deep venous thrombosis), bilateral -Continue Coumadin and regular pro time checked and thromboembolic hose  7. Cardiomegaly -The patient was informed of the chest x-ray results. She denies any  shortness of breath chest pain etc. and will be seen the cardiologist in June. She has been taking her furosemide regularly.    Continue current medications. Continue good therapeutic lifestyle changes which include good diet and exercise. Fall precautions discussed with patient. If an FOBT was given today- please return it to our front desk. If you are over 68 years old - you may need Prevnar 83 or the adult Pneumonia vaccine.  Flu Shots are still available at our office. If you still haven't had one please call to set up a nurse visit to get one.   After your visit with [place] today you will receive a survey in the mail or online from [employer] regarding your care with [place]. Please take a moment to fill this out. Your feedback is very important to [place] as you can help [place] better understand your patient needs as well as improve your experience and satisfaction. WE CARE ABOUT YOU!!!  Medicare Annual Wellness Visit  [facility] and the medical providers at [facility] strive to bring you the best medical care.  In doing so we not only want to address your current medical conditions and concerns but also to detect new conditions early and prevent illness, disease and health-related problems.    Medicare offers a yearly Wellness Visit which allows our clinical staff to assess your need for preventative services including immunizations, lifestyle education, counseling to decrease risk of preventable diseases and screening for fall risk and other medical concerns.    This visit is provided free of charge (no copay) for all Medicare recipients. The clinical pharmacists at [facility] have begun to conduct these Wellness Visits which will also include a thorough review of all your medications.    As you primary medical provider recommend that you make an appointment for your Annual Wellness Visit if you have not done so already this year.   You may set up this appointment before you leave today or you may call back [phone] and schedule an appointment.  Please make sure when you call that you mention that you are scheduling your Annual Wellness Visit with the clinical pharmacist so that the appointment may be made for the proper length of time.    Arrie Senate MD

## 2015-03-19 NOTE — Patient Instructions (Signed)
Continue current medicines. Always be careful and do not put yourself at risk for falling, avoid climbing as much as possible, move slowly Continue follow-up visits with the rheumatologist, the nephrologist, and the cardiologist. Continue wearing support hose Decrease sodium in the diet as much as possible Drink plenty of water

## 2015-03-22 ENCOUNTER — Other Ambulatory Visit: Payer: Self-pay | Admitting: Cardiology

## 2015-03-29 ENCOUNTER — Other Ambulatory Visit: Payer: Medicare Other

## 2015-03-29 DIAGNOSIS — Z1212 Encounter for screening for malignant neoplasm of rectum: Secondary | ICD-10-CM

## 2015-03-29 NOTE — Progress Notes (Signed)
Lab only 

## 2015-03-31 LAB — FECAL OCCULT BLOOD, IMMUNOCHEMICAL: Fecal Occult Bld: NEGATIVE

## 2015-04-12 ENCOUNTER — Ambulatory Visit (HOSPITAL_COMMUNITY)
Admission: RE | Admit: 2015-04-12 | Discharge: 2015-04-12 | Disposition: A | Discharge status: Home / Self Care | Payer: Medicare Other | Source: Ambulatory Visit | Attending: Surgery | Admitting: Surgery

## 2015-04-12 DIAGNOSIS — Z1231 Encounter for screening mammogram for malignant neoplasm of breast: Secondary | ICD-10-CM

## 2015-04-19 ENCOUNTER — Encounter: Payer: Self-pay | Admitting: Pharmacist

## 2015-04-19 ENCOUNTER — Ambulatory Visit (INDEPENDENT_AMBULATORY_CARE_PROVIDER_SITE_OTHER): Payer: Medicare Other | Admitting: Pharmacist

## 2015-04-19 VITALS — BP 154/70 | HR 56 | Ht 63.0 in | Wt 140.0 lb

## 2015-04-19 DIAGNOSIS — Z0000 Encounter for general adult medical examination without abnormal findings: Secondary | ICD-10-CM | POA: Diagnosis not present

## 2015-04-19 DIAGNOSIS — Z79899 Other long term (current) drug therapy: Secondary | ICD-10-CM | POA: Diagnosis not present

## 2015-04-19 DIAGNOSIS — I82403 Acute embolism and thrombosis of unspecified deep veins of lower extremity, bilateral: Secondary | ICD-10-CM | POA: Diagnosis not present

## 2015-04-19 LAB — POCT INR: INR: 1.7

## 2015-04-19 NOTE — Progress Notes (Signed)
Patient ID: Elizabeth Burch, female   DOB: [birth year], 81 y.o.   MRN: [id number]    Subjective:   Elizabeth Burch is a 81 y.o. female who presents for an Initial Medicare Annual Wellness Visit and to have protime rechecked.  Patient's husband is with her today in office during visit.  Patient is pleasant and in NAD.   Current Medications (verified) Outpatient Encounter Prescriptions as of 04/19/2015  Medication Sig  . amLODipine (NORVASC) 5 MG tablet TAKE ONE TABLET BY MOUTH ONCE DAILY  . calcitRIOL (ROCALTROL) 0.25 MCG capsule Take 0.25 mcg by mouth daily.  . colchicine (COLCRYS) 0.6 MG tablet Take 1 tablet (0.6 mg total) by mouth 2 (two) times daily as needed.  . dorzolamide-timolol (COSOPT) 22.3-6.8 MG/ML ophthalmic solution Place 1 drop into both eyes 2 (two) times daily.   . febuxostat (ULORIC) 40 MG tablet Take 1 tablet (40 mg total) by mouth daily.  . furosemide (LASIX) 40 MG tablet Take 40 mg by mouth daily.   Marland Kitchen latanoprost (XALATAN) 0.005 % ophthalmic solution Place 1 drop into both eyes at bedtime.   Marland Kitchen losartan (COZAAR) 100 MG tablet TAKE ONE TABLET BY MOUTH ONCE DAILY  . metoprolol succinate (TOPROL-XL) 25 MG 24 hr tablet TAKE ONE TABLET BY MOUTH ONCE DAILY  . Omega-3 Fatty Acids (FISH OIL) 1000 MG CAPS Take 1,000 mg by mouth daily.  . pantoprazole (PROTONIX) 40 MG tablet TAKE ONE TABLET BY MOUTH ONCE DAILY (Patient taking differently: TAKE ONE TABLET BY MOUTH ONCE every other day)  . warfarin (COUMADIN) 2 MG tablet Take 1 and 1/2 tablets by mouth once daily or as directed by anticoagulation clinic   No facility-administered encounter medications on file as of 04/19/2015.    Allergies (verified) Prednisone; Ace inhibitors; Allopurinol; Alendronate sodium; Ciprofloxacin; Clarithromycin; Clindamycin/lincomycin; Nitrofurantoin; Ofloxacin; Penicillins; Sulfamethoxazole; and Sulfonamide derivatives   History: Past Medical History  Diagnosis Date  . HTN (hypertension)   .  Fibrocystic breast disease   . Palpitations   . Glaucoma   . Multiple thyroid nodules   . Stasis dermatitis   . Gout   . DVT (deep venous thrombosis) 1950  . Arthritis     Gout  . Peripheral vascular disease   . Chronic kidney disease, stage IV (severe)   . Diverticulosis of colon (without mention of hemorrhage) 2008    Dr. Patterson(Colonoscopy)  . Hiatal hernia 2004    Dr. Arther Dames)  . GERD (gastroesophageal reflux disease) 2004    Dr. Arther Dames)  . External hemorrhoids without mention of complication [id number]    Dr. Patterson(Colonoscopy)  . Cataract    Past Surgical History  Procedure Laterality Date  . Tonsillectomy    . Cesarean section    . Cystectomy      on colon  . Appendectomy     Family History  Problem Relation Age of Onset  . Cancer Mother     pt Francene Boyers of the origin  . Breast cancer Sister   . Heart disease Brother   . Breast cancer Sister   . Breast cancer Sister   . Cirrhosis Brother   . Diabetes Brother    Social History   Occupational History  . Retired     Social History Main Topics  . Smoking status: Never Smoker   . Smokeless tobacco: Never Used  . Alcohol Use: No  . Drug Use: No  . Sexual Activity: Yes    Do you feel safe at home?  Yes  Dietary issues and  exercise activities: Current Exercise Habits:: The patient does not participate in regular exercise at present  Current Dietary habits:  Tries to limit green leafy vegetables to 2 per week.      Objective:    Today's Vitals   04/19/15 1156  BP: 154/70  Pulse: 56  Height: 5\' 3"  (1.6 m)  Weight: 140 lb (63.504 kg)  PainSc: 0-No pain   Body mass index is 24.81 kg/(m^2).   INR was 1.7 today  Activities of Daily Living In your present state of health, do you have any difficulty performing the following activities: 04/19/2015 09/22/2014  Hearing? N N  Vision? Y N  Difficulty concentrating or making decisions? N N  Walking or climbing stairs? N N  Dressing or  bathing? N N  Doing errands, shopping? N N  Preparing Food and eating ? N -  Using the Toilet? N -  In the past six months, have you accidently leaked urine? N -  Do you have problems with loss of bowel control? N -  Managing your Medications? N -  Managing your Finances? N -  Housekeeping or managing your Housekeeping? N -    Are there smokers in your home (other than you)? No   Cardiac Risk Factors include: advanced age (>68men, >60 women);dyslipidemia;family history of premature cardiovascular disease;hypertension  Depression Screen PHQ 2/9 Scores 04/19/2015 11/10/2014 09/22/2014 06/10/2014  PHQ - 2 Score 0 0 0 0   ** patient has experienced increased stress over the last 2 months due to her husband's change in health.  He has a stroke and has had to depend on her more than usual.  He is gaining strength but she reports some days he gets confused and that he is weak.  He use to do all the driving but she is the only one who drives now.  Fall Risk Fall Risk  04/19/2015 11/10/2014 09/22/2014 06/10/2014 07/28/2013  Falls in the past year? No No No No No    Cognitive Function: MMSE - Mini Mental State Exam 04/19/2015 04/19/2015  Orientation to time 5 5  Orientation to Place 5 5  Registration 3 3  Attention/ Calculation 5 5  Recall 3 3  Language- name 2 objects 2 2  Language- repeat 1 1  Language- follow 3 step command 3 3  Language- read & follow direction 1 1  Write a sentence 1 -  Copy design 1 -  Total score 30 -    Immunizations and Health Maintenance Immunization History  Administered Date(s) Administered  . Influenza,inj,Quad PF,36+ Mos 10/23/2013   There are no preventive care reminders to display for this patient.  Patient Care Team: Chipper Herb, MD as PCP - General (Family Medicine) Larey Dresser, MD as Consulting Physician (Cardiology) Alphonsa Overall, MD (General Surgery) Fleet Contras, MD (Nephrology) Sable Feil, MD (Gastroenterology) Leigh Aurora,  MD (Rheumatology)   Sees Dr Binnie Rail ophthalmologist in [place] for [place]. Last visit was 04/2015  Indicate any recent Medical Services you may have received from other than [facility] providers in the past year (date may be approximate).    Assessment:    Annual Wellness Visit  Subtherapeutic anticoagulation   Screening Tests Health Maintenance  Topic Date Due  . PNA vac Low Risk Adult (1 of 2 - PCV13) 07/12/2015 (Originally 10/20/1993)  . INFLUENZA VACCINE  08/04/2015 (Originally 07/05/2015)  . MAMMOGRAM  04/11/2016  . PAP SMEAR  09/22/2016  . COLONOSCOPY  10/01/2017  . TETANUS/TDAP  07/04/2021  . DEXA  SCAN  Completed        Plan:   During the course of the visit Tameya was educated and counseled about the following appropriate screening and preventive services:   Vaccines to include Pneumoccal, Influenza, Hepatitis B, Td, Zostavax - patient refuses Prevnar 13, Zostavax and Hep B vaccines  Colorectal cancer screening - UTD on FOBT;  Patient no longer required to get colonoscnopy  Cardiovascular disease screening - BP was elevated in office today but she checks at home and BP readings are usually 130's/ 50's.  HR was in 50's in office so I did not feel comfortable increasing or add medication for BP.  Patient will continue to check BP at home and report to office if she gets readings over 140/90 or HR less than 50.  Diabetes screening - UTD  Bone Denisty / Osteoporosis Screening - UTD next due 10/2016  Mammogram - UTD  Glaucoma screening / Diabetic Eye Exam - UTD, requesting report from last visit from Dr Binnie Rail  Advanced Directives - UTD; copy requested.  Discontinue pantoprazole - patient is only taking 3 times per week and has not experienced increase symptoms related to GERD / heartburn .   Anticoagulation Dose Instructions as of 04/19/2015      [name] Tue Wed Thu Fri Sat   New Dose 3 mg 2 mg 3 mg 3 mg 3 mg 2 mg 3 mg    Description        Take 1 and 1/2  tablets today - Monday, May 16th then increase warfarin 2mg  dose to 1 tablet mondays and fridays.  Take 1 and 1/2 tablet all other days.       Patient Instructions (the written plan) were given to the patient.   Cherre Robins, [place], CPP, CDE   04/19/2015

## 2015-04-19 NOTE — Patient Instructions (Addendum)
Anticoagulation Dose Instructions as of 04/19/2015      Elizabeth Burch Tue Wed Thu Fri Sat   New Dose 3 mg 2 mg 3 mg 3 mg 3 mg 2 mg 3 mg    Description        Take 1 and 1/2 tablets today - Monday, May 16th then increase warfarin 51m dose to 1 tablet mondays and fridays.  Take 1 and 1/2 tablet all other days.      INR was 1.7 today (too thick) - goal is 2.0 to 3.0   Elizabeth Burch , Thank you for taking time to come for your Medicare Wellness Visit. I appreciate your ongoing commitment to your health goals. Please review the following plan we discussed and let me know if I can assist you in the future.    This is a list of the screening recommended for you and due dates:  Health Maintenance  Topic Date Due  . Pneumonia vaccine - Prevnar 13 Due now  . Flu Shot  08/04/2015*  . Mammogram  04/11/2016  . Pap Smear  09/22/2016  . Colon Cancer Screening  10/01/2017  . Tetanus Vaccine  07/04/2021  . DEXA scan (bone density measurement)  Completed  *Topic was postponed. The date shown is not the original due date.    Preventive Care for Adults A healthy lifestyle and preventive care can promote health and wellness. Preventive health guidelines for women include the following key practices.  A routine yearly physical is a good way to check with your health care provider about your health and preventive screening. It is a chance to share any concerns and updates on your health and to receive a thorough exam.  Visit your dentist for a routine exam and preventive care every 6 months. Brush your teeth twice a day and floss once a day. Good oral hygiene prevents tooth decay and gum disease.  The frequency of eye exams is based on your age, health, family medical history, use of contact lenses, and other factors. Follow your health care provider's recommendations for frequency of eye exams.  Eat a healthy diet. Foods like vegetables, fruits, whole grains, low-fat dairy products, and lean protein foods contain  the nutrients you need without too many calories. Decrease your intake of foods high in solid fats, added sugars, and salt. Eat the right amount of calories for you.Get information about a proper diet from your health care provider, if necessary.  Regular physical exercise is one of the most important things you can do for your health. Most adults should get at least 150 minutes of moderate-intensity exercise (any activity that increases your heart rate and causes you to sweat) each week. In addition, most adults need muscle-strengthening exercises on 2 or more days a week.  Maintain a healthy weight. The body mass index (BMI) is a screening tool to identify possible weight problems. It provides an estimate of body fat based on height and weight. Your health care provider can find your BMI and can help you achieve or maintain a healthy weight.For adults 20 years and older:  A BMI below 18.5 is considered underweight.  A BMI of 18.5 to 24.9 is normal.  A BMI of 25 to 29.9 is considered overweight.  A BMI of 30 and above is considered obese.  Maintain normal blood lipids and cholesterol levels by exercising and minimizing your intake of saturated fat. Eat a balanced diet with plenty of fruit and vegetables. Blood tests for lipids and cholesterol  should begin at age 16 and be repeated every 5 years. If your lipid or cholesterol levels are high, you are over 50, or you are at high risk for heart disease, you may need your cholesterol levels checked more frequently.Ongoing high lipid and cholesterol levels should be treated with medicines if diet and exercise are not working.  If you smoke, find out from your health care provider how to quit. If you do not use tobacco, do not start.  Lung cancer screening is recommended for adults aged 37-80 years who are at high risk for developing lung cancer because of a history of smoking. A yearly low-dose CT scan of the lungs is recommended for people who have  at least a 30-pack-year history of smoking and are a current smoker or have quit within the past 15 years. A pack year of smoking is smoking an average of 1 pack of cigarettes a day for 1 year (for example: 1 pack a day for 30 years or 2 packs a day for 15 years). Yearly screening should continue until the smoker has stopped smoking for at least 15 years. Yearly screening should be stopped for people who develop a health problem that would prevent them from having lung cancer treatment.  If you are pregnant, do not drink alcohol. If you are breastfeeding, be very cautious about drinking alcohol. If you are not pregnant and choose to drink alcohol, do not have more than 1 drink per day. One drink is considered to be 12 ounces (355 mL) of beer, 5 ounces (148 mL) of wine, or 1.5 ounces (44 mL) of liquor.  Avoid use of street drugs. Do not share needles with anyone. Ask for help if you need support or instructions about stopping the use of drugs.  High blood pressure causes heart disease and increases the risk of stroke. Your blood pressure should be checked at least every 1 to 2 years. Ongoing high blood pressure should be treated with medicines if weight loss and exercise do not work.  If you are 35-56 years old, ask your health care provider if you should take aspirin to prevent strokes.  Diabetes screening involves taking a blood sample to check your fasting blood sugar level. This should be done once every 3 years, after age 54, if you are within normal weight and without risk factors for diabetes. Testing should be considered at a younger age or be carried out more frequently if you are overweight and have at least 1 risk factor for diabetes.  Breast cancer screening is essential preventive care for women. You should practice "breast self-awareness." This means understanding the normal appearance and feel of your breasts and may include breast self-examination. Any changes detected, no matter how  small, should be reported to a health care provider. Women in their 23s and 30s should have a clinical breast exam (CBE) by a health care provider as part of a regular health exam every 1 to 3 years. After age 53, women should have a CBE every year. Starting at age 92, women should consider having a mammogram (breast X-ray test) every year. Women who have a family history of breast cancer should talk to their health care provider about genetic screening. Women at a high risk of breast cancer should talk to their health care providers about having an MRI and a mammogram every year.  Breast cancer gene (BRCA)-related cancer risk assessment is recommended for women who have family members with BRCA-related cancers. BRCA-related cancers include breast,  ovarian, tubal, and peritoneal cancers. Having family members with these cancers may be associated with an increased risk for harmful changes (mutations) in the breast cancer genes BRCA1 and BRCA2. Results of the assessment will determine the need for genetic counseling and BRCA1 and BRCA2 testing.  Routine pelvic exams to screen for cancer are no longer recommended for nonpregnant women who are considered low risk for cancer of the pelvic organs (ovaries, uterus, and vagina) and who do not have symptoms. Ask your health care provider if a screening pelvic exam is right for you.  If you have had past treatment for cervical cancer or a condition that could lead to cancer, you need Pap tests and screening for cancer for at least 20 years after your treatment. If Pap tests have been discontinued, your risk factors (such as having a new sexual partner) need to be reassessed to determine if screening should be resumed. Some women have medical problems that increase the chance of getting cervical cancer. In these cases, your health care provider may recommend more frequent screening and Pap tests.  The HPV test is an additional test that may be used for cervical cancer  screening. The HPV test looks for the virus that can cause the cell changes on the cervix. The cells collected during the Pap test can be tested for HPV. The HPV test could be used to screen women aged 87 years and older, and should be used in women of any age who have unclear Pap test results. After the age of 35, women should have HPV testing at the same frequency as a Pap test.  Colorectal cancer can be detected and often prevented. Most routine colorectal cancer screening begins at the age of 92 years and continues through age 41 years. However, your health care provider may recommend screening at an earlier age if you have risk factors for colon cancer. On a yearly basis, your health care provider may provide home test kits to check for hidden blood in the stool. Use of a small camera at the end of a tube, to directly examine the colon (sigmoidoscopy or colonoscopy), can detect the earliest forms of colorectal cancer. Talk to your health care provider about this at age 78, when routine screening begins. Direct exam of the colon should be repeated every 5-10 years through age 51 years, unless early forms of pre-cancerous polyps or small growths are found.  People who are at an increased risk for hepatitis B should be screened for this virus. You are considered at high risk for hepatitis B if:  You were born in a country where hepatitis B occurs often. Talk with your health care provider about which countries are considered high risk.  Your parents were born in a high-risk country and you have not received a shot to protect against hepatitis B (hepatitis B vaccine).  You have HIV or AIDS.  You use needles to inject street drugs.  You live with, or have sex with, someone who has hepatitis B.  You get hemodialysis treatment.  You take certain medicines for conditions like cancer, organ transplantation, and autoimmune conditions.  Hepatitis C blood testing is recommended for all people born from  [birth year] through 1965 and any individual with known risks for hepatitis C.  Practice safe sex. Use condoms and avoid high-risk sexual practices to reduce the spread of sexually transmitted infections (STIs). STIs include gonorrhea, chlamydia, syphilis, trichomonas, herpes, HPV, and human immunodeficiency virus (HIV). Herpes, HIV, and HPV are viral illnesses  that have no cure. They can result in disability, cancer, and death.  You should be screened for sexually transmitted illnesses (STIs) including gonorrhea and chlamydia if:  You are sexually active and are younger than 24 years.  You are older than 24 years and your health care provider tells you that you are at risk for this type of infection.  Your sexual activity has changed since you were last screened and you are at an increased risk for chlamydia or gonorrhea. Ask your health care provider if you are at risk.  If you are at risk of being infected with HIV, it is recommended that you take a prescription medicine daily to prevent HIV infection. This is called preexposure prophylaxis (PrEP). You are considered at risk if:  You are a heterosexual woman, are sexually active, and are at increased risk for HIV infection.  You take drugs by injection.  You are sexually active with a partner who has HIV.  Talk with your health care provider about whether you are at high risk of being infected with HIV. If you choose to begin PrEP, you should first be tested for HIV. You should then be tested every 3 months for as long as you are taking PrEP.  Osteoporosis is a disease in which the bones lose minerals and strength with aging. This can result in serious bone fractures or breaks. The risk of osteoporosis can be identified using a bone density scan. Women ages 40 years and over and women at risk for fractures or osteoporosis should discuss screening with their health care providers. Ask your health care provider whether you should take a calcium  supplement or vitamin D to reduce the rate of osteoporosis.  Menopause can be associated with physical symptoms and risks. Hormone replacement therapy is available to decrease symptoms and risks. You should talk to your health care provider about whether hormone replacement therapy is right for you.  Use sunscreen. Apply sunscreen liberally and repeatedly throughout the day. You should seek shade when your shadow is shorter than you. Protect yourself by wearing long sleeves, pants, a wide-brimmed hat, and sunglasses year round, whenever you are outdoors.  Once a month, do a whole body skin exam, using a mirror to look at the skin on your back. Tell your health care provider of new moles, moles that have irregular borders, moles that are larger than a pencil eraser, or moles that have changed in shape or color.  Stay current with required vaccines (immunizations).  Influenza vaccine. All adults should be immunized every year.  Tetanus, diphtheria, and acellular pertussis (Td, Tdap) vaccine. Pregnant women should receive 1 dose of Tdap vaccine during each pregnancy. The dose should be obtained regardless of the length of time since the last dose. Immunization is preferred during the 27th-36th week of gestation. An adult who has not previously received Tdap or who does not know her vaccine status should receive 1 dose of Tdap. This initial dose should be followed by tetanus and diphtheria toxoids (Td) booster doses every 10 years. Adults with an unknown or incomplete history of completing a 3-dose immunization series with Td-containing vaccines should begin or complete a primary immunization series including a Tdap dose. Adults should receive a Td booster every 10 years.  Varicella vaccine. An adult without evidence of immunity to varicella should receive 2 doses or a second dose if she has previously received 1 dose. Pregnant females who do not have evidence of immunity should receive the first dose  after pregnancy. This first dose should be obtained before leaving the health care facility. The second dose should be obtained 4-8 weeks after the first dose.  Human papillomavirus (HPV) vaccine. Females aged 13-26 years who have not received the vaccine previously should obtain the 3-dose series. The vaccine is not recommended for use in pregnant females. However, pregnancy testing is not needed before receiving a dose. If a female is found to be pregnant after receiving a dose, no treatment is needed. In that case, the remaining doses should be delayed until after the pregnancy. Immunization is recommended for any person with an immunocompromised condition through the age of 62 years if she did not get any or all doses earlier. During the 3-dose series, the second dose should be obtained 4-8 weeks after the first dose. The third dose should be obtained 24 weeks after the first dose and 16 weeks after the second dose.  Zoster vaccine. One dose is recommended for adults aged 32 years or older unless certain conditions are present.  Measles, mumps, and rubella (MMR) vaccine. Adults born before [birth year] generally are considered immune to measles and mumps. Adults born in [birth year] or later should have 1 or more doses of MMR vaccine unless there is a contraindication to the vaccine or there is laboratory evidence of immunity to each of the three diseases. A routine second dose of MMR vaccine should be obtained at least 28 days after the first dose for students attending postsecondary schools, health care workers, or international travelers. People who received inactivated measles vaccine or an unknown type of measles vaccine during 1963-1967 should receive 2 doses of MMR vaccine. People who received inactivated mumps vaccine or an unknown type of mumps vaccine before 1979 and are at high risk for mumps infection should consider immunization with 2 doses of MMR vaccine. For females of childbearing age, rubella immunity  should be determined. If there is no evidence of immunity, females who are not pregnant should be vaccinated. If there is no evidence of immunity, females who are pregnant should delay immunization until after pregnancy. Unvaccinated health care workers born before [birth year] who lack laboratory evidence of measles, mumps, or rubella immunity or laboratory confirmation of disease should consider measles and mumps immunization with 2 doses of MMR vaccine or rubella immunization with 1 dose of MMR vaccine.  Pneumococcal 13-valent conjugate (PCV13) vaccine. When indicated, a person who is uncertain of her immunization history and has no record of immunization should receive the PCV13 vaccine. An adult aged 35 years or older who has certain medical conditions and has not been previously immunized should receive 1 dose of PCV13 vaccine. This PCV13 should be followed with a dose of pneumococcal polysaccharide (PPSV23) vaccine. The PPSV23 vaccine dose should be obtained at least 8 weeks after the dose of PCV13 vaccine. An adult aged 71 years or older who has certain medical conditions and previously received 1 or more doses of PPSV23 vaccine should receive 1 dose of PCV13. The PCV13 vaccine dose should be obtained 1 or more years after the last PPSV23 vaccine dose.  Pneumococcal polysaccharide (PPSV23) vaccine. When PCV13 is also indicated, PCV13 should be obtained first. All adults aged 79 years and older should be immunized. An adult younger than age 54 years who has certain medical conditions should be immunized. Any person who resides in a nursing home or long-term care facility should be immunized. An adult smoker should be immunized. People with an immunocompromised condition and certain other conditions should receive  both PCV13 and PPSV23 vaccines. People with human immunodeficiency virus (HIV) infection should be immunized as soon as possible after diagnosis. Immunization during chemotherapy or radiation therapy  should be avoided. Routine use of PPSV23 vaccine is not recommended for American Indians, [place] Natives, or people younger than 65 years unless there are medical conditions that require PPSV23 vaccine. When indicated, people who have unknown immunization and have no record of immunization should receive PPSV23 vaccine. One-time revaccination 5 years after the first dose of PPSV23 is recommended for people aged 19-64 years who have chronic kidney failure, nephrotic syndrome, asplenia, or immunocompromised conditions. People who received 1-2 doses of PPSV23 before age 8 years should receive another dose of PPSV23 vaccine at age 79 years or later if at least 5 years have passed since the previous dose. Doses of PPSV23 are not needed for people immunized with PPSV23 at or after age 68 years.  Meningococcal vaccine. Adults with asplenia or persistent complement component deficiencies should receive 2 doses of quadrivalent meningococcal conjugate (MenACWY-D) vaccine. The doses should be obtained at least 2 months apart. Microbiologists working with certain meningococcal bacteria, [place] recruits, people at risk during an outbreak, and people who travel to or live in countries with a high rate of meningitis should be immunized. A first-year college student up through age 47 years who is living in a residence hall should receive a dose if she did not receive a dose on or after her 16th birthday. Adults who have certain high-risk conditions should receive one or more doses of vaccine.  Hepatitis A vaccine. Adults who wish to be protected from this disease, have certain high-risk conditions, work with hepatitis A-infected animals, work in hepatitis A research labs, or travel to or work in countries with a high rate of hepatitis A should be immunized. Adults who were previously unvaccinated and who anticipate close contact with an international adoptee during the first 60 days after arrival in the [place] from a  country with a high rate of hepatitis A should be immunized.  Hepatitis B vaccine. Adults who wish to be protected from this disease, have certain high-risk conditions, may be exposed to blood or other infectious body fluids, are household contacts or sex partners of hepatitis B positive people, are clients or workers in certain care facilities, or travel to or work in countries with a high rate of hepatitis B should be immunized.  Haemophilus influenzae type b (Hib) vaccine. A previously unvaccinated person with asplenia or sickle cell disease or having a scheduled splenectomy should receive 1 dose of Hib vaccine. Regardless of previous immunization, a recipient of a hematopoietic stem cell transplant should receive a 3-dose series 6-12 months after her successful transplant. Hib vaccine is not recommended for adults with HIV infection. Preventive Services / Frequency Ages 17 to 79 years  Blood pressure check.** / Every 1 to 2 years.  Lipid and cholesterol check.** / Every 5 years beginning at age 72.  Clinical breast exam.** / Every 3 years for women in their 41s and 15s.  BRCA-related cancer risk assessment.** / For women who have family members with a BRCA-related cancer (breast, ovarian, tubal, or peritoneal cancers).  Pap test.** / Every 2 years from ages 30 through 4. Every 3 years starting at age 33 through age 62 or 73 with a history of 3 consecutive normal Pap tests.  HPV screening.** / Every 3 years from ages 19 through ages 58 to 89 with a history of 3 consecutive normal Pap  tests.  Hepatitis C blood test.** / For any individual with known risks for hepatitis C.  Skin self-exam. / Monthly.  Influenza vaccine. / Every year.  Tetanus, diphtheria, and acellular pertussis (Tdap, Td) vaccine.** / Consult your health care provider. Pregnant women should receive 1 dose of Tdap vaccine during each pregnancy. 1 dose of Td every 10 years.  Varicella vaccine.** / Consult your health  care provider. Pregnant females who do not have evidence of immunity should receive the first dose after pregnancy.  HPV vaccine. / 3 doses over 6 months, if 88 and younger. The vaccine is not recommended for use in pregnant females. However, pregnancy testing is not needed before receiving a dose.  Measles, mumps, rubella (MMR) vaccine.** / You need at least 1 dose of MMR if you were born in 1957 or later. You may also need a 2nd dose. For females of childbearing age, rubella immunity should be determined. If there is no evidence of immunity, females who are not pregnant should be vaccinated. If there is no evidence of immunity, females who are pregnant should delay immunization until after pregnancy.  Pneumococcal 13-valent conjugate (PCV13) vaccine.** / Consult your health care provider.  Pneumococcal polysaccharide (PPSV23) vaccine.** / 1 to 2 doses if you smoke cigarettes or if you have certain conditions.  Meningococcal vaccine.** / 1 dose if you are age 78 to 21 years and a [employer] researcher living in a residence hall, or have one of several medical conditions, you need to get vaccinated against meningococcal disease. You may also need additional booster doses.  Hepatitis A vaccine.** / Consult your health care provider.  Hepatitis B vaccine.** / Consult your health care provider.  Haemophilus influenzae type b (Hib) vaccine.** / Consult your health care provider. Ages 81 to 36 years  Blood pressure check.** / Every 1 to 2 years.  Lipid and cholesterol check.** / Every 5 years beginning at age 70 years.  Lung cancer screening. / Every year if you are aged 66-80 years and have a 30-pack-year history of smoking and currently smoke or have quit within the past 15 years. Yearly screening is stopped once you have quit smoking for at least 15 years or develop a health problem that would prevent you from having lung cancer treatment.  Clinical breast exam.** / Every year after age  37 years.  BRCA-related cancer risk assessment.** / For women who have family members with a BRCA-related cancer (breast, ovarian, tubal, or peritoneal cancers).  Mammogram.** / Every year beginning at age 59 years and continuing for as long as you are in good health. Consult with your health care provider.  Pap test.** / Every 3 years starting at age 48 years through age 66 or 82 years with a history of 3 consecutive normal Pap tests.  HPV screening.** / Every 3 years from ages 22 years through ages 40 to 38 years with a history of 3 consecutive normal Pap tests.  Fecal occult blood test (FOBT) of stool. / Every year beginning at age 87 years and continuing until age 46 years. You may not need to do this test if you get a colonoscopy every 10 years.  Flexible sigmoidoscopy or colonoscopy.** / Every 5 years for a flexible sigmoidoscopy or every 10 years for a colonoscopy beginning at age 10 years and continuing until age 33 years.  Hepatitis C blood test.** / For all people born from [birth year] through 1963 and any individual with known risks for hepatitis C.  Skin self-exam. /  Monthly.  Influenza vaccine. / Every year.  Tetanus, diphtheria, and acellular pertussis (Tdap/Td) vaccine.** / Consult your health care provider. Pregnant women should receive 1 dose of Tdap vaccine during each pregnancy. 1 dose of Td every 10 years.  Varicella vaccine.** / Consult your health care provider. Pregnant females who do not have evidence of immunity should receive the first dose after pregnancy.  Zoster vaccine.** / 1 dose for adults aged 7 years or older.  Measles, mumps, rubella (MMR) vaccine.** / You need at least 1 dose of MMR if you were born in 1957 or later. You may also need a 2nd dose. For females of childbearing age, rubella immunity should be determined. If there is no evidence of immunity, females who are not pregnant should be vaccinated. If there is no evidence of immunity, females who are  pregnant should delay immunization until after pregnancy.  Pneumococcal 13-valent conjugate (PCV13) vaccine.** / Consult your health care provider.  Pneumococcal polysaccharide (PPSV23) vaccine.** / 1 to 2 doses if you smoke cigarettes or if you have certain conditions.  Meningococcal vaccine.** / Consult your health care provider.  Hepatitis A vaccine.** / Consult your health care provider.  Hepatitis B vaccine.** / Consult your health care provider.  Haemophilus influenzae type b (Hib) vaccine.** / Consult your health care provider. Ages 14 years and over  Blood pressure check.** / Every 1 to 2 years.  Lipid and cholesterol check.** / Every 5 years beginning at age 60 years.  Lung cancer screening. / Every year if you are aged 81-80 years and have a 30-pack-year history of smoking and currently smoke or have quit within the past 15 years. Yearly screening is stopped once you have quit smoking for at least 15 years or develop a health problem that would prevent you from having lung cancer treatment.  Clinical breast exam.** / Every year after age 84 years.  BRCA-related cancer risk assessment.** / For women who have family members with a BRCA-related cancer (breast, ovarian, tubal, or peritoneal cancers).  Mammogram.** / Every year beginning at age 56 years and continuing for as long as you are in good health. Consult with your health care provider.  Pap test.** / Every 3 years starting at age 37 years through age 36 or 40 years with 3 consecutive normal Pap tests. Testing can be stopped between 65 and 70 years with 3 consecutive normal Pap tests and no abnormal Pap or HPV tests in the past 10 years.  HPV screening.** / Every 3 years from ages 51 years through ages 17 or 75 years with a history of 3 consecutive normal Pap tests. Testing can be stopped between 65 and 70 years with 3 consecutive normal Pap tests and no abnormal Pap or HPV tests in the past 10 years.  Fecal occult blood  test (FOBT) of stool. / Every year beginning at age 44 years and continuing until age 34 years. You may not need to do this test if you get a colonoscopy every 10 years.  Flexible sigmoidoscopy or colonoscopy.** / Every 5 years for a flexible sigmoidoscopy or every 10 years for a colonoscopy beginning at age 90 years and continuing until age 46 years.  Hepatitis C blood test.** / For all people born from [birth year] through 1963 and any individual with known risks for hepatitis C.  Osteoporosis screening.** / A one-time screening for women ages 56 years and over and women at risk for fractures or osteoporosis.  Skin self-exam. / Monthly.  Influenza vaccine. /  Every year.  Tetanus, diphtheria, and acellular pertussis (Tdap/Td) vaccine.** / 1 dose of Td every 10 years.  Varicella vaccine.** / Consult your health care provider.  Zoster vaccine.** / 1 dose for adults aged 67 years or older.  Pneumococcal 13-valent conjugate (PCV13) vaccine.** / Consult your health care provider.  Pneumococcal polysaccharide (PPSV23) vaccine.** / 1 dose for all adults aged 28 years and older.  Meningococcal vaccine.** / Consult your health care provider.  Hepatitis A vaccine.** / Consult your health care provider.  Hepatitis B vaccine.** / Consult your health care provider.  Haemophilus influenzae type b (Hib) vaccine.** / Consult your health care provider. ** Family history and personal history of risk and conditions may change your health care provider's recommendations. Document Released: 01/16/2002 Document Revised: 04/06/2014 Document Reviewed: 04/17/2011 [facility] Patient Information 2015 [place], [place]. This information is not intended to replace advice given to you by your health care provider. Make sure you discuss any questions you have with your health care provider.

## 2015-04-27 ENCOUNTER — Other Ambulatory Visit: Payer: Self-pay | Admitting: Family Medicine

## 2015-05-07 ENCOUNTER — Ambulatory Visit (INDEPENDENT_AMBULATORY_CARE_PROVIDER_SITE_OTHER): Payer: Medicare Other | Admitting: Pharmacist

## 2015-05-07 DIAGNOSIS — Z7901 Long term (current) use of anticoagulants: Secondary | ICD-10-CM | POA: Diagnosis not present

## 2015-05-07 DIAGNOSIS — I82403 Acute embolism and thrombosis of unspecified deep veins of lower extremity, bilateral: Secondary | ICD-10-CM

## 2015-05-07 LAB — POCT INR: INR: 1.6

## 2015-05-07 NOTE — Patient Instructions (Signed)
Anticoagulation Dose Instructions as of 05/07/2015      [name] Tue Wed Thu Fri Sat   New Dose 3 mg 3 mg 3 mg 3 mg 3 mg 2 mg 3 mg    Description        Take 2 tablets today - Friday, Jane 3rd, then increase warfarin 2mg  dose to 1 tablet fridays.  Take 1 and 1/2 tablet all other days.      INR was 1.6 today

## 2015-05-13 ENCOUNTER — Ambulatory Visit (INDEPENDENT_AMBULATORY_CARE_PROVIDER_SITE_OTHER): Payer: Medicare Other | Admitting: Pharmacist

## 2015-05-13 DIAGNOSIS — I82403 Acute embolism and thrombosis of unspecified deep veins of lower extremity, bilateral: Secondary | ICD-10-CM

## 2015-05-13 LAB — POCT INR: INR: 2

## 2015-05-13 NOTE — Patient Instructions (Signed)
Anticoagulation Dose Instructions as of 05/13/2015      Elizabeth Burch Tue Wed Thu Fri Sat   New Dose 3 mg 3 mg 3 mg 3 mg 3 mg 2 mg 3 mg    Description        Continue warfarin 2mg  tablet on Fridays.  Take 1 and 1/2 tablet all other days.      INR was 2.0

## 2015-05-27 ENCOUNTER — Encounter: Payer: Self-pay | Admitting: *Deleted

## 2015-05-27 ENCOUNTER — Encounter: Payer: Self-pay | Admitting: Cardiology

## 2015-05-27 ENCOUNTER — Ambulatory Visit (INDEPENDENT_AMBULATORY_CARE_PROVIDER_SITE_OTHER): Payer: Medicare Other | Admitting: Cardiology

## 2015-05-27 VITALS — BP 166/66 | HR 58 | Ht 63.0 in | Wt 141.0 lb

## 2015-05-27 DIAGNOSIS — I82409 Acute embolism and thrombosis of unspecified deep veins of unspecified lower extremity: Secondary | ICD-10-CM

## 2015-05-27 DIAGNOSIS — I517 Cardiomegaly: Secondary | ICD-10-CM

## 2015-05-27 DIAGNOSIS — R011 Cardiac murmur, unspecified: Secondary | ICD-10-CM | POA: Diagnosis not present

## 2015-05-27 DIAGNOSIS — I10 Essential (primary) hypertension: Secondary | ICD-10-CM | POA: Diagnosis not present

## 2015-05-27 MED ORDER — AMLODIPINE BESYLATE 10 MG PO TABS: 10.0000 mg | ORAL_TABLET | Freq: Every day | ORAL | Status: DC

## 2015-05-27 NOTE — Patient Instructions (Signed)
Medication Instructions:  Increase amlodipine to 10mg  daily  Labwork: None today  Testing/Procedures: Your physician has requested that you have an echocardiogram. Echocardiography is a painless test that uses sound waves to create images of your heart. It provides your doctor with information about the size and shape of your heart and how well your heart's chambers and valves are working. This procedure takes approximately one hour. There are no restrictions for this procedure.    Follow-Up: Your physician wants you to follow-up in: 1 year with Dr Aundra Dubin. (June 2017).  You will receive a reminder letter in the mail two months in advance. If you don't receive a letter, please call our office to schedule the follow-up appointment.    Thank you for choosing [facility]!!

## 2015-05-28 NOTE — Progress Notes (Signed)
Patient ID: Elizabeth Burch, female   DOB: 04-Jun-1926, 81 y.o.   MRN: [id number] PCP: Dr. Laurance Flatten  81 yo with history of HTN, CKD, and palpitations returns for cardiology followup. She developed a spontaneous DVT on the left in 10/13 with negative hypercoagulable workup.  She is now on coumadin.  No exertional chest pain or dyspnea. She continues to walk 1-2 miles a day on her treadmill with no problems.  She is steady on her feet.  SBP 130s-140s when she checks at home, 166/66 today.  Minimal palpitations on Toprol XL.  LDL particle number is high but she has been intolerant of a number of statins.  He husband had stroke this year and she is doing more of the work around the house.  No BRBPR or melena.  Recent CXR showed cardiomegaly.   Labs (7/10): K 5.7, creatinine 1.44  Labs (11/10): K 4.6, creatinine 1.38  Labs (9/11): HDL 96, LDL 69, creatinine  Labs (12/11): TSH normal  Labs (11/12): K 4.5, creatinine 1.98 Labs (1/14): K 4.3, creatinine 1.78 Labas (3/15): K 4.4, creatinine 2.05, LDL particle number 1548, LDL 123 Labs (4/16): K 4.4, creatinine 1.76, LDL-P 1288   ECG: NSR, TWI in AVL  Allergies (verified):  1) ! Macrodantin  2) ! Cipro  3) ! Pcn  4) ! Biaxin  5) ! Fosamax  6) ! Ace Inhibitors  7) ! Floxin  8) ! Sulfa  9) Sulfamethoxazole (Sulfamethoxazole)  10) Penicillin V Potassium (Penicillin V Potassium)   Past Medical History:  1. Hypertension.  2. Hyperlipidemia: Myalgias with multiple statins.  3. Gastroesophageal reflux disease.  4. Fibrocystic breast disease,.  5. History of colon polyps.  6. Palpitations. The patient did have a Holter monitor done in January 2010, showing quite frequent PACs, but no SVT or no other serious arrhythmia. 3 week event monitor in 9/11 showed mild sinus bradycardia to the 50s at times, otherwise unremarkable.  7. Echocardiogram done in January 2010 showed an EF of 70%, normal LV size, no regional Strahan motion abnormalities. The valves look  normal. There is mild pulmonary hypertension with pulmonary artery systolic pressure of 40 mmHg. Echo (10/11): Mild focal basal septal hypertrophy, EF 123456, grade I diastolic dysfunction, PA systolic pressure 31 mmHg, borderline atrial septal aneurysm.  8. CKD with mild hyperkalemia  9. Carotid dopplers (10/11): normal.  10. Multinodular goiter 11. Gout 12. DVT: 1st episode after childbirth, then another she thinks early 2000s, third episode 10/13.   Family History:  The patient's brother had an MI in his 110s.   Social History:  Married, lives in [place].  Tobacco Use - No.  Alcohol Use - no  ROS: All systems reviewed and negative except as per HPI.   Current Outpatient Prescriptions  Medication Sig Dispense Refill  . calcitRIOL (ROCALTROL) 0.25 MCG capsule Take 0.25 mcg by mouth daily.    . colchicine (COLCRYS) 0.6 MG tablet Take 1 tablet (0.6 mg total) by mouth 2 (two) times daily as needed. 60 tablet 0  . dorzolamide-timolol (COSOPT) 22.3-6.8 MG/ML ophthalmic solution Place 1 drop into both eyes 2 (two) times daily.     . febuxostat (ULORIC) 40 MG tablet Take 1 tablet (40 mg total) by mouth daily. 14 tablet 0  . furosemide (LASIX) 40 MG tablet Take 40 mg by mouth daily.     Marland Kitchen latanoprost (XALATAN) 0.005 % ophthalmic solution Place 1 drop into both eyes at bedtime.     Marland Kitchen losartan (COZAAR) 100 MG tablet  TAKE ONE TABLET BY MOUTH ONCE DAILY 30 tablet 4  . metoprolol succinate (TOPROL-XL) 25 MG 24 hr tablet TAKE ONE TABLET BY MOUTH ONCE DAILY 30 tablet 11  . Omega-3 Fatty Acids (FISH OIL) 1000 MG CAPS Take 1,000 mg by mouth daily.    Marland Kitchen warfarin (COUMADIN) 2 MG tablet Take 1 and 1/2 tablets by mouth once daily or as directed by anticoagulation clinic (Patient taking differently: Take 1 and 1/2 tablets by mouth once daily except Friday take 1 tablet, as directed by anticoagulation clinic) 135 tablet 1  . warfarin (COUMADIN) 2 MG tablet Take 2 mg by mouth daily. 1.5 tablets daily except  on Fridays take 1 tablet by mouth    . amLODipine (NORVASC) 10 MG tablet Take 1 tablet (10 mg total) by mouth daily. 90 tablet 3   No current facility-administered medications for this visit.    BP 166/66 mmHg  Pulse 58  Ht 5\' 3"  (1.6 m)  Wt 141 lb (63.957 kg)  BMI 24.98 kg/m2 General: NAD Neck: No JVD, no thyromegaly or thyroid nodule.  Lungs: Clear to auscultation bilaterally with normal respiratory effort. CV: Nondisplaced PMI.  Heart regular S1/S2, no S3/S4, 1/6 early SEM RUSB.  1+ ankle edema.  No carotid bruit.  Normal pedal pulses.  Abdomen: Soft, nontender, no hepatosplenomegaly, no distention.  Neurologic: Alert and oriented x 3.  Psych: Normal affect. Extremities: No clubbing or cyanosis.   Assessment/Plan: 1. HTN: BP running high.  Given CKD, would be reasonable to aim lower.  Will increase amlodipine to 10 mg daily.  2. DVT: Left DVT found in 10/13.  She is on coumadin.  This was a spontaneous DVT.  Sounds like she has had DVTs (potentially 2) in the past.  Hypercoagulable workup was unremarkable.  I would continue coumadin long-term.  3. CKD: Stable, follows with nephrology.  4. Hyperlipidemia: She has been unable to tolerate any statin due to myalgias.  5. Cardiomegaly: On recent CXR.  I will get an echocardiogram to assess.  [name] 05/28/2015

## 2015-06-02 ENCOUNTER — Ambulatory Visit (INDEPENDENT_AMBULATORY_CARE_PROVIDER_SITE_OTHER): Payer: Medicare Other | Admitting: Pharmacist

## 2015-06-02 DIAGNOSIS — I82403 Acute embolism and thrombosis of unspecified deep veins of lower extremity, bilateral: Secondary | ICD-10-CM

## 2015-06-02 LAB — POCT INR: INR: 1.9

## 2015-06-02 NOTE — Patient Instructions (Signed)
Anticoagulation Dose Instructions as of 06/02/2015      Elizabeth Burch Tue Wed Thu Fri Sat   New Dose 3 mg 3 mg 3 mg 3 mg 3 mg 3 mg 3 mg    Description        Increas dose to warfarin 2mg  - take 1 and 1/2 tablets daily      INR was 1.9 today

## 2015-06-08 ENCOUNTER — Other Ambulatory Visit: Payer: Self-pay

## 2015-06-08 ENCOUNTER — Ambulatory Visit (HOSPITAL_COMMUNITY): Payer: Medicare Other | Attending: Cardiology

## 2015-06-08 DIAGNOSIS — I059 Rheumatic mitral valve disease, unspecified: Secondary | ICD-10-CM | POA: Diagnosis not present

## 2015-06-08 DIAGNOSIS — I129 Hypertensive chronic kidney disease with stage 1 through stage 4 chronic kidney disease, or unspecified chronic kidney disease: Secondary | ICD-10-CM | POA: Insufficient documentation

## 2015-06-08 DIAGNOSIS — I517 Cardiomegaly: Secondary | ICD-10-CM | POA: Diagnosis not present

## 2015-06-08 DIAGNOSIS — N189 Chronic kidney disease, unspecified: Secondary | ICD-10-CM | POA: Insufficient documentation

## 2015-06-08 DIAGNOSIS — R011 Cardiac murmur, unspecified: Secondary | ICD-10-CM | POA: Diagnosis not present

## 2015-06-08 DIAGNOSIS — E785 Hyperlipidemia, unspecified: Secondary | ICD-10-CM | POA: Insufficient documentation

## 2015-06-21 ENCOUNTER — Ambulatory Visit (INDEPENDENT_AMBULATORY_CARE_PROVIDER_SITE_OTHER): Payer: Medicare Other | Admitting: Pharmacist

## 2015-06-21 DIAGNOSIS — I82403 Acute embolism and thrombosis of unspecified deep veins of lower extremity, bilateral: Secondary | ICD-10-CM

## 2015-06-21 LAB — POCT INR: INR: 2.4

## 2015-07-16 ENCOUNTER — Other Ambulatory Visit (INDEPENDENT_AMBULATORY_CARE_PROVIDER_SITE_OTHER): Payer: Medicare Other

## 2015-07-16 DIAGNOSIS — R5383 Other fatigue: Secondary | ICD-10-CM

## 2015-07-16 DIAGNOSIS — E785 Hyperlipidemia, unspecified: Secondary | ICD-10-CM | POA: Diagnosis not present

## 2015-07-16 DIAGNOSIS — E559 Vitamin D deficiency, unspecified: Secondary | ICD-10-CM

## 2015-07-16 DIAGNOSIS — N184 Chronic kidney disease, stage 4 (severe): Secondary | ICD-10-CM

## 2015-07-16 DIAGNOSIS — R5382 Chronic fatigue, unspecified: Secondary | ICD-10-CM

## 2015-07-16 DIAGNOSIS — R5381 Other malaise: Secondary | ICD-10-CM

## 2015-07-17 LAB — CBC WITH DIFFERENTIAL/PLATELET
BASOS ABS: 0 10*3/uL (ref 0.0–0.2)
BASOS: 1 %
EOS (ABSOLUTE): 0.3 10*3/uL (ref 0.0–0.4)
EOS: 3 %
Hematocrit: 34.1 % (ref 34.0–46.6)
Hemoglobin: 11.6 g/dL (ref 11.1–15.9)
Immature Grans (Abs): 0 10*3/uL (ref 0.0–0.1)
Immature Granulocytes: 0 %
LYMPHS ABS: 1.9 10*3/uL (ref 0.7–3.1)
Lymphs: 22 %
MCH: 30.5 pg (ref 26.6–33.0)
MCHC: 34 g/dL (ref 31.5–35.7)
MCV: 90 fL (ref 79–97)
Monocytes Absolute: 0.5 10*3/uL (ref 0.1–0.9)
Monocytes: 6 %
NEUTROS ABS: 5.9 10*3/uL (ref 1.4–7.0)
Neutrophils: 68 %
PLATELETS: 256 10*3/uL (ref 150–379)
RBC: 3.8 x10E6/uL (ref 3.77–5.28)
RDW: 14.2 % (ref 12.3–15.4)
WBC: 8.7 10*3/uL (ref 3.4–10.8)

## 2015-07-17 LAB — NMR, LIPOPROFILE
CHOLESTEROL: 211 mg/dL — AB (ref 100–199)
HDL Cholesterol by NMR: 94 mg/dL (ref >39)
HDL Particle Number: 46.7 umol/L (ref >=30.5)
LDL Particle Number: 939 nmol/L (ref <1000)
LDL SIZE: 21.5 nm (ref >20.5)
LDL-C: 100 mg/dL — ABNORMAL HIGH (ref 0–99)
LP-IR Score: <25 (ref <=45)
Small LDL Particle Number: <90 nmol/L (ref <=527)
Triglycerides by NMR: 85 mg/dL (ref 0–149)

## 2015-07-17 LAB — HEPATIC FUNCTION PANEL
ALBUMIN: 4 g/dL (ref 3.5–4.7)
ALT: 10 IU/L (ref 0–32)
AST: 13 IU/L (ref 0–40)
Alkaline Phosphatase: 70 IU/L (ref 39–117)
BILIRUBIN, DIRECT: 0.1 mg/dL (ref 0.00–0.40)
Bilirubin Total: 0.3 mg/dL (ref 0.0–1.2)
Total Protein: 6.2 g/dL (ref 6.0–8.5)

## 2015-07-17 LAB — BMP8+EGFR
BUN / CREAT RATIO: 18 (ref 11–26)
BUN: 42 mg/dL — ABNORMAL HIGH (ref 8–27)
CALCIUM: 10.1 mg/dL (ref 8.7–10.3)
CO2: 22 mmol/L (ref 18–29)
Chloride: 105 mmol/L (ref 97–108)
Creatinine, Ser: 2.4 mg/dL — ABNORMAL HIGH (ref 0.57–1.00)
GFR calc Af Amer: 20 mL/min/{1.73_m2} — ABNORMAL LOW (ref >59)
GFR calc non Af Amer: 18 mL/min/{1.73_m2} — ABNORMAL LOW (ref >59)
Glucose: 90 mg/dL (ref 65–99)
POTASSIUM: 4.6 mmol/L (ref 3.5–5.2)
Sodium: 144 mmol/L (ref 134–144)

## 2015-07-17 LAB — VITAMIN D 25 HYDROXY (VIT D DEFICIENCY, FRACTURES): Vit D, 25-Hydroxy: 24.4 ng/mL — ABNORMAL LOW (ref 30.0–100.0)

## 2015-07-23 ENCOUNTER — Ambulatory Visit (INDEPENDENT_AMBULATORY_CARE_PROVIDER_SITE_OTHER): Payer: Medicare Other | Admitting: Family Medicine

## 2015-07-23 ENCOUNTER — Ambulatory Visit (INDEPENDENT_AMBULATORY_CARE_PROVIDER_SITE_OTHER): Payer: Medicare Other

## 2015-07-23 ENCOUNTER — Encounter: Payer: Self-pay | Admitting: Family Medicine

## 2015-07-23 VITALS — BP 150/57 | HR 57 | Temp 97.4°F | Ht 63.0 in | Wt 139.0 lb

## 2015-07-23 DIAGNOSIS — R748 Abnormal levels of other serum enzymes: Secondary | ICD-10-CM | POA: Diagnosis not present

## 2015-07-23 DIAGNOSIS — E785 Hyperlipidemia, unspecified: Secondary | ICD-10-CM

## 2015-07-23 DIAGNOSIS — L209 Atopic dermatitis, unspecified: Secondary | ICD-10-CM

## 2015-07-23 DIAGNOSIS — I10 Essential (primary) hypertension: Secondary | ICD-10-CM

## 2015-07-23 DIAGNOSIS — M25511 Pain in right shoulder: Secondary | ICD-10-CM

## 2015-07-23 DIAGNOSIS — E559 Vitamin D deficiency, unspecified: Secondary | ICD-10-CM

## 2015-07-23 DIAGNOSIS — I82403 Acute embolism and thrombosis of unspecified deep veins of lower extremity, bilateral: Secondary | ICD-10-CM

## 2015-07-23 DIAGNOSIS — N184 Chronic kidney disease, stage 4 (severe): Secondary | ICD-10-CM | POA: Diagnosis not present

## 2015-07-23 DIAGNOSIS — R7989 Other specified abnormal findings of blood chemistry: Secondary | ICD-10-CM

## 2015-07-23 LAB — POCT INR: INR: 3

## 2015-07-23 NOTE — Progress Notes (Signed)
Subjective:    Patient ID: Elizabeth Burch, female    DOB: 04-18-1926, 81 y.o.   MRN: [id number]  HPI Pt here for follow up and management of chronic medical problems which includes hypertension and hyperlipidemia. She is taking medications regularly. This patient is seen regularly by the cardiologist and nephrologist the rheumatologist and also the dermatologist. She is currently using clobetasol cream on the dermatitis of her lower legs. She is also complaining of some right shoulder pain. She has had her lab work this will be reviewed with her during the visit. The patient is pleasant and alert and is usual has no complaints other than the dermatitis on her legs and her right shoulder pain. Her recent lab work was reviewed with her specifically the rising creatinine. Her cholesterol numbers were good, her CBC was stable, and her liver function tests were normal. She describes no injury with her shoulder. She is not sure when her next appointment is with the nephrologist. The pain in her right shoulder is generalized and just has become more constant recently.       Patient Active Problem List   Diagnosis Date Noted  . Statin intolerance 03/19/2015  . Cardiomegaly 03/19/2015  . Osteoporosis 12/24/2013  . Kyphosis 11/03/2013  . Acute venous embolism and thrombosis of deep vessels of distal lower extremity 08/26/2013  . 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 Fibrocystic disease of right breast 05/22/2013  . DVT (deep venous thrombosis) 02/16/2013  . Leg edema, left 09/16/2012  . Gout   . CKD (chronic kidney disease) 01/08/2012  . Multinodular thyroid 10/25/2011  . Fibrocystic breast disease, left.   . Open-angle glaucoma   . Hyperlipidemia 01/06/2009  . Essential hypertension 01/06/2009   Outpatient Encounter Prescriptions as of 07/23/2015  Medication Sig  . amLODipine (NORVASC) 10 MG tablet Take 1 tablet (10 mg total) by mouth daily.  . calcitRIOL (ROCALTROL) 0.25  MCG capsule Take 0.25 mcg by mouth daily.  . colchicine (COLCRYS) 0.6 MG tablet Take 1 tablet (0.6 mg total) by mouth 2 (two) times daily as needed.  . dorzolamide-timolol (COSOPT) 22.3-6.8 MG/ML ophthalmic solution Place 1 drop into both eyes 2 (two) times daily.   . febuxostat (ULORIC) 40 MG tablet Take 1 tablet (40 mg total) by mouth daily.  . furosemide (LASIX) 40 MG tablet Take 40 mg by mouth daily.   Marland Kitchen latanoprost (XALATAN) 0.005 % ophthalmic solution Place 1 drop into both eyes at bedtime.   Marland Kitchen losartan (COZAAR) 100 MG tablet TAKE ONE TABLET BY MOUTH ONCE DAILY  . metoprolol succinate (TOPROL-XL) 25 MG 24 hr tablet TAKE ONE TABLET BY MOUTH ONCE DAILY  . Omega-3 Fatty Acids (FISH OIL) 1000 MG CAPS Take 1,000 mg by mouth daily.  . pantoprazole (PROTONIX) 40 MG tablet Take 1 tablet by mouth every other day.  . warfarin (COUMADIN) 2 MG tablet Take 1 and 1/2 tablets by mouth once daily or as directed by anticoagulation clinic (Patient taking differently: Take 1 and 1/2 tablets by mouth once daily except Friday take 1 tablet, as directed by anticoagulation clinic)   No facility-administered encounter medications on file as of 07/23/2015.     Review of Systems  Constitutional: Negative.   HENT: Negative.   Eyes: Negative.   Respiratory: Negative.   Cardiovascular: Negative.   Gastrointestinal: Negative.   Endocrine: Negative.   Genitourinary: Negative.   Musculoskeletal: Positive for arthralgias (right shoulder pain).  Skin: Negative.  Recent visit with DERM - Amy Martinique - recheck legs today  Allergic/Immunologic: Negative.   Neurological: Negative.   Hematological: Negative.   Psychiatric/Behavioral: Negative.        Objective:   Physical Exam  Constitutional: She is oriented to person, place, and time. She appears well-developed and well-nourished. No distress.  Relaxed and alert  HENT:  Head: Normocephalic and atraumatic.  Right Ear: External ear normal.  Left Ear:  External ear normal.  Nose: Nose normal.  Mouth/Throat: Oropharynx is clear and moist.  Eyes: Conjunctivae and EOM are normal. Pupils are equal, round, and reactive to light. Right eye exhibits no discharge. Left eye exhibits no discharge. No scleral icterus.  Neck: Normal range of motion. Neck supple. No thyromegaly present.  No bruits or thyromegaly  Cardiovascular: Normal rate, regular rhythm, normal heart sounds and intact distal pulses.   No murmur heard. At 72/m  Pulmonary/Chest: Effort normal and breath sounds normal. No respiratory distress. She has no wheezes. She has no rales. She exhibits no tenderness.  Clear anteriorly and posteriorly  Abdominal: Soft. Bowel sounds are normal. She exhibits no mass. There is no tenderness. There is no rebound and no guarding.  Abdomen nontender without masses or organ enlargement  Musculoskeletal: Normal range of motion. She exhibits edema. She exhibits no tenderness.  There is some mild calf edema and anterior shin edema without ankle edema.  Lymphadenopathy:    She has no cervical adenopathy.  Neurological: She is alert and oriented to person, place, and time. She has normal reflexes. No cranial nerve deficit.  Skin: Skin is warm and dry. Rash noted. No erythema.  There is a very faint rash on both lower extremities but nothing to what it has been in the past and she will continue with her clobetasol cream.  Psychiatric: She has a normal mood and affect. Her behavior is normal. Judgment and thought content normal.  Nursing note and vitals reviewed.   BP 150/57 mmHg  Pulse 57  Temp(Src) 97.4 F (36.3 C) (Oral)  Ht _0  (1.6 m)  Wt 139 lb (63.05 kg)  BMI 24.63 kg/m2  [facility] reading (PRIMARY) by  Dr.Parks Czajkowski-right shoulder and cervical spine--   the patient left before the films were able to be reviewed.                                    Assessment & Plan:  1. Hyperlipidemia -The patient is statin intolerant and her most recent  cholesterol numbers were good.  2. Essential hypertension -The blood pressure has a systolic reading of the 888 today with a diastolic of 57 and we will discuss her blood pressure management with the nephrologist because of her rising creatinine.  3. Chronic kidney disease (CKD), stage IV (severe) -Follow-up with nephrology as planned  4. Vitamin D deficiency -Follow-up with nephrology as planned  5. DVT (deep venous thrombosis), bilateral -Continue Coumadin as doing - POCT INR  6. Right shoulder pain -Consider physical therapy for neck and shoulder if x-rays do not show any severe abnormality - DG Cervical Spine Complete; Future - DG Shoulder Right; Future  7. Creatinine elevation -We will repeat the BMP for confirmation of the high creatinine - BMP8+EGFR  8. Atopic dermatitis -Continue clobetasol cream per direction of dermatology  Patient Instructions  Medicare Annual Wellness Visit  [facility] and the medical providers at [facility] strive to bring you the best medical care.  In doing so we not only want to address your current medical conditions and concerns but also to detect new conditions early and prevent illness, disease and health-related problems.    Medicare offers a yearly Wellness Visit which allows our clinical staff to assess your need for preventative services including immunizations, lifestyle education, counseling to decrease risk of preventable diseases and screening for fall risk and other medical concerns.    This visit is provided free of charge (no copay) for all Medicare recipients. The clinical pharmacists at [facility] have begun to conduct these Wellness Visits which will also include a thorough review of all your medications.    As you primary medical provider recommend that you make an appointment for your Annual Wellness Visit if you have not done so already this year.  You may  set up this appointment before you leave today or you may call back ([phone]) and schedule an appointment.  Please make sure when you call that you mention that you are scheduling your Annual Wellness Visit with the clinical pharmacist so that the appointment may be made for the proper length of time.     Continue current medications. Continue good therapeutic lifestyle changes which include good diet and exercise. Fall precautions discussed with patient. If an FOBT was given today- please return it to our front desk. If you are over 55 years old - you may need Prevnar 38 or the adult Pneumonia vaccine.  **Flu shots will be available soon--- please call and schedule a FLU-CLINIC appointment**  After your visit with [place] today you will receive a survey in the mail or online from [employer] regarding your care with [place]. Please take a moment to fill this out. Your feedback is very important to [place] as you can help [place] better understand your patient needs as well as improve your experience and satisfaction. WE CARE ABOUT YOU!!!   **Please join [place] SEPT.[email or web address], 2016 from 5:00 to 7:00pm for our OPEN HOUSE! Come out and meet our NEW providers**   Anticoagulation Dose Instructions as of 07/23/2015      [name] Tue Wed Thu Fri Sat   New Dose 3 _0  mg 3 mg    Description        Continue warfarin 21m - take 1 and 1/2 tablets daily      INR was 3.0 today  Keep follow-up appointments with cardiology dermatology nephrology and rheumatology We will talk to the nephrologist regarding your rising creatinine and get a sooner appointment with him as well as discuss other ways of controlling the blood pressure per his recommendations. Usual her left arm greater than the right arm to rest the shoulder We will call you with the x-ray results once these become available Watch sodium intake Elevate her feet higher than the level of the floor for 20-30 minutes every afternoon We will consider  physical therapy for the shoulder if the x-rays of the neck and shoulder look stable. Continue current treatment for legs   DArrie SenateMD

## 2015-07-23 NOTE — Patient Instructions (Addendum)
Medicare Annual Wellness Visit  [facility] and the medical providers at [facility] strive to bring you the best medical care.  In doing so we not only want to address your current medical conditions and concerns but also to detect new conditions early and prevent illness, disease and health-related problems.    Medicare offers a yearly Wellness Visit which allows our clinical staff to assess your need for preventative services including immunizations, lifestyle education, counseling to decrease risk of preventable diseases and screening for fall risk and other medical concerns.    This visit is provided free of charge (no copay) for all Medicare recipients. The clinical pharmacists at [facility] have begun to conduct these Wellness Visits which will also include a thorough review of all your medications.    As you primary medical provider recommend that you make an appointment for your Annual Wellness Visit if you have not done so already this year.  You may set up this appointment before you leave today or you may call back [id number] and schedule an appointment.  Please make sure when you call that you mention that you are scheduling your Annual Wellness Visit with the clinical pharmacist so that the appointment may be made for the proper length of time.     Continue current medications. Continue good therapeutic lifestyle changes which include good diet and exercise. Fall precautions discussed with patient. If an FOBT was given today- please return it to our front desk. If you are over 29 years old - you may need Prevnar 46 or the adult Pneumonia vaccine.  **Flu shots will be available soon--- please call and schedule a FLU-CLINIC appointment**  After your visit with [place] today you will receive a survey in the mail or online from [employer] regarding your care with [place]. Please take a moment to fill this out. Your feedback is  very important to [place] as you can help [place] better understand your patient needs as well as improve your experience and satisfaction. WE CARE ABOUT YOU!!!   **Please join [place] SEPT.[id number], 2016 from 5:00 to 7:00pm for our OPEN HOUSE! Come out and meet our NEW providers**   Anticoagulation Dose Instructions as of 07/23/2015      [name] Tue Wed Thu Fri Sat   New Dose 3 mg 3 mg 3 mg 3 mg 3 mg 3 mg 3 mg    Description        Continue warfarin 2mg  - take 1 and 1/2 tablets daily      INR was 3.0 today  Keep follow-up appointments with cardiology dermatology nephrology and rheumatology We will talk to the nephrologist regarding your rising creatinine and get a sooner appointment with him as well as discuss other ways of controlling the blood pressure per his recommendations. Usual her left arm greater than the right arm to rest the shoulder We will call you with the x-ray results once these become available Watch sodium intake Elevate her feet higher than the level of the floor for 20-30 minutes every afternoon We will consider physical therapy for the shoulder if the x-rays of the neck and shoulder look stable. Continue current treatment for legs

## 2015-07-24 LAB — BMP8+EGFR
BUN / CREAT RATIO: 22 (ref 11–26)
BUN: 42 mg/dL — ABNORMAL HIGH (ref 8–27)
CALCIUM: 9.8 mg/dL (ref 8.7–10.3)
CHLORIDE: 105 mmol/L (ref 97–108)
CO2: 19 mmol/L (ref 18–29)
Creatinine, Ser: 1.95 mg/dL — ABNORMAL HIGH (ref 0.57–1.00)
GFR calc non Af Amer: 23 mL/min/{1.73_m2} — ABNORMAL LOW (ref >59)
GFR, EST AFRICAN AMERICAN: 26 mL/min/{1.73_m2} — AB (ref >59)
Glucose: 94 mg/dL (ref 65–99)
POTASSIUM: 4.6 mmol/L (ref 3.5–5.2)
Sodium: 143 mmol/L (ref 134–144)

## 2015-08-23 ENCOUNTER — Ambulatory Visit (INDEPENDENT_AMBULATORY_CARE_PROVIDER_SITE_OTHER): Payer: Medicare Other | Admitting: Pharmacist

## 2015-08-23 DIAGNOSIS — I824Z9 Acute embolism and thrombosis of unspecified deep veins of unspecified distal lower extremity: Secondary | ICD-10-CM | POA: Diagnosis not present

## 2015-08-23 LAB — POCT INR: INR: 2.5

## 2015-08-23 NOTE — Patient Instructions (Signed)
Anticoagulation Dose Instructions as of 08/23/2015      Elizabeth Burch Tue Wed Thu Fri Sat   New Dose 3 mg 3 mg 3 mg 3 mg 3 mg 3 mg 3 mg    Description        Continue warfarin 2mg  - take 1 and 1/2 tablets daily      INR was 2.5 today

## 2015-09-01 ENCOUNTER — Other Ambulatory Visit: Payer: Self-pay | Admitting: Family Medicine

## 2015-09-08 ENCOUNTER — Other Ambulatory Visit: Payer: Self-pay | Admitting: Cardiology

## 2015-09-22 ENCOUNTER — Ambulatory Visit (INDEPENDENT_AMBULATORY_CARE_PROVIDER_SITE_OTHER): Payer: Medicare Other | Admitting: Pharmacist

## 2015-09-22 DIAGNOSIS — I82403 Acute embolism and thrombosis of unspecified deep veins of lower extremity, bilateral: Secondary | ICD-10-CM | POA: Diagnosis not present

## 2015-09-22 LAB — POCT INR: INR: 3.6

## 2015-09-22 NOTE — Patient Instructions (Signed)
Anticoagulation Dose Instructions as of 09/22/2015      Elizabeth Burch Tue Wed Thu Fri Sat   New Dose 3 mg 3 mg 3 mg 3 mg 3 mg 2 mg 3 mg    Description        No warfarin today - Wednesday, October 19th, then decrease dose to  warfarin 2mg  - take 1 tablet on Fridays and 1 and 1/2 (= 3mg ) tablets all other days.     INR was 3.6 today

## 2015-09-29 ENCOUNTER — Other Ambulatory Visit: Payer: Self-pay | Admitting: Family Medicine

## 2015-10-06 ENCOUNTER — Ambulatory Visit (INDEPENDENT_AMBULATORY_CARE_PROVIDER_SITE_OTHER): Payer: Medicare Other | Admitting: Pharmacist

## 2015-10-06 DIAGNOSIS — I82403 Acute embolism and thrombosis of unspecified deep veins of lower extremity, bilateral: Secondary | ICD-10-CM | POA: Diagnosis not present

## 2015-10-06 LAB — POCT INR: INR: 2.6

## 2015-10-06 NOTE — Patient Instructions (Signed)
Anticoagulation Dose Instructions as of 10/06/2015      Elizabeth Burch Tue Wed Thu Fri Sat   New Dose 3 mg 3 mg 3 mg 3 mg 3 mg 2 mg 3 mg    Description        Continue to take warfarin 1 tablet on Fridays and 1 and 1/2 (= 3mg ) tablets all other days.

## 2015-10-26 ENCOUNTER — Telehealth: Payer: Self-pay | Admitting: Family Medicine

## 2015-10-26 DIAGNOSIS — M542 Cervicalgia: Secondary | ICD-10-CM

## 2015-10-26 DIAGNOSIS — M25519 Pain in unspecified shoulder: Secondary | ICD-10-CM

## 2015-10-26 NOTE — Telephone Encounter (Signed)
Patient aware that referral has been placed.  

## 2015-11-05 ENCOUNTER — Ambulatory Visit (INDEPENDENT_AMBULATORY_CARE_PROVIDER_SITE_OTHER): Payer: Medicare Other | Admitting: Pharmacist

## 2015-11-05 DIAGNOSIS — I824Y3 Acute embolism and thrombosis of unspecified deep veins of proximal lower extremity, bilateral: Secondary | ICD-10-CM | POA: Diagnosis not present

## 2015-11-05 DIAGNOSIS — I824Z3 Acute embolism and thrombosis of unspecified deep veins of distal lower extremity, bilateral: Secondary | ICD-10-CM

## 2015-11-05 LAB — POCT INR: INR: 3.1

## 2015-11-05 NOTE — Patient Instructions (Signed)
Anticoagulation Dose Instructions as of 11/05/2015      Elizabeth Burch Tue Wed Thu Fri Sat   New Dose 3 mg 3 mg 3 mg 3 mg 3 mg 2 mg 3 mg    Description        No warfarin today - Friday, December 2nd, then continue to take warfarin 1 tablet on Fridays and 1 and 1/2 (= 3mg ) tablets all other days. Try to have 1 serving of greens per week.      INR was 3.1 today

## 2015-11-26 ENCOUNTER — Other Ambulatory Visit: Payer: Self-pay

## 2015-11-26 MED ORDER — FEBUXOSTAT 40 MG PO TABS: 40.0000 mg | ORAL_TABLET | Freq: Every day | ORAL | Status: DC

## 2015-12-01 ENCOUNTER — Other Ambulatory Visit (INDEPENDENT_AMBULATORY_CARE_PROVIDER_SITE_OTHER): Payer: Medicare Other

## 2015-12-01 DIAGNOSIS — E785 Hyperlipidemia, unspecified: Secondary | ICD-10-CM | POA: Diagnosis not present

## 2015-12-01 DIAGNOSIS — I10 Essential (primary) hypertension: Secondary | ICD-10-CM

## 2015-12-01 DIAGNOSIS — E559 Vitamin D deficiency, unspecified: Secondary | ICD-10-CM

## 2015-12-01 NOTE — Progress Notes (Signed)
Lab only 

## 2015-12-02 LAB — BMP8+EGFR
BUN / CREAT RATIO: 23 (ref 11–26)
BUN: 34 mg/dL — ABNORMAL HIGH (ref 8–27)
CALCIUM: 10.4 mg/dL — AB (ref 8.7–10.3)
CHLORIDE: 106 mmol/L (ref 96–106)
CO2: 24 mmol/L (ref 18–29)
Creatinine, Ser: 1.45 mg/dL — ABNORMAL HIGH (ref 0.57–1.00)
GFR, EST AFRICAN AMERICAN: 37 mL/min/{1.73_m2} — AB (ref >59)
GFR, EST NON AFRICAN AMERICAN: 32 mL/min/{1.73_m2} — AB (ref >59)
Glucose: 83 mg/dL (ref 65–99)
POTASSIUM: 4.5 mmol/L (ref 3.5–5.2)
SODIUM: 145 mmol/L — AB (ref 134–144)

## 2015-12-02 LAB — VITAMIN D 25 HYDROXY (VIT D DEFICIENCY, FRACTURES): VIT D 25 HYDROXY: 24.4 ng/mL — AB (ref 30.0–100.0)

## 2015-12-02 LAB — HEPATIC FUNCTION PANEL
ALT: 19 IU/L (ref 0–32)
AST: 23 IU/L (ref 0–40)
Albumin: 4.1 g/dL (ref 3.5–4.7)
Alkaline Phosphatase: 86 IU/L (ref 39–117)
BILIRUBIN TOTAL: 0.3 mg/dL (ref 0.0–1.2)
Bilirubin, Direct: 0.09 mg/dL (ref 0.00–0.40)
Total Protein: 5.9 g/dL — ABNORMAL LOW (ref 6.0–8.5)

## 2015-12-02 LAB — NMR, LIPOPROFILE
Cholesterol: 249 mg/dL — ABNORMAL HIGH (ref 100–199)
HDL CHOLESTEROL BY NMR: 94 mg/dL (ref >39)
HDL Particle Number: 52.8 umol/L (ref >=30.5)
LDL PARTICLE NUMBER: 1687 nmol/L — AB (ref <1000)
LDL SIZE: 20.8 nm (ref >20.5)
LDL-C: 138 mg/dL — ABNORMAL HIGH (ref 0–99)
LP-IR Score: <25 (ref <=45)
SMALL LDL PARTICLE NUMBER: 675 nmol/L — AB (ref <=527)
TRIGLYCERIDES BY NMR: 84 mg/dL (ref 0–149)

## 2015-12-02 LAB — CBC WITH DIFFERENTIAL/PLATELET
BASOS: 0 %
Basophils Absolute: 0 10*3/uL (ref 0.0–0.2)
EOS (ABSOLUTE): 0.3 10*3/uL (ref 0.0–0.4)
EOS: 4 %
HEMATOCRIT: 34 % (ref 34.0–46.6)
Hemoglobin: 11.1 g/dL (ref 11.1–15.9)
IMMATURE GRANULOCYTES: 0 %
Immature Grans (Abs): 0 10*3/uL (ref 0.0–0.1)
LYMPHS ABS: 1.9 10*3/uL (ref 0.7–3.1)
Lymphs: 25 %
MCH: 31 pg (ref 26.6–33.0)
MCHC: 32.6 g/dL (ref 31.5–35.7)
MCV: 95 fL (ref 79–97)
MONOS ABS: 0.6 10*3/uL (ref 0.1–0.9)
Monocytes: 8 %
NEUTROS ABS: 4.9 10*3/uL (ref 1.4–7.0)
NEUTROS PCT: 63 %
PLATELETS: 270 10*3/uL (ref 150–379)
RBC: 3.58 x10E6/uL — ABNORMAL LOW (ref 3.77–5.28)
RDW: 13.6 % (ref 12.3–15.4)
WBC: 7.8 10*3/uL (ref 3.4–10.8)

## 2015-12-08 ENCOUNTER — Ambulatory Visit (INDEPENDENT_AMBULATORY_CARE_PROVIDER_SITE_OTHER): Payer: Medicare Other | Admitting: Family Medicine

## 2015-12-08 ENCOUNTER — Encounter: Payer: Self-pay | Admitting: Family Medicine

## 2015-12-08 VITALS — BP 157/62 | HR 65 | Temp 97.0°F | Ht 63.0 in | Wt 142.0 lb

## 2015-12-08 DIAGNOSIS — E559 Vitamin D deficiency, unspecified: Secondary | ICD-10-CM | POA: Diagnosis not present

## 2015-12-08 DIAGNOSIS — N184 Chronic kidney disease, stage 4 (severe): Secondary | ICD-10-CM

## 2015-12-08 DIAGNOSIS — I517 Cardiomegaly: Secondary | ICD-10-CM

## 2015-12-08 DIAGNOSIS — I82403 Acute embolism and thrombosis of unspecified deep veins of lower extremity, bilateral: Secondary | ICD-10-CM | POA: Diagnosis not present

## 2015-12-08 DIAGNOSIS — I10 Essential (primary) hypertension: Secondary | ICD-10-CM

## 2015-12-08 DIAGNOSIS — Z789 Other specified health status: Secondary | ICD-10-CM

## 2015-12-08 DIAGNOSIS — Z889 Allergy status to unspecified drugs, medicaments and biological substances status: Secondary | ICD-10-CM | POA: Diagnosis not present

## 2015-12-08 DIAGNOSIS — E785 Hyperlipidemia, unspecified: Secondary | ICD-10-CM | POA: Diagnosis not present

## 2015-12-08 LAB — POCT INR: INR: 1.8

## 2015-12-08 MED ORDER — FEBUXOSTAT 40 MG PO TABS: 40.0000 mg | ORAL_TABLET | Freq: Every day | ORAL | Status: DC

## 2015-12-08 NOTE — Patient Instructions (Addendum)
Medicare Annual Wellness Visit  [facility] and the medical providers at [facility] strive to bring you the best medical care.  In doing so we not only want to address your current medical conditions and concerns but also to detect new conditions early and prevent illness, disease and health-related problems.    Medicare offers a yearly Wellness Visit which allows our clinical staff to assess your need for preventative services including immunizations, lifestyle education, counseling to decrease risk of preventable diseases and screening for fall risk and other medical concerns.    This visit is provided free of charge (no copay) for all Medicare recipients. The clinical pharmacists at [facility] have begun to conduct these Wellness Visits which will also include a thorough review of all your medications.    As you primary medical provider recommend that you make an appointment for your Annual Wellness Visit if you have not done so already this year.  You may set up this appointment before you leave today or you may call back [phone] and schedule an appointment.  Please make sure when you call that you mention that you are scheduling your Annual Wellness Visit with the clinical pharmacist so that the appointment may be made for the proper length of time.     Continue current medications. Continue good therapeutic lifestyle changes which include good diet and exercise. Fall precautions discussed with patient. If an FOBT was given today- please return it to our front desk. If you are over 52 years old - you may need Prevnar 14 or the adult Pneumonia vaccine.  **Flu shots are available--- please call and schedule a FLU-CLINIC appointment**  After your visit with [place] today you will receive a survey in the mail or online from [employer] regarding your care with [place]. Please take a moment to fill this out. Your feedback is very  important to [place] as you can help [place] better understand your patient needs as well as improve your experience and satisfaction. WE CARE ABOUT YOU!!!   Anticoagulation Dose Instructions as of 12/08/2015      [name] Tue Wed Thu Fri Sat   New Dose 3 mg 3 mg 3 mg 3 mg 3 mg 2 mg 3 mg    Description        Take 2 tablets of warfarin 2mg  today, then Continue to take warfarin 1 tablet on Fridays and 1 and 1/2 (= 3mg ) tablets all other days. Try to have 1 serving of greens per week.      INR was 1.8 today   the patient should continue with her current medicines  She should watch her sodium intake more closely  she should bring blood pressures in for review in a couple weeks  she should stay active physically and not put herself at risk for falling and avoid climbing  she should continue to follow-up with the cardiologist, the nephrologist, and the rheumatologist.

## 2015-12-08 NOTE — Progress Notes (Signed)
Subjective:    Patient ID: LIMMIE IMM, female    DOB: 05/10/1926, 82 y.o.   MRN: [id number]  HPI Pt here for follow up and management of chronic medical problems which includes hypertension and hyperlipidemia. She is taking medications regularly. This patient sees several specialists. She sees the cardiologist, the rheumatologist because of gout, the nephrologist because of chronic kidney disease. She sees them regularly. She has had recent lab work. This will be reviewed with her during the visit today. The CBC has a normal white blood cell count. Hemoglobin is good at 11.1 and this is stable and consistent with past readings. The platelet count is adequate. The blood sugar is good at 83. The creatinine, the most important kidney function test continues to improve though it still remains elevated and this is good. Current creatinine value is 1.45 previously it was 1.95. The electrolytes including potassium are good except the sodium is slightly elevated and the serum calcium is slightly elevated and we will continue to monitor these. 1 liver function tests is slightly decreased but this is not of concern as all other tests are within normal limits Cholesterol numbers with advanced lipid testing are more elevated than in the past with a total LDL particle in 1687. The LDL C is also elevated at 138. The triglycerides are good at 84 and the good cholesterol or the HDL particle number remains good. This is the first time the cholesterol numbers have been this elevated. The patient must try to do better with her diet and exercise regimen. She should continue with the Fish oil. We will recheck this again in 3-4 months and if she is not better consider starting a low-dose statin drug at that time The vitamin D level remains low at 24.4 and because of her elevated creatinine we cannot add any additional vitamin D treatment regimen  the patient has no complaints. She is pleasant and alert an independent.  She denies chest pain shortness of breath trouble swallowing heartburn indigestion nausea vomiting diarrhea or blood in the stool. She is passing her water without problems. She keeps up with all of her appointments with her specialist. She is due for her next mammogram in May and we will get that done and check her breasts at the same time in May 2017. She will continue to check her breasts regularly. There is a strong family history of breast cancer.    Patient Active Problem List   Diagnosis Date Noted  . Statin intolerance 03/19/2015  . Cardiomegaly 03/19/2015  . Osteoporosis 12/24/2013  . Kyphosis 11/03/2013  . Acute venous embolism and thrombosis of deep vessels of distal lower extremity ([facility]) 08/26/2013  . 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 Fibrocystic disease of right breast 05/22/2013  . DVT of leg (deep venous thrombosis) ([facility]) 02/16/2013  . Leg edema, left 09/16/2012  . Gout   . CKD (chronic kidney disease) 01/08/2012  . Multinodular thyroid 10/25/2011  . Fibrocystic breast disease, left.   . Open-angle glaucoma   . Hyperlipidemia 01/06/2009  . Essential hypertension 01/06/2009   Outpatient Encounter Prescriptions as of 12/08/2015  Medication Sig  . amLODipine (NORVASC) 10 MG tablet Take 1 tablet (10 mg total) by mouth daily.  . calcitRIOL (ROCALTROL) 0.25 MCG capsule Take 0.25 mcg by mouth daily. Three times weekly  . colchicine (COLCRYS) 0.6 MG tablet Take 1 tablet (0.6 mg total) by mouth 2 (two) times daily as needed.  . dorzolamide-timolol (COSOPT) 22.3-6.8 MG/ML ophthalmic  solution Place 1 drop into both eyes 2 (two) times daily.   . febuxostat (ULORIC) 40 MG tablet Take 1 tablet (40 mg total) by mouth daily.  . furosemide (LASIX) 40 MG tablet Take 40 mg by mouth daily.   Marland Kitchen losartan (COZAAR) 100 MG tablet TAKE ONE TABLET BY MOUTH ONCE DAILY  . metoprolol succinate (TOPROL-XL) 25 MG 24 hr tablet TAKE ONE TABLET BY MOUTH ONCE DAILY  . Omega-3 Fatty Acids  (FISH OIL) 1000 MG CAPS Take 1,000 mg by mouth daily.  . pantoprazole (PROTONIX) 40 MG tablet TAKE ONE TABLET BY MOUTH ONCE DAILY  . TRAVATAN Z 0.004 % SOLN ophthalmic solution Place 1 drop into both eyes daily.  Marland Kitchen warfarin (COUMADIN) 2 MG tablet Take 1 and 1/2 tablets by mouth once daily or as directed by anticoagulation clinic (Patient taking differently: Take 1 and 1/2 tablets by mouth once daily except Friday take 1 tablet, as directed by anticoagulation clinic)   No facility-administered encounter medications on file as of 12/08/2015.     Review of Systems  Constitutional: Negative.   HENT: Negative.   Eyes: Negative.   Respiratory: Negative.   Cardiovascular: Negative.   Gastrointestinal: Negative.   Endocrine: Negative.   Genitourinary: Negative.   Musculoskeletal: Negative.   Skin: Negative.   Allergic/Immunologic: Negative.   Neurological: Negative.   Hematological: Negative.   Psychiatric/Behavioral: Negative.        Objective:   Physical Exam  Constitutional: She is oriented to person, place, and time. She appears well-developed and well-nourished. No distress.  HENT:  Head: Normocephalic and atraumatic.  Right Ear: External ear normal.  Left Ear: External ear normal.  Mouth/Throat: Oropharynx is clear and moist.  Minimal nasal congestion bilaterally  Eyes: Conjunctivae and EOM are normal. Pupils are equal, round, and reactive to light. Right eye exhibits no discharge. Left eye exhibits no discharge. No scleral icterus.  Her eyes are checked every 6 months by her ophthalmologist  Neck: Normal range of motion. Neck supple. No thyromegaly present.  No bruits adenopathy or thyromegaly  Cardiovascular: Normal rate, regular rhythm, normal heart sounds and intact distal pulses.   No murmur heard. The heart has a regular rate and rhythm at 84/m  Pulmonary/Chest: Effort normal and breath sounds normal. No respiratory distress. She has no wheezes. She has no rales. She  exhibits no tenderness.  Clear anteriorly and posteriorly  Abdominal: Soft. Bowel sounds are normal. She exhibits no mass. There is no tenderness. There is no rebound and no guarding.  No abdominal tenderness masses or bruits  Musculoskeletal: Normal range of motion. She exhibits no edema.  The patient wears heavy-duty support hose and there is no edema  Lymphadenopathy:    She has no cervical adenopathy.  Neurological: She is alert and oriented to person, place, and time. She has normal reflexes. No cranial nerve deficit.  Skin: Skin is warm and dry. No rash noted.  Psychiatric: She has a normal mood and affect. Her behavior is normal. Judgment and thought content normal.  The patient's mood is positive and upbeat as usual  Nursing note and vitals reviewed.  BP 157/62 mmHg  Pulse 65  Temp(Src) 97 F (36.1 C) (Oral)  Ht 5\' 3"  (1.6 m)  Wt 142 lb (64.411 kg)  BMI 25.16 kg/m2       Assessment & Plan:  1. DVT (deep venous thrombosis), bilateral -The patient will follow the Coumadin directions and the patient instruction note - POCT INR  2. Hyperlipidemia -Continue  to try to do better with diet and watch intake of food and get as much exercise as possible because she is statin intolerant  3. Essential hypertension -She will watch her sodium intake more closely and she will bring blood pressure readings by the office in a couple weeks for review from home  4. Chronic kidney disease (CKD), stage IV (severe) (HCC) -Continue to follow-up with nephrologist  5. Vitamin D deficiency -No change in vitamin D treatment because of increased creatinine  6. Statin intolerance -Work with diet and exercise as much as possible  7. CKD (chronic kidney disease), stage 4 (severe) (HCC) -Creatinine is actually improved over 2 different checks and this is good. She will continue to work on keeping her blood pressure under the best control possible and watching all NSAIDs and follow-up with  nephrologist as planned  8. Cardiomegaly -Follow-up with cardiology as planned  Meds ordered this encounter  Medications  . febuxostat (ULORIC) 40 MG tablet    Sig: Take 1 tablet (40 mg total) by mouth daily.    Dispense:  30 tablet    Refill:  4   Patient Instructions                        Medicare Annual Wellness Visit  [facility] and the medical providers at [facility] strive to bring you the best medical care.  In doing so we not only want to address your current medical conditions and concerns but also to detect new conditions early and prevent illness, disease and health-related problems.    Medicare offers a yearly Wellness Visit which allows our clinical staff to assess your need for preventative services including immunizations, lifestyle education, counseling to decrease risk of preventable diseases and screening for fall risk and other medical concerns.    This visit is provided free of charge (no copay) for all Medicare recipients. The clinical pharmacists at [facility] have begun to conduct these Wellness Visits which will also include a thorough review of all your medications.    As you primary medical provider recommend that you make an appointment for your Annual Wellness Visit if you have not done so already this year.  You may set up this appointment before you leave today or you may call back [phone] and schedule an appointment.  Please make sure when you call that you mention that you are scheduling your Annual Wellness Visit with the clinical pharmacist so that the appointment may be made for the proper length of time.     Continue current medications. Continue good therapeutic lifestyle changes which include good diet and exercise. Fall precautions discussed with patient. If an FOBT was given today- please return it to our front desk. If you are over 106 years old - you may need Prevnar 35 or the adult Pneumonia  vaccine.  **Flu shots are available--- please call and schedule a FLU-CLINIC appointment**  After your visit with [place] today you will receive a survey in the mail or online from [employer] regarding your care with [place]. Please take a moment to fill this out. Your feedback is very important to [place] as you can help [place] better understand your patient needs as well as improve your experience and satisfaction. WE CARE ABOUT YOU!!!   Anticoagulation Dose Instructions as of 12/08/2015      [name] Tue Wed Thu Fri Sat   New Dose 3 mg 3 mg 3 mg  3 mg 3 mg 2 mg 3 mg    Description        Take 2 tablets of warfarin 2mg  today, then Continue to take warfarin 1 tablet on Fridays and 1 and 1/2 (= 3mg ) tablets all other days. Try to have 1 serving of greens per week.      INR was 1.8 today   the patient should continue with her current medicines  She should watch her sodium intake more closely  she should bring blood pressures in for review in a couple weeks  she should stay active physically and not put herself at risk for falling and avoid climbing  she should continue to follow-up with the cardiologist, the nephrologist, and the rheumatologist.   Arrie Senate MD

## 2016-01-12 ENCOUNTER — Ambulatory Visit (INDEPENDENT_AMBULATORY_CARE_PROVIDER_SITE_OTHER): Payer: Medicare Other | Admitting: Pharmacist

## 2016-01-12 DIAGNOSIS — Z86718 Personal history of other venous thrombosis and embolism: Secondary | ICD-10-CM

## 2016-01-12 DIAGNOSIS — Z7901 Long term (current) use of anticoagulants: Secondary | ICD-10-CM

## 2016-01-12 LAB — POCT INR: INR: 1.4

## 2016-01-12 NOTE — Patient Instructions (Signed)
Anticoagulation Dose Instructions as of 01/12/2016      Elizabeth Burch Tue Wed Thu Fri Sat   New Dose 3 mg 3 mg 3 mg 3 mg 3 mg 3 mg 3 mg    Description        Take 2 tablets of warfarin 2mg  today - Wednesday, February 8th.  Then increase dose to warfarin 2mg  take 1 and 1/2 tablets every day.     INR was 1.4 today

## 2016-01-24 ENCOUNTER — Ambulatory Visit (INDEPENDENT_AMBULATORY_CARE_PROVIDER_SITE_OTHER): Payer: Medicare Other | Admitting: Pharmacist

## 2016-01-24 DIAGNOSIS — I824Y3 Acute embolism and thrombosis of unspecified deep veins of proximal lower extremity, bilateral: Secondary | ICD-10-CM | POA: Diagnosis not present

## 2016-01-24 DIAGNOSIS — Z7901 Long term (current) use of anticoagulants: Secondary | ICD-10-CM

## 2016-01-24 LAB — POCT INR: INR: 1.7

## 2016-01-24 NOTE — Patient Instructions (Signed)
Anticoagulation Dose Instructions as of 01/24/2016      Elizabeth Burch Tue Wed Thu Fri Sat   New Dose 3 mg 4 mg 3 mg 3 mg 3 mg 3 mg 3 mg    Description        Increase to 2 tablets on Mondays and 1 and 1/2 tablets all other days.     INR was 1.7 today

## 2016-01-27 ENCOUNTER — Other Ambulatory Visit: Payer: Self-pay | Admitting: Family Medicine

## 2016-02-09 ENCOUNTER — Ambulatory Visit (INDEPENDENT_AMBULATORY_CARE_PROVIDER_SITE_OTHER): Payer: Medicare Other | Admitting: Pharmacist

## 2016-02-09 ENCOUNTER — Other Ambulatory Visit: Payer: Medicare Other

## 2016-02-09 VITALS — BP 152/68 | HR 60

## 2016-02-09 DIAGNOSIS — I824Y3 Acute embolism and thrombosis of unspecified deep veins of proximal lower extremity, bilateral: Secondary | ICD-10-CM | POA: Diagnosis not present

## 2016-02-09 DIAGNOSIS — Z7901 Long term (current) use of anticoagulants: Secondary | ICD-10-CM | POA: Diagnosis not present

## 2016-02-09 LAB — COAGUCHEK XS/INR WAIVED
INR: 2.3 — ABNORMAL HIGH (ref 0.9–1.1)
Prothrombin Time: 28 s

## 2016-02-09 NOTE — Patient Instructions (Signed)
Anticoagulation Dose Instructions as of 02/09/2016      Elizabeth Burch Tue Wed Thu Fri Sat   New Dose 3 mg 4 mg 3 mg 3 mg 3 mg 3 mg 3 mg    Description        Continue current warfarin 2mg  dose - take 2 tablets on Mondays and 1 and 1/2 tablets all other days.     INR was 2.3 today

## 2016-02-09 NOTE — Progress Notes (Signed)
Subjective:     Indication: DVT Bleeding signs/symptoms: None Thromboembolic signs/symptoms: None  Missed Coumadin doses: None Medication changes: no Dietary changes: no Bacterial/viral infection: no Other concerns: yes - elevated BP in office for last 3 visits.  Patient has been checking BP at home and has records on file. Most home BP readings were in normal range  The following portions of the patient's history were reviewed and updated as appropriate: allergies, current medications and problem list.    Objective:    INR Today: 2.3  Current dose: warfarin 2mg  - take 2 tablets = 4mg  on mondays and 1 and 1/2 = 3mg  all other days.  Assessment:    Therapeutic INR for goal of 2-3   White coat HTN  Plan:    1. New dose: no change   2. Next INR: 1 month   3.  Continue to check BP at home - recheck in 1 month.

## 2016-02-18 ENCOUNTER — Telehealth: Payer: Self-pay | Admitting: Family Medicine

## 2016-02-18 NOTE — Telephone Encounter (Signed)
Call about her husband

## 2016-02-29 ENCOUNTER — Other Ambulatory Visit: Payer: Self-pay | Admitting: Pharmacist

## 2016-03-10 ENCOUNTER — Other Ambulatory Visit: Payer: Self-pay

## 2016-03-10 DIAGNOSIS — Z1231 Encounter for screening mammogram for malignant neoplasm of breast: Secondary | ICD-10-CM

## 2016-03-20 ENCOUNTER — Ambulatory Visit (INDEPENDENT_AMBULATORY_CARE_PROVIDER_SITE_OTHER): Payer: Medicare Other | Admitting: Pharmacist

## 2016-03-20 ENCOUNTER — Encounter (INDEPENDENT_AMBULATORY_CARE_PROVIDER_SITE_OTHER): Payer: Self-pay

## 2016-03-20 DIAGNOSIS — I824Y3 Acute embolism and thrombosis of unspecified deep veins of proximal lower extremity, bilateral: Secondary | ICD-10-CM

## 2016-03-20 DIAGNOSIS — Z7901 Long term (current) use of anticoagulants: Secondary | ICD-10-CM | POA: Diagnosis not present

## 2016-03-20 LAB — COAGUCHEK XS/INR WAIVED
INR: 2.2 — ABNORMAL HIGH (ref 0.9–1.1)
Prothrombin Time: 26.6 s

## 2016-03-20 NOTE — Patient Instructions (Signed)
Anticoagulation Dose Instructions as of 03/20/2016      Elizabeth Burch Tue Wed Thu Fri Sat   New Dose 3 mg 4 mg 3 mg 3 mg 3 mg 3 mg 3 mg    Description        Continue current warfarin 2mg  dose - take 2 tablets on Mondays and 1 and 1/2 tablets all other days.     INR was 2.2 today

## 2016-04-13 ENCOUNTER — Other Ambulatory Visit: Payer: Self-pay | Admitting: Cardiology

## 2016-04-13 ENCOUNTER — Ambulatory Visit
Admission: RE | Admit: 2016-04-13 | Discharge: 2016-04-13 | Disposition: A | Discharge status: Home / Self Care | Payer: Medicare Other | Source: Ambulatory Visit

## 2016-04-13 DIAGNOSIS — Z1231 Encounter for screening mammogram for malignant neoplasm of breast: Secondary | ICD-10-CM

## 2016-04-17 ENCOUNTER — Other Ambulatory Visit: Payer: Medicare Other

## 2016-04-17 DIAGNOSIS — E785 Hyperlipidemia, unspecified: Secondary | ICD-10-CM

## 2016-04-17 DIAGNOSIS — I10 Essential (primary) hypertension: Secondary | ICD-10-CM

## 2016-04-17 DIAGNOSIS — E559 Vitamin D deficiency, unspecified: Secondary | ICD-10-CM

## 2016-04-17 DIAGNOSIS — N184 Chronic kidney disease, stage 4 (severe): Secondary | ICD-10-CM

## 2016-04-17 LAB — LIPID PANEL
HDL: 85 mg/dL — AB (ref 35–70)
LDL Cholesterol: 121 mg/dL

## 2016-04-18 LAB — NMR, LIPOPROFILE
Cholesterol: 233 mg/dL — ABNORMAL HIGH (ref 100–199)
HDL CHOLESTEROL BY NMR: 85 mg/dL (ref >39)
HDL PARTICLE NUMBER: 51.4 umol/L (ref >=30.5)
LDL Particle Number: 1730 nmol/L — ABNORMAL HIGH (ref <1000)
LDL Size: 20.7 nm (ref >20.5)
LDL-C: 121 mg/dL — ABNORMAL HIGH (ref 0–99)
SMALL LDL PARTICLE NUMBER: 810 nmol/L — AB (ref <=527)
Triglycerides by NMR: 135 mg/dL (ref 0–149)

## 2016-04-18 LAB — BMP8+EGFR
BUN/Creatinine Ratio: 26 (ref 12–28)
BUN: 46 mg/dL — AB (ref 8–27)
CHLORIDE: 104 mmol/L (ref 96–106)
CO2: 24 mmol/L (ref 18–29)
CREATININE: 1.79 mg/dL — AB (ref 0.57–1.00)
Calcium: 10.3 mg/dL (ref 8.7–10.3)
GFR calc Af Amer: 29 mL/min/{1.73_m2} — ABNORMAL LOW (ref >59)
GFR calc non Af Amer: 25 mL/min/{1.73_m2} — ABNORMAL LOW (ref >59)
GLUCOSE: 91 mg/dL (ref 65–99)
Potassium: 4.2 mmol/L (ref 3.5–5.2)
SODIUM: 143 mmol/L (ref 134–144)

## 2016-04-18 LAB — HEPATIC FUNCTION PANEL
ALBUMIN: 4.2 g/dL (ref 3.5–4.7)
ALK PHOS: 85 IU/L (ref 39–117)
ALT: 10 IU/L (ref 0–32)
AST: 17 IU/L (ref 0–40)
Bilirubin Total: 0.4 mg/dL (ref 0.0–1.2)
Bilirubin, Direct: 0.12 mg/dL (ref 0.00–0.40)
TOTAL PROTEIN: 6.2 g/dL (ref 6.0–8.5)

## 2016-04-18 LAB — CBC WITH DIFFERENTIAL/PLATELET
BASOS ABS: 0 10*3/uL (ref 0.0–0.2)
Basos: 0 %
EOS (ABSOLUTE): 0.2 10*3/uL (ref 0.0–0.4)
EOS: 2 %
HEMATOCRIT: 35.7 % (ref 34.0–46.6)
Hemoglobin: 11.6 g/dL (ref 11.1–15.9)
IMMATURE GRANULOCYTES: 0 %
Immature Grans (Abs): 0 10*3/uL (ref 0.0–0.1)
Lymphocytes Absolute: 1.9 10*3/uL (ref 0.7–3.1)
Lymphs: 21 %
MCH: 30.6 pg (ref 26.6–33.0)
MCHC: 32.5 g/dL (ref 31.5–35.7)
MCV: 94 fL (ref 79–97)
MONOS ABS: 0.6 10*3/uL (ref 0.1–0.9)
Monocytes: 6 %
NEUTROS PCT: 71 %
Neutrophils Absolute: 6.6 10*3/uL (ref 1.4–7.0)
PLATELETS: 265 10*3/uL (ref 150–379)
RBC: 3.79 x10E6/uL (ref 3.77–5.28)
RDW: 14 % (ref 12.3–15.4)
WBC: 9.3 10*3/uL (ref 3.4–10.8)

## 2016-04-18 LAB — VITAMIN D 25 HYDROXY (VIT D DEFICIENCY, FRACTURES): VIT D 25 HYDROXY: 23.2 ng/mL — AB (ref 30.0–100.0)

## 2016-04-19 LAB — HM MAMMOGRAPHY

## 2016-04-20 ENCOUNTER — Telehealth: Payer: Self-pay | Admitting: Family Medicine

## 2016-04-20 ENCOUNTER — Ambulatory Visit (INDEPENDENT_AMBULATORY_CARE_PROVIDER_SITE_OTHER): Payer: Medicare Other | Admitting: Family Medicine

## 2016-04-20 ENCOUNTER — Encounter: Payer: Self-pay | Admitting: Family Medicine

## 2016-04-20 VITALS — BP 138/58 | HR 51 | Temp 97.0°F | Ht 63.0 in | Wt 139.0 lb

## 2016-04-20 DIAGNOSIS — I10 Essential (primary) hypertension: Secondary | ICD-10-CM

## 2016-04-20 DIAGNOSIS — N184 Chronic kidney disease, stage 4 (severe): Secondary | ICD-10-CM

## 2016-04-20 DIAGNOSIS — Z13820 Encounter for screening for osteoporosis: Secondary | ICD-10-CM

## 2016-04-20 DIAGNOSIS — E785 Hyperlipidemia, unspecified: Secondary | ICD-10-CM | POA: Diagnosis not present

## 2016-04-20 DIAGNOSIS — Z1211 Encounter for screening for malignant neoplasm of colon: Secondary | ICD-10-CM

## 2016-04-20 DIAGNOSIS — Z86718 Personal history of other venous thrombosis and embolism: Secondary | ICD-10-CM | POA: Diagnosis not present

## 2016-04-20 DIAGNOSIS — Z780 Asymptomatic menopausal state: Secondary | ICD-10-CM

## 2016-04-20 DIAGNOSIS — E559 Vitamin D deficiency, unspecified: Secondary | ICD-10-CM | POA: Diagnosis not present

## 2016-04-20 LAB — COAGUCHEK XS/INR WAIVED
INR: 2.6 — AB (ref 0.9–1.1)
Prothrombin Time: 30.7 s

## 2016-04-20 NOTE — Progress Notes (Signed)
Subjective:    Patient ID: Elizabeth Burch, female    DOB: 01-Jul-1926, 82 y.o.   MRN: [id number]  HPI Pt here for follow up and management of chronic medical problems which includes hypertension. She is taking medications regularly.The patient comes in today for the visit and as usual has no specific complaints. She has a history of chronic kidney disease hyperlipidemia statin intolerant and gout. She is due to get a ProTime today also. Get her DEXA scan today. She has had lab work done and this will be reviewed with her during the visit. Most recent creatinine was 1.79 with a GFR of 25. Electrolytes were good. Her cholesterol numbers remain elevated but she is statin intolerant. The vitamin D is low but we can't treat this because of her renal function. The patient denies any chest pain shortness of breath anymore than usual. She is swallowing her food without problems with no heartburn indigestion nausea vomiting diarrhea or blood in the stool. Her bowel movements are as normal. She is passing her water without problems. She's not had any falls or injuries at home.     Patient Active Problem List   Diagnosis Date Noted  . Statin intolerance 03/19/2015  . Cardiomegaly 03/19/2015  . Osteoporosis 12/24/2013  . Kyphosis 11/03/2013  . Acute venous embolism and thrombosis of deep vessels of distal lower extremity ([place]) 08/26/2013  . 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 Fibrocystic disease of right breast 05/22/2013  . DVT of leg (deep venous thrombosis) ([place]) 02/16/2013  . Leg edema, left 09/16/2012  . Gout   . CKD (chronic kidney disease) 01/08/2012  . Multinodular thyroid 10/25/2011  . Fibrocystic breast disease, left.   . Open-angle glaucoma   . Hyperlipidemia 01/06/2009  . Essential hypertension 01/06/2009   Outpatient Encounter Prescriptions as of 04/20/2016  Medication Sig  . amLODipine (NORVASC) 10 MG tablet Take 1 tablet (10 mg total) by mouth daily.  .  calcitRIOL (ROCALTROL) 0.25 MCG capsule Take 0.25 mcg by mouth daily. Three times weekly  . colchicine (COLCRYS) 0.6 MG tablet Take 1 tablet (0.6 mg total) by mouth 2 (two) times daily as needed.  . dorzolamide-timolol (COSOPT) 22.3-6.8 MG/ML ophthalmic solution Place 1 drop into both eyes 2 (two) times daily.   . febuxostat (ULORIC) 40 MG tablet Take 1 tablet (40 mg total) by mouth daily.  . furosemide (LASIX) 40 MG tablet Take 40 mg by mouth daily.   Marland Kitchen losartan (COZAAR) 100 MG tablet TAKE ONE TABLET BY MOUTH ONCE DAILY  . metoprolol succinate (TOPROL-XL) 25 MG 24 hr tablet TAKE ONE TABLET BY MOUTH ONCE DAILY  . Omega-3 Fatty Acids (FISH OIL) 1000 MG CAPS Take 1,000 mg by mouth daily.  . pantoprazole (PROTONIX) 40 MG tablet TAKE ONE TABLET BY MOUTH ONCE DAILY  . TRAVATAN Z 0.004 % SOLN ophthalmic solution Place 1 drop into both eyes daily.  Marland Kitchen warfarin (COUMADIN) 2 MG tablet TAKE ONE & ONE-HALF TABLETS BY MOUTH ONCE DAILY OR  AS  DIRECTED  BY  ANTICOAGULATION  CLINIC   No facility-administered encounter medications on file as of 04/20/2016.         Review of Systems  Constitutional: Negative.   HENT: Negative.   Eyes: Negative.   Respiratory: Negative.   Cardiovascular: Negative.   Gastrointestinal: Negative.   Endocrine: Negative.   Genitourinary: Negative.   Musculoskeletal: Negative.   Skin: Negative.   Allergic/Immunologic: Negative.   Neurological: Negative.   Hematological: Negative.  Psychiatric/Behavioral: Negative.        Objective:   Physical Exam  Constitutional: She is oriented to person, place, and time. She appears well-developed and well-nourished. No distress.  The patient is alert and doing well for her age. She has thoracic kyphosis.  HENT:  Head: Normocephalic and atraumatic.  Right Ear: External ear normal.  Left Ear: External ear normal.  Nose: Nose normal.  Mouth/Throat: Oropharynx is clear and moist.  Eyes: Conjunctivae and EOM are normal. Pupils are  equal, round, and reactive to light. Right eye exhibits no discharge. Left eye exhibits no discharge. No scleral icterus.  Neck: Normal range of motion. Neck supple. No thyromegaly present.  No bruits thyromegaly or anterior cervical adenopathy  Cardiovascular: Normal rate, regular rhythm, normal heart sounds and intact distal pulses.   No murmur heard. The heart is regular at 60/m  Pulmonary/Chest: Effort normal and breath sounds normal. No respiratory distress. She has no wheezes. She has no rales. She exhibits no tenderness.  Clear anteriorly and posteriorly  Abdominal: Soft. Bowel sounds are normal. She exhibits no mass. There is no tenderness. There is no rebound and no guarding.  No abdominal tenderness masses or organ enlargement or bruits  Musculoskeletal: Normal range of motion. She exhibits no edema.  Lymphadenopathy:    She has no cervical adenopathy.  Neurological: She is alert and oriented to person, place, and time. She has normal reflexes. No cranial nerve deficit.  Skin: Skin is warm and dry. No rash noted.  Psychiatric: She has a normal mood and affect. Her behavior is normal. Judgment and thought content normal.  Nursing note and vitals reviewed.  BP 138/58 mmHg  Pulse 51  Temp(Src) 97 F (36.1 C) (Oral)  Ht 5\' 3"  (1.6 m)  Wt 139 lb (63.05 kg)  BMI 24.63 kg/m2  [facility] reading (PRIMARY) by  Dr. Clerance Lav scan results pending                                        Assessment & Plan:  1. Hyperlipidemia -The patient's cholesterol remains elevated but she is statin intolerant. She will continue with as aggressive therapeutic lifestyle changes as possible.  2. Vitamin D deficiency -The vitamin D level is low and the nephrologist is currently treating her 3 times weekly with vitamin D and she should continue to follow his recommendations because of her renal insufficiency - DG [facility] DEXA; Future  3. Chronic kidney disease (CKD), stage IV (severe) (HCC) -Continue to  follow-up with nephrology  4. Essential hypertension -Blood pressure is good today and she will continue with current treatment  5. History of DVT of lower extremity -Continue with Coumadin as directed by clinical pharmacy - CoaguChek XS/INR Waived  6. Screening for osteoporosis -The patient is postmenopausal and has thoracic kyphosis. - DG [facility] DEXA; Future  7. Postmenopausal - DG [facility] DEXA; Future  8. Special screening for malignant neoplasms, colon - Fecal occult blood, imunochemical; Future  9. CKD (chronic kidney disease), stage 4 (severe) (HCC) -Continue to follow-up with nephrology  10. Gout -Continue to follow-up with rheumatology  Patient Instructions                       Medicare Annual Wellness Visit  [facility] and the medical providers at [facility] strive to bring you the best medical care.  In doing so we  not only want to address your current medical conditions and concerns but also to detect new conditions early and prevent illness, disease and health-related problems.    Medicare offers a yearly Wellness Visit which allows our clinical staff to assess your need for preventative services including immunizations, lifestyle education, counseling to decrease risk of preventable diseases and screening for fall risk and other medical concerns.    This visit is provided free of charge (no copay) for all Medicare recipients. The clinical pharmacists at [facility] have begun to conduct these Wellness Visits which will also include a thorough review of all your medications.    As you primary medical provider recommend that you make an appointment for your Annual Wellness Visit if you have not done so already this year.  You may set up this appointment before you leave today or you may call back [phone] and schedule an appointment.  Please make sure when you call that you mention that you are scheduling your Annual Wellness  Visit with the clinical pharmacist so that the appointment may be made for the proper length of time.  ,   Continue current medications. Continue good therapeutic lifestyle changes which include good diet and exercise. Fall precautions discussed with patient. If an FOBT was given today- please return it to our front desk. If you are over 28 years old - you may need Prevnar 12 or the adult Pneumonia vaccine.  **Flu shots are available--- please call and schedule a FLU-CLINIC appointment**  After your visit with [place] today you will receive a survey in the mail or online from [employer] regarding your care with [place]. Please take a moment to fill this out. Your feedback is very important to [place] as you can help [place] better understand your patient needs as well as improve your experience and satisfaction. WE CARE ABOUT YOU!!!   The patient should continue to follow-up with the cardiologist on a yearly basis She should follow-up with her nephrologist, Dr. Mercy Amulya Quintin every 6 months She should follow-up with her rheumatologist, Dr. Amil Amen every 6 months She will get a ProTime today. We will call with the results of the DEXA once it becomes available.   Arrie Senate MD

## 2016-04-20 NOTE — Patient Instructions (Addendum)
Medicare Annual Wellness Visit  [facility] and the medical providers at [facility] strive to bring you the best medical care.  In doing so we not only want to address your current medical conditions and concerns but also to detect new conditions early and prevent illness, disease and health-related problems.    Medicare offers a yearly Wellness Visit which allows our clinical staff to assess your need for preventative services including immunizations, lifestyle education, counseling to decrease risk of preventable diseases and screening for fall risk and other medical concerns.    This visit is provided free of charge (no copay) for all Medicare recipients. The clinical pharmacists at [facility] have begun to conduct these Wellness Visits which will also include a thorough review of all your medications.    As you primary medical provider recommend that you make an appointment for your Annual Wellness Visit if you have not done so already this year.  You may set up this appointment before you leave today or you may call back [phone] and schedule an appointment.  Please make sure when you call that you mention that you are scheduling your Annual Wellness Visit with the clinical pharmacist so that the appointment may be made for the proper length of time.  ,   Continue current medications. Continue good therapeutic lifestyle changes which include good diet and exercise. Fall precautions discussed with patient. If an FOBT was given today- please return it to our front desk. If you are over 40 years old - you may need Prevnar 25 or the adult Pneumonia vaccine.  **Flu shots are available--- please call and schedule a FLU-CLINIC appointment**  After your visit with [place] today you will receive a survey in the mail or online from [employer] regarding your care with [place]. Please take a moment to fill this out. Your feedback is very  important to [place] as you can help [place] better understand your patient needs as well as improve your experience and satisfaction. WE CARE ABOUT YOU!!!   The patient should continue to follow-up with the cardiologist on a yearly basis She should follow-up with her nephrologist, Dr. Mercy Moore every 6 months She should follow-up with her rheumatologist, Dr. Amil Amen every 6 months She will get a ProTime today. We will call with the results of the DEXA once it becomes available.

## 2016-04-21 ENCOUNTER — Other Ambulatory Visit: Payer: Medicare Other

## 2016-04-21 DIAGNOSIS — Z1211 Encounter for screening for malignant neoplasm of colon: Secondary | ICD-10-CM

## 2016-04-25 LAB — FECAL OCCULT BLOOD, IMMUNOCHEMICAL: Fecal Occult Bld: NEGATIVE

## 2016-05-22 ENCOUNTER — Ambulatory Visit (INDEPENDENT_AMBULATORY_CARE_PROVIDER_SITE_OTHER): Payer: Medicare Other | Admitting: Pharmacist

## 2016-05-22 ENCOUNTER — Encounter: Payer: Self-pay | Admitting: Pharmacist

## 2016-05-22 VITALS — BP 154/62 | HR 64 | Ht 63.0 in | Wt 143.0 lb

## 2016-05-22 DIAGNOSIS — I824Y3 Acute embolism and thrombosis of unspecified deep veins of proximal lower extremity, bilateral: Secondary | ICD-10-CM

## 2016-05-22 DIAGNOSIS — Z0000 Encounter for general adult medical examination without abnormal findings: Secondary | ICD-10-CM

## 2016-05-22 DIAGNOSIS — Z7901 Long term (current) use of anticoagulants: Secondary | ICD-10-CM

## 2016-05-22 LAB — COAGUCHEK XS/INR WAIVED
INR: 2.2 — AB (ref 0.9–1.1)
Prothrombin Time: 26.9 s

## 2016-05-22 MED ORDER — WARFARIN SODIUM 2 MG PO TABS: ORAL_TABLET | ORAL | Status: DC

## 2016-05-22 NOTE — Progress Notes (Signed)
Patient ID: [name], female   DOB: May 24, 1926, 82 y.o.   MRN: [id number]    Subjective:   Elizabeth Burch is a 82 y.o. female who presents for a subsequent Medicare Annual Wellness Visit and to have protime rechecked.  Elizabeth Burch lives with her husband in [place], [place].   Review of Systems  Constitutional: Negative.   HENT: Negative.   Eyes: Negative.   Respiratory: Negative.   Cardiovascular: Positive for leg swelling (taking furosemide).  Gastrointestinal: Negative.   Genitourinary: Negative.   Musculoskeletal: Positive for back pain (related to arthritis) and joint pain.  Skin: Negative.   Neurological: Negative.   Endo/Heme/Allergies: Bruises/bleeds easily (taking warfarin ).  Psychiatric/Behavioral: Negative.      Current Medications (verified) Outpatient Encounter Prescriptions as of 05/22/2016  Medication Sig  . amLODipine (NORVASC) 10 MG tablet Take 1 tablet (10 mg total) by mouth daily.  . calcitRIOL (ROCALTROL) 0.25 MCG capsule Take 0.25 mcg by mouth daily. Three times weekly  . colchicine (COLCRYS) 0.6 MG tablet Take 1 tablet (0.6 mg total) by mouth 2 (two) times daily as needed.  . dorzolamide-timolol (COSOPT) 22.3-6.8 MG/ML ophthalmic solution Place 1 drop into both eyes 2 (two) times daily.   . febuxostat (ULORIC) 40 MG tablet Take 1 tablet (40 mg total) by mouth daily.  . furosemide (LASIX) 40 MG tablet Take 40 mg by mouth daily.   Marland Kitchen losartan (COZAAR) 100 MG tablet TAKE ONE TABLET BY MOUTH ONCE DAILY  . metoprolol succinate (TOPROL-XL) 25 MG 24 hr tablet TAKE ONE TABLET BY MOUTH ONCE DAILY  . Omega-3 Fatty Acids (FISH OIL) 1000 MG CAPS Take 1,000 mg by mouth daily.  . pantoprazole (PROTONIX) 40 MG tablet TAKE ONE TABLET BY MOUTH ONCE DAILY (Patient taking differently: takes 1 tablet about 2 times per week)  . TRAVATAN Z 0.004 % SOLN ophthalmic solution Place 1 drop into both eyes daily.  Marland Kitchen warfarin (COUMADIN) 2 MG tablet TAKE ONE & ONE-HALF TABLETS BY  MOUTH ONCE DAILY OR  AS  DIRECTED  BY  ANTICOAGULATION  CLINIC  . [DISCONTINUED] warfarin (COUMADIN) 2 MG tablet TAKE ONE & ONE-HALF TABLETS BY MOUTH ONCE DAILY OR  AS  DIRECTED  BY  ANTICOAGULATION  CLINIC   No facility-administered encounter medications on file as of 05/22/2016.    Allergies (verified) Prednisone; Ace inhibitors; Allopurinol; Alendronate sodium; Ciprofloxacin; Clarithromycin; Clindamycin/lincomycin; Nitrofurantoin; Ofloxacin; Penicillins; Sulfamethoxazole; and Sulfonamide derivatives   History: Past Medical History  Diagnosis Date  . HTN (hypertension)   . Fibrocystic breast disease   . Palpitations   . Glaucoma   . Multiple thyroid nodules   . Stasis dermatitis   . Gout   . DVT (deep venous thrombosis) ([place]) 1950  . Arthritis     Gout  . Peripheral vascular disease ([place])   . Chronic kidney disease, stage IV (severe) ([place])   . Diverticulosis of colon (without mention of hemorrhage) 2008    Dr. Patterson(Colonoscopy)  . Hiatal hernia 2004    Dr. Arther Dames)  . GERD (gastroesophageal reflux disease) 2004    Dr. Arther Dames)  . External hemorrhoids without mention of complication [id number]    Dr. Patterson(Colonoscopy)  . Cataract    Past Surgical History  Procedure Laterality Date  . Tonsillectomy    . Cesarean section    . Cystectomy      on colon  . Appendectomy     Family History  Problem Relation Age of Onset  . Cancer Mother  pt unware of the origin  . Breast cancer Sister   . Heart disease Brother   . Heart attack Brother   . Breast cancer Sister   . Breast cancer Sister   . Cirrhosis Brother   . Diabetes Brother    Social History   Occupational History  . Retired     Social History Main Topics  . Smoking status: Never Smoker   . Smokeless tobacco: Never Used  . Alcohol Use: No  . Drug Use: No  . Sexual Activity: Yes    Do you feel safe at home?  Yes  Dietary issues and exercise activities: Current Exercise Habits: Home  exercise routine, Type of exercise: walking, Time (Minutes): 25, Frequency (Times/Week): 3, Weekly Exercise (Minutes/Week): 75, Intensity: Moderate  Current Dietary habits:  Tries to limit green leafy vegetables to 2 per week.   Limits salt intake and does not add salt to foods.  Avoids canned vegetables when possible and rinses them or uses low sodium vegetables.   Objective:    Today's Vitals   05/22/16 0925  BP: 154/62  Pulse: 64  Height: 5\' 3"  (1.6 m)  Weight: 143 lb (64.864 kg)  PainSc: 0-No pain   Body mass index is 25.34 kg/(m^2).   INR was 2.2 today  Activities of Daily Living In your present state of health, do you have any difficulty performing the following activities: 05/22/2016  Hearing? N  Vision? N  Difficulty concentrating or making decisions? N  Walking or climbing stairs? N  Dressing or bathing? N  Doing errands, shopping? N  Preparing Food and eating ? N  Using the Toilet? N  In the past six months, have you accidently leaked urine? N  Do you have problems with loss of bowel control? N  Managing your Medications? N  Managing your Finances? N  Housekeeping or managing your Housekeeping? N    Are there smokers in your home (other than you)? No   Cardiac Risk Factors include: advanced age (>31men, >59 women);dyslipidemia;hypertension  Depression Screen PHQ 2/9 Scores 05/22/2016 04/20/2016 12/08/2015 07/23/2015  PHQ - 2 Score 0 0 0 0    Fall Risk Fall Risk  05/22/2016 04/20/2016 12/08/2015 07/23/2015 04/19/2015  Falls in the past year? No No No No No    Cognitive Function: MMSE - Mini Mental State Exam 05/22/2016 05/22/2016 04/19/2015 04/19/2015  Orientation to time 5 5 5 5   Orientation to Place 5 5 5 5   Registration 3 3 3 3   Attention/ Calculation 5 5 5 5   Recall 3 3 3 3   Language- name 2 objects 2 - 2 2  Language- repeat 1 - 1 1  Language- follow 3 step command 3 - 3 3  Language- read & follow direction 1 - 1 1  Write a sentence 1 - 1 -  Copy design 1 - 1  -  Total score 30 - 30 -    Immunizations and Health Maintenance Immunization History  Administered Date(s) Administered  . Influenza,inj,Quad PF,36+ Mos 10/23/2013   There are no preventive care reminders to display for this patient.  Patient Care Team: Chipper Herb, MD as PCP - General (Family Medicine) Larey Dresser, MD as Consulting Physician (Cardiology) Alphonsa Overall, MD (General Surgery) Fleet Contras, MD (Nephrology) Sable Feil, MD (Gastroenterology) Hennie Duos, MD (Rheumatology)   Sees Dr Binnie Rail ophthalmologist in [place] for [place]. Last visit was 04/2016  Indicate any recent Medical Services you may have received from other than [facility]  providers in the past year (date may be approximate).    Assessment:    Annual Wellness Visit  Therapeutic anticoagulation   Screening Tests Health Maintenance  Topic Date Due  . INFLUENZA VACCINE  12/07/2017 (Originally 07/04/2016)  . PNA vac Low Risk Adult (1 of 2 - PCV13) 12/07/2020 (Originally 10/20/1993)  . PAP SMEAR  09/22/2016  . DEXA SCAN  10/04/2016  . MAMMOGRAM  04/13/2017  . TETANUS/TDAP  07/04/2021        Plan:   During the course of the visit Rhian was educated and counseled about the following appropriate screening and preventive services:   Vaccines to include Pneumoccal, Influenza, Td, Zostavax - patient refuses Prevnar 13 and Zostavax   Colorectal cancer screening - UTD on FOBT;  Patient no longer required to get colonoscnopy  Cardiovascular disease screening - BP was elevated in office today but she checks at home and BP readings are usually 130's/ 50's.  HR was in 60's in office.  Patient will continue to check BP at home and report to office if she gets readings over 140/90 or HR less than 50.  Diabetes screening - UTD  Bone Denisty / Osteoporosis Screening - UTD next due 10/2016 - order already plaes  Mammogram - UTD  Glaucoma screening / Eye Exam - UTD  Advanced  Directives - UTD  Anticoagulation Dose Instructions as of 05/22/2016      [name] Tue Wed Thu Fri Sat   New Dose 3 mg 4 mg 3 mg 3 mg 3 mg 3 mg 3 mg    Description        Continue current warfarin 2mg  dose - take 2 tablets on Mondays and 1 and 1/2 tablets all other days.       Patient Instructions (the written plan) were given to the patient.   Cherre Robins, [place], CPP, CDE   05/22/2016

## 2016-05-22 NOTE — Patient Instructions (Addendum)
Anticoagulation Dose Instructions as of 05/22/2016      [name] Tue Wed Thu Fri Sat   New Dose 3 mg 4 mg 3 mg 3 mg 3 mg 3 mg 3 mg    Description        Continue current warfarin 2mg  dose - take 2 tablets on Mondays and 1 and 1/2 tablets all other days.     INR was 2.2 today   Elizabeth Burch , Thank you for taking time to come for your Medicare Wellness Visit. I appreciate your ongoing commitment to your health goals. Please review the following plan we discussed and let me know if I can assist you in the future.   These are the goals we discussed: Continue to walk - goal is to exercise 150 minutes per week.   Increase non-starchy vegetables - carrots, green bean, squash, zucchini, tomatoes, onions, peppers, spinach and other green leafy vegetables, cabbage, lettuce, cucumbers, asparagus, okra (not fried), eggplant Limit sugar and processed foods (cakes, cookies, ice cream, crackers and chips) Increase fresh fruit but limit serving sizes 1/2 cup or about the size of tennis or baseball Limit red meat to no more than 1-2 times per week (serving size about the size of your palm) Choose whole grains / lean proteins - whole wheat bread, quinoa, whole grain rice (1/2 cup), fish, chicken, [place] Avoid sugar and calorie containing beverages - soda, sweet tea and juice.  Choose water or unsweetened tea instead.   This is a list of the screening recommended for you and due dates:  Health Maintenance  Topic Date Due  . Flu Shot  12/07/2017*  . Pneumonia vaccines (1 of 2 - PCV13) 12/07/2020*  . Pap Smear  09/22/2016  . Mammogram  04/13/2017  . Tetanus Vaccine  07/04/2021  . DEXA scan (bone density measurement)  Completed  *Topic was postponed. The date shown is not the original due date.   DASH Eating Plan DASH stands for "Dietary Approaches to Stop Hypertension." The DASH eating plan is a healthy eating plan that has been shown to reduce high blood pressure (hypertension). Additional health  benefits may include reducing the risk of type 2 diabetes mellitus, heart disease, and stroke. The DASH eating plan may also help with weight loss. WHAT DO I NEED TO KNOW ABOUT THE DASH EATING PLAN? For the DASH eating plan, you will follow these general guidelines:  Choose foods with a percent daily value for sodium of less than 5% (as listed on the food label).  Use salt-free seasonings or herbs instead of table salt or sea salt.  Check with your health care provider or pharmacist before using salt substitutes.  Eat lower-sodium products, often labeled as "lower sodium" or "no salt added."  Eat fresh foods.  Eat more vegetables, fruits, and low-fat dairy products.  Choose whole grains. Look for the word "whole" as the first word in the ingredient list.  Choose fish and skinless chicken or [place] more often than red meat. Limit fish, poultry, and meat to 6 oz (170 g) each day.  Limit sweets, desserts, sugars, and sugary drinks.  Choose heart-healthy fats.  Limit cheese to 1 oz (28 g) per day.  Eat more home-cooked food and less restaurant, buffet, and fast food.  Limit fried foods.  Cook foods using methods other than frying.  Limit canned vegetables. If you do use them, rinse them well to decrease the sodium.  When eating at a restaurant, ask that your food be prepared  with less salt, or no salt if possible. WHAT FOODS CAN I EAT? Seek help from a dietitian for individual calorie needs. Grains Whole grain or whole wheat bread. Brown rice. Whole grain or whole wheat pasta. Quinoa, bulgur, and whole grain cereals. Low-sodium cereals. Corn or whole wheat flour tortillas. Whole grain cornbread. Whole grain crackers. Low-sodium crackers. Vegetables Fresh or frozen vegetables (raw, steamed, roasted, or grilled). Low-sodium or reduced-sodium tomato and vegetable juices. Low-sodium or reduced-sodium tomato sauce and paste. Low-sodium or reduced-sodium canned vegetables.  Fruits All  fresh, canned (in natural juice), or frozen fruits. Meat and Other Protein Products Ground beef (85% or leaner), grass-fed beef, or beef trimmed of fat. Skinless chicken or [place]. Ground chicken or [place]. Pork trimmed of fat. All fish and seafood. Eggs. Dried beans, peas, or lentils. Unsalted nuts and seeds. Unsalted canned beans. Dairy Low-fat dairy products, such as skim or 1% milk, 2% or reduced-fat cheeses, low-fat ricotta or cottage cheese, or plain low-fat yogurt. Low-sodium or reduced-sodium cheeses. Fats and Oils Tub margarines without trans fats. Light or reduced-fat mayonnaise and salad dressings (reduced sodium). Avocado. Safflower, olive, or canola oils. Natural peanut or almond butter. Other Unsalted popcorn and pretzels. The items listed above may not be a complete list of recommended foods or beverages. Contact your dietitian for more options. WHAT FOODS ARE NOT RECOMMENDED? Grains White bread. White pasta. White rice. Refined cornbread. Bagels and croissants. Crackers that contain trans fat. Vegetables Creamed or fried vegetables. Vegetables in a cheese sauce. Regular canned vegetables. Regular canned tomato sauce and paste. Regular tomato and vegetable juices. Fruits Dried fruits. Canned fruit in light or heavy syrup. Fruit juice. Meat and Other Protein Products Fatty cuts of meat. Ribs, chicken wings, bacon, sausage, bologna, salami, chitterlings, fatback, hot dogs, bratwurst, and packaged luncheon meats. Salted nuts and seeds. Canned beans with salt. Dairy Whole or 2% milk, cream, half-and-half, and cream cheese. Whole-fat or sweetened yogurt. Full-fat cheeses or blue cheese. Nondairy creamers and whipped toppings. Processed cheese, cheese spreads, or cheese curds. Condiments Onion and garlic salt, seasoned salt, table salt, and sea salt. Canned and packaged gravies. Worcestershire sauce. Tartar sauce. Barbecue sauce. Teriyaki sauce. Soy sauce, including reduced sodium.  Steak sauce. Fish sauce. Oyster sauce. Cocktail sauce. Horseradish. Ketchup and mustard. Meat flavorings and tenderizers. Bouillon cubes. Hot sauce. Tabasco sauce. Marinades. Taco seasonings. Relishes. Fats and Oils Butter, stick margarine, lard, shortening, ghee, and bacon fat. Coconut, palm kernel, or palm oils. Regular salad dressings. Other Pickles and olives. Salted popcorn and pretzels. The items listed above may not be a complete list of foods and beverages to avoid. Contact your dietitian for more information. WHERE CAN I FIND MORE INFORMATION? National Heart, Lung, and Blood Institute: travelstabloid.com   This information is not intended to replace advice given to you by your health care provider. Make sure you discuss any questions you have with your health care provider.   Document Released: 11/09/2011 Document Revised: 12/11/2014 Document Reviewed: 09/24/2013 Elsevier Interactive Patient Education [employer].

## 2016-06-14 ENCOUNTER — Other Ambulatory Visit: Payer: Self-pay | Admitting: Cardiology

## 2016-06-26 ENCOUNTER — Ambulatory Visit (INDEPENDENT_AMBULATORY_CARE_PROVIDER_SITE_OTHER): Payer: Medicare Other | Admitting: Pharmacist

## 2016-06-26 DIAGNOSIS — Z7901 Long term (current) use of anticoagulants: Secondary | ICD-10-CM

## 2016-06-26 DIAGNOSIS — I824Y3 Acute embolism and thrombosis of unspecified deep veins of proximal lower extremity, bilateral: Secondary | ICD-10-CM | POA: Diagnosis not present

## 2016-06-26 LAB — COAGUCHEK XS/INR WAIVED
INR: 1.9 — ABNORMAL HIGH (ref 0.9–1.1)
Prothrombin Time: 22.4 s

## 2016-06-29 ENCOUNTER — Other Ambulatory Visit: Payer: Self-pay | Admitting: Cardiology

## 2016-06-29 DIAGNOSIS — R011 Cardiac murmur, unspecified: Secondary | ICD-10-CM

## 2016-07-05 ENCOUNTER — Other Ambulatory Visit: Payer: Self-pay | Admitting: Family Medicine

## 2016-07-27 ENCOUNTER — Ambulatory Visit (INDEPENDENT_AMBULATORY_CARE_PROVIDER_SITE_OTHER): Payer: Medicare Other | Admitting: Pharmacist

## 2016-07-27 DIAGNOSIS — Z86718 Personal history of other venous thrombosis and embolism: Secondary | ICD-10-CM | POA: Diagnosis not present

## 2016-07-27 DIAGNOSIS — Z7901 Long term (current) use of anticoagulants: Secondary | ICD-10-CM | POA: Diagnosis not present

## 2016-07-27 DIAGNOSIS — I824Y3 Acute embolism and thrombosis of unspecified deep veins of proximal lower extremity, bilateral: Secondary | ICD-10-CM

## 2016-07-27 LAB — COAGUCHEK XS/INR WAIVED
INR: 1.9 — AB (ref 0.9–1.1)
PROTHROMBIN TIME: 22.8 s

## 2016-08-02 ENCOUNTER — Other Ambulatory Visit: Payer: Self-pay | Admitting: Family Medicine

## 2016-08-09 ENCOUNTER — Other Ambulatory Visit: Payer: Self-pay | Admitting: Cardiology

## 2016-08-21 ENCOUNTER — Other Ambulatory Visit: Payer: Medicare Other

## 2016-08-21 DIAGNOSIS — E559 Vitamin D deficiency, unspecified: Secondary | ICD-10-CM

## 2016-08-21 DIAGNOSIS — E785 Hyperlipidemia, unspecified: Secondary | ICD-10-CM

## 2016-08-21 DIAGNOSIS — I10 Essential (primary) hypertension: Secondary | ICD-10-CM

## 2016-08-22 LAB — HEPATIC FUNCTION PANEL
ALBUMIN: 4.1 g/dL (ref 3.5–4.7)
ALT: 15 IU/L (ref 0–32)
AST: 19 IU/L (ref 0–40)
Alkaline Phosphatase: 75 IU/L (ref 39–117)
BILIRUBIN TOTAL: 0.4 mg/dL (ref 0.0–1.2)
Bilirubin, Direct: 0.1 mg/dL (ref 0.00–0.40)
TOTAL PROTEIN: 6.5 g/dL (ref 6.0–8.5)

## 2016-08-22 LAB — NMR, LIPOPROFILE
Cholesterol: 230 mg/dL — ABNORMAL HIGH (ref 100–199)
HDL Cholesterol by NMR: 93 mg/dL (ref >39)
HDL Particle Number: 44.4 umol/L (ref >=30.5)
LDL PARTICLE NUMBER: 1518 nmol/L — AB (ref <1000)
LDL SIZE: 20.8 nm (ref >20.5)
LDL-C: 113 mg/dL — ABNORMAL HIGH (ref 0–99)
Small LDL Particle Number: 542 nmol/L — ABNORMAL HIGH (ref <=527)
Triglycerides by NMR: 121 mg/dL (ref 0–149)

## 2016-08-22 LAB — BMP8+EGFR
BUN / CREAT RATIO: 20 (ref 12–28)
BUN: 37 mg/dL — ABNORMAL HIGH (ref 8–27)
CO2: 22 mmol/L (ref 18–29)
Calcium: 10.4 mg/dL — ABNORMAL HIGH (ref 8.7–10.3)
Chloride: 104 mmol/L (ref 96–106)
Creatinine, Ser: 1.81 mg/dL — ABNORMAL HIGH (ref 0.57–1.00)
GFR calc Af Amer: 29 mL/min/{1.73_m2} — ABNORMAL LOW (ref >59)
GFR calc non Af Amer: 25 mL/min/{1.73_m2} — ABNORMAL LOW (ref >59)
GLUCOSE: 88 mg/dL (ref 65–99)
Potassium: 4.3 mmol/L (ref 3.5–5.2)
SODIUM: 143 mmol/L (ref 134–144)

## 2016-08-22 LAB — VITAMIN D 25 HYDROXY (VIT D DEFICIENCY, FRACTURES): Vit D, 25-Hydroxy: 26.5 ng/mL — ABNORMAL LOW (ref 30.0–100.0)

## 2016-08-22 LAB — CBC WITH DIFFERENTIAL/PLATELET
BASOS ABS: 0 10*3/uL (ref 0.0–0.2)
Basos: 0 %
EOS (ABSOLUTE): 0.2 10*3/uL (ref 0.0–0.4)
Eos: 3 %
Hematocrit: 36.7 % (ref 34.0–46.6)
Hemoglobin: 11.8 g/dL (ref 11.1–15.9)
IMMATURE GRANS (ABS): 0 10*3/uL (ref 0.0–0.1)
Immature Granulocytes: 0 %
LYMPHS ABS: 1.8 10*3/uL (ref 0.7–3.1)
LYMPHS: 26 %
MCH: 30.5 pg (ref 26.6–33.0)
MCHC: 32.2 g/dL (ref 31.5–35.7)
MCV: 95 fL (ref 79–97)
Monocytes Absolute: 0.5 10*3/uL (ref 0.1–0.9)
Monocytes: 8 %
NEUTROS ABS: 4.3 10*3/uL (ref 1.4–7.0)
NEUTROS PCT: 63 %
PLATELETS: 254 10*3/uL (ref 150–379)
RBC: 3.87 x10E6/uL (ref 3.77–5.28)
RDW: 14.4 % (ref 12.3–15.4)
WBC: 6.8 10*3/uL (ref 3.4–10.8)

## 2016-08-24 ENCOUNTER — Ambulatory Visit (INDEPENDENT_AMBULATORY_CARE_PROVIDER_SITE_OTHER): Payer: Medicare Other

## 2016-08-24 ENCOUNTER — Encounter: Payer: Self-pay | Admitting: Family Medicine

## 2016-08-24 ENCOUNTER — Ambulatory Visit (INDEPENDENT_AMBULATORY_CARE_PROVIDER_SITE_OTHER): Payer: Medicare Other | Admitting: Family Medicine

## 2016-08-24 VITALS — BP 145/49 | HR 54 | Temp 97.0°F | Ht 63.0 in | Wt 136.0 lb

## 2016-08-24 DIAGNOSIS — E785 Hyperlipidemia, unspecified: Secondary | ICD-10-CM

## 2016-08-24 DIAGNOSIS — Z13820 Encounter for screening for osteoporosis: Secondary | ICD-10-CM

## 2016-08-24 DIAGNOSIS — Z86718 Personal history of other venous thrombosis and embolism: Secondary | ICD-10-CM

## 2016-08-24 DIAGNOSIS — N184 Chronic kidney disease, stage 4 (severe): Secondary | ICD-10-CM

## 2016-08-24 DIAGNOSIS — Z780 Asymptomatic menopausal state: Secondary | ICD-10-CM

## 2016-08-24 DIAGNOSIS — E559 Vitamin D deficiency, unspecified: Secondary | ICD-10-CM | POA: Diagnosis not present

## 2016-08-24 DIAGNOSIS — M4004 Postural kyphosis, thoracic region: Secondary | ICD-10-CM

## 2016-08-24 DIAGNOSIS — I10 Essential (primary) hypertension: Secondary | ICD-10-CM | POA: Diagnosis not present

## 2016-08-24 LAB — COAGUCHEK XS/INR WAIVED
INR: 1.7 — AB (ref 0.9–1.1)
PROTHROMBIN TIME: 19.9 s

## 2016-08-24 MED ORDER — WARFARIN SODIUM 2 MG PO TABS: ORAL_TABLET | ORAL | 3 refills | Status: DC

## 2016-08-24 NOTE — Patient Instructions (Addendum)
Medicare Annual Wellness Visit  [facility] and the medical providers at [facility] strive to bring you the best medical care.  In doing so we not only want to address your current medical conditions and concerns but also to detect new conditions early and prevent illness, disease and health-related problems.    Medicare offers a yearly Wellness Visit which allows our clinical staff to assess your need for preventative services including immunizations, lifestyle education, counseling to decrease risk of preventable diseases and screening for fall risk and other medical concerns.    This visit is provided free of charge (no copay) for all Medicare recipients. The clinical pharmacists at [facility] have begun to conduct these Wellness Visits which will also include a thorough review of all your medications.    As you primary medical provider recommend that you make an appointment for your Annual Wellness Visit if you have not done so already this year.  You may set up this appointment before you leave today or you may call back ([phone]) and schedule an appointment.  Please make sure when you call that you mention that you are scheduling your Annual Wellness Visit with the clinical pharmacist so that the appointment may be made for the proper length of time.     Continue current medications. Continue good therapeutic lifestyle changes which include good diet and exercise. Fall precautions discussed with patient. If an FOBT was given today- please return it to our front desk. If you are over 83 years old - you may need Prevnar 69 or the adult Pneumonia vaccine.  **Flu shots are available--- please call and schedule a FLU-CLINIC appointment**  After your visit with [place] today you will receive a survey in the mail or online from [employer] regarding your care with [place]. Please take a moment to fill this out. Your feedback is very  important to [place] as you can help [place] better understand your patient needs as well as improve your experience and satisfaction. WE CARE ABOUT YOU!!!   Continue to follow-up with cardiology, rheumatology, and nephrology Always be careful and do not put yourself at risk for falling and avoid climbing Continue to drink plenty of fluids and stay well hydrated

## 2016-08-24 NOTE — Progress Notes (Signed)
Subjective:    Patient ID: Elizabeth Burch, female    DOB: 1926/11/11, 82 y.o.   MRN: [id number]  HPI  Pt here for follow up and management of chronic medical problems which includes hyperlipidemia and hypertension. She is taking medications regularly.This patient is followed regularly by cardiology rheumatology and nephrology. She has chronic venous thrombosis of the lower extremity. She has chronic kidney disease and gout. She also has cardiomegaly. She has had lab work done and this will be reviewed with her during the visit today. She is statin intolerant and can only control her cholesterol through aggressive therapeutic lifestyle changes. The most recent lab work had a total LDL particle number that was 1518. The LDL C was 113. Triglycerides are good at 121. The blood sugar was good at 88. The creatinine remained elevated at 1.81 and this was consistent with past readings. Liver function tests were normal and the vitamin D level was low. Will be reviewed with this and will be given a copy to take to her other doctors. A she denies any chest pain or shortness of breath. She coughs occasionally but no more than usual. She has no trouble with her intestinal tract including swallowing heartburn indigestion nausea vomiting diarrhea or blood in the stool. Her bowels move regularly and is been no change in her bowel habits. She is passing her water without problems. She stays active and is always busy and has no particular complaints. She is somewhat kyphotic in appearance.  Patient Active Problem List   Diagnosis Date Noted  . Statin intolerance 03/19/2015  . Cardiomegaly 03/19/2015  . Osteoporosis 12/24/2013  . Kyphosis 11/03/2013  . Acute venous embolism and thrombosis of deep vessels of distal lower extremity ([place]) 08/26/2013  . 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 Fibrocystic disease of right breast 05/22/2013  . DVT of leg (deep venous thrombosis) ([place]) 02/16/2013  . Leg  edema, left 09/16/2012  . Gout   . CKD (chronic kidney disease) 01/08/2012  . Multinodular thyroid 10/25/2011  . Fibrocystic breast disease, left.   . Open-angle glaucoma   . Hyperlipidemia 01/06/2009  . Essential hypertension 01/06/2009   Outpatient Encounter Prescriptions as of 08/24/2016  Medication Sig  . amLODipine (NORVASC) 10 MG tablet TAKE ONE TABLET BY MOUTH ONCE DAILY  . calcitRIOL (ROCALTROL) 0.25 MCG capsule Take 0.25 mcg by mouth daily. Three times weekly  . colchicine (COLCRYS) 0.6 MG tablet Take 1 tablet (0.6 mg total) by mouth 2 (two) times daily as needed.  . dorzolamide-timolol (COSOPT) 22.3-6.8 MG/ML ophthalmic solution Place 1 drop into both eyes 2 (two) times daily.   . furosemide (LASIX) 40 MG tablet Take 40 mg by mouth daily.   Marland Kitchen losartan (COZAAR) 100 MG tablet TAKE ONE TABLET BY MOUTH ONCE DAILY  . metoprolol succinate (TOPROL-XL) 25 MG 24 hr tablet Take 1 tablet (25 mg total) by mouth daily. Please call and schedule a one year follow up appointment for further refills  . Omega-3 Fatty Acids (FISH OIL) 1000 MG CAPS Take 1,000 mg by mouth daily.  . pantoprazole (PROTONIX) 40 MG tablet TAKE ONE TABLET BY MOUTH ONCE DAILY (Patient taking differently: takes 1 tablet about 2 times per week)  . TRAVATAN Z 0.004 % SOLN ophthalmic solution Place 1 drop into both eyes daily.  Marland Kitchen ULORIC 40 MG tablet TAKE ONE TABLET BY MOUTH ONCE DAILY  . warfarin (COUMADIN) 2 MG tablet TAKE ONE & ONE-HALF TABLETS BY MOUTH ONCE DAILY OR  AS  DIRECTED  BY  ANTICOAGULATION  CLINIC   No facility-administered encounter medications on file as of 08/24/2016.       Review of Systems  Constitutional: Negative.   HENT: Negative.   Eyes: Negative.   Respiratory: Negative.   Cardiovascular: Negative.   Gastrointestinal: Negative.   Endocrine: Negative.   Genitourinary: Negative.   Musculoskeletal: Negative.   Skin: Negative.   Allergic/Immunologic: Negative.   Neurological: Negative.     Hematological: Negative.   Psychiatric/Behavioral: Negative.        Objective:   Physical Exam  Constitutional: She is oriented to person, place, and time. She appears well-developed and well-nourished. No distress.  The patient is pleasant and alert  HENT:  Head: Normocephalic and atraumatic.  Right Ear: External ear normal.  Left Ear: External ear normal.  Nose: Nose normal.  Mouth/Throat: Oropharynx is clear and moist.  Eyes: Conjunctivae and EOM are normal. Pupils are equal, round, and reactive to light. Right eye exhibits no discharge. Left eye exhibits no discharge. No scleral icterus.  Neck: Normal range of motion. Neck supple. No thyromegaly present.  Cardiovascular: Normal rate, regular rhythm, normal heart sounds and intact distal pulses.   No murmur heard. The heart has a regular rate and rhythm at 60/m  Pulmonary/Chest: Effort normal and breath sounds normal. No respiratory distress. She has no wheezes. She has no rales.  Lungs are clear anteriorly and posteriorly  Abdominal: Soft. Bowel sounds are normal. She exhibits no mass. There is no tenderness. There is no rebound and no guarding.  No abdominal tenderness or organ enlargement or masses and no bruits  Musculoskeletal: Normal range of motion. She exhibits no edema.  The patient has a kyphotic posture prominently in the thoracic region  Lymphadenopathy:    She has no cervical adenopathy.  Neurological: She is alert and oriented to person, place, and time. She has normal reflexes. No cranial nerve deficit.  Skin: Skin is warm and dry. No rash noted.  Psychiatric: She has a normal mood and affect. Her behavior is normal. Judgment and thought content normal.  Nursing note and vitals reviewed.   BP (!) 145/49 (BP Location: Left Arm)   Pulse (!) 54   Temp 97 F (36.1 C) (Oral)   Ht 5\' 3"  (1.6 m)   Wt 136 lb (61.7 kg)   BMI 24.09 kg/m   A DEXA scans will be performed today.     Assessment & Plan:  1.  Hyperlipidemia -The cholesterol numbers are slightly decreased from the previous check. The patient is statin intolerant and intolerant to many other drugs in addition to this. She will have to continue with as aggressive therapeutic lifestyle changes as possible.  2. Vitamin D deficiency -Vitamin D replacement per direction of nephrology - DG [facility] DEXA; Future  3. Chronic kidney disease (CKD), stage IV (severe) (HCC) -Continue to follow-up with nephrologist  4. Essential hypertension -Continue current treatment and sodium restriction  5. History of DVT of lower extremity -Continue Coumadin and regular monitoring by clinical pharmacy - CoaguChek XS/INR Waived  6. Postmenopausal - DG [facility] DEXA; Future  7. Screening for osteoporosis - DG [facility] DEXA; Future  8. Postural kyphosis of thoracic region -The patient is aware of her kyphosis and has to be even more careful with not falling.  Meds ordered this encounter  Medications  . warfarin (COUMADIN) 2 MG tablet    Sig: TAKE ONE & ONE-HALF TABLETS BY MOUTH ONCE DAILY OR  AS  DIRECTED  BY  ANTICOAGULATION  CLINIC    Dispense:  135 tablet    Refill:  3   Patient Instructions                       Medicare Annual Wellness Visit  [facility] and the medical providers at [facility] strive to bring you the best medical care.  In doing so we not only want to address your current medical conditions and concerns but also to detect new conditions early and prevent illness, disease and health-related problems.    Medicare offers a yearly Wellness Visit which allows our clinical staff to assess your need for preventative services including immunizations, lifestyle education, counseling to decrease risk of preventable diseases and screening for fall risk and other medical concerns.    This visit is provided free of charge (no copay) for all Medicare recipients. The clinical pharmacists at [facility]  have begun to conduct these Wellness Visits which will also include a thorough review of all your medications.    As you primary medical provider recommend that you make an appointment for your Annual Wellness Visit if you have not done so already this year.  You may set up this appointment before you leave today or you may call back ([phone]) and schedule an appointment.  Please make sure when you call that you mention that you are scheduling your Annual Wellness Visit with the clinical pharmacist so that the appointment may be made for the proper length of time.     Continue current medications. Continue good therapeutic lifestyle changes which include good diet and exercise. Fall precautions discussed with patient. If an FOBT was given today- please return it to our front desk. If you are over 39 years old - you may need Prevnar 55 or the adult Pneumonia vaccine.  **Flu shots are available--- please call and schedule a FLU-CLINIC appointment**  After your visit with [place] today you will receive a survey in the mail or online from [employer] regarding your care with [place]. Please take a moment to fill this out. Your feedback is very important to [place] as you can help [place] better understand your patient needs as well as improve your experience and satisfaction. WE CARE ABOUT YOU!!!   Continue to follow-up with cardiology, rheumatology, and nephrology Always be careful and do not put yourself at risk for falling and avoid climbing Continue to drink plenty of fluids and stay well hydrated     Arrie Senate MD

## 2016-09-06 ENCOUNTER — Other Ambulatory Visit: Payer: Self-pay | Admitting: Cardiology

## 2016-09-07 ENCOUNTER — Ambulatory Visit (INDEPENDENT_AMBULATORY_CARE_PROVIDER_SITE_OTHER): Payer: Medicare Other | Admitting: Pharmacist

## 2016-09-07 DIAGNOSIS — Z7901 Long term (current) use of anticoagulants: Secondary | ICD-10-CM | POA: Diagnosis not present

## 2016-09-07 DIAGNOSIS — I824Y3 Acute embolism and thrombosis of unspecified deep veins of proximal lower extremity, bilateral: Secondary | ICD-10-CM

## 2016-09-07 LAB — COAGUCHEK XS/INR WAIVED
INR: 2.6 — AB (ref 0.9–1.1)
Prothrombin Time: 31.5 s

## 2016-09-20 ENCOUNTER — Encounter: Payer: Self-pay | Admitting: Physician Assistant

## 2016-09-27 ENCOUNTER — Encounter: Payer: Self-pay | Admitting: Physician Assistant

## 2016-09-27 ENCOUNTER — Ambulatory Visit (INDEPENDENT_AMBULATORY_CARE_PROVIDER_SITE_OTHER): Payer: Medicare Other | Admitting: Physician Assistant

## 2016-09-27 VITALS — BP 190/58 | HR 53 | Ht 63.0 in | Wt 136.8 lb

## 2016-09-27 DIAGNOSIS — E7800 Pure hypercholesterolemia, unspecified: Secondary | ICD-10-CM | POA: Diagnosis not present

## 2016-09-27 DIAGNOSIS — Z86718 Personal history of other venous thrombosis and embolism: Secondary | ICD-10-CM

## 2016-09-27 DIAGNOSIS — I10 Essential (primary) hypertension: Secondary | ICD-10-CM | POA: Diagnosis not present

## 2016-09-27 DIAGNOSIS — N189 Chronic kidney disease, unspecified: Secondary | ICD-10-CM | POA: Diagnosis not present

## 2016-09-27 MED ORDER — AMLODIPINE BESYLATE 10 MG PO TABS: 10.0000 mg | ORAL_TABLET | Freq: Every day | ORAL | 3 refills | Status: DC

## 2016-09-27 MED ORDER — METOPROLOL SUCCINATE ER 25 MG PO TB24: 25.0000 mg | ORAL_TABLET | Freq: Every day | ORAL | 11 refills | Status: DC

## 2016-09-27 NOTE — Progress Notes (Signed)
Cardiology Office Note:    Date:  09/27/2016   ID:  Elizabeth Burch, DOB 09-15-1926, MRN [id number]  PCP:  Redge Gainer, MD  Cardiologist:  Dr. [name]   Electrophysiologist:  n/a  Referring MD: Chipper Herb, MD   Chief Complaint  Patient presents with  . Follow-up    HTN, hx of DVT    History of Present Illness:    Elizabeth Burch is a 82 y.o. female with a hx of HTN, CKD, palpitations. She has a history of spontaneous DVT in the left leg in 10/13 with negative hypercoagulable workup. She has been treated with Coumadin. Last seen by Dr. Aundra Dubin 6/16.  She returns for follow-up. Since last seen, she has been doing well.The patient denies any chest pain, significant dyspnea, syncope, orthopnea, PND, edema.  Prior CV studies that were reviewed today include:    Echo 7/16 EF 60-65, normal Malatesta motion, grade 1 diastolic dysfunction, MAC, PASP 34   Past Medical History:  Diagnosis Date  . Arthritis    Gout  . Cataract   . Chronic kidney disease, stage IV (severe) ([place])   . Diverticulosis of colon (without mention of hemorrhage) 2008   Dr. Patterson(Colonoscopy)  . DVT (deep venous thrombosis) ([place]) 1950  . External hemorrhoids without mention of complication 0160   Dr. Patterson(Colonoscopy)  . Fibrocystic breast disease   . GERD (gastroesophageal reflux disease) 2004   Dr. Arther Dames)  . Glaucoma   . Gout   . Hiatal hernia 2004   Dr. Arther Dames)  . HTN (hypertension)   . Multiple thyroid nodules   . Palpitations   . Peripheral vascular disease ([place])   . Stasis dermatitis   1. Hypertension.  2. Hyperlipidemia: Myalgias with multiple statins.  3. Gastroesophageal reflux disease.  4. Fibrocystic breast disease,.  5. History of colon polyps.  6. Palpitations. The patient did have a Holter monitor done in January 2010, showing quite frequent PACs, but no SVT or no other serious arrhythmia. 3 week event monitor in 9/11 showed mild sinus bradycardia to  the 50s at times, otherwise unremarkable.  7. Echocardiogram done in January 2010 showed an EF of 70%, normal LV size, no regional Schou motion abnormalities. The valves look normal. There is mild pulmonary hypertension with pulmonary artery systolic pressure of 40 mmHg. Echo (10/11): Mild focal basal septal hypertrophy, EF 10-93%, grade I diastolic dysfunction, PA systolic pressure 31 mmHg, borderline atrial septal aneurysm.  8. CKD with mild hyperkalemia  9. Carotid dopplers (10/11): normal.  10. Multinodular goiter 11. Gout 12. DVT: 1st episode after childbirth, then another she thinks early 2000s, third episode 10/13.    Past Surgical History:  Procedure Laterality Date  . APPENDECTOMY    . CESAREAN SECTION    . CYSTECTOMY     on colon  . TONSILLECTOMY      Current Medications: Current Meds  Medication Sig  . amLODipine (NORVASC) 10 MG tablet Take 1 tablet (10 mg total) by mouth daily.  . calcitRIOL (ROCALTROL) 0.25 MCG capsule Take 0.25 mcg by mouth daily. Three times weekly  . colchicine (COLCRYS) 0.6 MG tablet Take 1 tablet (0.6 mg total) by mouth 2 (two) times daily as needed.  . dorzolamide-timolol (COSOPT) 22.3-6.8 MG/ML ophthalmic solution Place 1 drop into both eyes 2 (two) times daily.   . furosemide (LASIX) 40 MG tablet Take 40 mg by mouth daily.   Marland Kitchen losartan (COZAAR) 100 MG tablet TAKE ONE TABLET BY MOUTH ONCE DAILY  .  metoprolol succinate (TOPROL-XL) 25 MG 24 hr tablet Take 1 tablet (25 mg total) by mouth daily.  . Omega-3 Fatty Acids (FISH OIL) 1000 MG CAPS Take 1,000 mg by mouth daily.  . pantoprazole (PROTONIX) 40 MG tablet Take 40 mg by mouth 2 (two) times a week.  . TRAVATAN Z 0.004 % SOLN ophthalmic solution Place 1 drop into both eyes daily.  Marland Kitchen ULORIC 40 MG tablet TAKE ONE TABLET BY MOUTH ONCE DAILY  . warfarin (COUMADIN) 2 MG tablet TAKE ONE & ONE-HALF TABLETS BY MOUTH ONCE DAILY OR  AS  DIRECTED  BY  ANTICOAGULATION  CLINIC     Allergies:   Prednisone; Ace  inhibitors; Allopurinol; Alendronate sodium; Ciprofloxacin; Clarithromycin; Clindamycin/lincomycin; Nitrofurantoin; Ofloxacin; Penicillins; Sulfamethoxazole; and Sulfonamide derivatives   Social History   Social History  . Marital status: Married    Spouse name: N/A  . Number of children: 1  . Years of education: N/A   Occupational History  . Retired  Retired   Social History Main Topics  . Smoking status: Never Smoker  . Smokeless tobacco: Never Used  . Alcohol use No  . Drug use: No  . Sexual activity: Yes   Other Topics Concern  . None   Social History Narrative  . None     Family History:  The patient's family history includes Breast cancer in her sister, sister, and sister; Cancer in her mother; Cirrhosis in her brother; Diabetes in her brother; Heart attack in her brother; Heart disease in her brother.   ROS:   Please see the history of present illness.    ROS All other systems reviewed and are negative.   EKGs/Labs/Other Test Reviewed:    EKG:  EKG is  ordered today.  The ekg ordered today demonstrates sinus bradycardia, HR 53, Normal axis, NSSTTW changes, QTc 405 ms  Recent Labs: 08/21/2016: ALT 15; BUN 37; Creatinine, Ser 1.81; Platelets 254; Potassium 4.3; Sodium 143   Recent Lipid Panel    Component Value Date/Time   CHOL 230 (H) 08/21/2016 0800   CHOL 223 (H) 02/04/2014 0807   TRIG 121 08/21/2016 0800   TRIG 82 02/04/2014 0807   HDL 93 08/21/2016 0800   HDL 84 02/04/2014 0807   LDLCALC 121 04/17/2016   LDLCALC 121 (H) 06/23/2014 0806   LDLCALC 123 (H) 02/04/2014 0807     Physical Exam:    VS:  BP (!) 190/58   Pulse (!) 53   Ht 5\' 3"  (1.6 m)   Wt 136 lb 12.8 oz (62.1 kg)   BMI 24.23 kg/m     Wt Readings from Last 3 Encounters:  09/27/16 136 lb 12.8 oz (62.1 kg)  08/24/16 136 lb (61.7 kg)  05/22/16 143 lb (64.9 kg)     Physical Exam  Constitutional: She is oriented to person, place, and time. She appears well-developed and  well-nourished. No distress.  HENT:  Head: Normocephalic and atraumatic.  Eyes: No scleral icterus.  Neck: No JVD present. Carotid bruit is not present.  Cardiovascular: Normal rate, regular rhythm and normal heart sounds.   No murmur heard. Pulmonary/Chest: Effort normal. She has no wheezes. She has no rales.  Abdominal: Soft. There is no tenderness.  Musculoskeletal: She exhibits edema.  Trace bilat edema - compression stockings in place  Neurological: She is alert and oriented to person, place, and time.  Skin: Skin is warm and dry.  Psychiatric: She has a normal mood and affect.    ASSESSMENT:    1.  Essential hypertension   2. History of DVT (deep vein thrombosis)   3. Chronic kidney disease, unspecified CKD stage   4. Pure hypercholesterolemia    PLAN:    In order of problems listed above:  1. HTN:  BP elevated.  She notes that her BP is always high in the clinic.  She has a bp cuff at home.  I have asked her to check her blood pressure for 2 weeks and send me those readings.  With JNC 8, she should be at least < 150/90.  Prefer < 140/90 given CKD.  I will likely change Toprol to Coreg 3.125 if BP above target at home.  2. DVT: Unprovoked Left DVT found in 10/13.  She is on coumadin.  Hypercoagulable workup was unremarkable in the past.  Eliquis 2.5 bid would be an option for her (age 41, Cr 1.8). But she has tolerated coumadin for a long time and is comfortable with this.  Her weight is close to 60 kg.  I suppose Coumadin is the most appropriate choice for now.  Hgb was stable in 9/17.   3. CKD: She follows with Dr. Mercy Moore.  She remains on ARB.   4. Hyperlipidemia: She has been unable to tolerate any statin due to myalgias.   Dispo - She lives near [place] and her husband sees Dr. Minus Breeding there.  With Dr. Larey Dresser move to [facility], I will have her see Dr. Minus Breeding in [place] Medication Adjustments/Labs and Tests Ordered: Current medicines are reviewed  at length with the patient today.  Concerns regarding medicines are outlined above.  Medication changes, Labs and Tests ordered today are outlined in the Patient Instructions noted below. Patient Instructions  Medication Instructions:  No changes. I only sent in Toprol for 30 days at a time in case we need to change your blood pressure medication.  Labwork: None   Testing/Procedures: None   Follow-Up: Dr. Minus Breeding in [place] in 1 year.  Any Other Special Instructions Will Be Listed Below (If Applicable). Please check your blood pressure once a day for the next 1-2 weeks and call me with those readings. We want your blood pressure to at least be less than 150/90.  It would be ideal if we can get it less than 140/90. We may need to change your medications based upon the readings you collect over the next 2 weeks.  If you need a refill on your cardiac medications before your next appointment, please call your pharmacy.   Signed, Richardson Dopp, PA-C  09/27/2016 11:43 AM    [facility] [address], [place], [place]  [address] Phone: [phone]; Fax: [phone]

## 2016-09-27 NOTE — Patient Instructions (Addendum)
Medication Instructions:  No changes. I only sent in Toprol for 30 days at a time in case we need to change your blood pressure medication.  Labwork: None   Testing/Procedures: None   Follow-Up: Dr. Minus Breeding in [place] in 1 year.  Any Other Special Instructions Will Be Listed Below (If Applicable). Please check your blood pressure once a day for the next 1-2 weeks and call me with those readings. We want your blood pressure to at least be less than 150/90.  It would be ideal if we can get it less than 140/90. We may need to change your medications based upon the readings you collect over the next 2 weeks.  If you need a refill on your cardiac medications before your next appointment, please call your pharmacy.

## 2016-09-28 ENCOUNTER — Encounter: Payer: Self-pay | Admitting: Pharmacist

## 2016-10-02 ENCOUNTER — Ambulatory Visit: Payer: Medicare Other | Admitting: Pharmacist

## 2016-10-04 ENCOUNTER — Other Ambulatory Visit: Payer: Self-pay | Admitting: Family Medicine

## 2016-10-11 ENCOUNTER — Ambulatory Visit (INDEPENDENT_AMBULATORY_CARE_PROVIDER_SITE_OTHER): Payer: Medicare Other | Admitting: Pharmacist

## 2016-10-11 ENCOUNTER — Telehealth: Payer: Self-pay | Admitting: Physician Assistant

## 2016-10-11 DIAGNOSIS — I824Y3 Acute embolism and thrombosis of unspecified deep veins of proximal lower extremity, bilateral: Secondary | ICD-10-CM

## 2016-10-11 DIAGNOSIS — Z7901 Long term (current) use of anticoagulants: Secondary | ICD-10-CM | POA: Diagnosis not present

## 2016-10-11 LAB — COAGUCHEK XS/INR WAIVED
INR: 3.1 — ABNORMAL HIGH (ref 0.9–1.1)
PROTHROMBIN TIME: 36.7 s

## 2016-10-11 NOTE — Telephone Encounter (Signed)
Her blood pressure looks good. Continue with current treatment plan. Richardson Dopp, PA-C   10/11/2016 5:07 PM

## 2016-10-11 NOTE — Telephone Encounter (Signed)
Follow Up:    Pt wants you to call so she can give you her blood pressure readings.

## 2016-10-11 NOTE — Telephone Encounter (Signed)
I returned pt's call from earlier who wanted to give some BP readings.   11/3-11/8  145/72 146/61 133/56 135/55 141/57 139/59 136/55 131/58 126/49 133/59 139/62 137/58  Pt aware I will d/w readings with Brynda Rim. PA for further input and will cb with any recommendations if any. Pt said thank you.

## 2016-10-11 NOTE — Telephone Encounter (Signed)
Pt aware per Nicki Reaper W. PA BP looks good. Continue on current Tx plan. Pt agreeable to plan of care.

## 2016-10-23 ENCOUNTER — Telehealth: Payer: Self-pay | Admitting: Family Medicine

## 2016-10-23 NOTE — Telephone Encounter (Signed)
Patient saw opthomologist Friday, November 17th.  She states that she was nauseated after visit and for 2 days after.  She reports feeling well today.  She is not sure if the nausea was related to the eye drops or injection they gave her in the opthomologist office.  She also is suppose to use Visudyne / verteporfin eye drops prior to per appt this week and wanted to make sure she is not allergic to them.  I reviewed allergy list and did not see anything on her list that was related to similar to Visudyne.  I advised Elizabeth Burch to call opthomologist to discuss reaction she had over the weekend.  I am inclined to think she might have just has a stomach virus but they need to be aware of situation.

## 2016-10-25 ENCOUNTER — Encounter: Payer: Self-pay | Admitting: Physician Assistant

## 2016-10-25 ENCOUNTER — Ambulatory Visit (INDEPENDENT_AMBULATORY_CARE_PROVIDER_SITE_OTHER): Payer: Medicare Other | Admitting: Physician Assistant

## 2016-10-25 VITALS — BP 182/61 | HR 59 | Temp 97.2°F | Ht 63.0 in | Wt 136.0 lb

## 2016-10-25 DIAGNOSIS — R110 Nausea: Secondary | ICD-10-CM

## 2016-10-25 DIAGNOSIS — T495X5A Adverse effect of ophthalmological drugs and preparations, initial encounter: Secondary | ICD-10-CM

## 2016-10-25 MED ORDER — ONDANSETRON 4 MG PO TBDP: 4.0000 mg | ORAL_TABLET | Freq: Three times a day (TID) | ORAL | 0 refills | Status: DC | PRN

## 2016-10-25 NOTE — Patient Instructions (Signed)
Nausea, Adult  Nausea is the feeling of an upset stomach or having to vomit. Nausea on its own is not usually a serious concern, but it may be an early sign of a more serious medical problem. As nausea gets worse, it can lead to vomiting. If vomiting develops, or if you are not able to drink enough fluids, you are at risk of becoming dehydrated. Dehydration can make you tired and thirsty, cause you to have a dry mouth, and decrease how often you urinate. Older adults and people with other diseases or a weak immune system are at higher risk for dehydration. The main goals of treating your nausea are:  · To limit repeated nausea episodes.  · To prevent vomiting and dehydration.    Follow these instructions at home:  Follow instructions from your health care provider about how to care for yourself at home.  Eating and drinking   Follow these recommendations as told by your health care provider:  · Take an oral rehydration solution (ORS). This is a drink that is sold at pharmacies and retail stores.  · Drink clear fluids in small amounts as you are able. Clear fluids include water, ice chips, diluted fruit juice, and low-calorie sports drinks.  · Eat bland, easy-to-digest foods in small amounts as you are able. These foods include bananas, applesauce, rice, lean meats, toast, and crackers.  · Avoid drinking fluids that contain a lot of sugar or caffeine, such as energy drinks, sports drinks, and soda.  · Avoid alcohol.  · Avoid spicy or fatty foods.    General instructions   · Drink enough fluid to keep your urine clear or pale yellow.  · Wash your hands often. If soap and water are not available, use hand sanitizer.  · Make sure that all people in your household wash their hands well and often.  · Rest at home while you recover.  · Take over-the-counter and prescription medicines only as told by your health care provider.  · Breathe slowly and deeply when you feel nauseous.  · Watch your condition for any  changes.  · Keep all follow-up visits as told by your health care provider. This is important.  Contact a health care provider if:  · You have a headache.  · You have new symptoms.  · Your nausea gets worse.  · You have a fever.  · You feel light-headed or dizzy.  · You vomit.  · You cannot keep fluids down.  Get help right away if:  · You have pain in your chest, neck, arm, or jaw.  · You feel extremely weak or you faint.  · You have vomit that is bright red or looks like coffee grounds.  · You have bloody or black stools or stools that look like tar.  · You have a severe headache, a stiff neck, or both.  · You have severe pain, cramping, or bloating in your abdomen.  · You have a rash.  · You have difficulty breathing or are breathing very quickly.  · Your heart is beating very quickly.  · Your skin feels cold and clammy.  · You feel confused.  · You have pain when you urinate.  · You have signs of dehydration, such as:  ? Dark urine, very little, or no urine.  ? Cracked lips.  ? Dry mouth.  ? Sunken eyes.  ? Sleepiness.  ? Weakness.  These symptoms may represent a serious problem that is an emergency. Do   not wait to see if the symptoms will go away. Get medical help right away. Call your local emergency services (911 in the [place] Do not drive yourself to the hospital.  This information is not intended to replace advice given to you by your health care provider. Make sure you discuss any questions you have with your health care provider.  Document Released: 12/28/2004 Document Revised: 04/24/2016 Document Reviewed: 07/27/2015  Elsevier Interactive Patient Education © 2017 [employer]vier Inc.

## 2016-10-25 NOTE — Progress Notes (Signed)
BP (!) 182/61   Pulse (!) 59   Temp 97.2 F (36.2 C) (Oral)   Ht 5\' 3"  (1.6 m)   Wt 136 lb (61.7 kg)   BMI 24.09 kg/m    Subjective:    Patient ID: Elizabeth Burch, female    DOB: 11-08-1926, 82 y.o.   MRN: [id number]  HPI: Elizabeth Burch is a 82 y.o. female presenting on 10/25/2016 for Nausea (since Eye injection on friday) She was given a injection of Vigamox by the ophthalmologist last week. She states that she has had nausea ever since then. She has had a minimal amount of pain around the eye of the first date resolve quickly. She has not had any vomiting, diarrhea, fever. She does have an allergy to ciprofloxacin and ofloxacin.  Relevant past medical, surgical, family and social history reviewed and updated as indicated. Allergies and medications reviewed and updated.  Past Medical History:  Diagnosis Date  . Arthritis    Gout  . Cataract   . Chronic kidney disease, stage IV (severe) ([place])   . Diverticulosis of colon (without mention of hemorrhage) 2008   Dr. Patterson(Colonoscopy)  . DVT (deep venous thrombosis) ([place]) 1950  . External hemorrhoids without mention of complication 1017   Dr. Patterson(Colonoscopy)  . Fibrocystic breast disease   . GERD (gastroesophageal reflux disease) 2004   Dr. Arther Dames)  . Glaucoma   . Gout   . Hiatal hernia 2004   Dr. Arther Dames)  . HTN (hypertension)   . Multiple thyroid nodules   . Palpitations   . Peripheral vascular disease ([place])   . Stasis dermatitis     Past Surgical History:  Procedure Laterality Date  . APPENDECTOMY    . CESAREAN SECTION    . CYSTECTOMY     on colon  . TONSILLECTOMY      Review of Systems  Constitutional: Positive for appetite change. Negative for activity change, fatigue and fever.  HENT: Negative.   Eyes: Negative.  Negative for photophobia, pain, redness and visual disturbance.  Respiratory: Negative.  Negative for cough.   Cardiovascular: Negative.  Negative for chest pain.    Gastrointestinal: Positive for nausea. Negative for abdominal distention, abdominal pain, constipation, diarrhea, rectal pain and vomiting.  Endocrine: Negative.   Genitourinary: Negative.  Negative for dysuria.  Musculoskeletal: Negative.   Skin: Negative.   Allergic/Immunologic: Negative.   Neurological: Negative.   Hematological: Negative.   Psychiatric/Behavioral: Negative.       Medication List       Accurate as of 10/25/16 10:22 PM. Always use your most recent med list.          amLODipine 10 MG tablet Commonly known as:  NORVASC Take 1 tablet (10 mg total) by mouth daily.   calcitRIOL 0.25 MCG capsule Commonly known as:  ROCALTROL Take 0.25 mcg by mouth daily. Three times weekly   colchicine 0.6 MG tablet Commonly known as:  COLCRYS Take 1 tablet (0.6 mg total) by mouth 2 (two) times daily as needed.   dorzolamide-timolol 22.3-6.8 MG/ML ophthalmic solution Commonly known as:  COSOPT Place 1 drop into both eyes 2 (two) times daily.   Fish Oil 1000 MG Caps Take 1,000 mg by mouth daily.   furosemide 40 MG tablet Commonly known as:  LASIX Take 40 mg by mouth daily.   losartan 100 MG tablet Commonly known as:  COZAAR TAKE ONE TABLET BY MOUTH ONCE DAILY   metoprolol succinate 25 MG 24 hr tablet Commonly known as:  TOPROL-XL Take 1 tablet (25 mg total) by mouth daily.   ondansetron 4 MG disintegrating tablet Commonly known as:  ZOFRAN-ODT Take 1 tablet (4 mg total) by mouth every 8 (eight) hours as needed for nausea or vomiting.   pantoprazole 40 MG tablet Commonly known as:  PROTONIX Take 40 mg by mouth 2 (two) times a week.   TRAVATAN Z 0.004 % Soln ophthalmic solution Generic drug:  Travoprost (BAK Free) Place 1 drop into both eyes daily.   ULORIC 40 MG tablet Generic drug:  febuxostat TAKE ONE TABLET BY MOUTH ONCE DAILY   warfarin 2 MG tablet Commonly known as:  COUMADIN TAKE ONE & ONE-HALF TABLETS BY MOUTH ONCE DAILY OR  AS  DIRECTED  BY   ANTICOAGULATION  CLINIC          Objective:    BP (!) 182/61   Pulse (!) 59   Temp 97.2 F (36.2 C) (Oral)   Ht 5\' 3"  (1.6 m)   Wt 136 lb (61.7 kg)   BMI 24.09 kg/m   Allergies  Allergen Reactions  . Prednisone Hypertension  . Ace Inhibitors Other (See Comments)    unknown  . Allopurinol   . Alendronate Sodium Rash  . Ciprofloxacin Rash  . Clarithromycin Rash  . Clindamycin/Lincomycin Rash  . Nitrofurantoin Rash  . Ofloxacin Rash  . Penicillins Rash  . Sulfamethoxazole Rash  . Sulfonamide Derivatives Rash    Physical Exam  Constitutional: She is oriented to person, place, and time. She appears well-developed and well-nourished.  HENT:  Head: Normocephalic and atraumatic.  Eyes: Conjunctivae and EOM are normal. Pupils are equal, round, and reactive to light.  Neck: Normal range of motion. Neck supple.  Cardiovascular: Normal rate, regular rhythm, normal heart sounds and intact distal pulses.   Pulmonary/Chest: Effort normal and breath sounds normal.  Abdominal: Soft. Bowel sounds are normal.  Neurological: She is alert and oriented to person, place, and time. She has normal reflexes.  Skin: Skin is warm and dry. No rash noted.  Psychiatric: She has a normal mood and affect. Her behavior is normal. Judgment and thought content normal.    Results for orders placed or performed in visit on 10/11/16  CoaguChek XS/INR Waived  Result Value Ref Range   INR 3.1 (H) 0.9 - 1.1   Prothrombin Time 36.7 sec      Assessment & Plan:   1. Side effect of ophthalmological drug Follow up with ophthalmologist concerning any future procedures or medications. A list of her medications is giving so they will know what her allergies are. Zofran 4 mg 1 tablet up to 3 times a day as needed for nausea has been sent to the pharmacy.  Continue all other maintenance medications as listed above.  Follow up plan: Return if symptoms worsen or fail to improve.  Educational handout given  for nausea  Terald Sleeper PA-C [place] [address]  [place], [place] [address] [phone]   10/25/2016, 10:22 PM

## 2016-10-27 ENCOUNTER — Encounter (HOSPITAL_COMMUNITY): Payer: Self-pay

## 2016-10-27 ENCOUNTER — Emergency Department (HOSPITAL_COMMUNITY)
Admission: EM | Admit: 2016-10-27 | Discharge: 2016-10-27 | Disposition: A | Discharge status: Home / Self Care | Payer: Medicare Other | Attending: Emergency Medicine | Admitting: Emergency Medicine

## 2016-10-27 DIAGNOSIS — I129 Hypertensive chronic kidney disease with stage 1 through stage 4 chronic kidney disease, or unspecified chronic kidney disease: Secondary | ICD-10-CM | POA: Diagnosis not present

## 2016-10-27 DIAGNOSIS — N184 Chronic kidney disease, stage 4 (severe): Secondary | ICD-10-CM | POA: Diagnosis not present

## 2016-10-27 DIAGNOSIS — K2900 Acute gastritis without bleeding: Secondary | ICD-10-CM | POA: Diagnosis not present

## 2016-10-27 DIAGNOSIS — R1013 Epigastric pain: Secondary | ICD-10-CM | POA: Diagnosis present

## 2016-10-27 DIAGNOSIS — Z79899 Other long term (current) drug therapy: Secondary | ICD-10-CM | POA: Diagnosis not present

## 2016-10-27 DIAGNOSIS — Z7901 Long term (current) use of anticoagulants: Secondary | ICD-10-CM | POA: Insufficient documentation

## 2016-10-27 LAB — URINALYSIS, ROUTINE W REFLEX MICROSCOPIC
BILIRUBIN URINE: NEGATIVE
GLUCOSE, UA: NEGATIVE mg/dL
KETONES UR: NEGATIVE mg/dL
Leukocytes, UA: NEGATIVE
NITRITE: NEGATIVE
Protein, ur: 100 mg/dL — AB
Specific Gravity, Urine: 1.017 (ref 1.005–1.030)
pH: 6.5 (ref 5.0–8.0)

## 2016-10-27 LAB — COMPREHENSIVE METABOLIC PANEL
ALBUMIN: 3.9 g/dL (ref 3.5–5.0)
ALT: 16 U/L (ref 14–54)
AST: 19 U/L (ref 15–41)
Alkaline Phosphatase: 65 U/L (ref 38–126)
Anion gap: 9 (ref 5–15)
BUN: 27 mg/dL — ABNORMAL HIGH (ref 6–20)
CHLORIDE: 108 mmol/L (ref 101–111)
CO2: 25 mmol/L (ref 22–32)
CREATININE: 1.78 mg/dL — AB (ref 0.44–1.00)
Calcium: 10 mg/dL (ref 8.9–10.3)
GFR calc non Af Amer: 24 mL/min — ABNORMAL LOW (ref >60)
GFR, EST AFRICAN AMERICAN: 28 mL/min — AB (ref >60)
GLUCOSE: 115 mg/dL — AB (ref 65–99)
Potassium: 3.7 mmol/L (ref 3.5–5.1)
SODIUM: 142 mmol/L (ref 135–145)
Total Bilirubin: 0.3 mg/dL (ref 0.3–1.2)
Total Protein: 6.6 g/dL (ref 6.5–8.1)

## 2016-10-27 LAB — URINE MICROSCOPIC-ADD ON

## 2016-10-27 LAB — CBC
HCT: 37.5 % (ref 36.0–46.0)
HEMOGLOBIN: 12.6 g/dL (ref 12.0–15.0)
MCH: 31 pg (ref 26.0–34.0)
MCHC: 33.6 g/dL (ref 30.0–36.0)
MCV: 92.4 fL (ref 78.0–100.0)
PLATELETS: 270 10*3/uL (ref 150–400)
RBC: 4.06 MIL/uL (ref 3.87–5.11)
RDW: 14 % (ref 11.5–15.5)
WBC: 7.6 10*3/uL (ref 4.0–10.5)

## 2016-10-27 LAB — LIPASE, BLOOD: LIPASE: 37 U/L (ref 11–51)

## 2016-10-27 LAB — PROTIME-INR
INR: 1.93
Prothrombin Time: 22.3 seconds — ABNORMAL HIGH (ref 11.4–15.2)

## 2016-10-27 MED ORDER — GI COCKTAIL ~~LOC~~
30.0000 mL | Freq: Once | ORAL | Status: AC
  Administered 2016-10-27: 30 mL via ORAL
  Filled 2016-10-27: qty 30

## 2016-10-27 MED ORDER — SUCRALFATE 1 GM/10ML PO SUSP: 1.0000 g | Freq: Three times a day (TID) | ORAL | 0 refills | Status: DC

## 2016-10-27 NOTE — ED Provider Notes (Signed)
[facility] DEPT Provider Note   CSN: [id number] Arrival date & time: 10/27/16  1353     History   Chief Complaint Chief Complaint  Patient presents with  . Nausea  . Abdominal Pain    HPI Elizabeth Burch is a 82 y.o. female.  This is an 82 year old female who presents with 1 week of epigastric pain and nausea.  This occurred after receiving an injection of an antibiotic for an eye infection.  She did see her primary care office who prescribed Zofran.  She states the Zofran does decrease the nausea and make her sleepy.  She states occasionally she will have no discomfort.  It wakes her from sleep occasionally.  Occasionally will radiate to the right upper quadrant.  She's had no episodes of vomiting.  No change in her bowel movement.  She does report a decrease in appetite.  Denies any ill contacts or previous history of epigastric discomfort      Past Medical History:  Diagnosis Date  . Arthritis    Gout  . Cataract   . Chronic kidney disease, stage IV (severe) ([place])   . Diverticulosis of colon (without mention of hemorrhage) 2008   Dr. Patterson(Colonoscopy)  . DVT (deep venous thrombosis) ([place]) 1950  . External hemorrhoids without mention of complication 9528   Dr. Patterson(Colonoscopy)  . Fibrocystic breast disease   . GERD (gastroesophageal reflux disease) 2004   Dr. Arther Dames)  . Glaucoma   . Gout   . Hiatal hernia 2004   Dr. Arther Dames)  . HTN (hypertension)   . Multiple thyroid nodules   . Palpitations   . Peripheral vascular disease ([place])   . Stasis dermatitis     Patient Active Problem List   Diagnosis Date Noted  . Statin intolerance 03/19/2015  . Cardiomegaly 03/19/2015  . Osteoporosis 12/24/2013  . Kyphosis 11/03/2013  . Acute venous embolism and thrombosis of deep vessels of distal lower extremity ([place]) 08/26/2013  . 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 Fibrocystic disease of right breast 05/22/2013  . DVT of leg  (deep venous thrombosis) ([place]) 02/16/2013  . Leg edema, left 09/16/2012  . Gout   . CKD (chronic kidney disease) 01/08/2012  . Multinodular thyroid 10/25/2011  . Fibrocystic breast disease, left.   . Open-angle glaucoma   . Hyperlipidemia 01/06/2009  . Essential hypertension 01/06/2009    Past Surgical History:  Procedure Laterality Date  . APPENDECTOMY    . CESAREAN SECTION    . CYSTECTOMY     on colon  . TONSILLECTOMY      OB History    No data available       Home Medications    Prior to Admission medications   Medication Sig Start Date End Date Taking? Authorizing Provider  amLODipine (NORVASC) 10 MG tablet Take 1 tablet (10 mg total) by mouth daily. 09/27/16   Liliane Shi, PA-C  calcitRIOL (ROCALTROL) 0.25 MCG capsule Take 0.25 mcg by mouth daily. Three times weekly 04/30/13   Historical Provider, MD  colchicine (COLCRYS) 0.6 MG tablet Take 1 tablet (0.6 mg total) by mouth 2 (two) times daily as needed. 02/22/15   Cherre Robins, PharmD  dorzolamide-timolol (COSOPT) 22.3-6.8 MG/ML ophthalmic solution Place 1 drop into both eyes 2 (two) times daily.  10/27/13   Historical Provider, MD  furosemide (LASIX) 40 MG tablet Take 40 mg by mouth daily.  10/05/11   Historical Provider, MD  losartan (COZAAR) 100 MG tablet TAKE ONE TABLET BY MOUTH  ONCE DAILY 07/06/16   Chipper Herb, MD  metoprolol succinate (TOPROL-XL) 25 MG 24 hr tablet Take 1 tablet (25 mg total) by mouth daily. 09/27/16   Liliane Shi, PA-C  Omega-3 Fatty Acids (FISH OIL) 1000 MG CAPS Take 1,000 mg by mouth daily.    Historical Provider, MD  ondansetron (ZOFRAN-ODT) 4 MG disintegrating tablet Take 1 tablet (4 mg total) by mouth every 8 (eight) hours as needed for nausea or vomiting. 10/25/16   Terald Sleeper, PA-C  pantoprazole (PROTONIX) 40 MG tablet Take 40 mg by mouth 2 (two) times a week.    Historical Provider, MD  sucralfate (CARAFATE) 1 GM/10ML suspension Take 10 mLs (1 g total) by mouth 4 (four) times daily  -  with meals and at bedtime. 10/27/16   Junius Creamer, NP  TRAVATAN Z 0.004 % SOLN ophthalmic solution Place 1 drop into both eyes daily. 08/04/15   Historical Provider, MD  ULORIC 40 MG tablet TAKE ONE TABLET BY MOUTH ONCE DAILY 10/04/16   Chipper Herb, MD  warfarin (COUMADIN) 2 MG tablet TAKE ONE & ONE-HALF TABLETS BY MOUTH ONCE DAILY OR  AS  DIRECTED  BY  ANTICOAGULATION  CLINIC 08/24/16   Chipper Herb, MD    Family History Family History  Problem Relation Age of Onset  . Cancer Mother     pt Francene Boyers of the origin  . Breast cancer Sister   . Heart disease Brother   . Heart attack Brother   . Breast cancer Sister   . Breast cancer Sister   . Cirrhosis Brother   . Diabetes Brother     Social History Social History  Substance Use Topics  . Smoking status: Never Smoker  . Smokeless tobacco: Never Used  . Alcohol use No     Allergies   Prednisone; Ace inhibitors; Allopurinol; Alendronate sodium; Ciprofloxacin; Clarithromycin; Clindamycin/lincomycin; Nitrofurantoin; Ofloxacin; Penicillins; Sulfamethoxazole; and Sulfonamide derivatives   Review of Systems Review of Systems  Constitutional: Negative for fever.  Cardiovascular: Negative for chest pain.  Gastrointestinal: Positive for abdominal pain and nausea. Negative for abdominal distention, constipation, diarrhea and vomiting.  Genitourinary: Negative for dysuria.  Neurological: Negative for dizziness and weakness.  All other systems reviewed and are negative.    Physical Exam Updated Vital Signs BP 165/56 (BP Location: Left Arm)   Pulse (!) 57   Temp 98.2 F (36.8 C) (Oral)   Resp 18   Ht 5\' 2"  (1.575 m)   Wt 61.7 kg   SpO2 94%   BMI 24.87 kg/m   Physical Exam  Constitutional: She appears well-developed and well-nourished.  Eyes: Pupils are equal, round, and reactive to light.  Neck: Normal range of motion.  Cardiovascular: Normal rate.   Pulmonary/Chest: Effort normal.  Abdominal: Soft. Bowel sounds are  normal. She exhibits no distension. There is tenderness in the epigastric area.  Musculoskeletal: Normal range of motion.  Neurological: She is alert.  Skin: Skin is warm.  Psychiatric: She has a normal mood and affect.  Vitals reviewed.    ED Treatments / Results  Labs (all labs ordered are listed, but only abnormal results are displayed) Labs Reviewed  COMPREHENSIVE METABOLIC PANEL - Abnormal; Notable for the following:       Result Value   Glucose, Bld 115 (*)    BUN 27 (*)    Creatinine, Ser 1.78 (*)    GFR calc non Af Amer 24 (*)    GFR calc Af Amer 28 (*)  All other components within normal limits  URINALYSIS, ROUTINE W REFLEX MICROSCOPIC (NOT AT [facility]) - Abnormal; Notable for the following:    Color, Urine AMBER (*)    Hgb urine dipstick SMALL (*)    Protein, ur 100 (*)    All other components within normal limits  PROTIME-INR - Abnormal; Notable for the following:    Prothrombin Time 22.3 (*)    All other components within normal limits  URINE MICROSCOPIC-ADD ON - Abnormal; Notable for the following:    Squamous Epithelial / LPF 0-5 (*)    Bacteria, UA RARE (*)    Casts HYALINE CASTS (*)    All other components within normal limits  LIPASE, BLOOD  CBC    EKG  EKG Interpretation  Date/Time:  Friday October 27 2016 21:42:57 EST Ventricular Rate:  62 PR Interval:    QRS Duration: 76 QT Interval:  404 QTC Calculation: 411 R Axis:   60 Text Interpretation:  Sinus rhythm Abnormal R-wave progression, early transition Nonspecific repol abnormality, diffuse leads Confirmed by Hazle Coca [phone]) on 10/27/2016 9:45:22 PM       Radiology No results found.  Procedures Procedures (including critical care time)  Medications Ordered in ED Medications  gi cocktail (Maalox,Lidocaine,Donnatal) (30 mLs Oral Given 10/27/16 2055)     Initial Impression / Assessment and Plan / ED Course  I have reviewed the triage vital signs and the nursing notes.  Pertinent  labs & imaging results that were available during my care of the patient were reviewed by me and considered in my medical decision making (see chart for details).  Clinical Course      Patient was given GI cocktail for symptom relief with success.  She will begin prescription for Carafate to use for the next several days.  INR EKG were checked, all within normal parameters  Final Clinical Impressions(s) / ED Diagnoses   Final diagnoses:  Epigastric pain  Acute gastritis without hemorrhage, unspecified gastritis type    New Prescriptions New Prescriptions   SUCRALFATE (CARAFATE) 1 GM/10ML SUSPENSION    Take 10 mLs (1 g total) by mouth 4 (four) times daily -  with meals and at bedtime.     Junius Creamer, NP 10/27/16 2222    Junius Creamer, NP 10/27/16 2222    Quintella Reichert, MD 10/28/16 1550

## 2016-10-27 NOTE — Discharge Instructions (Signed)
You have been give a prescription for Carafate this will help your symptoms  use as directed for the next several days  Make an appointment with your PCP for follow up evaluation

## 2016-10-27 NOTE — ED Triage Notes (Signed)
Pt reports she had a Vigamox injection last Monday which caused her to be nauseous. She reports she has had intermittent abd pain and nausea. She denies any vomiting.

## 2016-11-01 ENCOUNTER — Ambulatory Visit (INDEPENDENT_AMBULATORY_CARE_PROVIDER_SITE_OTHER): Payer: Medicare Other | Admitting: Family Medicine

## 2016-11-01 ENCOUNTER — Encounter: Payer: Self-pay | Admitting: Family Medicine

## 2016-11-01 VITALS — BP 158/52 | HR 55 | Temp 97.4°F | Ht 62.0 in | Wt 135.0 lb

## 2016-11-01 DIAGNOSIS — R1013 Epigastric pain: Secondary | ICD-10-CM | POA: Diagnosis not present

## 2016-11-01 NOTE — Patient Instructions (Signed)
Continue to take Carafate 4 times daily before meals and at bedtime for at least 2 more weeks then reduce this to twice daily before breakfast and supper unless there is a recurrence of the epigastric pain and then go back to 4 times daily before meals and at bedtime.  Continue to avoid greasy foods fried foods and of course do not take any NSAIDs like ibuprofen and Aleve

## 2016-11-01 NOTE — Progress Notes (Signed)
Subjective:    Patient ID: Elizabeth Burch, female    DOB: 05-14-1926, 82 y.o.   MRN: [id number]  HPI Pt here for hospital follow up from [facility] on 10/27/16. She was diagnosed with acute gastritis. This is a follow-up visit because of acute gastritis and epigastric pain. She was given a GI cocktail and Carafate tablets to take 4 times a day. She thinks the acute pain came from some eyedrops that were used during a procedure for her eyes. She is allergic to to the ingredients. With the GI cocktail she almost got immediate relief of the discomfort. Patient is feeling much better. She is really believing that this was medication that she got in the eyedrops during the procedure and apparently this was reviewed by the clinical pharmacists and she is allergic to to the ingredients that she received and eyedrops. She doesn't like taking the Carafate but will continue to take this. She did have an EKG during the ER visit. She has not seen any blood in the stool or had any black tarry bowel movements. For the past couple of days she has not had any more abdominal pain.    Patient Active Problem List   Diagnosis Date Noted  . Statin intolerance 03/19/2015  . Cardiomegaly 03/19/2015  . Osteoporosis 12/24/2013  . Kyphosis 11/03/2013  . Acute venous embolism and thrombosis of deep vessels of distal lower extremity ([place]) 08/26/2013  . 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 Fibrocystic disease of right breast 05/22/2013  . DVT of leg (deep venous thrombosis) ([place]) 02/16/2013  . Leg edema, left 09/16/2012  . Gout   . CKD (chronic kidney disease) 01/08/2012  . Multinodular thyroid 10/25/2011  . Fibrocystic breast disease, left.   . Open-angle glaucoma   . Hyperlipidemia 01/06/2009  . Essential hypertension 01/06/2009   Outpatient Encounter Prescriptions as of 11/01/2016  Medication Sig  . amLODipine (NORVASC) 10 MG tablet Take 1 tablet (10 mg total) by mouth daily.  . calcitRIOL  (ROCALTROL) 0.25 MCG capsule Take 0.25 mcg by mouth daily. Three times weekly  . colchicine (COLCRYS) 0.6 MG tablet Take 1 tablet (0.6 mg total) by mouth 2 (two) times daily as needed.  . dorzolamide-timolol (COSOPT) 22.3-6.8 MG/ML ophthalmic solution Place 1 drop into both eyes 2 (two) times daily.   . furosemide (LASIX) 40 MG tablet Take 40 mg by mouth daily.   Marland Kitchen losartan (COZAAR) 100 MG tablet TAKE ONE TABLET BY MOUTH ONCE DAILY  . metoprolol succinate (TOPROL-XL) 25 MG 24 hr tablet Take 1 tablet (25 mg total) by mouth daily.  . Omega-3 Fatty Acids (FISH OIL) 1000 MG CAPS Take 1,000 mg by mouth daily.  . ondansetron (ZOFRAN-ODT) 4 MG disintegrating tablet Take 1 tablet (4 mg total) by mouth every 8 (eight) hours as needed for nausea or vomiting.  . pantoprazole (PROTONIX) 40 MG tablet Take 40 mg by mouth 2 (two) times a week.  . sucralfate (CARAFATE) 1 g tablet Take 1 tablet by mouth 4 (four) times daily.  . TRAVATAN Z 0.004 % SOLN ophthalmic solution Place 1 drop into both eyes daily.  Marland Kitchen ULORIC 40 MG tablet TAKE ONE TABLET BY MOUTH ONCE DAILY  . warfarin (COUMADIN) 2 MG tablet TAKE ONE & ONE-HALF TABLETS BY MOUTH ONCE DAILY OR  AS  DIRECTED  BY  ANTICOAGULATION  CLINIC  . [DISCONTINUED] sucralfate (CARAFATE) 1 GM/10ML suspension Take 10 mLs (1 g total) by mouth 4 (four) times daily -  with meals  and at bedtime.   No facility-administered encounter medications on file as of 11/01/2016.       Review of Systems  Constitutional: Negative.   HENT: Negative.   Eyes: Negative.   Respiratory: Negative.   Cardiovascular: Negative.   Gastrointestinal: Positive for abdominal pain (epigastric pain --- getting better) and nausea.  Endocrine: Negative.   Genitourinary: Negative.   Musculoskeletal: Negative.   Skin: Negative.   Allergic/Immunologic: Negative.   Neurological: Negative.   Hematological: Negative.   Psychiatric/Behavioral: Negative.        Objective:   Physical Exam    Constitutional: She is oriented to person, place, and time. She appears well-developed and well-nourished. No distress.  HENT:  Head: Normocephalic and atraumatic.  Eyes: Conjunctivae and EOM are normal. Pupils are equal, round, and reactive to light. Right eye exhibits no discharge. Left eye exhibits no discharge. No scleral icterus.  Neck: Normal range of motion.  Cardiovascular: Normal rate and regular rhythm.   No murmur heard. Pulmonary/Chest: Effort normal and breath sounds normal. No respiratory distress. She has no wheezes. She has no rales.  Abdominal: Soft. Bowel sounds are normal. She exhibits no mass. There is no tenderness. There is no guarding.  Musculoskeletal: Normal range of motion.  Neurological: She is alert and oriented to person, place, and time.  Skin: Skin is warm and dry. No rash noted.  Psychiatric: She has a normal mood and affect. Her behavior is normal. Judgment and thought content normal.  Nursing note and vitals reviewed.  BP (!) 158/52 (BP Location: Left Arm)   Pulse (!) 55   Temp 97.4 F (36.3 C) (Oral)   Ht 5\' 2"  (1.575 m)   Wt 135 lb (61.2 kg)   BMI 24.69 kg/m         Assessment & Plan:  1. Abdominal pain, epigastric -The patient is much improved and has not had any further pain in the past couple days. -She will let her ophthalmologist be aware of the medications that potentially could have caused this discomfort. -She will continue with the Carafate 4 times daily at least for another couple of weeks and then reduce it to twice a day and then try to stop it at that time. -She will continue to watch her diet closely.  Patient Instructions  Continue to take Carafate 4 times daily before meals and at bedtime for at least 2 more weeks then reduce this to twice daily before breakfast and supper unless there is a recurrence of the epigastric pain and then go back to 4 times daily before meals and at bedtime.  Continue to avoid greasy foods fried  foods and of course do not take any NSAIDs like ibuprofen and Aleve   Arrie Senate MD

## 2016-11-03 ENCOUNTER — Other Ambulatory Visit: Payer: Self-pay | Admitting: Family Medicine

## 2016-11-13 ENCOUNTER — Ambulatory Visit (INDEPENDENT_AMBULATORY_CARE_PROVIDER_SITE_OTHER): Payer: Medicare Other | Admitting: Pharmacist

## 2016-11-13 DIAGNOSIS — Z86718 Personal history of other venous thrombosis and embolism: Secondary | ICD-10-CM

## 2016-11-13 DIAGNOSIS — I824Y3 Acute embolism and thrombosis of unspecified deep veins of proximal lower extremity, bilateral: Secondary | ICD-10-CM

## 2016-11-13 DIAGNOSIS — Z7901 Long term (current) use of anticoagulants: Secondary | ICD-10-CM | POA: Insufficient documentation

## 2016-11-13 LAB — COAGUCHEK XS/INR WAIVED
INR: 3 — ABNORMAL HIGH (ref 0.9–1.1)
Prothrombin Time: 35.7 s

## 2016-11-29 ENCOUNTER — Ambulatory Visit (INDEPENDENT_AMBULATORY_CARE_PROVIDER_SITE_OTHER): Payer: Medicare Other | Admitting: Pharmacist

## 2016-11-29 DIAGNOSIS — Z86718 Personal history of other venous thrombosis and embolism: Secondary | ICD-10-CM

## 2016-11-29 DIAGNOSIS — Z7901 Long term (current) use of anticoagulants: Secondary | ICD-10-CM

## 2016-11-29 DIAGNOSIS — I824Y3 Acute embolism and thrombosis of unspecified deep veins of proximal lower extremity, bilateral: Secondary | ICD-10-CM

## 2016-11-29 LAB — COAGUCHEK XS/INR WAIVED
INR: 2 — ABNORMAL HIGH (ref 0.9–1.1)
Prothrombin Time: 23.9 s

## 2016-12-05 ENCOUNTER — Other Ambulatory Visit: Payer: Self-pay | Admitting: Family Medicine

## 2016-12-26 ENCOUNTER — Other Ambulatory Visit: Payer: Medicare Other

## 2016-12-26 DIAGNOSIS — N184 Chronic kidney disease, stage 4 (severe): Secondary | ICD-10-CM

## 2016-12-26 DIAGNOSIS — I10 Essential (primary) hypertension: Secondary | ICD-10-CM

## 2016-12-26 DIAGNOSIS — E7800 Pure hypercholesterolemia, unspecified: Secondary | ICD-10-CM

## 2016-12-26 DIAGNOSIS — E559 Vitamin D deficiency, unspecified: Secondary | ICD-10-CM

## 2016-12-27 LAB — BMP8+EGFR
BUN/Creatinine Ratio: 24 (ref 12–28)
BUN: 42 mg/dL — ABNORMAL HIGH (ref 8–27)
CALCIUM: 9.1 mg/dL (ref 8.7–10.3)
CO2: 23 mmol/L (ref 18–29)
CREATININE: 1.74 mg/dL — AB (ref 0.57–1.00)
Chloride: 108 mmol/L — ABNORMAL HIGH (ref 96–106)
GFR, EST AFRICAN AMERICAN: 30 mL/min/{1.73_m2} — AB (ref >59)
GFR, EST NON AFRICAN AMERICAN: 26 mL/min/{1.73_m2} — AB (ref >59)
Glucose: 87 mg/dL (ref 65–99)
POTASSIUM: 4 mmol/L (ref 3.5–5.2)
Sodium: 146 mmol/L — ABNORMAL HIGH (ref 134–144)

## 2016-12-27 LAB — LIPID PANEL
CHOLESTEROL TOTAL: 199 mg/dL (ref 100–199)
Chol/HDL Ratio: 2.2 ratio units (ref 0.0–4.4)
HDL: 91 mg/dL (ref >39)
LDL Calculated: 97 mg/dL (ref 0–99)
Triglycerides: 54 mg/dL (ref 0–149)
VLDL CHOLESTEROL CAL: 11 mg/dL (ref 5–40)

## 2016-12-27 LAB — CBC WITH DIFFERENTIAL/PLATELET
BASOS: 0 %
Basophils Absolute: 0 10*3/uL (ref 0.0–0.2)
EOS (ABSOLUTE): 0.2 10*3/uL (ref 0.0–0.4)
EOS: 3 %
HEMATOCRIT: 31.9 % — AB (ref 34.0–46.6)
HEMOGLOBIN: 10.3 g/dL — AB (ref 11.1–15.9)
IMMATURE GRANS (ABS): 0 10*3/uL (ref 0.0–0.1)
IMMATURE GRANULOCYTES: 0 %
LYMPHS: 17 %
Lymphocytes Absolute: 1.3 10*3/uL (ref 0.7–3.1)
MCH: 30.6 pg (ref 26.6–33.0)
MCHC: 32.3 g/dL (ref 31.5–35.7)
MCV: 95 fL (ref 79–97)
Monocytes Absolute: 0.9 10*3/uL (ref 0.1–0.9)
Monocytes: 11 %
NEUTROS ABS: 5.5 10*3/uL (ref 1.4–7.0)
NEUTROS PCT: 69 %
PLATELETS: 227 10*3/uL (ref 150–379)
RBC: 3.37 x10E6/uL — ABNORMAL LOW (ref 3.77–5.28)
RDW: 14.6 % (ref 12.3–15.4)
WBC: 7.9 10*3/uL (ref 3.4–10.8)

## 2016-12-27 LAB — HEPATIC FUNCTION PANEL
ALK PHOS: 88 IU/L (ref 39–117)
ALT: 16 IU/L (ref 0–32)
AST: 18 IU/L (ref 0–40)
Albumin: 3.9 g/dL (ref 3.5–4.7)
BILIRUBIN TOTAL: 0.3 mg/dL (ref 0.0–1.2)
BILIRUBIN, DIRECT: 0.09 mg/dL (ref 0.00–0.40)
Total Protein: 5.8 g/dL — ABNORMAL LOW (ref 6.0–8.5)

## 2016-12-27 LAB — VITAMIN D 25 HYDROXY (VIT D DEFICIENCY, FRACTURES): VIT D 25 HYDROXY: 24.8 ng/mL — AB (ref 30.0–100.0)

## 2016-12-29 ENCOUNTER — Encounter: Payer: Self-pay | Admitting: Family Medicine

## 2016-12-29 ENCOUNTER — Ambulatory Visit (INDEPENDENT_AMBULATORY_CARE_PROVIDER_SITE_OTHER): Payer: Medicare Other | Admitting: Family Medicine

## 2016-12-29 ENCOUNTER — Ambulatory Visit (INDEPENDENT_AMBULATORY_CARE_PROVIDER_SITE_OTHER): Payer: Medicare Other

## 2016-12-29 VITALS — BP 168/74 | HR 59 | Temp 96.9°F | Ht 62.0 in | Wt 147.0 lb

## 2016-12-29 DIAGNOSIS — R710 Precipitous drop in hematocrit: Secondary | ICD-10-CM | POA: Diagnosis not present

## 2016-12-29 DIAGNOSIS — I10 Essential (primary) hypertension: Secondary | ICD-10-CM | POA: Diagnosis not present

## 2016-12-29 DIAGNOSIS — E559 Vitamin D deficiency, unspecified: Secondary | ICD-10-CM

## 2016-12-29 DIAGNOSIS — N184 Chronic kidney disease, stage 4 (severe): Secondary | ICD-10-CM | POA: Diagnosis not present

## 2016-12-29 DIAGNOSIS — Z86718 Personal history of other venous thrombosis and embolism: Secondary | ICD-10-CM

## 2016-12-29 DIAGNOSIS — R635 Abnormal weight gain: Secondary | ICD-10-CM | POA: Diagnosis not present

## 2016-12-29 DIAGNOSIS — Z7901 Long term (current) use of anticoagulants: Secondary | ICD-10-CM

## 2016-12-29 DIAGNOSIS — E7800 Pure hypercholesterolemia, unspecified: Secondary | ICD-10-CM

## 2016-12-29 LAB — COAGUCHEK XS/INR WAIVED
INR: 2.2 — AB (ref 0.9–1.1)
PROTHROMBIN TIME: 26.2 s

## 2016-12-29 MED ORDER — PANTOPRAZOLE SODIUM 40 MG PO TBEC: 40.0000 mg | DELAYED_RELEASE_TABLET | Freq: Every day | ORAL | 6 refills | Status: DC

## 2016-12-29 NOTE — Progress Notes (Signed)
Subjective:    Patient ID: Elizabeth Burch, female    DOB: August 26, 1926, 83 y.o.   MRN: [id number]  HPI Pt here for follow up and management of chronic medical problems which includes hyperlipidemia and hypertension. He is taking medication regularly.The patient is doing well with no specific complaints. She is not a complainer. She sees several specialists including the cardiologist the rheumatologist and the nephrologist. She is requesting a refill on her proton X. Her hemoglobin on her lab work that we will review with our was slightly decreased from previously. Her creatinine remains elevated as it has been in the past. Her last chest x-ray was almost 2 years ago we will most likely get one today because of the fluid gain that she's had with her weight going up by 12 pounds. The patient has had slightly more swelling but has had no shortness of breath and denies any chest pain. She denies any change in her bowel habits are intestinal symptoms. She's not seen any blood in the stool. She is passing her water without problems. The patient has not been checking her blood pressure at home recently.    Patient Active Problem List   Diagnosis Date Noted  . History of DVT of lower extremity 11/13/2016  . Chronic anticoagulation 11/13/2016  . Statin intolerance 03/19/2015  . Cardiomegaly 03/19/2015  . Osteoporosis 12/24/2013  . Kyphosis 11/03/2013  . Acute venous embolism and thrombosis of deep vessels of distal lower extremity ([facility]) 08/26/2013  . 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 Fibrocystic disease of right breast 05/22/2013  . DVT of leg (deep venous thrombosis) ([facility]) 02/16/2013  . Leg edema, left 09/16/2012  . Gout   . CKD (chronic kidney disease) 01/08/2012  . Multinodular thyroid 10/25/2011  . Fibrocystic breast disease, left.   . Open-angle glaucoma   . Hyperlipidemia 01/06/2009  . Essential hypertension 01/06/2009   Outpatient Encounter Prescriptions as of  12/29/2016  Medication Sig  . amLODipine (NORVASC) 10 MG tablet Take 1 tablet (10 mg total) by mouth daily.  . calcitRIOL (ROCALTROL) 0.25 MCG capsule Take 0.25 mcg by mouth daily. Three times weekly  . colchicine (COLCRYS) 0.6 MG tablet Take 1 tablet (0.6 mg total) by mouth 2 (two) times daily as needed.  . dorzolamide-timolol (COSOPT) 22.3-6.8 MG/ML ophthalmic solution Place 1 drop into both eyes 2 (two) times daily.   . furosemide (LASIX) 40 MG tablet Take 40 mg by mouth daily.   Marland Kitchen losartan (COZAAR) 100 MG tablet TAKE ONE TABLET BY MOUTH ONCE DAILY  . metoprolol succinate (TOPROL-XL) 25 MG 24 hr tablet Take 1 tablet (25 mg total) by mouth daily.  . Omega-3 Fatty Acids (FISH OIL) 1000 MG CAPS Take 1,000 mg by mouth daily.  . pantoprazole (PROTONIX) 40 MG tablet Take 1 tablet (40 mg total) by mouth daily.  . TRAVATAN Z 0.004 % SOLN ophthalmic solution Place 1 drop into both eyes daily.  Marland Kitchen ULORIC 40 MG tablet TAKE ONE TABLET BY MOUTH ONCE DAILY  . warfarin (COUMADIN) 2 MG tablet TAKE ONE & ONE-HALF TABLETS BY MOUTH ONCE DAILY OR  AS  DIRECTED  BY  ANTICOAGULATION  CLINIC  . [DISCONTINUED] pantoprazole (PROTONIX) 40 MG tablet Take 40 mg by mouth 2 (two) times a week.  . [DISCONTINUED] pantoprazole (PROTONIX) 40 MG tablet TAKE ONE TABLET BY MOUTH ONCE DAILY  . [DISCONTINUED] sucralfate (CARAFATE) 1 g tablet Take 1 tablet by mouth 4 (four) times daily.   No facility-administered encounter medications  on file as of 12/29/2016.       Review of Systems  Constitutional: Negative.   HENT: Negative.   Eyes: Negative.   Respiratory: Negative.   Cardiovascular: Negative.   Gastrointestinal: Negative.   Endocrine: Negative.   Genitourinary: Negative.   Musculoskeletal: Negative.   Skin: Negative.   Allergic/Immunologic: Negative.   Neurological: Negative.   Hematological: Negative.   Psychiatric/Behavioral: Negative.        Objective:   Physical Exam  Constitutional: She is oriented to  person, place, and time. She appears well-developed and well-nourished.  The patient is pleasant and alert and looks much younger than her stated age  HENT:  Head: Normocephalic and atraumatic.  Right Ear: External ear normal.  Left Ear: External ear normal.  Nose: Nose normal.  Mouth/Throat: Oropharynx is clear and moist. No oropharyngeal exudate.  Eyes: Conjunctivae and EOM are normal. Pupils are equal, round, and reactive to light. Right eye exhibits no discharge. Left eye exhibits no discharge. No scleral icterus.  Neck: Normal range of motion. Neck supple. No thyromegaly present.  Cardiovascular: Normal rate, regular rhythm and normal heart sounds.   No murmur heard. The heart is regular at 60/m  Pulmonary/Chest: Effort normal and breath sounds normal. No respiratory distress. She has no wheezes. She has no rales.  The chest was clear anteriorly and posteriorly  Abdominal: Soft. Bowel sounds are normal. She exhibits no mass. There is no tenderness. There is no rebound and no guarding.  No abdominal tenderness masses or organ enlargement  Musculoskeletal: Normal range of motion. She exhibits edema.  Slight pedal edema  Lymphadenopathy:    She has no cervical adenopathy.  Neurological: She is alert and oriented to person, place, and time. She has normal reflexes. No cranial nerve deficit.  Skin: Skin is warm and dry. No rash noted.  Psychiatric: She has a normal mood and affect. Her behavior is normal. Judgment and thought content normal.  Nursing note and vitals reviewed.  BP (!) 143/52 (BP Location: Left Arm)   Pulse (!) 59   Temp (!) 96.9 F (36.1 C) (Oral)   Ht 5\' 2"  (1.575 m)   Wt 147 lb (66.7 kg)   BMI 26.89 kg/m   Repeat blood pressure in the left arm was 168/74 with a regular cuff sitting  Chest x-ray today with results pending    Assessment & Plan:  1. Pure hypercholesterolemia -Continue with aggressive therapeutic lifestyle changes. Patient is statin intolerant.  Cholesterol numbers are improved from previous readings.  2. Vitamin D deficiency -Continue with vitamin D replacement and follow directions from nephrologist.  3. Chronic kidney disease (CKD), stage IV (severe) ([facility]) -Follow-up with Dr. Mercy  and take a copy of the blood work we just reviewed with you to him  4. Essential hypertension -Check home blood pressures more frequently and take these readings with you when you go see the cardiologist and the nephrologist  5. Weight gain -Watch sodium intake and recheck weight in the office in 4 weeks when you have the CBC repeated  6. Decreased hemoglobin -Return the FOBT card  Meds ordered this encounter  Medications  . pantoprazole (PROTONIX) 40 MG tablet    Sig: Take 1 tablet (40 mg total) by mouth daily.    Dispense:  30 tablet    Refill:  6    Please consider 90 day supplies to promote better adherence   Patient Instructions  Medicare Annual Wellness Visit  [facility] and the medical providers at [facility] strive to bring you the best medical care.  In doing so we not only want to address your current medical conditions and concerns but also to detect new conditions early and prevent illness, disease and health-related problems.    Medicare offers a yearly Wellness Visit which allows our clinical staff to assess your need for preventative services including immunizations, lifestyle education, counseling to decrease risk of preventable diseases and screening for fall risk and other medical concerns.    This visit is provided free of charge (no copay) for all Medicare recipients. The clinical pharmacists at [facility] have begun to conduct these Wellness Visits which will also include a thorough review of all your medications.    As you primary medical provider recommend that you make an appointment for your Annual Wellness Visit if you have not done so already  this year.  You may set up this appointment before you leave today or you may call back ([phone]) and schedule an appointment.  Please make sure when you call that you mention that you are scheduling your Annual Wellness Visit with the clinical pharmacist so that the appointment may be made for the proper length of time.     Continue current medications. Continue good therapeutic lifestyle changes which include good diet and exercise. Fall precautions discussed with patient. If an FOBT was given today- please return it to our front desk. If you are over 76 years old - you may need Prevnar 43 or the adult Pneumonia vaccine.  **Flu shots are available--- please call and schedule a FLU-CLINIC appointment**  After your visit with [place] today you will receive a survey in the mail or online from [employer] regarding your care with [place]. Please take a moment to fill this out. Your feedback is very important to [place] as you can help [place] better understand your patient needs as well as improve your experience and satisfaction. WE CARE ABOUT YOU!!!  Return the FOBT Repeat CBC in 4 weeks and reweigh patient. Reweigh in 4 weeks We will call with results of chest x-ray as soon as those results become available and the additional lab work that we did today. Continue wearing support hose and watch sodium intake Be sure and schedule a visit with nephrologist for follow-up We will schedule visit with cardiology here  Arrie Senate MD

## 2016-12-29 NOTE — Patient Instructions (Addendum)
Medicare Annual Wellness Visit  [facility] and the medical providers at [facility] strive to bring you the best medical care.  In doing so we not only want to address your current medical conditions and concerns but also to detect new conditions early and prevent illness, disease and health-related problems.    Medicare offers a yearly Wellness Visit which allows our clinical staff to assess your need for preventative services including immunizations, lifestyle education, counseling to decrease risk of preventable diseases and screening for fall risk and other medical concerns.    This visit is provided free of charge (no copay) for all Medicare recipients. The clinical pharmacists at [facility] have begun to conduct these Wellness Visits which will also include a thorough review of all your medications.    As you primary medical provider recommend that you make an appointment for your Annual Wellness Visit if you have not done so already this year.  You may set up this appointment before you leave today or you may call back ([phone]) and schedule an appointment.  Please make sure when you call that you mention that you are scheduling your Annual Wellness Visit with the clinical pharmacist so that the appointment may be made for the proper length of time.     Continue current medications. Continue good therapeutic lifestyle changes which include good diet and exercise. Fall precautions discussed with patient. If an FOBT was given today- please return it to our front desk. If you are over 55 years old - you may need Prevnar 57 or the adult Pneumonia vaccine.  **Flu shots are available--- please call and schedule a FLU-CLINIC appointment**  After your visit with [place] today you will receive a survey in the mail or online from [employer] regarding your care with [place]. Please take a moment to fill this out. Your feedback is very  important to [place] as you can help [place] better understand your patient needs as well as improve your experience and satisfaction. WE CARE ABOUT YOU!!!  Return the FOBT Repeat CBC in 4 weeks and reweigh patient. Reweigh in 4 weeks We will call with results of chest x-ray as soon as those results become available and the additional lab work that we did today. Continue wearing support hose and watch sodium intake Be sure and schedule a visit with nephrologist for follow-up We will schedule visit with cardiology here

## 2016-12-30 LAB — BRAIN NATRIURETIC PEPTIDE: BNP: 621 pg/mL — AB (ref 0.0–100.0)

## 2017-01-02 ENCOUNTER — Ambulatory Visit (INDEPENDENT_AMBULATORY_CARE_PROVIDER_SITE_OTHER): Payer: Medicare Other | Admitting: *Deleted

## 2017-01-02 VITALS — BP 184/64 | HR 63 | Wt 138.4 lb

## 2017-01-02 DIAGNOSIS — R635 Abnormal weight gain: Secondary | ICD-10-CM

## 2017-01-02 NOTE — Progress Notes (Signed)
Pt in for weight check Today her weight is 138, down 9 lbs from visit on 12/29/2016 BP remains elevated at 184/62   P63

## 2017-01-03 ENCOUNTER — Other Ambulatory Visit: Payer: Medicare Other

## 2017-01-03 DIAGNOSIS — Z1211 Encounter for screening for malignant neoplasm of colon: Secondary | ICD-10-CM

## 2017-01-04 LAB — FECAL OCCULT BLOOD, IMMUNOCHEMICAL: FECAL OCCULT BLD: NEGATIVE

## 2017-01-12 ENCOUNTER — Telehealth: Payer: Self-pay | Admitting: *Deleted

## 2017-01-12 NOTE — Telephone Encounter (Signed)
Pt was called today to discuss BP readings -- Dr Laurance Flatten and Dr Mercy Moore spoke this morning and aggreed that if pt BP was better - she would cont lasix 40 BID and we would not add on anything else at this time.   BP the last few days was better - per pt - Today was 129/55 and HR 46.  She is going [facility] for iron infusion and will rck BMP here on the 01/31/17.

## 2017-01-16 ENCOUNTER — Other Ambulatory Visit (HOSPITAL_COMMUNITY): Payer: Self-pay | Admitting: *Deleted

## 2017-01-17 ENCOUNTER — Ambulatory Visit (HOSPITAL_COMMUNITY)
Admission: RE | Admit: 2017-01-17 | Discharge: 2017-01-17 | Disposition: A | Discharge status: Home / Self Care | Payer: Medicare Other | Source: Ambulatory Visit | Attending: Nephrology | Admitting: Nephrology

## 2017-01-17 DIAGNOSIS — N189 Chronic kidney disease, unspecified: Secondary | ICD-10-CM | POA: Insufficient documentation

## 2017-01-17 DIAGNOSIS — D631 Anemia in chronic kidney disease: Secondary | ICD-10-CM | POA: Diagnosis not present

## 2017-01-17 MED ORDER — SODIUM CHLORIDE 0.9 % IV SOLN
510.0000 mg | Freq: Once | INTRAVENOUS | Status: AC
  Administered 2017-01-17: 510 mg via INTRAVENOUS
  Filled 2017-01-17: qty 17

## 2017-01-17 NOTE — Discharge Instructions (Signed)
Ferumoxytol injection What is this medicine? FERUMOXYTOL is an iron complex. Iron is used to make healthy red blood cells, which carry oxygen and nutrients throughout the body. This medicine is used to treat iron deficiency anemia in people with chronic kidney disease. COMMON BRAND NAME(S): Feraheme What should I tell my health care provider before I take this medicine? They need to know if you have any of these conditions: -anemia not caused by low iron levels -high levels of iron in the blood -magnetic resonance imaging (MRI) test scheduled -an unusual or allergic reaction to iron, other medicines, foods, dyes, or preservatives -pregnant or trying to get pregnant -breast-feeding How should I use this medicine? This medicine is for injection into a vein. It is given by a health care professional in a hospital or clinic setting. Talk to your pediatrician regarding the use of this medicine in children. Special care may be needed. What if I miss a dose? It is important not to miss your dose. Call your doctor or health care professional if you are unable to keep an appointment. What may interact with this medicine? This medicine may interact with the following medications: -other iron products What should I watch for while using this medicine? Visit your doctor or healthcare professional regularly. Tell your doctor or healthcare professional if your symptoms do not start to get better or if they get worse. You may need blood work done while you are taking this medicine. You may need to follow a special diet. Talk to your doctor. Foods that contain iron include: whole grains/cereals, dried fruits, beans, or peas, leafy green vegetables, and organ meats (liver, kidney). What side effects may I notice from receiving this medicine? Side effects that you should report to your doctor or health care professional as soon as possible: -allergic reactions like skin rash, itching or hives, swelling of the  face, lips, or tongue -breathing problems -changes in blood pressure -feeling faint or lightheaded, falls -fever or chills -flushing, sweating, or hot feelings -swelling of the ankles or feet Side effects that usually do not require medical attention (report to your doctor or health care professional if they continue or are bothersome): -diarrhea -headache -nausea, vomiting -stomach pain Where should I keep my medicine? This drug is given in a hospital or clinic and will not be stored at home.  2017 Elsevier/Gold Standard (2015-12-23 12:41:49)  

## 2017-01-25 ENCOUNTER — Other Ambulatory Visit: Payer: Medicare Other

## 2017-01-25 DIAGNOSIS — R710 Precipitous drop in hematocrit: Secondary | ICD-10-CM

## 2017-01-25 LAB — CBC WITH DIFFERENTIAL/PLATELET
BASOS ABS: 0 10*3/uL (ref 0.0–0.2)
Basos: 1 %
EOS (ABSOLUTE): 0.3 10*3/uL (ref 0.0–0.4)
EOS: 3 %
HEMATOCRIT: 36 % (ref 34.0–46.6)
HEMOGLOBIN: 11.8 g/dL (ref 11.1–15.9)
IMMATURE GRANULOCYTES: 0 %
Immature Grans (Abs): 0 10*3/uL (ref 0.0–0.1)
Lymphocytes Absolute: 2.3 10*3/uL (ref 0.7–3.1)
Lymphs: 28 %
MCH: 30.6 pg (ref 26.6–33.0)
MCHC: 32.8 g/dL (ref 31.5–35.7)
MCV: 93 fL (ref 79–97)
MONOCYTES: 10 %
Monocytes Absolute: 0.9 10*3/uL (ref 0.1–0.9)
NEUTROS PCT: 58 %
Neutrophils Absolute: 4.7 10*3/uL (ref 1.4–7.0)
Platelets: 250 10*3/uL (ref 150–379)
RBC: 3.86 x10E6/uL (ref 3.77–5.28)
RDW: 14.3 % (ref 12.3–15.4)
WBC: 8.2 10*3/uL (ref 3.4–10.8)

## 2017-01-26 ENCOUNTER — Encounter: Payer: Self-pay | Admitting: Family Medicine

## 2017-01-30 ENCOUNTER — Other Ambulatory Visit: Payer: Self-pay | Admitting: Family Medicine

## 2017-01-30 ENCOUNTER — Ambulatory Visit (INDEPENDENT_AMBULATORY_CARE_PROVIDER_SITE_OTHER): Payer: Medicare Other | Admitting: Pharmacist

## 2017-01-30 DIAGNOSIS — Z86718 Personal history of other venous thrombosis and embolism: Secondary | ICD-10-CM | POA: Diagnosis not present

## 2017-01-30 DIAGNOSIS — Z7901 Long term (current) use of anticoagulants: Secondary | ICD-10-CM | POA: Diagnosis not present

## 2017-01-30 DIAGNOSIS — I824Y3 Acute embolism and thrombosis of unspecified deep veins of proximal lower extremity, bilateral: Secondary | ICD-10-CM

## 2017-01-30 LAB — COAGUCHEK XS/INR WAIVED
INR: 2.2 — AB (ref 0.9–1.1)
PROTHROMBIN TIME: 26.3 s

## 2017-02-01 ENCOUNTER — Encounter: Payer: Self-pay | Admitting: Cardiology

## 2017-02-06 ENCOUNTER — Encounter: Payer: Self-pay | Admitting: Cardiology

## 2017-02-06 NOTE — Progress Notes (Signed)
Cardiology Office Note   Date:  02/07/2017   ID:  Elizabeth Burch, DOB [birth year], MRN [id number]  PCP:  Redge Gainer, MD  Cardiologist:   Minus Breeding, MD  Referring:  Redge Gainer, MD  Chief Complaint  Patient presents with  . DVT      History of Present Illness: Elizabeth Burch is a 83 y.o. female who presents with a history of spontaneous DVT in the left leg in 10/13 with negative hypercoagulable workup. She has been treated with Coumadin.   She was last seen by Dr. Aundra Dubin in 6/16.  She presents to me for the first time.  She gets along well.  She does her chores of daily living.  The patient denies any new symptoms such as chest discomfort, neck or arm discomfort. There has been no new shortness of breath, PND or orthopnea. There have been no reported palpitations, presyncope or syncope.  I do note that she had an elevated BNP late last year.  This was ordered because of leg swelling.  This was treated with increased diuresis.  She had gained 10 lbs and her symptoms improved.  She does have CKD but her creat has been stable even on the increased diuretic.  She never really had dyspnea as part of this.     Past Medical History:  Diagnosis Date  . Arthritis    Gout  . Cataract   . Chronic kidney disease, stage IV (severe) ([facility])   . Diverticulosis of colon (without mention of hemorrhage) 2008  . DVT (deep venous thrombosis) ([facility]) 1950  . External hemorrhoids without mention of complication 2458  . Fibrocystic breast disease   . GERD (gastroesophageal reflux disease) 2004  . Glaucoma   . Gout   . Hiatal hernia 2004  . HTN (hypertension)   . Multiple thyroid nodules   . Palpitations   . Peripheral vascular disease [facility])     Past Surgical History:  Procedure Laterality Date  . APPENDECTOMY    . CESAREAN SECTION    . CYSTECTOMY     on colon  . TONSILLECTOMY       Current Outpatient Prescriptions  Medication Sig Dispense Refill  . amLODipine (NORVASC) 10 MG  tablet Take 1 tablet (10 mg total) by mouth daily. 90 tablet 3  . calcitRIOL (ROCALTROL) 0.25 MCG capsule Take 0.25 mcg by mouth daily. Three times weekly    . colchicine (COLCRYS) 0.6 MG tablet Take 1 tablet (0.6 mg total) by mouth 2 (two) times daily as needed. 60 tablet 0  . dorzolamide-timolol (COSOPT) 22.3-6.8 MG/ML ophthalmic solution Place 1 drop into both eyes 2 (two) times daily.     . furosemide (LASIX) 40 MG tablet Take 40 mg by mouth daily.     Marland Kitchen losartan (COZAAR) 100 MG tablet TAKE ONE TABLET BY MOUTH ONCE DAILY 30 tablet 2  . metoprolol succinate (TOPROL-XL) 25 MG 24 hr tablet Take 1 tablet (25 mg total) by mouth daily. 30 tablet 11  . Omega-3 Fatty Acids (FISH OIL) 1000 MG CAPS Take 1,000 mg by mouth daily.    . TRAVATAN Z 0.004 % SOLN ophthalmic solution Place 1 drop into both eyes daily.  3  . ULORIC 40 MG tablet TAKE ONE TABLET BY MOUTH ONCE DAILY 30 tablet 5  . warfarin (COUMADIN) 2 MG tablet TAKE ONE & ONE-HALF TABLETS BY MOUTH ONCE DAILY OR  AS  DIRECTED  BY  ANTICOAGULATION  CLINIC 135 tablet 3  . pantoprazole (PROTONIX)  40 MG tablet Take one 40 mg tablet twice weekly     No current facility-administered medications for this visit.     Allergies:   Prednisone; Vigamox [moxifloxacin]; Ace inhibitors; Allopurinol; Alendronate sodium; Ciprofloxacin; Clarithromycin; Clindamycin/lincomycin; Nitrofurantoin; Ofloxacin; Penicillins; Sulfamethoxazole; and Sulfonamide derivatives    ROS:  Please see the history of present illness.   Otherwise, review of systems are positive for none.   All other systems are reviewed and negative.    PHYSICAL EXAM: VS:  BP (!) 178/68   Pulse 60   Ht 5\' 2"  (1.575 m)   Wt 138 lb (62.6 kg)   BMI 25.24 kg/m  , BMI Body mass index is 25.24 kg/m. GENERAL:  Well appearing HEENT:  Pupils equal round and reactive, fundi not visualized, oral mucosa unremarkable NECK:  No jugular venous distention, waveform within normal limits, carotid upstroke brisk  and symmetric, no bruits, no thyromegaly LYMPHATICS:  No cervical, inguinal adenopathy LUNGS:  Clear to auscultation bilaterally BACK:  No CVA tenderness CHEST:  Unremarkable HEART:  PMI not displaced or sustained,S1 and S2 within normal limits, no S3, no S4, no clicks, no rubs, 2/6 brief systolic murmur at the apex, no diastolic murmurs ABD:  Flat, positive bowel sounds normal in frequency in pitch, no bruits, no rebound, no guarding, no midline pulsatile mass, no hepatomegaly, no splenomegaly EXT:  2 plus pulses throughout, trace edema, no cyanosis no clubbing SKIN:  No rashes no nodules NEURO:  Cranial nerves II through XII grossly intact, motor grossly intact throughout PSYCH:  Cognitively intact, oriented to person place and time    EKG:  EKG is ordered today. The ekg ordered today demonstrates NSR, rate 60, PVCs, no acute ST T wave changes.     Recent Labs: 10/27/2016: Hemoglobin 12.6 12/26/2016: ALT 16; BUN 42; Creatinine, Ser 1.74; Potassium 4.0; Sodium 146 12/29/2016: BNP 621.0 01/25/2017: Platelets 250    Lipid Panel    Component Value Date/Time   CHOL 199 12/26/2016 1313   CHOL 223 (H) 02/04/2014 0807   TRIG 54 12/26/2016 1313   TRIG 121 08/21/2016 0800   TRIG 82 02/04/2014 0807   HDL 91 12/26/2016 1313   HDL 93 08/21/2016 0800   HDL 84 02/04/2014 0807   CHOLHDL 2.2 12/26/2016 1313   LDLCALC 97 12/26/2016 1313   LDLCALC 121 (H) 06/23/2014 0806   LDLCALC 123 (H) 02/04/2014 0807      Wt Readings from Last 3 Encounters:  02/07/17 138 lb (62.6 kg)  01/17/17 138 lb (62.6 kg)  01/02/17 138 lb 6.4 oz (62.8 kg)      Other studies Reviewed: Additional studies/ records that were reviewed today include: Office labs. Review of the above records demonstrates:  Please see elsewhere in the note.     ASSESSMENT AND PLAN:  HTN:  BP is high here but her BP at home is well controlled and she kept a BP diary that I reviewed.   HYPERLIPIDEMIA:  This is followed closely  by Dr. Laurance Flatten  CKD:  Creat was 1.74 which has been stable.  No change in therapy is planned.   DVT:  I agree that she should continue to have lifetime anticoagulation with an unprovoked DVT.  She could not afford a DOAC and she will remain on warfarin.    EDEMA:  She did have an elevated BNP and edema recently.  However, she was not having SOB.  This has resolved.  I don't think that further cardiac testing is indicated.  She will  remain on the increased dose of diuretic.    Current medicines are reviewed at length with the patient today.  The patient does not have concerns regarding medicines.  The following changes have been made:  no change  Labs/ tests ordered today include: None  Orders Placed This Encounter  Procedures  . EKG 12-Lead     Disposition:   FU with me in one year.     Signed, Minus Breeding, MD  02/07/2017 9:03 PM    [facility]

## 2017-02-07 ENCOUNTER — Encounter: Payer: Self-pay | Admitting: Cardiology

## 2017-02-07 ENCOUNTER — Ambulatory Visit (INDEPENDENT_AMBULATORY_CARE_PROVIDER_SITE_OTHER): Payer: Medicare Other | Admitting: Cardiology

## 2017-02-07 VITALS — BP 178/68 | HR 60 | Ht 62.0 in | Wt 138.0 lb

## 2017-02-07 DIAGNOSIS — M7989 Other specified soft tissue disorders: Secondary | ICD-10-CM | POA: Diagnosis not present

## 2017-02-07 DIAGNOSIS — I10 Essential (primary) hypertension: Secondary | ICD-10-CM

## 2017-02-07 DIAGNOSIS — I82499 Acute embolism and thrombosis of other specified deep vein of unspecified lower extremity: Secondary | ICD-10-CM

## 2017-02-07 NOTE — Patient Instructions (Signed)
Medication Instructions:  The current medical regimen is effective;  continue present plan and medications.  Follow-Up: Follow up in 1 year with Dr. Percival Spanish.  You will receive a letter in the mail 2 months before you are due.  Please call us when you receive this letter to schedule your follow up appointment.  If you need a refill on your cardiac medications before your next appointment, please call your pharmacy.  Thank you for choosing [facility]!!

## 2017-03-05 ENCOUNTER — Other Ambulatory Visit: Payer: Self-pay | Admitting: Family Medicine

## 2017-03-05 DIAGNOSIS — Z1231 Encounter for screening mammogram for malignant neoplasm of breast: Secondary | ICD-10-CM

## 2017-03-06 ENCOUNTER — Encounter: Payer: Self-pay | Admitting: Pharmacist

## 2017-03-12 ENCOUNTER — Ambulatory Visit (INDEPENDENT_AMBULATORY_CARE_PROVIDER_SITE_OTHER): Payer: Medicare Other | Admitting: Pharmacist

## 2017-03-12 DIAGNOSIS — Z86718 Personal history of other venous thrombosis and embolism: Secondary | ICD-10-CM | POA: Diagnosis not present

## 2017-03-12 DIAGNOSIS — Z7901 Long term (current) use of anticoagulants: Secondary | ICD-10-CM | POA: Diagnosis not present

## 2017-03-12 LAB — COAGUCHEK XS/INR WAIVED
INR: 2.9 — ABNORMAL HIGH (ref 0.9–1.1)
PROTHROMBIN TIME: 34.4 s

## 2017-03-29 ENCOUNTER — Encounter: Payer: Self-pay | Admitting: Family Medicine

## 2017-03-29 ENCOUNTER — Ambulatory Visit (INDEPENDENT_AMBULATORY_CARE_PROVIDER_SITE_OTHER): Payer: Medicare Other | Admitting: Family Medicine

## 2017-03-29 VITALS — BP 138/66 | HR 56 | Temp 96.5°F | Ht 62.0 in | Wt 137.0 lb

## 2017-03-29 DIAGNOSIS — J209 Acute bronchitis, unspecified: Secondary | ICD-10-CM

## 2017-03-29 MED ORDER — CEFDINIR 300 MG PO CAPS: 300.0000 mg | ORAL_CAPSULE | Freq: Two times a day (BID) | ORAL | 0 refills | Status: DC

## 2017-03-29 MED ORDER — ALBUTEROL SULFATE HFA 108 (90 BASE) MCG/ACT IN AERS: 2.0000 | INHALATION_SPRAY | Freq: Four times a day (QID) | RESPIRATORY_TRACT | 0 refills | Status: DC | PRN

## 2017-03-29 NOTE — Progress Notes (Signed)
BP 138/66   Pulse (!) 56   Temp (!) 96.5 F (35.8 C) (Oral)   Ht 5\' 2"  (1.575 m)   Wt 137 lb (62.1 kg)   SpO2 96%   BMI 25.06 kg/m    Subjective:    Patient ID: Elizabeth Burch, female    DOB: [birth year], 83 y.o.   MRN: [id number]  HPI: Elizabeth Burch is a 83 y.o. female presenting on 03/29/2017 for Cough, Wheezing (x 1 week, no congestion that she feels)   HPI Cough and wheezing and congestion Patient has been having cough and wheezing and congestion that's been going on for one week. She says wheezing just got worse over the past couple days and that is what brought her in. She said the cough is actually improved and she is not having it near as much she was previously. She denies any fevers or chills or shortness of breath. She has been wheezing and it is especially worse at night. She has never been diagnosed with COPD or asthma in the past. She says she's never had wheezing like this previously.  Relevant past medical, surgical, family and social history reviewed and updated as indicated. Interim medical history since our last visit reviewed. Allergies and medications reviewed and updated.  Review of Systems  Constitutional: Negative for chills and fever.  HENT: Positive for congestion, postnasal drip, rhinorrhea, sinus pressure, sneezing and sore throat. Negative for ear discharge and ear pain.   Eyes: Negative for pain, redness and visual disturbance.  Respiratory: Positive for cough and wheezing. Negative for chest tightness and shortness of breath.   Cardiovascular: Negative for chest pain and leg swelling.  Genitourinary: Negative for difficulty urinating and dysuria.  Musculoskeletal: Negative for back pain and gait problem.  Skin: Negative for rash.  Neurological: Negative for light-headedness and headaches.  Psychiatric/Behavioral: Negative for agitation and behavioral problems.  All other systems reviewed and are negative.   Per HPI unless specifically  indicated above     Objective:    BP 138/66   Pulse (!) 56   Temp (!) 96.5 F (35.8 C) (Oral)   Ht 5\' 2"  (1.575 m)   Wt 137 lb (62.1 kg)   SpO2 96%   BMI 25.06 kg/m   Wt Readings from Last 3 Encounters:  03/29/17 137 lb (62.1 kg)  02/07/17 138 lb (62.6 kg)  01/17/17 138 lb (62.6 kg)    Physical Exam  Constitutional: She is oriented to person, place, and time. She appears well-developed and well-nourished. No distress.  HENT:  Right Ear: Tympanic membrane, external ear and ear canal normal.  Left Ear: Tympanic membrane, external ear and ear canal normal.  Nose: Mucosal edema and rhinorrhea present. No epistaxis. Right sinus exhibits no maxillary sinus tenderness and no frontal sinus tenderness. Left sinus exhibits no maxillary sinus tenderness and no frontal sinus tenderness.  Mouth/Throat: Uvula is midline and mucous membranes are normal. Posterior oropharyngeal edema and posterior oropharyngeal erythema present. No oropharyngeal exudate or tonsillar abscesses.  Eyes: Conjunctivae are normal.  Cardiovascular: Normal rate, regular rhythm, normal heart sounds and intact distal pulses.   No murmur heard. Pulmonary/Chest: Effort normal. No respiratory distress. She has wheezes (Expiratory wheezes, greater in upper lobes).  Musculoskeletal: Normal range of motion. She exhibits no edema or tenderness.  Neurological: She is alert and oriented to person, place, and time. Coordination normal.  Skin: Skin is warm and dry. No rash noted. She is not diaphoretic.  Psychiatric: She has a normal  mood and affect. Her behavior is normal.  Vitals reviewed.     Assessment & Plan:   Problem List Items Addressed This Visit    None    Visit Diagnoses    Acute bronchitis, unspecified organism    -  Primary   Relevant Medications   cefdinir (OMNICEF) 300 MG capsule   albuterol (PROVENTIL HFA;VENTOLIN HFA) 108 (90 Base) MCG/ACT inhaler       Follow up plan: Return if symptoms worsen or fail  to improve.  Counseling provided for all of the vaccine components No orders of the defined types were placed in this encounter.   Caryl Pina, MD [facility] 03/29/2017, 2:45 PM

## 2017-04-02 ENCOUNTER — Other Ambulatory Visit: Payer: Medicare Other

## 2017-04-02 DIAGNOSIS — I10 Essential (primary) hypertension: Secondary | ICD-10-CM

## 2017-04-02 DIAGNOSIS — N184 Chronic kidney disease, stage 4 (severe): Secondary | ICD-10-CM

## 2017-04-02 DIAGNOSIS — E559 Vitamin D deficiency, unspecified: Secondary | ICD-10-CM

## 2017-04-02 DIAGNOSIS — E7800 Pure hypercholesterolemia, unspecified: Secondary | ICD-10-CM

## 2017-04-03 LAB — BMP8+EGFR
BUN / CREAT RATIO: 21 (ref 12–28)
BUN: 33 mg/dL — ABNORMAL HIGH (ref 8–27)
CHLORIDE: 108 mmol/L — AB (ref 96–106)
CO2: 24 mmol/L (ref 18–29)
CREATININE: 1.55 mg/dL — AB (ref 0.57–1.00)
Calcium: 10 mg/dL (ref 8.7–10.3)
GFR calc Af Amer: 34 mL/min/{1.73_m2} — ABNORMAL LOW (ref >59)
GFR calc non Af Amer: 30 mL/min/{1.73_m2} — ABNORMAL LOW (ref >59)
GLUCOSE: 91 mg/dL (ref 65–99)
Potassium: 4.7 mmol/L (ref 3.5–5.2)
SODIUM: 146 mmol/L — AB (ref 134–144)

## 2017-04-03 LAB — LIPID PANEL
CHOLESTEROL TOTAL: 209 mg/dL — AB (ref 100–199)
Chol/HDL Ratio: 2.5 ratio (ref 0.0–4.4)
HDL: 85 mg/dL (ref >39)
LDL CALC: 101 mg/dL — AB (ref 0–99)
TRIGLYCERIDES: 113 mg/dL (ref 0–149)
VLDL CHOLESTEROL CAL: 23 mg/dL (ref 5–40)

## 2017-04-03 LAB — CBC WITH DIFFERENTIAL/PLATELET
BASOS ABS: 0 10*3/uL (ref 0.0–0.2)
Basos: 0 %
EOS (ABSOLUTE): 0.2 10*3/uL (ref 0.0–0.4)
Eos: 2 %
HEMOGLOBIN: 11.9 g/dL (ref 11.1–15.9)
Hematocrit: 37.3 % (ref 34.0–46.6)
IMMATURE GRANS (ABS): 0 10*3/uL (ref 0.0–0.1)
Immature Granulocytes: 0 %
LYMPHS ABS: 1.8 10*3/uL (ref 0.7–3.1)
Lymphs: 16 %
MCH: 30.1 pg (ref 26.6–33.0)
MCHC: 31.9 g/dL (ref 31.5–35.7)
MCV: 94 fL (ref 79–97)
MONOS ABS: 0.8 10*3/uL (ref 0.1–0.9)
Monocytes: 7 %
NEUTROS ABS: 8 10*3/uL — AB (ref 1.4–7.0)
Neutrophils: 75 %
PLATELETS: 318 10*3/uL (ref 150–379)
RBC: 3.95 x10E6/uL (ref 3.77–5.28)
RDW: 14.8 % (ref 12.3–15.4)
WBC: 10.8 10*3/uL (ref 3.4–10.8)

## 2017-04-03 LAB — VITAMIN D 25 HYDROXY (VIT D DEFICIENCY, FRACTURES): VIT D 25 HYDROXY: 20.8 ng/mL — AB (ref 30.0–100.0)

## 2017-04-03 LAB — HEPATIC FUNCTION PANEL
ALBUMIN: 4 g/dL (ref 3.5–4.7)
ALT: 20 IU/L (ref 0–32)
AST: 20 IU/L (ref 0–40)
Alkaline Phosphatase: 109 IU/L (ref 39–117)
Bilirubin Total: 0.4 mg/dL (ref 0.0–1.2)
Bilirubin, Direct: 0.13 mg/dL (ref 0.00–0.40)
TOTAL PROTEIN: 6.3 g/dL (ref 6.0–8.5)

## 2017-04-05 ENCOUNTER — Ambulatory Visit (INDEPENDENT_AMBULATORY_CARE_PROVIDER_SITE_OTHER): Payer: Medicare Other | Admitting: Family Medicine

## 2017-04-05 ENCOUNTER — Encounter: Payer: Self-pay | Admitting: Family Medicine

## 2017-04-05 VITALS — BP 134/48 | HR 54 | Temp 97.4°F | Ht 62.0 in | Wt 133.0 lb

## 2017-04-05 DIAGNOSIS — Z86718 Personal history of other venous thrombosis and embolism: Secondary | ICD-10-CM

## 2017-04-05 DIAGNOSIS — N184 Chronic kidney disease, stage 4 (severe): Secondary | ICD-10-CM | POA: Diagnosis not present

## 2017-04-05 DIAGNOSIS — I824Y3 Acute embolism and thrombosis of unspecified deep veins of proximal lower extremity, bilateral: Secondary | ICD-10-CM | POA: Diagnosis not present

## 2017-04-05 DIAGNOSIS — E7800 Pure hypercholesterolemia, unspecified: Secondary | ICD-10-CM

## 2017-04-05 DIAGNOSIS — I10 Essential (primary) hypertension: Secondary | ICD-10-CM | POA: Diagnosis not present

## 2017-04-05 DIAGNOSIS — E559 Vitamin D deficiency, unspecified: Secondary | ICD-10-CM | POA: Diagnosis not present

## 2017-04-05 DIAGNOSIS — Z7901 Long term (current) use of anticoagulants: Secondary | ICD-10-CM

## 2017-04-05 LAB — COAGUCHEK XS/INR WAIVED
INR: 3.8 — AB (ref 0.9–1.1)
PROTHROMBIN TIME: 45.6 s

## 2017-04-05 NOTE — Patient Instructions (Addendum)
Medicare Annual Wellness Visit  [facility] and the medical providers at [facility] strive to bring you the best medical care.  In doing so we not only want to address your current medical conditions and concerns but also to detect new conditions early and prevent illness, disease and health-related problems.    Medicare offers a yearly Wellness Visit which allows our clinical staff to assess your need for preventative services including immunizations, lifestyle education, counseling to decrease risk of preventable diseases and screening for fall risk and other medical concerns.    This visit is provided free of charge (no copay) for all Medicare recipients. The clinical pharmacists at [facility] have begun to conduct these Wellness Visits which will also include a thorough review of all your medications.    As you primary medical provider recommend that you make an appointment for your Annual Wellness Visit if you have not done so already this year.  You may set up this appointment before you leave today or you may call back [phone]) and schedule an appointment.  Please make sure when you call that you mention that you are scheduling your Annual Wellness Visit with the clinical pharmacist so that the appointment may be made for the proper length of time.     Continue current medications. Continue good therapeutic lifestyle changes which include good diet and exercise. Fall precautions discussed with patient. If an FOBT was given today- please return it to our front desk. If you are over 48 years old - you may need Prevnar 25 or the adult Pneumonia vaccine.  **Flu shots are available--- please call and schedule a FLU-CLINIC appointment**  After your visit with [place] today you will receive a survey in the mail or online from [employer] regarding your care with [place]. Please take a moment to fill this out. Your feedback is very  important to [place] as you can help [place] better understand your patient needs as well as improve your experience and satisfaction. WE CARE ABOUT YOU!!!   Consider getting a Lifeline to wear around her neck. We will talk to your son about this Stay well hydrated Keep follow-up appointments with ophthalmology cardiology and nephrology Don't do any climbing Move slowly

## 2017-04-05 NOTE — Progress Notes (Signed)
Subjective:    Patient ID: Elizabeth Burch, female    DOB: 1926/05/23, 83 y.o.   MRN: [id number]  HPI Pt here for follow up and management of chronic medical problems which includes hyperlipidemia and hypertension. She is taking medication regularly.The patient is doing well overall and has no specific complaints today. She just lost her husband that she was very close to food been in declining health for several weeks. This patient has multiple medical issues most importantly her chronic kidney disease. She also has a history of acute thrombosis of the lower extremities and DVT. She is statin intolerant and followed regularly by the nephrologist because of chronic kidney disease. She has had recent lab work done and this will be reviewed with the patient during the visit. Her LDL C was elevated at 101 with a normal triglyceride. The HDL however is excellent. The CBC had a normal white blood cell count with a good hemoglobin that is stable for her at 11.9. Platelet count is adequate. Creatinine remains elevated but lower than it had been in the past at 1.55. The blood sugar was good at 91 and her serum sodium and chloride were slightly elevated but they've also been this way in the past and we will continue to monitor them. All liver function tests were normal. The vitamin D level was 20.8. We will make sure that she discusses this with the nephrologist at her next visit as to how much vitamin D that she can take above and beyond what she is currently taking. The patient denies any chest pain or shortness of breath. She denies any trouble with her stomach including nausea vomiting diarrhea blood in the stool or black tarry bowel movements. She is passing her water well. She just saw the rheumatologist yesterday and he will not see her again for 1 year. She sees Dr. Mercy  every 6 months. He is her nephrologist. She also sees the cardiologist regularly and has regular appointments for her eyes with the  ophthalmologist in [place].    Patient Active Problem List   Diagnosis Date Noted  . History of DVT of lower extremity 11/13/2016  . Chronic anticoagulation 11/13/2016  . Statin intolerance 03/19/2015  . Cardiomegaly 03/19/2015  . Osteoporosis 12/24/2013  . Kyphosis 11/03/2013  . Acute venous embolism and thrombosis of deep vessels of distal lower extremity ([facility]) 08/26/2013  . 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 Fibrocystic disease of right breast 05/22/2013  . DVT of leg (deep venous thrombosis) ([place]) 02/16/2013  . Leg edema, left 09/16/2012  . Gout   . CKD (chronic kidney disease) 01/08/2012  . Multinodular thyroid 10/25/2011  . Fibrocystic breast disease, left.   . Open-angle glaucoma   . Hyperlipidemia 01/06/2009  . Essential hypertension 01/06/2009   Outpatient Encounter Prescriptions as of 04/05/2017  Medication Sig  . albuterol (PROVENTIL HFA;VENTOLIN HFA) 108 (90 Base) MCG/ACT inhaler Inhale 2 puffs into the lungs every 6 (six) hours as needed for wheezing or shortness of breath.  Marland Kitchen amLODipine (NORVASC) 10 MG tablet Take 1 tablet (10 mg total) by mouth daily.  . calcitRIOL (ROCALTROL) 0.25 MCG capsule Take 0.25 mcg by mouth daily. Three times weekly  . cefdinir (OMNICEF) 300 MG capsule Take 1 capsule (300 mg total) by mouth 2 (two) times daily. 1 po BID  . colchicine (COLCRYS) 0.6 MG tablet Take 1 tablet (0.6 mg total) by mouth 2 (two) times daily as needed.  . dorzolamide-timolol (COSOPT) 22.3-6.8 MG/ML ophthalmic solution Place  1 drop into both eyes 2 (two) times daily.   . furosemide (LASIX) 40 MG tablet Take 40 mg by mouth daily.   Marland Kitchen losartan (COZAAR) 100 MG tablet TAKE ONE TABLET BY MOUTH ONCE DAILY  . metoprolol succinate (TOPROL-XL) 25 MG 24 hr tablet Take 1 tablet (25 mg total) by mouth daily.  . Omega-3 Fatty Acids (FISH OIL) 1000 MG CAPS Take 1,000 mg by mouth daily.  . pantoprazole (PROTONIX) 40 MG tablet Take one 40 mg tablet twice weekly  .  TRAVATAN Z 0.004 % SOLN ophthalmic solution Place 1 drop into both eyes daily.  Marland Kitchen ULORIC 40 MG tablet TAKE ONE TABLET BY MOUTH ONCE DAILY  . warfarin (COUMADIN) 2 MG tablet TAKE ONE & ONE-HALF TABLETS BY MOUTH ONCE DAILY OR  AS  DIRECTED  BY  ANTICOAGULATION  CLINIC   No facility-administered encounter medications on file as of 04/05/2017.       Review of Systems  Constitutional: Negative.   HENT: Negative.   Eyes: Negative.   Respiratory: Negative.   Cardiovascular: Negative.   Gastrointestinal: Negative.   Endocrine: Negative.   Genitourinary: Negative.   Musculoskeletal: Negative.   Skin: Negative.   Allergic/Immunologic: Negative.   Neurological: Negative.   Hematological: Negative.   Psychiatric/Behavioral: Negative.        Objective:   Physical Exam  Constitutional: She is oriented to person, place, and time. She appears well-developed and well-nourished.  Patient is pleasant and calm and alert despite losing her husband recently  HENT:  Head: Normocephalic and atraumatic.  Right Ear: External ear normal.  Left Ear: External ear normal.  Nose: Nose normal.  Mouth/Throat: Oropharynx is clear and moist. No oropharyngeal exudate.  Eyes: Conjunctivae and EOM are normal. Pupils are equal, round, and reactive to light. Right eye exhibits no discharge. Left eye exhibits no discharge. No scleral icterus.  Neck: Normal range of motion. Neck supple. No thyromegaly present.  Cardiovascular: Regular rhythm, normal heart sounds and intact distal pulses.   No murmur heard. The heart was regular at 52/m  Pulmonary/Chest: Effort normal and breath sounds normal. No respiratory distress. She has no wheezes. She has no rales.  Clear anteriorly and posteriorly  Abdominal: Soft. Bowel sounds are normal. She exhibits no mass. There is no tenderness. There is no rebound and no guarding.  No abdominal tenderness masses or bruits  Musculoskeletal: Normal range of motion. She exhibits no  edema.  Lymphadenopathy:    She has no cervical adenopathy.  Neurological: She is alert and oriented to person, place, and time. She has normal reflexes. No cranial nerve deficit.  Skin: Skin is warm and dry. No rash noted.  Psychiatric: She has a normal mood and affect. Her behavior is normal. Judgment and thought content normal.  Nursing note and vitals reviewed.  BP (!) 134/48 (BP Location: Left Arm)   Pulse (!) 54   Temp 97.4 F (36.3 C) (Oral)   Ht 5\' 2"  (1.575 m)   Wt 133 lb (60.3 kg)   BMI 24.33 kg/m         Assessment & Plan:  1. Pure hypercholesterolemia -The patient has a very good HDL cholesterol and she is statin intolerant and her LDL C is only slightly elevated and she will continue with aggressive therapeutic lifestyle changes  2. Vitamin D deficiency -She is currently taking vitamin D as recommended by her nephrologist and will discuss whether she can increase the dose anymore at the next visit she has with him  3. Chronic kidney disease (CKD), stage IV (severe) (HCC) -Continue to avoid NSAIDs and follow-up with nephrology regularly  4. Essential hypertension -Blood pressure is good today and she will continue with current treatment  5. Chronic anticoagulation -She will get her pro time today for her DVT issues.  6. Deep vein thrombosis (DVT) of proximal vein of both lower extremities, unspecified chronicity (HCC) -Regular pro times and will monitor this regularly. Patient Instructions                       Medicare Annual Wellness Visit  [facility] and the medical providers at [facility] strive to bring you the best medical care.  In doing so we not only want to address your current medical conditions and concerns but also to detect new conditions early and prevent illness, disease and health-related problems.    Medicare offers a yearly Wellness Visit which allows our clinical staff to assess your need for preventative services  including immunizations, lifestyle education, counseling to decrease risk of preventable diseases and screening for fall risk and other medical concerns.    This visit is provided free of charge (no copay) for all Medicare recipients. The clinical pharmacists at [facility] have begun to conduct these Wellness Visits which will also include a thorough review of all your medications.    As you primary medical provider recommend that you make an appointment for your Annual Wellness Visit if you have not done so already this year.  You may set up this appointment before you leave today or you may call back ([phone]) and schedule an appointment.  Please make sure when you call that you mention that you are scheduling your Annual Wellness Visit with the clinical pharmacist so that the appointment may be made for the proper length of time.     Continue current medications. Continue good therapeutic lifestyle changes which include good diet and exercise. Fall precautions discussed with patient. If an FOBT was given today- please return it to our front desk. If you are over 58 years old - you may need Prevnar 64 or the adult Pneumonia vaccine.  **Flu shots are available--- please call and schedule a FLU-CLINIC appointment**  After your visit with [place] today you will receive a survey in the mail or online from [employer] regarding your care with [place]. Please take a moment to fill this out. Your feedback is very important to [place] as you can help [place] better understand your patient needs as well as improve your experience and satisfaction. WE CARE ABOUT YOU!!!   Consider getting a Lifeline to wear around her neck. We will talk to your son about this Stay well hydrated Keep follow-up appointments with ophthalmology cardiology and nephrology Don't do any climbing Move slowly    Arrie Senate MD

## 2017-04-12 ENCOUNTER — Ambulatory Visit (INDEPENDENT_AMBULATORY_CARE_PROVIDER_SITE_OTHER): Payer: Medicare Other | Admitting: Pharmacist

## 2017-04-12 DIAGNOSIS — Z7901 Long term (current) use of anticoagulants: Secondary | ICD-10-CM | POA: Diagnosis not present

## 2017-04-12 DIAGNOSIS — Z86718 Personal history of other venous thrombosis and embolism: Secondary | ICD-10-CM | POA: Diagnosis not present

## 2017-04-12 LAB — COAGUCHEK XS/INR WAIVED
INR: 2.4 — ABNORMAL HIGH (ref 0.9–1.1)
PROTHROMBIN TIME: 28.9 s

## 2017-04-14 ENCOUNTER — Other Ambulatory Visit: Payer: Self-pay | Admitting: Family Medicine

## 2017-04-16 ENCOUNTER — Ambulatory Visit
Admission: RE | Admit: 2017-04-16 | Discharge: 2017-04-16 | Disposition: A | Discharge status: Home / Self Care | Payer: Medicare Other | Source: Ambulatory Visit | Attending: Family Medicine | Admitting: Family Medicine

## 2017-04-16 DIAGNOSIS — Z1231 Encounter for screening mammogram for malignant neoplasm of breast: Secondary | ICD-10-CM

## 2017-04-26 ENCOUNTER — Ambulatory Visit (INDEPENDENT_AMBULATORY_CARE_PROVIDER_SITE_OTHER): Payer: Medicare Other | Admitting: Pharmacist

## 2017-04-26 DIAGNOSIS — Z86718 Personal history of other venous thrombosis and embolism: Secondary | ICD-10-CM | POA: Diagnosis not present

## 2017-04-26 DIAGNOSIS — Z7901 Long term (current) use of anticoagulants: Secondary | ICD-10-CM

## 2017-04-26 LAB — COAGUCHEK XS/INR WAIVED
INR: 1.8 — ABNORMAL HIGH (ref 0.9–1.1)
Prothrombin Time: 21.2 s

## 2017-05-16 ENCOUNTER — Other Ambulatory Visit: Payer: Self-pay | Admitting: Family Medicine

## 2017-05-22 ENCOUNTER — Encounter: Payer: Self-pay | Admitting: Pharmacist

## 2017-05-22 ENCOUNTER — Ambulatory Visit (INDEPENDENT_AMBULATORY_CARE_PROVIDER_SITE_OTHER): Payer: Medicare Other | Admitting: Pharmacist

## 2017-05-22 VITALS — BP 140/58 | HR 56 | Ht 62.0 in | Wt 136.0 lb

## 2017-05-22 DIAGNOSIS — Z7901 Long term (current) use of anticoagulants: Secondary | ICD-10-CM

## 2017-05-22 DIAGNOSIS — Z0000 Encounter for general adult medical examination without abnormal findings: Secondary | ICD-10-CM

## 2017-05-22 DIAGNOSIS — Z86718 Personal history of other venous thrombosis and embolism: Secondary | ICD-10-CM

## 2017-05-22 LAB — COAGUCHEK XS/INR WAIVED
INR: 3.4 — ABNORMAL HIGH (ref 0.9–1.1)
PROTHROMBIN TIME: 41.3 s

## 2017-05-22 NOTE — Progress Notes (Signed)
Patient ID: Elizabeth Burch, female   DOB: 08-18-1926, 83 y.o.   MRN: [id number]     Subjective:   Elizabeth Burch is a 83 y.o. female who presents for a subsequent Medicare Annual Wellness Visit and to check INR.  Patient is currently taking warfarin 4mg  or 2 tablets on Monday and thrudays.  Take 1 and 1/2 tablets or 3mg  all other days.  Denies s/s of bleeding No recent med changes.   Social History: Lives in [place], [place].  Has a brother and sister that live close. 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  1 adult child; 2 grandchildren and 4 great grandchildren. Gardening.  Attends church regularly. 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  Alcohol/Tobacco/Substances: none   Current Medications (verified) Outpatient Encounter Prescriptions as of 05/22/2017  Medication Sig  . albuterol (PROVENTIL HFA;VENTOLIN HFA) 108 (90 Base) MCG/ACT inhaler Inhale 2 puffs into the lungs every 6 (six) hours as needed for wheezing or shortness of breath.  Marland Kitchen amLODipine (NORVASC) 10 MG tablet Take 1 tablet (10 mg total) by mouth daily.  . calcitRIOL (ROCALTROL) 0.25 MCG capsule Take 0.25 mcg by mouth daily. Three times weekly  . colchicine (COLCRYS) 0.6 MG tablet Take 1 tablet (0.6 mg total) by mouth 2 (two) times daily as needed.  . dorzolamide-timolol (COSOPT) 22.3-6.8 MG/ML ophthalmic solution Place 1 drop into both eyes 2 (two) times daily.   . furosemide (LASIX) 40 MG tablet Take 40 mg by mouth daily.   Marland Kitchen losartan (COZAAR) 100 MG tablet TAKE 1 TABLET BY MOUTH ONCE DAILY  . metoprolol succinate (TOPROL-XL) 25 MG 24 hr tablet Take 1 tablet (25 mg total) by mouth daily.  . Omega-3 Fatty Acids (FISH OIL) 1000 MG CAPS Take 1,000 mg by mouth daily.  . pantoprazole (PROTONIX) 40 MG tablet Take one 40 mg tablet twice weekly  . TRAVATAN Z 0.004 % SOLN ophthalmic solution Place 1 drop into both eyes daily.  Marland Kitchen ULORIC 40 MG  tablet TAKE ONE TABLET BY MOUTH ONCE DAILY  . warfarin (COUMADIN) 2 MG tablet TAKE ONE & ONE-HALF TABLETS BY MOUTH ONCE DAILY OR  AS  DIRECTED  BY  ANTICOAGULATION  CLINIC   No facility-administered encounter medications on file as of 05/22/2017.     Allergies (verified) Prednisone; Vigamox [moxifloxacin]; Allopurinol; Ace inhibitors; Alendronate sodium; Ciprofloxacin; Clarithromycin; Clindamycin/lincomycin; Nitrofurantoin; Ofloxacin; Penicillins; Sulfamethoxazole; and Sulfonamide derivatives   History: Past Medical History:  Diagnosis Date  . Arthritis    Gout  . Cataract   . Chronic kidney disease, stage IV (severe) ([place])   . Diverticulosis of colon (without mention of hemorrhage) 2008  . DVT (deep venous thrombosis) ([place]) 1950  . External hemorrhoids without mention of complication 3299  . Fibrocystic breast disease   . GERD (gastroesophageal reflux disease) 2004  . Glaucoma   . Gout   . Hiatal hernia 2004  . HTN (hypertension)   . Multiple thyroid nodules   . Palpitations   . Peripheral vascular disease [facility])    Past Surgical History:  Procedure Laterality Date  . APPENDECTOMY    . BREAST EXCISIONAL BIOPSY Bilateral 1975  . CESAREAN SECTION    . CYSTECTOMY     on colon  . TONSILLECTOMY     Family History  Problem Relation Age of Onset  . Cancer Mother        pt Francene Boyers of the origin  . Breast cancer Sister   .  Cancer Sister        breast  . Heart disease Brother   . Heart attack Brother   . Breast cancer Sister   . Cancer Sister        breast  . Breast cancer Sister   . Cancer Sister        breast  . Cirrhosis Brother   . Diabetes Brother    Social History   Occupational History  . Retired  Retired   Social History Main Topics  . Smoking status: Never Smoker  . Smokeless tobacco: Never Used  . Alcohol use No  . Drug use: No  . Sexual activity: No    Do you feel safe at home?  Yes Are there smokers in your home (other than you)? 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 She also limits pinto beans because it causes gout attacks.  Eats lots of fruits - peaches, strawberries, melons.    Objective:    Today's Vitals   05/22/17 1147  BP: (!) 140/58  Pulse: (!) 56  Weight: 136 lb (61.7 kg)  Height: 5\' 2"  (1.575 m)  PainSc: 0-No pain   Body mass index is 24.87 kg/m.   INR was 3.4 today in office.  Activities of Daily Living In your present state of health, do you have any difficulty performing the following activities: 05/22/2017 05/22/2016  Hearing? N N  Vision? N N  Difficulty concentrating or making decisions? N N  Walking or climbing stairs? N N  Dressing or bathing? N N  Doing errands, shopping? N N  Preparing Food and eating ? N N  Using the Toilet? N N  In the past six months, have you accidently leaked urine? N N  Do you have problems with loss of bowel control? N N  Managing your Medications? N N  Managing your Finances? N N  Housekeeping or managing your Housekeeping? N N  Some recent data might be hidden     Cardiac Risk Factors include: advanced age (>40men, >6 women);dyslipidemia;hypertension  Depression Screen PHQ 2/9 Scores 05/22/2017 04/05/2017 03/29/2017 12/29/2016  PHQ - 2 Score 0 0 0 0     Fall Risk Fall Risk  05/22/2017 04/05/2017 03/29/2017 12/29/2016 11/01/2016  Falls in the past year? No No No No No  Number falls in past yr: - - - - -  Injury with Fall? - - - - -    Cognitive Function: MMSE - Mini Mental State Exam 05/22/2017 05/22/2016 05/22/2016 04/19/2015 04/19/2015  Orientation to time 4 5 5 5 5   Orientation to Place 5 5 5 5 5   Registration 3 3 3 3 3   Attention/ Calculation 5 5 5 5 5   Recall 3 3 3 3 3   Language- name 2 objects 2 2 - 2 2  Language- repeat 1 1 - 1 1    Language- follow 3 step command 3 3 - 3 3  Language- read & follow direction 1 1 - 1 1  Write a sentence 1 1 - 1 -  Copy design 1 1 - 1 -  Total score 29 30 - 30 -    Immunizations and Health Maintenance Immunization History  Administered Date(s) Administered  . Influenza,inj,Quad PF,36+ Mos 10/23/2013   There are no preventive care reminders to  display for this patient.  Patient Care Team: Chipper Herb, MD as PCP - General (Family Medicine) Alphonsa Overall, MD (General Surgery) Fleet Contras, MD (Nephrology) Hennie Duos, MD (Rheumatology) Minus Breeding, MD as Consulting Physician (Cardiology) Wilford Corner, MD as Consulting Physician (Ophthalmology)  Indicate any recent Medical Services you may have received from other than [facility] providers in the past year (date may be approximate).    Assessment:    Annual Wellness Visit  Supratherapuetic anticoagulation   Screening Tests Health Maintenance  Topic Date Due  . INFLUENZA VACCINE  12/07/2017 (Originally 07/04/2017)  . PAP SMEAR  10/31/2019 (Originally 09/22/2016)  . PNA vac Low Risk Adult (1 of 2 - PCV13) 12/07/2020 (Originally 10/20/1993)  . MAMMOGRAM  04/16/2018  . DEXA SCAN  09/13/2018  . TETANUS/TDAP  07/04/2021        Plan:   During the course of the visit Alexei was educated and counseled about the following appropriate screening and preventive services:   Vaccines to include Pneumoccal, Influenza, Td and Shingles - patient declined influenza, prevnar 37 and Singrix vaccines  Colorectal cancer screening - Last was 2008; per report no further studies recommended due to patient age unless having GI problems  Cardiovascular disease screening - UTD  Diabetes screening - UTD  Bone Denisty / Osteoporosis Screening - UTD; patietn declined pharmacotherapy  Mammogram - UTD  Glaucoma screening /  Eye Exam - UTD  Nutrition counseling - continue current diet  Advanced Directives - last updated  03/25/2017  Physical Activity - continue to be active - recommend 150 minutes of physical activity per week .   Anticoagulation Warfarin Dose Instructions as of 05/22/2017      [name] Tue Wed Thu Fri Sat   New Dose 3 mg 3 mg 3 mg 3 mg 4 mg 3 mg 3 mg    Description   No warfarin today - June 19th, 2018. Then decrease warfarin dose to 2 tablets on Thursdays.  Take 1 and 1/2 all other days.  INR was 3.4 today    RTC in 2-3 weeks.  Patient Instructions (the written plan) were given to the patient.   Cherre Robins, PharmD   05/22/2017

## 2017-05-22 NOTE — Patient Instructions (Addendum)
  Ms. Elizabeth Burch , Thank you for taking time to come for your Medicare Wellness Visit. I appreciate your ongoing commitment to your health goals. Please review the following plan we discussed and let me know if I can assist you in the future.   These are the goals we discussed:  Increase non-starchy vegetables - carrots, green bean, squash, zucchini, tomatoes, onions, peppers, spinach and other green leafy vegetables, cabbage, lettuce, cucumbers, asparagus, okra (not fried), eggplant Limit sugar and processed foods (cakes, cookies, ice cream, crackers and chips) Increase fresh fruit but limit serving sizes 1/2 cup or about the size of tennis or baseball Limit red meat to no more than 1-2 times per week (serving size about the size of your palm) Choose whole grains / lean proteins - whole wheat bread, quinoa, whole grain rice (1/2 cup), fish, chicken, [place] Avoid sugar and calorie containing beverages - soda, sweet tea and juice.  Choose water or unsweetened tea instead.  Increase exercise and try stay active - goal is to get 150 minutes of physical activity per week.    This is a list of the screening recommended for you and due dates:  Health Maintenance  Topic Date Due  . Flu Shot  12/07/2017*  . Pneumonia vaccines (1 of 2 - PCV13) 12/07/2020*  . Mammogram  04/16/2018  . DEXA scan (bone density measurement)  09/13/2018  . Tetanus Vaccine  07/04/2021  *Topic was postponed. The date shown is not the original due date.

## 2017-06-12 ENCOUNTER — Ambulatory Visit (INDEPENDENT_AMBULATORY_CARE_PROVIDER_SITE_OTHER): Payer: Medicare Other | Admitting: Pharmacist

## 2017-06-12 DIAGNOSIS — Z7901 Long term (current) use of anticoagulants: Secondary | ICD-10-CM | POA: Diagnosis not present

## 2017-06-12 DIAGNOSIS — Z86718 Personal history of other venous thrombosis and embolism: Secondary | ICD-10-CM | POA: Diagnosis not present

## 2017-06-12 LAB — COAGUCHEK XS/INR WAIVED
INR: 2.6 — AB (ref 0.9–1.1)
PROTHROMBIN TIME: 31.2 s

## 2017-07-10 ENCOUNTER — Ambulatory Visit (INDEPENDENT_AMBULATORY_CARE_PROVIDER_SITE_OTHER): Payer: Medicare Other | Admitting: Pharmacist

## 2017-07-10 DIAGNOSIS — Z7901 Long term (current) use of anticoagulants: Secondary | ICD-10-CM | POA: Diagnosis not present

## 2017-07-10 DIAGNOSIS — Z86718 Personal history of other venous thrombosis and embolism: Secondary | ICD-10-CM | POA: Diagnosis not present

## 2017-07-10 LAB — COAGUCHEK XS/INR WAIVED
INR: 1.9 — ABNORMAL HIGH (ref 0.9–1.1)
PROTHROMBIN TIME: 22.3 s

## 2017-07-10 NOTE — Patient Instructions (Signed)
Anticoagulation Warfarin Dose Instructions as of 07/10/2017      [name] Tue Wed Thu Fri Sat   New Dose 3 mg 3 mg 3 mg 3 mg 4 mg 3 mg 3 mg    Description   Take extra 1/2 tablet today only - Tuesday, August 7th.  Then then continue current warfarin dose  - Take 2 tablets on Thursdays.  Take 1 and 1/2 all other days.  INR was 1.9 today

## 2017-07-16 ENCOUNTER — Other Ambulatory Visit: Payer: Self-pay | Admitting: Family Medicine

## 2017-07-31 ENCOUNTER — Encounter: Payer: Self-pay | Admitting: *Deleted

## 2017-08-10 ENCOUNTER — Ambulatory Visit: Payer: Medicare Other | Admitting: Family Medicine

## 2017-08-22 ENCOUNTER — Other Ambulatory Visit: Payer: Medicare Other

## 2017-08-22 DIAGNOSIS — I10 Essential (primary) hypertension: Secondary | ICD-10-CM

## 2017-08-22 DIAGNOSIS — E7800 Pure hypercholesterolemia, unspecified: Secondary | ICD-10-CM

## 2017-08-22 DIAGNOSIS — E559 Vitamin D deficiency, unspecified: Secondary | ICD-10-CM

## 2017-08-22 DIAGNOSIS — N184 Chronic kidney disease, stage 4 (severe): Secondary | ICD-10-CM

## 2017-08-23 LAB — LIPID PANEL
CHOL/HDL RATIO: 2.9 ratio (ref 0.0–4.4)
Cholesterol, Total: 238 mg/dL — ABNORMAL HIGH (ref 100–199)
HDL: 83 mg/dL (ref >39)
LDL CALC: 134 mg/dL — AB (ref 0–99)
TRIGLYCERIDES: 103 mg/dL (ref 0–149)
VLDL Cholesterol Cal: 21 mg/dL (ref 5–40)

## 2017-08-23 LAB — HEPATIC FUNCTION PANEL
ALT: 12 IU/L (ref 0–32)
AST: 20 IU/L (ref 0–40)
Albumin: 4.5 g/dL (ref 3.5–4.7)
Alkaline Phosphatase: 79 IU/L (ref 39–117)
Bilirubin Total: 0.6 mg/dL (ref 0.0–1.2)
Bilirubin, Direct: 0.15 mg/dL (ref 0.00–0.40)
TOTAL PROTEIN: 6.5 g/dL (ref 6.0–8.5)

## 2017-08-23 LAB — BMP8+EGFR
BUN / CREAT RATIO: 21 (ref 12–28)
BUN: 34 mg/dL — ABNORMAL HIGH (ref 8–27)
CHLORIDE: 109 mmol/L — AB (ref 96–106)
CO2: 22 mmol/L (ref 20–29)
Calcium: 10.2 mg/dL (ref 8.7–10.3)
Creatinine, Ser: 1.62 mg/dL — ABNORMAL HIGH (ref 0.57–1.00)
GFR calc Af Amer: 32 mL/min/{1.73_m2} — ABNORMAL LOW (ref >59)
GFR calc non Af Amer: 28 mL/min/{1.73_m2} — ABNORMAL LOW (ref >59)
Glucose: 86 mg/dL (ref 65–99)
POTASSIUM: 4.4 mmol/L (ref 3.5–5.2)
SODIUM: 145 mmol/L — AB (ref 134–144)

## 2017-08-23 LAB — CBC WITH DIFFERENTIAL/PLATELET
Basophils Absolute: 0.1 10*3/uL (ref 0.0–0.2)
Basos: 1 %
EOS (ABSOLUTE): 0.3 10*3/uL (ref 0.0–0.4)
Eos: 4 %
Hematocrit: 36.9 % (ref 34.0–46.6)
Hemoglobin: 12.3 g/dL (ref 11.1–15.9)
IMMATURE GRANULOCYTES: 0 %
Immature Grans (Abs): 0 10*3/uL (ref 0.0–0.1)
LYMPHS ABS: 1.8 10*3/uL (ref 0.7–3.1)
Lymphs: 29 %
MCH: 31.1 pg (ref 26.6–33.0)
MCHC: 33.3 g/dL (ref 31.5–35.7)
MCV: 93 fL (ref 79–97)
MONOS ABS: 0.7 10*3/uL (ref 0.1–0.9)
Monocytes: 10 %
NEUTROS PCT: 56 %
Neutrophils Absolute: 3.5 10*3/uL (ref 1.4–7.0)
PLATELETS: 280 10*3/uL (ref 150–379)
RBC: 3.96 x10E6/uL (ref 3.77–5.28)
RDW: 13.7 % (ref 12.3–15.4)
WBC: 6.3 10*3/uL (ref 3.4–10.8)

## 2017-08-23 LAB — VITAMIN D 25 HYDROXY (VIT D DEFICIENCY, FRACTURES): VIT D 25 HYDROXY: 21.1 ng/mL — AB (ref 30.0–100.0)

## 2017-08-27 ENCOUNTER — Ambulatory Visit (INDEPENDENT_AMBULATORY_CARE_PROVIDER_SITE_OTHER): Payer: Medicare Other | Admitting: Family Medicine

## 2017-08-27 ENCOUNTER — Encounter: Payer: Self-pay | Admitting: Family Medicine

## 2017-08-27 VITALS — BP 157/53 | HR 55 | Temp 97.0°F | Ht 62.0 in | Wt 136.0 lb

## 2017-08-27 DIAGNOSIS — I824Y3 Acute embolism and thrombosis of unspecified deep veins of proximal lower extremity, bilateral: Secondary | ICD-10-CM | POA: Diagnosis not present

## 2017-08-27 DIAGNOSIS — Z86718 Personal history of other venous thrombosis and embolism: Secondary | ICD-10-CM

## 2017-08-27 DIAGNOSIS — E559 Vitamin D deficiency, unspecified: Secondary | ICD-10-CM

## 2017-08-27 DIAGNOSIS — Z7901 Long term (current) use of anticoagulants: Secondary | ICD-10-CM

## 2017-08-27 DIAGNOSIS — E7800 Pure hypercholesterolemia, unspecified: Secondary | ICD-10-CM | POA: Diagnosis not present

## 2017-08-27 DIAGNOSIS — I10 Essential (primary) hypertension: Secondary | ICD-10-CM

## 2017-08-27 DIAGNOSIS — N184 Chronic kidney disease, stage 4 (severe): Secondary | ICD-10-CM | POA: Diagnosis not present

## 2017-08-27 LAB — COAGUCHEK XS/INR WAIVED
INR: 2.4 — AB (ref 0.9–1.1)
PROTHROMBIN TIME: 28.5 s

## 2017-08-27 NOTE — Progress Notes (Signed)
Subjective:    Patient ID: Elizabeth Burch, female    DOB: June 22, 1926, 83 y.o.   MRN: [id number]  HPI Pt here for follow up and management of chronic medical problems which includes hyperlipidemia and hypertension. She is taking medication regularly.This [place] is had blood work done and this will be reviewed with her during the visit today. Her LDL is more elevated than in the past at 134. The good cholesterol remains excellent at 83 and triglycerides are good at 103. The blood sugar was good at 86. The creatinine remains slightly elevated at 1.62 and previously was 1.55. The potassium was good but the sodium and chloride were also slightly elevated. The vitamin D level was decreased but no changes will be made in this because of her elevated creatinine. All liver function tests were normal. The patient sees the nephrologist, Dr. Mercy Moore regularly. She also sees the rheumatologist Dr. Amil Amen in the cardiologist Dr. Percival Spanish. She has a history of DVT and we'll need to be on anticoagulants indefinitely. She also has gout chronic kidney disease and hyperlipidemia. The patient is doing well and in a good mood and seems to be doing well with the loss of her husband. She denies any chest pain or shortness of breath. She denies any trouble with swallowing heartburn indigestion nausea vomiting diarrhea blood in the stool or black tarry bowel movements. She is passing her water without problems. She is trying to stay is active as possible. She sees the rheumatologist the nephrologist and the cardiologist periodically and recently just saw the nephrologist. Her pro time today was 2.4 and she will continue with her current dose of Coumadin. She is a gets her pro times monitored every 6 weeks.    Patient Active Problem List   Diagnosis Date Noted  . History of DVT of lower extremity 11/13/2016  . Chronic anticoagulation 11/13/2016  . Statin intolerance 03/19/2015  . Cardiomegaly 03/19/2015  . Osteoporosis  12/24/2013  . Kyphosis 11/03/2013  . Acute venous embolism and thrombosis of deep vessels of distal lower extremity ([place]) 08/26/2013  . 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 Fibrocystic disease of right breast 05/22/2013  . DVT of leg (deep venous thrombosis) ([place]) 02/16/2013  . Leg edema, left 09/16/2012  . Gout   . CKD (chronic kidney disease) 01/08/2012  . Multinodular thyroid 10/25/2011  . Fibrocystic breast disease, left.   . Open-angle glaucoma   . Hyperlipidemia 01/06/2009  . Essential hypertension 01/06/2009   Outpatient Encounter Prescriptions as of 08/27/2017  Medication Sig  . albuterol (PROVENTIL HFA;VENTOLIN HFA) 108 (90 Base) MCG/ACT inhaler Inhale 2 puffs into the lungs every 6 (six) hours as needed for wheezing or shortness of breath.  Marland Kitchen amLODipine (NORVASC) 10 MG tablet Take 1 tablet (10 mg total) by mouth daily.  . colchicine (COLCRYS) 0.6 MG tablet Take 1 tablet (0.6 mg total) by mouth 2 (two) times daily as needed.  . dorzolamide-timolol (COSOPT) 22.3-6.8 MG/ML ophthalmic solution Place 1 drop into both eyes 2 (two) times daily.   . furosemide (LASIX) 40 MG tablet Take 40 mg by mouth daily.   Marland Kitchen losartan (COZAAR) 100 MG tablet TAKE 1 TABLET BY MOUTH ONCE DAILY  . metoprolol succinate (TOPROL-XL) 25 MG 24 hr tablet Take 1 tablet (25 mg total) by mouth daily.  . Omega-3 Fatty Acids (FISH OIL) 1000 MG CAPS Take 1,000 mg by mouth daily.  . pantoprazole (PROTONIX) 40 MG tablet Take one 40 mg tablet twice weekly  .  TRAVATAN Z 0.004 % SOLN ophthalmic solution Place 1 drop into both eyes daily.  Marland Kitchen ULORIC 40 MG tablet TAKE ONE TABLET BY MOUTH ONCE DAILY  . warfarin (COUMADIN) 2 MG tablet TAKE ONE & ONE-HALF TABLETS BY MOUTH ONCE DAILY OR  AS  DIRECTED  BY  ANTICOAGULATION  CLINIC  . [DISCONTINUED] calcitRIOL (ROCALTROL) 0.25 MCG capsule Take 0.25 mcg by mouth daily. Three times weekly   No facility-administered encounter medications on file as of 08/27/2017.        Review of Systems  Constitutional: Negative.   HENT: Negative.   Eyes: Negative.   Respiratory: Negative.   Cardiovascular: Negative.   Gastrointestinal: Negative.   Endocrine: Negative.   Genitourinary: Negative.   Musculoskeletal: Negative.   Skin: Negative.   Allergic/Immunologic: Negative.   Neurological: Negative.   Hematological: Negative.   Psychiatric/Behavioral: Negative.        Objective:   Physical Exam  Constitutional: She is oriented to person, place, and time. She appears well-developed and well-nourished. No distress.  The patient is kind and pleasant and looks much younger than her stated age of 62 years  HENT:  Head: Normocephalic and atraumatic.  Right Ear: External ear normal.  Left Ear: External ear normal.  Nose: Nose normal.  Mouth/Throat: Oropharynx is clear and moist.  Eyes: Pupils are equal, round, and reactive to light. Conjunctivae and EOM are normal. Right eye exhibits no discharge. Left eye exhibits no discharge. No scleral icterus.  Neck: Normal range of motion. Neck supple. No thyromegaly present.  No bruits thyromegaly or anterior cervical adenopathy  Cardiovascular: Normal rate, regular rhythm, normal heart sounds and intact distal pulses.   No murmur heard. Heart is regular at 72/m  Pulmonary/Chest: Effort normal and breath sounds normal. No respiratory distress. She has no wheezes. She has no rales.  Clear anteriorly and posteriorly  Abdominal: Soft. Bowel sounds are normal. She exhibits no mass. There is no tenderness. There is no rebound and no guarding.  No abdominal tenderness masses bruits or organ enlargement  Musculoskeletal: Normal range of motion. She exhibits no edema or tenderness.  Lymphadenopathy:    She has no cervical adenopathy.  Neurological: She is alert and oriented to person, place, and time. She has normal reflexes. No cranial nerve deficit.  Skin: Skin is warm and dry. No rash noted.  Psychiatric: She has a  normal mood and affect. Her behavior is normal. Judgment and thought content normal.  Nursing note and vitals reviewed.   BP (!) 157/53 (BP Location: Left Arm)   Pulse (!) 55   Temp (!) 97 F (36.1 C) (Oral)   Ht 5\' 2"  (1.575 m)   Wt 136 lb (61.7 kg)   BMI 24.87 kg/m       Assessment & Plan:  1. History of DVT of lower extremity -This patient will have to be on anticoagulation therapy for her chronic DVT -Her INR today was 2.4 she will continue with her current treatment regimen and we will return to the office in about 6 weeks for repeat pro time. - CoaguChek XS/INR Waived  2. Pure hypercholesterolemia -The patient is statin intolerant. Her cholesterol numbers are more elevated and she says she has not been sticking to her diet as closely as she should we'll try to do better with this.  3. Vitamin D deficiency -Because of her renal insufficiency, she will not be taking any vitamin D per direction of her nephrologist.  4. Chronic kidney disease (CKD), stage IV (severe) (  [place]) -The patient's most recent creatinine remains elevated but not as high as it was 8 months ago. She recently saw the nephrologist. She will continue to follow-up with the nephrologist.  5. Essential hypertension -The systolic blood pressure was slightly elevated today and she will continue to monitor this at home while watching her sodium intake closely  6. Deep vein thrombosis (DVT) of proximal vein of both lower extremities, unspecified chronicity ([facility]) -Continue with Coumadin treatment  7. Chronic anticoagulation -Continue with current Coumadin treatment and recheck pro time in about 6 weeks, she has no history of any adverse reactions from taking the Coumadin   Patient Instructions   Continue to follow-up with cardiology and nephrology and rheumatology. Also to follow-up with ophthalmology as planned Take a copy of your blood work with you to see the specialist in case they are not able to see this  on the computer. Monitor blood pressures regularly at home and take readings with you to your doctors visits.   Anticoagulation Warfarin Dose Instructions as of 08/27/2017      [name] Tue Wed Thu Fri Sat   New Dose 3 mg 3 mg 3 mg 3 mg 4 mg 3 mg 3 mg    Description   Then then continue current warfarin dose  - Take 2 tablets on Thursdays.  Take 1 and 1/2 all other days.  INR was 2.4 today Recheck in 4 weeks   or 6 weeks    Arrie Senate MD

## 2017-08-27 NOTE — Patient Instructions (Addendum)
Continue to follow-up with cardiology and nephrology and rheumatology. Also to follow-up with ophthalmology as planned Take a copy of your blood work with you to see the specialist in case they are not able to see this on the computer. Monitor blood pressures regularly at home and take readings with you to your doctors visits.   Anticoagulation Warfarin Dose Instructions as of 08/27/2017      Elizabeth Burch Tue Wed Thu Fri Sat   New Dose 3 mg 3 mg 3 mg 3 mg 4 mg 3 mg 3 mg    Description   Then then continue current warfarin dose  - Take 2 tablets on Thursdays.  Take 1 and 1/2 all other days.  INR was 2.4 today Recheck in 4 weeks   or 6 weeks

## 2017-09-03 ENCOUNTER — Other Ambulatory Visit: Payer: Medicare Other

## 2017-09-04 ENCOUNTER — Telehealth: Payer: Self-pay | Admitting: *Deleted

## 2017-09-04 NOTE — Telephone Encounter (Signed)
Please get confirmation of this from the patient's cardiologist and proceed as he recommends

## 2017-09-04 NOTE — Telephone Encounter (Signed)
Pt scheduled to have laser and injections in R eye at office of Dr Merlene Morse Is it okay for pt to stop Coumadin from Oct 13-19 If so, please fax to Dr Francesca Oman office  [phone] Please call Melissa at Dr Francesca Oman office to confirm [phone]

## 2017-09-07 ENCOUNTER — Encounter: Payer: Self-pay | Admitting: Pharmacist Clinician (PhC)/ Clinical Pharmacy Specialist

## 2017-09-10 ENCOUNTER — Other Ambulatory Visit: Payer: Self-pay | Admitting: *Deleted

## 2017-09-10 MED ORDER — LOSARTAN POTASSIUM 100 MG PO TABS: 100.0000 mg | ORAL_TABLET | Freq: Every day | ORAL | 0 refills | Status: DC

## 2017-09-14 ENCOUNTER — Telehealth: Payer: Self-pay | Admitting: Cardiology

## 2017-09-14 NOTE — Telephone Encounter (Signed)
New message    Please call  Melissa @ Dr Meda Coffee- [phone] wants to know if and how long to hold Warfarin. Please call     [facility] Pre-operative Risk Assessment    Request for surgical clearance:  1. What type of surgery is being performed? Eye injection  2. When is this surgery scheduled? 10/18  3. Are there any medications that need to be held prior to surgery and how long?warfarin (COUMADIN) 2 MG tablet  4. Practice name and name of physician performing surgery? Dr Meda Coffee  5. What is your office phone and fax number? Fax [phone] phone [phone]  6. Anesthesia type (None, local, MAC, general) ? none   Laurier Nancy 09/14/2017, 10:07 AM  _________________________________________________________________   (provider comments below)

## 2017-09-14 NOTE — Telephone Encounter (Signed)
Talked to [facility] at Dr Francesca Oman office as well

## 2017-09-14 NOTE — Telephone Encounter (Signed)
Patient on warfarin due to hx of DVT. Currently follow up at PCP office (DR Laurance Flatten).  Will forward clearance for Cherre Robins PharmD to provide anticoagulation clearance per protocol

## 2017-09-14 NOTE — Telephone Encounter (Signed)
Dr. Meda Coffee office is aware to call cardiologist

## 2017-09-15 NOTE — Telephone Encounter (Signed)
Please discuss with patient's cardiologist, Dr. Orlean Patten in and call ophthalmologist once this is done and patient

## 2017-09-18 NOTE — Telephone Encounter (Signed)
Since our office is managing her coumadin I don't know that Dr Percival Spanish will want to weigh in on adjusting it for her procedure. I will see if Memory Argue, PharmD can help with this on Friday.

## 2017-09-21 NOTE — Telephone Encounter (Signed)
This has been taken care. The patient should be ok to proceed without changes. She was notified previously as well as her provider.

## 2017-10-01 ENCOUNTER — Other Ambulatory Visit: Payer: Self-pay | Admitting: Physician Assistant

## 2017-10-01 DIAGNOSIS — I10 Essential (primary) hypertension: Secondary | ICD-10-CM

## 2017-10-02 NOTE — Telephone Encounter (Signed)
This is Dr. Hochrein's pt. °

## 2017-10-02 NOTE — Telephone Encounter (Signed)
Rx(s) sent to pharmacy electronically.  

## 2017-10-08 ENCOUNTER — Ambulatory Visit (INDEPENDENT_AMBULATORY_CARE_PROVIDER_SITE_OTHER): Payer: Medicare Other | Admitting: *Deleted

## 2017-10-08 DIAGNOSIS — Z7901 Long term (current) use of anticoagulants: Secondary | ICD-10-CM

## 2017-10-08 DIAGNOSIS — Z86718 Personal history of other venous thrombosis and embolism: Secondary | ICD-10-CM | POA: Diagnosis not present

## 2017-10-08 LAB — COAGUCHEK XS/INR WAIVED
INR: 2.5 — AB (ref 0.9–1.1)
Prothrombin Time: 30.3 s

## 2017-10-08 NOTE — Progress Notes (Signed)
Subjective:     Indication: DVT Bleeding signs/symptoms: None Thromboembolic signs/symptoms: None  Missed Coumadin doses: None Medication changes: no Dietary changes: no Bacterial/viral infection: no Other concerns: no  The following portions of the patient's history were reviewed and updated as appropriate: allergies and current medications.  Review of Systems Pertinent items are noted in HPI.   Objective:    INR Today: 2.5 Current dose: 4mg  on Thurs and 3mg  all other days  Assessment:    Therapeutic INR for goal of 2-3   Plan:    1. New dose: no change   2. Next INR: 6 weeks     Chong Sicilian, RN

## 2017-10-22 ENCOUNTER — Other Ambulatory Visit: Payer: Self-pay | Admitting: Family Medicine

## 2017-11-05 ENCOUNTER — Other Ambulatory Visit: Payer: Self-pay | Admitting: Family Medicine

## 2017-11-19 ENCOUNTER — Ambulatory Visit: Payer: Medicare Other | Admitting: *Deleted

## 2017-11-19 DIAGNOSIS — Z86718 Personal history of other venous thrombosis and embolism: Secondary | ICD-10-CM | POA: Diagnosis not present

## 2017-11-19 DIAGNOSIS — Z7901 Long term (current) use of anticoagulants: Secondary | ICD-10-CM

## 2017-11-19 LAB — COAGUCHEK XS/INR WAIVED
INR: 2.5 — AB (ref 0.9–1.1)
PROTHROMBIN TIME: 30.1 s

## 2017-11-19 NOTE — Progress Notes (Signed)
Subjective:     Indication: DVT Bleeding signs/symptoms: None Thromboembolic signs/symptoms: None  Missed Coumadin doses: None Medication changes: no Dietary changes: no Bacterial/viral infection: no Other concerns: no  The following portions of the patient's history were reviewed and updated as appropriate: allergies and current medications.  Review of Systems Pertinent items are noted in HPI.   Objective:    INR Today: 2.5 Current dose: 4 mg on Thurs and 3 mg all other days    Assessment:    Therapeutic INR for goal of 2-3   Plan:    1. New dose: no change   2. Next INR: 1 month   Repeat at next appointment with Dr Laurance Flatten on 12/31/16  Chong Sicilian, RN

## 2017-12-19 ENCOUNTER — Other Ambulatory Visit: Payer: Medicare Other

## 2017-12-19 DIAGNOSIS — I10 Essential (primary) hypertension: Secondary | ICD-10-CM

## 2017-12-19 DIAGNOSIS — E7800 Pure hypercholesterolemia, unspecified: Secondary | ICD-10-CM

## 2017-12-19 DIAGNOSIS — E559 Vitamin D deficiency, unspecified: Secondary | ICD-10-CM

## 2017-12-19 DIAGNOSIS — N184 Chronic kidney disease, stage 4 (severe): Secondary | ICD-10-CM

## 2017-12-19 DIAGNOSIS — Z7901 Long term (current) use of anticoagulants: Secondary | ICD-10-CM

## 2017-12-20 LAB — CBC WITH DIFFERENTIAL/PLATELET
BASOS ABS: 0 10*3/uL (ref 0.0–0.2)
Basos: 0 %
EOS (ABSOLUTE): 0.2 10*3/uL (ref 0.0–0.4)
Eos: 2 %
HEMOGLOBIN: 12.8 g/dL (ref 11.1–15.9)
Hematocrit: 38.9 % (ref 34.0–46.6)
Immature Grans (Abs): 0 10*3/uL (ref 0.0–0.1)
Immature Granulocytes: 0 %
Lymphocytes Absolute: 2.1 10*3/uL (ref 0.7–3.1)
Lymphs: 23 %
MCH: 31.1 pg (ref 26.6–33.0)
MCHC: 32.9 g/dL (ref 31.5–35.7)
MCV: 95 fL (ref 79–97)
MONOCYTES: 6 %
Monocytes Absolute: 0.6 10*3/uL (ref 0.1–0.9)
Neutrophils Absolute: 6.1 10*3/uL (ref 1.4–7.0)
Neutrophils: 69 %
Platelets: 294 10*3/uL (ref 150–379)
RBC: 4.11 x10E6/uL (ref 3.77–5.28)
RDW: 13.7 % (ref 12.3–15.4)
WBC: 8.9 10*3/uL (ref 3.4–10.8)

## 2017-12-20 LAB — LIPID PANEL
CHOL/HDL RATIO: 2.7 ratio (ref 0.0–4.4)
CHOLESTEROL TOTAL: 265 mg/dL — AB (ref 100–199)
HDL: 100 mg/dL (ref >39)
LDL CALC: 139 mg/dL — AB (ref 0–99)
Triglycerides: 132 mg/dL (ref 0–149)
VLDL Cholesterol Cal: 26 mg/dL (ref 5–40)

## 2017-12-20 LAB — HEPATIC FUNCTION PANEL
ALBUMIN: 4.6 g/dL (ref 3.5–4.7)
ALK PHOS: 133 IU/L — AB (ref 39–117)
ALT: 16 IU/L (ref 0–32)
AST: 18 IU/L (ref 0–40)
Bilirubin Total: 0.5 mg/dL (ref 0.0–1.2)
Bilirubin, Direct: 0.12 mg/dL (ref 0.00–0.40)
Total Protein: 6.8 g/dL (ref 6.0–8.5)

## 2017-12-20 LAB — BMP8+EGFR
BUN / CREAT RATIO: 19 (ref 12–28)
BUN: 34 mg/dL — AB (ref 8–27)
CHLORIDE: 107 mmol/L — AB (ref 96–106)
CO2: 22 mmol/L (ref 20–29)
Calcium: 10.3 mg/dL (ref 8.7–10.3)
Creatinine, Ser: 1.76 mg/dL — ABNORMAL HIGH (ref 0.57–1.00)
GFR calc non Af Amer: 25 mL/min/{1.73_m2} — ABNORMAL LOW (ref >59)
GFR, EST AFRICAN AMERICAN: 29 mL/min/{1.73_m2} — AB (ref >59)
Glucose: 83 mg/dL (ref 65–99)
POTASSIUM: 4.1 mmol/L (ref 3.5–5.2)
Sodium: 147 mmol/L — ABNORMAL HIGH (ref 134–144)

## 2017-12-20 LAB — VITAMIN D 25 HYDROXY (VIT D DEFICIENCY, FRACTURES): Vit D, 25-Hydroxy: 23.5 ng/mL — ABNORMAL LOW (ref 30.0–100.0)

## 2017-12-27 ENCOUNTER — Ambulatory Visit: Payer: Medicare Other | Admitting: Family Medicine

## 2017-12-27 ENCOUNTER — Encounter: Payer: Self-pay | Admitting: Family Medicine

## 2017-12-27 VITALS — BP 178/74 | HR 51 | Temp 96.8°F | Ht 62.0 in | Wt 136.6 lb

## 2017-12-27 DIAGNOSIS — Z789 Other specified health status: Secondary | ICD-10-CM

## 2017-12-27 DIAGNOSIS — I824Y3 Acute embolism and thrombosis of unspecified deep veins of proximal lower extremity, bilateral: Secondary | ICD-10-CM

## 2017-12-27 DIAGNOSIS — N183 Chronic kidney disease, stage 3 unspecified: Secondary | ICD-10-CM

## 2017-12-27 DIAGNOSIS — I82522 Chronic embolism and thrombosis of left iliac vein: Secondary | ICD-10-CM

## 2017-12-27 DIAGNOSIS — E559 Vitamin D deficiency, unspecified: Secondary | ICD-10-CM | POA: Diagnosis not present

## 2017-12-27 DIAGNOSIS — E7800 Pure hypercholesterolemia, unspecified: Secondary | ICD-10-CM | POA: Diagnosis not present

## 2017-12-27 DIAGNOSIS — I10 Essential (primary) hypertension: Secondary | ICD-10-CM | POA: Diagnosis not present

## 2017-12-27 DIAGNOSIS — M4004 Postural kyphosis, thoracic region: Secondary | ICD-10-CM | POA: Diagnosis not present

## 2017-12-27 DIAGNOSIS — I824Z9 Acute embolism and thrombosis of unspecified deep veins of unspecified distal lower extremity: Secondary | ICD-10-CM

## 2017-12-27 LAB — COAGUCHEK XS/INR WAIVED
INR: 2.9 — ABNORMAL HIGH (ref 0.9–1.1)
PROTHROMBIN TIME: 34.3 s

## 2017-12-27 MED ORDER — METOPROLOL SUCCINATE ER 25 MG PO TB24: 37.5000 mg | ORAL_TABLET | Freq: Every day | ORAL | 1 refills | Status: DC

## 2017-12-27 MED ORDER — AMLODIPINE BESYLATE 10 MG PO TABS: 10.0000 mg | ORAL_TABLET | Freq: Every day | ORAL | 1 refills | Status: DC

## 2017-12-27 NOTE — Patient Instructions (Signed)
Continue current medicines except take one half of the extended release metoprolol in the morning and a whole one at night Check blood pressure readings at home and bring blood pressures by for review in a couple of weeks We will call the nephrologist and try to confirm your appointment with them and let you know about that Continue to drink plenty of fluids Avoid NSAIDs Continue the Coumadin as currently doing except today only take 1-1/2 pills which would be 3 mg.  After today resume your previous schedule of 3 mg daily except for 4 mg on Thursday

## 2017-12-27 NOTE — Progress Notes (Signed)
Subjective:    Patient ID: Elizabeth Burch, female    DOB: 1926-11-14, 84 y.o.   MRN: [id number]  HPI Pt here for follow up of chronic medical problems which include hyperlipidemia, hypertension, CKD and DVT.  The patient is doing well overall.  She has no specific complaints today.  She has history of multiple medical problems including hyperlipidemia hypertension osteoporosis and chronic kidney disease.  She is on long-term anticoagulant therapy.  She is currently taking you [place] for gout and Coumadin for her thrombo-phlebitis.  She takes losartan and metoprolol for her heart.  She follows regularly with the nephrologist and the cardiologist.  Her INR today was 2.9 a little bit towards the thin side.  The patient denies any chest pain or shortness of breath.  She denies any trouble with swallowing heartburn indigestion nausea vomiting diarrhea.  She is passing her water without problems.  She is staying active in the house this winter and is actually driving some locally but staying off the road early in the morning and later in the evening.  She seems to be tolerating the adjustment without her husband well.  She has good family support.  She still has not seen the nephrologist that took the place of Elizabeth Burch and we will call and try to confirm who that is and when that will occur.     Review of Systems  Constitutional: Negative.   HENT: Negative.   Gastrointestinal: Negative.   Genitourinary: Negative.   Neurological: Negative.        Objective:   Physical Exam  Constitutional: She is oriented to person, place, and time. She appears well-developed and well-nourished. No distress.  The patient is elderly but looks much younger than her stated age of 86.  She is doing very well.  She is alert.  HENT:  Head: Normocephalic and atraumatic.  Right Ear: External ear normal.  Left Ear: External ear normal.  Nose: Nose normal.  Mouth/Throat: Oropharynx is clear and moist. No  oropharyngeal exudate.  Eyes: Conjunctivae and EOM are normal. Pupils are equal, round, and reactive to light. Right eye exhibits no discharge. Left eye exhibits no discharge. No scleral icterus.  Neck: Normal range of motion. Neck supple. No thyromegaly present.  No bruits thyromegaly or anterior cervical adenopathy  Cardiovascular: Normal rate, regular rhythm, normal heart sounds and intact distal pulses.  No murmur heard. Heart has a regular rate and rhythm at 60/min  Pulmonary/Chest: Effort normal and breath sounds normal. No respiratory distress. She has no wheezes. She has no rales.  Clear anteriorly and posteriorly  Abdominal: Soft. Bowel sounds are normal. She exhibits no mass. There is no tenderness. There is no rebound and no guarding.  The abdomen is soft without liver or spleen enlargement masses or bruits  Musculoskeletal: Normal range of motion. She exhibits no edema.  Lymphadenopathy:    She has no cervical adenopathy.  Neurological: She is alert and oriented to person, place, and time. She has normal reflexes. No cranial nerve deficit.  Skin: Skin is warm and dry. No rash noted.  Skin changes of both lower extremities secondary to chronic venous disease and DVT with hemosiderin stains in both legs with the right being worse than the left.  Psychiatric: She has a normal mood and affect. Her behavior is normal. Judgment and thought content normal.  Nursing note and vitals reviewed.  BP (!) 155/58 (BP Location: Left Arm, Patient Position: Sitting, Cuff Size: Normal)   Pulse (!) 51  Temp (!) 96.8 F (36 C) (Oral)   Ht 5\' 2"  (1.575 m)   Wt 136 lb 9.6 oz (62 kg)   BMI 24.98 kg/m         Assessment & Plan:  1. Acute venous embolism and thrombosis of deep vessels of distal lower extremity, unspecified laterality ([place]) -This is actually chronic venous embolism and thrombosis of both lower extremities. -The patient will take 3 mg of Coumadin today and after today she will  resume her regular therapy with 3 mg daily except for 4 mg on Thursday.  She will have her pro time repeated in 4 weeks. - CoaguChek XS/INR Waived  2. Essential hypertension -The blood pressure was elevated on 2 occasions today.  The second time it was 178/74 in the right arm sitting with a regular cuff.  As a result of this we will have her take a half of a metoprolol extended release in the morning and a whole one in the evening. -She will bring blood pressure readings by the office for review in a couple weeks and have the nurse to check the blood pressure here. - metoprolol succinate (TOPROL-XL) 25 MG 24 hr tablet; Take 1.5 tablets (37.5 mg total) by mouth daily.  Dispense: 135 tablet; Refill: 1 - amLODipine (NORVASC) 10 MG tablet; Take 1 tablet (10 mg total) by mouth daily.  Dispense: 90 tablet; Refill: 1  3. Chronic deep vein thrombosis (DVT) of iliac vein of left lower extremity (HCC) -Continue with Coumadin as directed above   4. Statin intolerance -Continue with aggressive therapeutic lifestyle changes  5. Pure hypercholesterolemia -Continue with aggressive therapeutic lifestyle changes  6. Vitamin D deficiency -No vitamin D replacement pending visit to the nephrologist  7. Postural kyphosis of thoracic region -Patient needs to be careful not put herself at risk for falling because of this kyphosis and osteoporosis.  8. Deep vein thrombosis (DVT) of proximal vein of both lower extremities, unspecified chronicity (HCC) -Continue with Coumadin as directed  9. Stage 3 chronic kidney disease ([place]) -Patient will have visit with Elizabeth Burch sometime in March for her nephrology follow-up since her previous nephrologist has retired.  Patient Instructions  Continue current medicines except take one half of the extended release metoprolol in the morning and a whole one at night Check blood pressure readings at home and bring blood pressures by for review in a couple of weeks We  will call the nephrologist and try to confirm your appointment with them and let you know about that Continue to drink plenty of fluids Avoid NSAIDs Continue the Coumadin as currently doing except today only take 1-1/2 pills which would be 3 mg.  After today resume your previous schedule of 3 mg daily except for 4 mg on Thursday  Arrie Senate MD

## 2017-12-28 ENCOUNTER — Other Ambulatory Visit: Payer: Self-pay

## 2017-12-28 DIAGNOSIS — I10 Essential (primary) hypertension: Secondary | ICD-10-CM

## 2017-12-28 MED ORDER — AMLODIPINE BESYLATE 10 MG PO TABS: 10.0000 mg | ORAL_TABLET | Freq: Every day | ORAL | 1 refills | Status: DC

## 2017-12-28 MED ORDER — METOPROLOL SUCCINATE ER 25 MG PO TB24: 37.5000 mg | ORAL_TABLET | Freq: Every day | ORAL | 1 refills | Status: DC

## 2018-01-16 ENCOUNTER — Other Ambulatory Visit: Payer: Self-pay | Admitting: Family Medicine

## 2018-01-25 ENCOUNTER — Ambulatory Visit: Payer: Medicare Other | Admitting: Family Medicine

## 2018-01-25 ENCOUNTER — Encounter: Payer: Self-pay | Admitting: Family Medicine

## 2018-01-25 VITALS — BP 163/57 | HR 52 | Temp 96.5°F | Ht 62.0 in | Wt 139.0 lb

## 2018-01-25 DIAGNOSIS — Z7901 Long term (current) use of anticoagulants: Secondary | ICD-10-CM | POA: Diagnosis not present

## 2018-01-25 DIAGNOSIS — Z86718 Personal history of other venous thrombosis and embolism: Secondary | ICD-10-CM

## 2018-01-25 DIAGNOSIS — I824Z9 Acute embolism and thrombosis of unspecified deep veins of unspecified distal lower extremity: Secondary | ICD-10-CM | POA: Diagnosis not present

## 2018-01-25 DIAGNOSIS — I10 Essential (primary) hypertension: Secondary | ICD-10-CM

## 2018-01-25 LAB — COAGUCHEK XS/INR WAIVED
INR: 2.9 — ABNORMAL HIGH (ref 0.9–1.1)
Prothrombin Time: 34.3 s

## 2018-01-25 NOTE — Progress Notes (Signed)
Subjective:    Patient ID: Elizabeth Burch, female    DOB: Nov 17, 1926, 84 y.o.   MRN: [id number]  HPI Patient here today for follow up on hypertension and protime.  The patient brings in blood pressures for review.  She had an INR today and the pro time was 2.9.  Last pro time a month ago was the same.  She is currently taking 1-1/2 tablets all days except for the Thursday and she takes 2 tablets or 4 mg.  Patient has been doing well with no particular complaints or problems.  Her INR was 2.9 and this is the same as it was 1 month ago.  She is 84 years old and she is taking 3 mg daily except for 4 mg on Thursday.  We will go ahead and reduce her to 3 mg every day and plan to recheck a pro time in 4 weeks.  She is planning to see her nephrologist sometime soon but has not gotten an appointment yet.  Her sister has already seen a nephrologist and was given a 1 year follow-up.  We were trying to arrange that visits could be scheduled on the same day for her sister and herself to make traveling arrangements easier for her family.  The patient looks great is doing well and seems to be adjusting to her life without her husband.  The last creatinine was 1.76 in January and this is fairly stable for this patient.  The GFR was 25.    Patient Active Problem List   Diagnosis Date Noted  . History of DVT of lower extremity 11/13/2016  . Chronic anticoagulation 11/13/2016  . Statin intolerance 03/19/2015  . Cardiomegaly 03/19/2015  . Osteoporosis 12/24/2013  . Kyphosis 11/03/2013  . Acute venous embolism and thrombosis of deep vessels of distal lower extremity ([place]) 08/26/2013  . 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 Fibrocystic disease of right breast 05/22/2013  . DVT of leg (deep venous thrombosis) ([place]) 02/16/2013  . Leg edema, left 09/16/2012  . Gout   . Stage 3 chronic kidney disease ([place]) 01/08/2012  . Multinodular thyroid 10/25/2011  . Fibrocystic breast disease, left.   .  Open-angle glaucoma   . Hyperlipidemia 01/06/2009  . Essential hypertension 01/06/2009   Outpatient Encounter Medications as of 01/25/2018  Medication Sig  . albuterol (PROVENTIL HFA;VENTOLIN HFA) 108 (90 Base) MCG/ACT inhaler Inhale 2 puffs into the lungs every 6 (six) hours as needed for wheezing or shortness of breath.  Marland Kitchen amLODipine (NORVASC) 10 MG tablet Take 1 tablet (10 mg total) by mouth daily.  . colchicine (COLCRYS) 0.6 MG tablet Take 1 tablet (0.6 mg total) by mouth 2 (two) times daily as needed.  . dorzolamide-timolol (COSOPT) 22.3-6.8 MG/ML ophthalmic solution Place 1 drop into both eyes 2 (two) times daily.   . furosemide (LASIX) 40 MG tablet Take 40 mg by mouth daily.   Marland Kitchen losartan (COZAAR) 100 MG tablet Take 1 tablet (100 mg total) by mouth daily.  . metoprolol succinate (TOPROL-XL) 25 MG 24 hr tablet Take 1.5 tablets (37.5 mg total) by mouth daily.  . Omega-3 Fatty Acids (FISH OIL) 1000 MG CAPS Take 1,000 mg by mouth daily.  . pantoprazole (PROTONIX) 40 MG tablet Take one 40 mg tablet twice weekly  . TRAVATAN Z 0.004 % SOLN ophthalmic solution Place 1 drop into both eyes daily.  Marland Kitchen ULORIC 40 MG tablet TAKE 1 TABLET BY MOUTH ONCE DAILY  . warfarin (COUMADIN) 2 MG tablet TAKE  1 & 1/2 (ONE & ONE-HALF) TABLETS BY MOUTH ONCE DAILY OR AS DIRECTED BY ANTICOAGULATION CLINIC   No facility-administered encounter medications on file as of 01/25/2018.       Review of Systems  Constitutional: Negative.   HENT: Negative.   Eyes: Negative.   Respiratory: Negative.   Cardiovascular: Negative.   Gastrointestinal: Negative.   Endocrine: Negative.   Genitourinary: Negative.   Musculoskeletal: Negative.   Skin: Negative.   Allergic/Immunologic: Negative.   Neurological: Negative.   Hematological: Negative.   Psychiatric/Behavioral: Negative.        Objective:   Physical Exam  Constitutional: She is oriented to person, place, and time. She appears well-developed and well-nourished.  No distress.  Patient is pleasant and looks great and is having no particular problems or complaints.  She is still waiting to hear from her appointment date with the nephrologist.  HENT:  Head: Normocephalic and atraumatic.  Eyes: Conjunctivae and EOM are normal. Pupils are equal, round, and reactive to light. Right eye exhibits no discharge. Left eye exhibits no discharge. No scleral icterus.  Neck: Normal range of motion.  Musculoskeletal: Normal range of motion. She exhibits no edema.  Lymphadenopathy:    She has no cervical adenopathy.  Neurological: She is alert and oriented to person, place, and time.  Skin: Skin is warm and dry.  Psychiatric: She has a normal mood and affect. Her behavior is normal. Judgment and thought content normal.  Nursing note and vitals reviewed.  BP (!) 163/57 (BP Location: Left Arm)   Pulse (!) 52   Temp (!) 96.5 F (35.8 C) (Oral)   Ht 5\' 2"  (1.575 m)   Wt 139 lb (63 kg)   BMI 25.42 kg/m   Patient brings in home blood pressures for review and all of these are good and we will scan a copy of this into her record and give her the original to take with her when she goes to see the nephrologist.      Assessment & Plan:  1. Acute venous embolism and thrombosis of deep vessels of distal lower extremity, unspecified laterality ([place]) -Continue with current anticoagulation therapy and this is now a chronic situation. - CoaguChek XS/INR Waived  2. History of DVT of lower extremity -10 you with Coumadin as directed  3. Chronic anticoagulation -Continue with Coumadin as directed  4. Essential hypertension -Continue to monitor blood pressures at home and bring readings to this office visit and to the visit when she goes to see the nephrologist.  Patient Instructions  Continue with Coumadin taking this 3 mg daily every day of the week Repeat pro time in 4 weeks Patient has 2 mg tablets and will take 1-1/2 tablets on a daily basis until her next pro  time check. She will call us and let us know who her which nephrologist she is going to be seeing even though she does not have the appointment yet.  Arrie Senate MD

## 2018-01-25 NOTE — Patient Instructions (Signed)
Continue with Coumadin taking this 3 mg daily every day of the week Repeat pro time in 4 weeks Patient has 2 mg tablets and will take 1-1/2 tablets on a daily basis until her next pro time check. She will call us and let us know who her which nephrologist she is going to be seeing even though she does not have the appointment yet.

## 2018-01-29 ENCOUNTER — Other Ambulatory Visit: Payer: Self-pay | Admitting: *Deleted

## 2018-01-29 MED ORDER — LOSARTAN POTASSIUM 100 MG PO TABS: 100.0000 mg | ORAL_TABLET | Freq: Every day | ORAL | 0 refills | Status: DC

## 2018-02-08 ENCOUNTER — Other Ambulatory Visit: Payer: Self-pay | Admitting: Family Medicine

## 2018-02-22 ENCOUNTER — Encounter: Payer: Medicare Other | Admitting: Pharmacist Clinician (PhC)/ Clinical Pharmacy Specialist

## 2018-03-01 ENCOUNTER — Ambulatory Visit: Payer: Medicare Other | Admitting: Pharmacist Clinician (PhC)/ Clinical Pharmacy Specialist

## 2018-03-01 DIAGNOSIS — Z7901 Long term (current) use of anticoagulants: Secondary | ICD-10-CM | POA: Diagnosis not present

## 2018-03-01 DIAGNOSIS — Z86718 Personal history of other venous thrombosis and embolism: Secondary | ICD-10-CM

## 2018-03-01 LAB — COAGUCHEK XS/INR WAIVED
INR: 3.7 — ABNORMAL HIGH (ref 0.9–1.1)
PROTHROMBIN TIME: 44.1 s

## 2018-03-01 NOTE — Patient Instructions (Addendum)
  Description   No warfarin today.  Take 1 and 1/2 (3mg ) all days.  Twice a week have a one of the following:  Cabbage, greens, broccoli, romaine lettuce, spinach, brussle sprouts  INR was 3.7

## 2018-03-06 ENCOUNTER — Other Ambulatory Visit: Payer: Self-pay | Admitting: Family Medicine

## 2018-03-06 DIAGNOSIS — Z1231 Encounter for screening mammogram for malignant neoplasm of breast: Secondary | ICD-10-CM

## 2018-03-08 ENCOUNTER — Ambulatory Visit: Payer: Medicare Other | Admitting: Pharmacist Clinician (PhC)/ Clinical Pharmacy Specialist

## 2018-03-08 DIAGNOSIS — Z7901 Long term (current) use of anticoagulants: Secondary | ICD-10-CM | POA: Diagnosis not present

## 2018-03-08 DIAGNOSIS — Z86718 Personal history of other venous thrombosis and embolism: Secondary | ICD-10-CM

## 2018-03-08 LAB — COAGUCHEK XS/INR WAIVED
INR: 1.7 — ABNORMAL HIGH (ref 0.9–1.1)
PROTHROMBIN TIME: 20.4 s

## 2018-03-08 NOTE — Patient Instructions (Signed)
Description   2 tablets today only then go back to taking 1 1/2 tablets every day  Twice a week have a one of the following:  Cabbage, greens, broccoli, romaine lettuce, spinach, brussle sprouts  INR was 1.7 Goal is between 2-3  A little thick today

## 2018-03-29 ENCOUNTER — Ambulatory Visit: Payer: Medicare Other | Admitting: Pharmacist Clinician (PhC)/ Clinical Pharmacy Specialist

## 2018-03-29 DIAGNOSIS — Z86718 Personal history of other venous thrombosis and embolism: Secondary | ICD-10-CM | POA: Diagnosis not present

## 2018-03-29 DIAGNOSIS — Z7901 Long term (current) use of anticoagulants: Secondary | ICD-10-CM | POA: Diagnosis not present

## 2018-03-29 LAB — COAGUCHEK XS/INR WAIVED
INR: 2 — ABNORMAL HIGH (ref 0.9–1.1)
PROTHROMBIN TIME: 23.4 s

## 2018-03-29 NOTE — Patient Instructions (Signed)
Description   Continue taking 1 1/2 tablets a day  Twice a week have a one of the following:  Cabbage, greens, broccoli, romaine lettuce, spinach, brussle sprouts  INR is 2.0 today (goal is 2-3)  Perfect reading today

## 2018-04-15 ENCOUNTER — Other Ambulatory Visit: Payer: Self-pay | Admitting: Family Medicine

## 2018-04-16 NOTE — Progress Notes (Signed)
Cardiology Office Note   Date:  04/17/2018   ID:  Elizabeth Burch, DOB 22-Jul-1926, MRN [id number]  PCP:  Chipper Herb, MD  Cardiologist:   Minus Breeding, MD  Referring:  Chipper Herb, MD  Chief Complaint  Patient presents with  . Hypertension      History of Present Illness: Elizabeth Burch is a 84 y.o. female who presents with a history of spontaneous DVT in the left leg in 10/13 with negative hypercoagulable workup. She has been treated with Coumadin.  Since I last saw her she has done well. She is doing her garden.  The patient denies any new symptoms such as chest discomfort, neck or arm discomfort. There has been no new shortness of breath, PND or orthopnea. There have been no reported palpitations, presyncope or syncope.    Past Medical History:  Diagnosis Date  . Arthritis    Gout  . Cataract   . Chronic kidney disease, stage IV (severe) ([place])   . Diverticulosis of colon (without mention of hemorrhage) 2008  . DVT (deep venous thrombosis) ([place]) 1950  . External hemorrhoids without mention of complication 9371  . Fibrocystic breast disease   . GERD (gastroesophageal reflux disease) 2004  . Glaucoma   . Gout   . Hiatal hernia 2004  . HTN (hypertension)   . Multiple thyroid nodules   . Palpitations   . Peripheral vascular disease [facility])     Past Surgical History:  Procedure Laterality Date  . APPENDECTOMY    . BREAST EXCISIONAL BIOPSY Bilateral 1975  . CESAREAN SECTION    . CYSTECTOMY     on colon  . TONSILLECTOMY       Current Outpatient Medications  Medication Sig Dispense Refill  . amLODipine (NORVASC) 10 MG tablet Take 1 tablet (10 mg total) by mouth daily. 90 tablet 1  . colchicine (COLCRYS) 0.6 MG tablet Take 1 tablet (0.6 mg total) by mouth 2 (two) times daily as needed. 60 tablet 0  . dorzolamide-timolol (COSOPT) 22.3-6.8 MG/ML ophthalmic solution Place 1 drop into both eyes 2 (two) times daily.     . furosemide (LASIX) 40 MG tablet  Take 40 mg by mouth daily.     Marland Kitchen losartan (COZAAR) 100 MG tablet Take 1 tablet (100 mg total) by mouth daily. 90 tablet 0  . metoprolol succinate (TOPROL-XL) 25 MG 24 hr tablet 25 mg. Take 1/2 tablet by mouth in the morning and one tablet in the evening    . Omega-3 Fatty Acids (FISH OIL) 1000 MG CAPS Take 1,000 mg by mouth daily.    Marland Kitchen ULORIC 40 MG tablet TAKE 1 TABLET BY MOUTH ONCE DAILY 90 tablet 3  . warfarin (COUMADIN) 2 MG tablet TAKE 1 & 1/2 (ONE & ONE-HALF) TABLETS BY MOUTH ONCE DAILY OR AS DIRECTED BY ANTICOAGULATION CLINIC 135 tablet 0  . TRAVATAN Z 0.004 % SOLN ophthalmic solution Place 1 drop into both eyes daily.  3   No current facility-administered medications for this visit.     Allergies:   Prednisone; Vigamox [moxifloxacin]; Allopurinol; Ace inhibitors; Alendronate sodium; Ciprofloxacin; Clarithromycin; Clindamycin/lincomycin; Nitrofurantoin; Ofloxacin; Penicillins; Sulfamethoxazole; and Sulfonamide derivatives    ROS:  Please see the history of present illness.   Otherwise, review of systems are positive for none.   All other systems are reviewed and negative.    PHYSICAL EXAM: VS:  BP (!) 160/62   Pulse (!) 53   Ht 5\' 2"  (1.575 m)   Wt  142 lb (64.4 kg)   BMI 25.97 kg/m  , BMI Body mass index is 25.97 kg/m.  GENERAL:  Well appearing NECK:  No jugular venous distention, waveform within normal limits, carotid upstroke brisk and symmetric, no bruits, no thyromegaly LUNGS:  Clear to auscultation bilaterally CHEST:  Unremarkable HEART:  PMI not displaced or sustained,S1 and S2 within normal limits, no S3, no S4, no clicks, no rubs, no murmurs ABD:  Flat, positive bowel sounds normal in frequency in pitch, no bruits, no rebound, no guarding, no midline pulsatile mass, no hepatomegaly, no splenomegaly EXT:  2 plus pulses throughout, mildedema, no cyanosis no clubbing    EKG:  EKG is  ordered today. The ekg ordered today demonstrates NSR, rate 53, PACs, no acute ST T  wave changes.     Recent Labs: 12/19/2017: ALT 16; BUN 34; Creatinine, Ser 1.76; Hemoglobin 12.8; Platelets 294; Potassium 4.1; Sodium 147    Lipid Panel    Component Value Date/Time   CHOL 265 (H) 12/19/2017 1105   CHOL 223 (H) 02/04/2014 0807   TRIG 132 12/19/2017 1105   TRIG 121 08/21/2016 0800   TRIG 82 02/04/2014 0807   HDL 100 12/19/2017 1105   HDL 93 08/21/2016 0800   HDL 84 02/04/2014 0807   CHOLHDL 2.7 12/19/2017 1105   LDLCALC 139 (H) 12/19/2017 1105   LDLCALC 121 (H) 06/23/2014 0806   LDLCALC 123 (H) 02/04/2014 0807      Wt Readings from Last 3 Encounters:  04/17/18 142 lb (64.4 kg)  01/25/18 139 lb (63 kg)  12/27/17 136 lb 9.6 oz (62 kg)      Other studies Reviewed: Additional studies/ records that were reviewed today include: None Review of the above records demonstrates:     ASSESSMENT AND PLAN:  HTN:  BP is elevated here but she assures me that her systolics are in the 672C at home and her diary and blood pressure are accurate.  No change in therapy.   HYPERLIPIDEMIA:  This is followed by Dr. Laurance Flatten.  After the last labs no change in therapy was suggested by [facility].    This is likely because her HDL was 100 and she has an excellent ratio.  CKD:  Creat was 1.76 which has been stable.   No change in therapy.  DVT:  She should continue to have lifetime anticoagulation with an unprovoked DVT.     No change in therapy.   EDEMA:    We had a discussion about this again.  This is controlled with compression stockings.  There is some chronic skin changes from venous stasis but there is no pain.  No change in therapy.    Current medicines are reviewed at length with the patient today.  The patient does not have concerns regarding medicines.  The following changes have been made:   None  Labs/ tests ordered today include: None  Orders Placed This Encounter  Procedures  . EKG 12-Lead     Disposition:   FU with me in one year.     Signed, Minus Breeding, MD  04/17/2018 10:51 AM    [facility]

## 2018-04-17 ENCOUNTER — Ambulatory Visit: Payer: Medicare Other | Admitting: Cardiology

## 2018-04-17 ENCOUNTER — Encounter: Payer: Self-pay | Admitting: Cardiology

## 2018-04-17 VITALS — BP 160/62 | HR 53 | Ht 62.0 in | Wt 142.0 lb

## 2018-04-17 DIAGNOSIS — M7989 Other specified soft tissue disorders: Secondary | ICD-10-CM

## 2018-04-17 DIAGNOSIS — I10 Essential (primary) hypertension: Secondary | ICD-10-CM | POA: Diagnosis not present

## 2018-04-17 NOTE — Patient Instructions (Signed)

## 2018-04-18 ENCOUNTER — Other Ambulatory Visit: Payer: Medicare Other

## 2018-04-18 DIAGNOSIS — I10 Essential (primary) hypertension: Secondary | ICD-10-CM

## 2018-04-18 DIAGNOSIS — E7800 Pure hypercholesterolemia, unspecified: Secondary | ICD-10-CM

## 2018-04-18 DIAGNOSIS — E559 Vitamin D deficiency, unspecified: Secondary | ICD-10-CM

## 2018-04-18 DIAGNOSIS — N184 Chronic kidney disease, stage 4 (severe): Secondary | ICD-10-CM

## 2018-04-19 ENCOUNTER — Ambulatory Visit
Admission: RE | Admit: 2018-04-19 | Discharge: 2018-04-19 | Disposition: A | Discharge status: Home / Self Care | Payer: Medicare Other | Source: Ambulatory Visit | Attending: Family Medicine | Admitting: Family Medicine

## 2018-04-19 DIAGNOSIS — Z1231 Encounter for screening mammogram for malignant neoplasm of breast: Secondary | ICD-10-CM

## 2018-04-19 LAB — LIPID PANEL
Chol/HDL Ratio: 2.4 ratio (ref 0.0–4.4)
Cholesterol, Total: 222 mg/dL — ABNORMAL HIGH (ref 100–199)
HDL: 94 mg/dL (ref >39)
LDL Calculated: 108 mg/dL — ABNORMAL HIGH (ref 0–99)
Triglycerides: 100 mg/dL (ref 0–149)
VLDL Cholesterol Cal: 20 mg/dL (ref 5–40)

## 2018-04-19 LAB — CBC WITH DIFFERENTIAL/PLATELET
Basophils Absolute: 0 10*3/uL (ref 0.0–0.2)
Basos: 0 %
EOS (ABSOLUTE): 0.2 10*3/uL (ref 0.0–0.4)
EOS: 3 %
HEMATOCRIT: 35.6 % (ref 34.0–46.6)
Hemoglobin: 11.7 g/dL (ref 11.1–15.9)
Immature Grans (Abs): 0 10*3/uL (ref 0.0–0.1)
Immature Granulocytes: 0 %
LYMPHS ABS: 1.5 10*3/uL (ref 0.7–3.1)
Lymphs: 19 %
MCH: 30.6 pg (ref 26.6–33.0)
MCHC: 32.9 g/dL (ref 31.5–35.7)
MCV: 93 fL (ref 79–97)
MONOS ABS: 0.5 10*3/uL (ref 0.1–0.9)
Monocytes: 6 %
Neutrophils Absolute: 5.7 10*3/uL (ref 1.4–7.0)
Neutrophils: 72 %
Platelets: 295 10*3/uL (ref 150–379)
RBC: 3.82 x10E6/uL (ref 3.77–5.28)
RDW: 14.3 % (ref 12.3–15.4)
WBC: 8 10*3/uL (ref 3.4–10.8)

## 2018-04-19 LAB — BMP8+EGFR
BUN / CREAT RATIO: 19 (ref 12–28)
BUN: 29 mg/dL — ABNORMAL HIGH (ref 8–27)
CO2: 21 mmol/L (ref 20–29)
Calcium: 10.5 mg/dL — ABNORMAL HIGH (ref 8.7–10.3)
Chloride: 109 mmol/L — ABNORMAL HIGH (ref 96–106)
Creatinine, Ser: 1.55 mg/dL — ABNORMAL HIGH (ref 0.57–1.00)
GFR, EST AFRICAN AMERICAN: 34 mL/min/{1.73_m2} — AB (ref >59)
GFR, EST NON AFRICAN AMERICAN: 29 mL/min/{1.73_m2} — AB (ref >59)
Glucose: 80 mg/dL (ref 65–99)
POTASSIUM: 4.7 mmol/L (ref 3.5–5.2)
SODIUM: 148 mmol/L — AB (ref 134–144)

## 2018-04-19 LAB — HEPATIC FUNCTION PANEL
ALBUMIN: 4.2 g/dL (ref 3.5–4.7)
ALT: 15 IU/L (ref 0–32)
AST: 21 IU/L (ref 0–40)
Alkaline Phosphatase: 106 IU/L (ref 39–117)
Bilirubin Total: 0.4 mg/dL (ref 0.0–1.2)
Bilirubin, Direct: 0.11 mg/dL (ref 0.00–0.40)
TOTAL PROTEIN: 6.4 g/dL (ref 6.0–8.5)

## 2018-04-19 LAB — VITAMIN D 25 HYDROXY (VIT D DEFICIENCY, FRACTURES): VIT D 25 HYDROXY: 20.6 ng/mL — AB (ref 30.0–100.0)

## 2018-04-24 ENCOUNTER — Encounter: Payer: Self-pay | Admitting: Family Medicine

## 2018-04-24 ENCOUNTER — Ambulatory Visit: Payer: Medicare Other | Admitting: Family Medicine

## 2018-04-24 VITALS — BP 170/72 | HR 54 | Temp 96.3°F | Ht 62.0 in | Wt 138.0 lb

## 2018-04-24 DIAGNOSIS — I10 Essential (primary) hypertension: Secondary | ICD-10-CM | POA: Diagnosis not present

## 2018-04-24 DIAGNOSIS — Z86718 Personal history of other venous thrombosis and embolism: Secondary | ICD-10-CM

## 2018-04-24 DIAGNOSIS — E559 Vitamin D deficiency, unspecified: Secondary | ICD-10-CM | POA: Diagnosis not present

## 2018-04-24 DIAGNOSIS — E7800 Pure hypercholesterolemia, unspecified: Secondary | ICD-10-CM

## 2018-04-24 DIAGNOSIS — I82522 Chronic embolism and thrombosis of left iliac vein: Secondary | ICD-10-CM | POA: Diagnosis not present

## 2018-04-24 DIAGNOSIS — N183 Chronic kidney disease, stage 3 unspecified: Secondary | ICD-10-CM

## 2018-04-24 DIAGNOSIS — Z789 Other specified health status: Secondary | ICD-10-CM | POA: Diagnosis not present

## 2018-04-24 DIAGNOSIS — N184 Chronic kidney disease, stage 4 (severe): Secondary | ICD-10-CM

## 2018-04-24 LAB — COAGUCHEK XS/INR WAIVED
INR: 2.2 — ABNORMAL HIGH (ref 0.9–1.1)
PROTHROMBIN TIME: 25.9 s

## 2018-04-24 NOTE — Patient Instructions (Signed)
Medicare Annual Wellness Visit  [place] and the medical providers at [facility] strive to bring you the best medical care.  In doing so we not only want to address your current medical conditions and concerns but also to detect new conditions early and prevent illness, disease and health-related problems.    Medicare offers a yearly Wellness Visit which allows our clinical staff to assess your need for preventative services including immunizations, lifestyle education, counseling to decrease risk of preventable diseases and screening for fall risk and other medical concerns.    This visit is provided free of charge (no copay) for all Medicare recipients. The clinical pharmacists at [facility] have begun to conduct these Wellness Visits which will also include a thorough review of all your medications.    As you primary medical provider recommend that you make an appointment for your Annual Wellness Visit if you have not done so already this year.  You may set up this appointment before you leave today or you may call back ([phone]) and schedule an appointment.  Please make sure when you call that you mention that you are scheduling your Annual Wellness Visit with the clinical pharmacist so that the appointment may be made for the proper length of time.     Continue current medications. Continue good therapeutic lifestyle changes which include good diet and exercise. Fall precautions discussed with patient. If an FOBT was given today- please return it to our front desk. If you are over 50 years old - you may need Prevnar 13 or the adult Pneumonia vaccine.  **Flu shots are available--- please call and schedule a FLU-CLINIC appointment**  After your visit with us today you will receive a survey in the mail or online from [employer] regarding your care with us. Please take a moment to fill this out. Your feedback is very  important to us as you can help us better understand your patient needs as well as improve your experience and satisfaction. WE CARE ABOUT YOU!!!    

## 2018-04-24 NOTE — Progress Notes (Addendum)
Subjective:    Patient ID: Elizabeth Burch, female    DOB: Jun 27, 1926, 84 y.o.   MRN: [id number]  HPI Pt here for follow up and management of chronic medical problems which includes hypertension and hyperlipidemia. She is taking medication regularly.  She is doing well overall.  She sees the cardiologist periodically and the nephrologist regularly.  She has stage IV chronic kidney disease.  She has been rushing today taking care of another relative and her blood pressure today is elevated.  She has no specific complaints.  She has had blood work done and this will be reviewed with her during the visit today.  Triglycerides were good at 100 the good cholesterol remains excellent at 94.  The LDL C cholesterol is elevated at 108.  The CBC has a normal white blood cell count a stable hemoglobin at 11.7 and an adequate platelet count.  The blood sugar was good at 80.  The most recent creatinine was actually improved from the past and is now 1.55.  The GFR is 29.  The sodium was slightly elevated at 148 the chloride was slightly elevated at 109 and the serum calcium was slightly elevated at 10.5.  The vitamin D was low at 20.6 and similar to past readings.  All of the liver function tests were normal.  The patient will continue to follow-up with the nephrologist that took Dr. Etheleen Nicks place.  She will see him again sometime around September or October.  The patient says she checks her blood pressures at home and they are running around 145/60.  She sees the ophthalmologist regularly.  She is been stressed today and her blood pressure was up on 2 different occasions the second time 170/72.  She denies any chest pain pressure tightness or shortness of breath.  She denies any trouble with swallowing heartburn indigestion nausea vomiting diarrhea blood in the stool black tarry bowel movements or change in bowel habits.  She is passing her water without problems and trying to drink plenty of water and  fluids.    Patient Active Problem List   Diagnosis Date Noted  . History of DVT of lower extremity 11/13/2016  . Chronic anticoagulation 11/13/2016  . Statin intolerance 03/19/2015  . Cardiomegaly 03/19/2015  . Osteoporosis 12/24/2013  . Kyphosis 11/03/2013  . Acute venous embolism and thrombosis of deep vessels of distal lower extremity ([place]) 08/26/2013  . 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 Fibrocystic disease of right breast 05/22/2013  . DVT of leg (deep venous thrombosis) ([place]) 02/16/2013  . Leg edema, left 09/16/2012  . Gout   . Stage 3 chronic kidney disease ([place]) 01/08/2012  . Multinodular thyroid 10/25/2011  . Fibrocystic breast disease, left.   . Open-angle glaucoma   . Hyperlipidemia 01/06/2009  . Essential hypertension 01/06/2009   Outpatient Encounter Medications as of 04/24/2018  Medication Sig  . amLODipine (NORVASC) 10 MG tablet Take 1 tablet (10 mg total) by mouth daily.  . colchicine (COLCRYS) 0.6 MG tablet Take 1 tablet (0.6 mg total) by mouth 2 (two) times daily as needed.  . dorzolamide-timolol (COSOPT) 22.3-6.8 MG/ML ophthalmic solution Place 1 drop into both eyes 2 (two) times daily.   . furosemide (LASIX) 40 MG tablet Take 40 mg by mouth daily.   Marland Kitchen losartan (COZAAR) 100 MG tablet Take 1 tablet (100 mg total) by mouth daily.  . metoprolol succinate (TOPROL-XL) 25 MG 24 hr tablet 25 mg. Take 1/2 tablet by mouth in the morning and  one tablet in the evening  . Omega-3 Fatty Acids (FISH OIL) 1000 MG CAPS Take 1,000 mg by mouth daily.  . TRAVATAN Z 0.004 % SOLN ophthalmic solution Place 1 drop into both eyes daily.  Marland Kitchen ULORIC 40 MG tablet TAKE 1 TABLET BY MOUTH ONCE DAILY  . warfarin (COUMADIN) 2 MG tablet TAKE 1 & 1/2 (ONE & ONE-HALF) TABLETS BY MOUTH ONCE DAILY OR AS DIRECTED BY ANTICOAGULATION CLINIC   No facility-administered encounter medications on file as of 04/24/2018.      Review of Systems  Constitutional: Negative.   HENT:  Negative.   Eyes: Negative.   Respiratory: Negative.   Cardiovascular: Negative.   Gastrointestinal: Negative.   Endocrine: Negative.   Genitourinary: Negative.   Musculoskeletal: Negative.   Skin: Negative.   Allergic/Immunologic: Negative.   Neurological: Negative.   Hematological: Negative.   Psychiatric/Behavioral: Negative.        Objective:   Physical Exam  Constitutional: She is oriented to person, place, and time. She appears well-developed and well-nourished. No distress.  The patient is pleasant and alert and doing incredibly well since the death of her husband.  HENT:  Head: Normocephalic and atraumatic.  Right Ear: External ear normal.  Left Ear: External ear normal.  Mouth/Throat: Oropharynx is clear and moist.  Nasal turbinate congestion bilaterally  Eyes: Pupils are equal, round, and reactive to light. Conjunctivae and EOM are normal. Right eye exhibits no discharge. Left eye exhibits no discharge. No scleral icterus.  She is followed by her ophthalmologist and eye doctor regularly  Neck: Normal range of motion. Neck supple. No thyromegaly present.  No bruits thyromegaly or anterior cervical adenopathy  Cardiovascular: Normal rate, regular rhythm, normal heart sounds and intact distal pulses.  No murmur heard. Heart has a regular rate and rhythm at 60/min  Pulmonary/Chest: Effort normal and breath sounds normal. No respiratory distress. She has no wheezes. She has no rales.  Clear anteriorly and posteriorly  Abdominal: Soft. Bowel sounds are normal. She exhibits no mass. There is no tenderness. There is no rebound and no guarding.  The abdomen is soft without masses tenderness organ enlargement bruits or inguinal adenopathy  Genitourinary:  Genitourinary Comments: Patient checks her breast regularly and gets her mammogram regularly and has had one done recently.  2 sisters have had breast cancer.  Musculoskeletal: Normal range of motion. She exhibits deformity.  She exhibits no edema or tenderness.  Patient has kyphosis.  Lymphadenopathy:    She has no cervical adenopathy.  Neurological: She is alert and oriented to person, place, and time. She has normal reflexes. No cranial nerve deficit.  Skin: Skin is warm and dry. No rash noted.  Skin is clear of any rash or infection or worrisome skin lesions on her back.  Psychiatric: She has a normal mood and affect. Her behavior is normal. Judgment and thought content normal.  The patient is pleasant with normal behavior and affect  Nursing note and vitals reviewed.   BP (!) 171/50 (BP Location: Left Arm)   Pulse (!) 54   Temp (!) 96.3 F (35.7 C) (Oral)   Ht 5\' 2"  (1.575 m)   Wt 138 lb (62.6 kg)   BMI 25.24 kg/m       Assessment & Plan:  1. Pure hypercholesterolemia -The patient is statin intolerant and will continue with her omega-3 fatty acids  2. Vitamin D deficiency -She will continue with vitamin D replacement  3. Essential hypertension -The blood pressure generally run much better  at home than here in the office and this is been documented in the past.  She has been stressed today with seeing a sister-in-law and trying to get back to the office to be seen on time.  The repeat blood pressure here in the office by me was 170/72.  Patient says that generally her blood pressures at home running around the 140s over the 60s.  4. History of DVT of lower extremity -The INR was 2.2 and she will stay on her current dose of Coumadin therapy and have the INR repeated in 5 weeks with the clinical pharmacist - CoaguChek XS/INR Waived  5. Stage 3 chronic kidney disease ([place]) -The patient is followed by the nephrologist every 6 months  6. Statin intolerance -She will continue with omega-3 fatty acids and as aggressive therapeutic lifestyle changes as possible  7. 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  8. Chronic kidney disease (CKD), stage IV (severe) ([place]) -With the most recent GFR I think she is now and category her stage IV chronic kidney disease.  Her actual creatinine was actually improved from previously however.  Patient Instructions                       Medicare Annual Wellness Visit  [facility] and the medical providers at [facility] strive to bring you the best medical care.  In doing so we not only want to address your current medical conditions and concerns but also to detect new conditions early and prevent illness, disease and health-related problems.    Medicare offers a yearly Wellness Visit which allows our clinical staff to assess your need for preventative services including immunizations, lifestyle education, counseling to decrease risk of preventable diseases and screening for fall risk and other medical concerns.    This visit is provided free of charge (no copay) for all Medicare recipients. The clinical pharmacists at [facility] have begun to conduct these Wellness Visits which will also include a thorough review of all your medications.    As you primary medical provider recommend that you make an appointment for your Annual Wellness Visit if you have not done so already this year.  You may set up this appointment before you leave today or you may call back ([phone]) and schedule an appointment.  Please make sure when you call that you mention that you are scheduling your Annual Wellness Visit with the clinical pharmacist so that the appointment may be made for the proper length of time.     Continue current medications. Continue good therapeutic lifestyle changes which include good diet and exercise. Fall precautions discussed with patient. If an FOBT was given today- please return it to our front desk. If you are over 6 years old - you may need Prevnar 22 or the adult Pneumonia vaccine.  **Flu shots are  available--- please call and schedule a FLU-CLINIC appointment**  After your visit with [place] today you will receive a survey in the mail or online from [employer] regarding your care with [place]. Please take a moment to fill this out. Your feedback is very important to [place] as you can help [place] better understand your patient needs as well as improve your experience and satisfaction. WE CARE ABOUT YOU!!!     Arrie Senate MD

## 2018-04-30 ENCOUNTER — Other Ambulatory Visit: Payer: Medicare Other

## 2018-04-30 DIAGNOSIS — Z1211 Encounter for screening for malignant neoplasm of colon: Secondary | ICD-10-CM

## 2018-05-02 LAB — FECAL OCCULT BLOOD, IMMUNOCHEMICAL: Fecal Occult Bld: NEGATIVE

## 2018-05-10 ENCOUNTER — Encounter: Payer: Self-pay | Admitting: Pharmacist Clinician (PhC)/ Clinical Pharmacy Specialist

## 2018-05-17 ENCOUNTER — Ambulatory Visit: Payer: Medicare Other | Admitting: Family Medicine

## 2018-05-17 ENCOUNTER — Encounter: Payer: Self-pay | Admitting: Family Medicine

## 2018-05-17 VITALS — BP 162/62 | HR 57 | Temp 97.1°F | Ht 62.0 in | Wt 138.0 lb

## 2018-05-17 DIAGNOSIS — R21 Rash and other nonspecific skin eruption: Secondary | ICD-10-CM | POA: Diagnosis not present

## 2018-05-17 MED ORDER — LORATADINE 10 MG PO TABS: 10.0000 mg | ORAL_TABLET | Freq: Every day | ORAL | 0 refills | Status: DC | PRN

## 2018-05-17 MED ORDER — TRIAMCINOLONE ACETONIDE 0.1 % EX CREA: 1.0000 "application " | TOPICAL_CREAM | Freq: Two times a day (BID) | CUTANEOUS | 0 refills | Status: DC

## 2018-05-17 NOTE — Patient Instructions (Signed)
The air in your stomach looks like an allergic reaction of some sort.  I am not sure what you came in contact with but we will treat your symptoms with Claritin (this is an antihistamine to help with itching) and topical triamcinolone to help reduce inflammation itching.  Use the cream twice a day to the affected areas on your stomach for the next 7 days then discontinue.  If your symptoms worsen or are persistent, come back to be evaluated.   Contact Dermatitis Dermatitis is redness, soreness, and swelling (inflammation) of the skin. Contact dermatitis is a reaction to certain substances that touch the skin. You either touched something that irritated your skin, or you have allergies to something you touched. Follow these instructions at home: [place] your skin as needed.  Apply cool compresses to the affected areas.  Try taking a bath with: ? Epsom salts. Follow the instructions on the package. You can get these at a pharmacy or grocery store. ? Baking soda. Pour a small amount into the bath as told by your doctor. ? Colloidal oatmeal. Follow the instructions on the package. You can get this at a pharmacy or grocery store.  Try applying baking soda paste to your skin. Stir water into baking soda until it looks like paste.  Do not scratch your skin.  Bathe less often.  Bathe in lukewarm water. Avoid using hot water. Medicines  Take or apply over-the-counter and prescription medicines only as told by your doctor.  If you were prescribed an antibiotic medicine, take or apply your antibiotic as told by your doctor. Do not stop taking the antibiotic even if your condition starts to get better. General instructions  Keep all follow-up visits as told by your doctor. This is important.  Avoid the substance that caused your reaction. If you do not know what caused it, keep a journal to try to track what caused it. Write down: ? What you eat. ? What cosmetic products you  use. ? What you drink. ? What you wear in the affected area. This includes jewelry.  If you were given a bandage (dressing), take care of it as told by your doctor. This includes when to change and remove it. Contact a doctor if:  You do not get better with treatment.  Your condition gets worse.  You have signs of infection such as: ? Swelling. ? Tenderness. ? Redness. ? Soreness. ? Warmth.  You have a fever.  You have new symptoms. Get help right away if:  You have a very bad headache.  You have neck pain.  Your neck is stiff.  You throw up (vomit).  You feel very sleepy.  You see red streaks coming from the affected area.  Your bone or joint underneath the affected area becomes painful after the skin has healed.  The affected area turns darker.  You have trouble breathing. This information is not intended to replace advice given to you by your health care provider. Make sure you discuss any questions you have with your health care provider. Document Released: 09/17/2009 Document Revised: 04/27/2016 Document Reviewed: 04/07/2015 Elsevier Interactive Patient Education  2018 [employer].

## 2018-05-17 NOTE — Progress Notes (Signed)
Subjective: CC: rash PCP: Chipper Herb, MD [id number] Elizabeth Burch is a 84 y.o. female presenting to clinic today for:  1. Rash Patient reports onset of rash on the right side of the abdomen yesterday.  She describes the rash as itchy.  Denies any contact with any new substances.  Denies any new detergents, lotions, foods, pets, medications.  She notes that last week she used [place] soap instead of Dove for a couple of days but she has been using her [employer] lately.  No recent illness including diarrhea, nausea, cough.  She has not had any fevers.  She applied an over-the-counter itch cream and Benadryl which did seem to help a little bit but not substantially.  She has not used anything today.   ROS: Per HPI  Allergies  Allergen Reactions  . Prednisone Hypertension  . Vigamox [Moxifloxacin] Nausea And Vomiting  . Allopurinol   . Ace Inhibitors Other (See Comments)    unknown  . Alendronate Sodium Rash  . Ciprofloxacin Rash  . Clarithromycin Rash  . Clindamycin/Lincomycin Rash  . Nitrofurantoin Rash  . Ofloxacin Rash  . Penicillins Rash  . Sulfamethoxazole Rash  . Sulfonamide Derivatives Rash   Past Medical History:  Diagnosis Date  . Arthritis    Gout  . Cataract   . Chronic kidney disease, stage IV (severe) ([place])   . Diverticulosis of colon (without mention of hemorrhage) 2008  . DVT (deep venous thrombosis) ([place]) 1950  . External hemorrhoids without mention of complication 0354  . Fibrocystic breast disease   . GERD (gastroesophageal reflux disease) 2004  . Glaucoma   . Gout   . Hiatal hernia 2004  . HTN (hypertension)   . Multiple thyroid nodules   . Palpitations   . Peripheral vascular disease ([place])     Current Outpatient Medications:  .  amLODipine (NORVASC) 10 MG tablet, Take 1 tablet (10 mg total) by mouth daily., Disp: 90 tablet, Rfl: 1 .  colchicine (COLCRYS) 0.6 MG tablet, Take 1 tablet (0.6 mg total) by mouth 2 (two) times daily as needed., Disp: 60  tablet, Rfl: 0 .  dorzolamide-timolol (COSOPT) 22.3-6.8 MG/ML ophthalmic solution, Place 1 drop into both eyes 2 (two) times daily. , Disp: , Rfl:  .  furosemide (LASIX) 40 MG tablet, Take 40 mg by mouth daily. , Disp: , Rfl:  .  losartan (COZAAR) 100 MG tablet, Take 1 tablet (100 mg total) by mouth daily., Disp: 90 tablet, Rfl: 0 .  metoprolol succinate (TOPROL-XL) 25 MG 24 hr tablet, 25 mg. Take 1/2 tablet by mouth in the morning and one tablet in the evening, Disp: , Rfl:  .  Omega-3 Fatty Acids (FISH OIL) 1000 MG CAPS, Take 1,000 mg by mouth daily., Disp: , Rfl:  .  TRAVATAN Z 0.004 % SOLN ophthalmic solution, Place 1 drop into both eyes daily., Disp: , Rfl: 3 .  ULORIC 40 MG tablet, TAKE 1 TABLET BY MOUTH ONCE DAILY, Disp: 90 tablet, Rfl: 3 .  warfarin (COUMADIN) 2 MG tablet, TAKE 1 & 1/2 (ONE & ONE-HALF) TABLETS BY MOUTH ONCE DAILY OR AS DIRECTED BY ANTICOAGULATION CLINIC, Disp: 135 tablet, Rfl: 0 Social History   Socioeconomic History  . Marital status: Widowed    Spouse name: Not on file  . Number of children: 1  . Years of education: Not on file  . Highest education level: Not on file  Occupational History  . Occupation: Retired     Fish farm manager: RETIRED  Social  Needs  . Financial resource strain: Not on file  . Food insecurity:    Worry: Not on file    Inability: Not on file  . Transportation needs:    Medical: Not on file    Non-medical: Not on file  Tobacco Use  . Smoking status: Never Smoker  . Smokeless tobacco: Never Used  Substance and Sexual Activity  . Alcohol use: No  . Drug use: No  . Sexual activity: Never  Lifestyle  . Physical activity:    Days per week: Not on file    Minutes per session: Not on file  . Stress: Not on file  Relationships  . Social connections:    Talks on phone: Not on file    Gets together: Not on file    Attends religious service: Not on file    Active member of club or organization: Not on file    Attends meetings of clubs or  organizations: Not on file    Relationship status: Not on file  . Intimate partner violence:    Fear of current or ex partner: Not on file    Emotionally abused: Not on file    Physically abused: Not on file    Forced sexual activity: Not on file  Other Topics Concern  . Not on file  Social History Narrative  . Not on file   Family History  Problem Relation Age of Onset  . Cancer Mother        pt Francene Boyers of the origin  . Breast cancer Sister   . Cancer Sister        breast  . Heart disease Brother   . Heart attack Brother   . Breast cancer Sister   . Cancer Sister        breast  . Breast cancer Sister   . Cancer Sister        breast  . Cirrhosis Brother   . Diabetes Brother     Objective: Office vital signs reviewed. BP (!) 162/62   Pulse (!) 57   Temp (!) 97.1 F (36.2 C) (Oral)   Ht 5\' 2"  (1.575 m)   Wt 138 lb (62.6 kg)   BMI 25.24 kg/m   Physical Examination:  General: Awake, alert, well appearing elderly female. No acute distress Skin: dry; intact.  There is a well-healed scar in the midline of her abdomen.  She has a blanching, erythematous maculopapular rash appreciated along the right side of her abdomen.  There are vesicles or pustules appreciated.  No exudate, bleeding.  No excoriation.  Assessment/ Plan: 84 y.o. female   1. Rash and nonspecific skin eruption Likely a contact or irritant dermatitis.  Discontinue Benadryl given risk of urinary retention and this elderly female.  WIll treat with oral Claritin p.o. daily and topical triamcinolone applied twice daily for 7 days.  Home care instructions were reviewed with the patient.  Handout was provided.  Reasons for return evaluation discussed.  Patient was good understanding and will follow-up as needed.  Meds ordered this encounter  Medications  . triamcinolone cream (KENALOG) 0.1 %    Sig: Apply 1 application topically 2 (two) times daily. x7 days    Dispense:  45 g    Refill:  0  . loratadine  (CLARITIN) 10 MG tablet    Sig: Take 1 tablet (10 mg total) by mouth daily as needed for itching.    Dispense:  15 tablet    Refill:  0  Janora Norlander, DO [facility] [phone]

## 2018-05-20 ENCOUNTER — Ambulatory Visit (INDEPENDENT_AMBULATORY_CARE_PROVIDER_SITE_OTHER): Payer: Medicare Other | Admitting: *Deleted

## 2018-05-20 VITALS — BP 159/58 | HR 58 | Temp 97.0°F | Ht 62.0 in | Wt 139.0 lb

## 2018-05-20 DIAGNOSIS — Z0000 Encounter for general adult medical examination without abnormal findings: Secondary | ICD-10-CM

## 2018-05-20 NOTE — Progress Notes (Addendum)
Subjective:   Elizabeth Burch is a 84 y.o. female who presents for Medicare Annual (Subsequent) preventive examination. She is retired from farming and running a service station for 22 years. She enjoys caring for others, working puzzles and shopping. For exercise, she walks and works in her garden. She states that her diet is semi-healthy and she gets in 3 meals a day. She is active in the church. She lives at home by herself, as she is widowed. She has one son, whom lives in [place], [place]. She does not have any pets, but  does have 2 flights of stairs in her home. Today, we discussed at length stair safety and fall precautions. She states that her health is about the same as it was a year ago.   Cardiac Risk Factors include: none;advanced age (>94men, >43 women)     Objective:     Vitals: BP (!) 159/58 (BP Location: Left Arm)   Pulse (!) 58   Temp (!) 97 F (36.1 C) (Oral)   Ht 5\' 2"  (1.575 m)   Wt 139 lb (63 kg)   BMI 25.42 kg/m   Body mass index is 25.42 kg/m.  Advanced Directives 05/20/2018 05/22/2017 10/27/2016 05/22/2016 04/19/2015 10/15/2014  Does Patient Have a Medical Advance Directive? No Yes No Yes Yes No  Type of Advance Directive - [place];Living will - [place];Living will [place] -  Does patient want to make changes to medical advance directive? - No - Patient declined - - No - Patient declined -  Copy of [place] in Chart? - Yes - No - copy requested No - copy requested -  Would patient like information on creating a medical advance directive? No - Patient declined - No - Patient declined - - No - patient declined information    Tobacco Social History   Tobacco Use  Smoking Status Never Smoker  Smokeless Tobacco Never Used     Counseling given: Not Answered   Past Medical History:  Diagnosis Date  . Arthritis    Gout  . Cataract   . Chronic kidney disease, stage IV (severe)  ([place])   . Diverticulosis of colon (without mention of hemorrhage) 2008  . DVT (deep venous thrombosis) ([place]) 1950  . External hemorrhoids without mention of complication 9983  . Fibrocystic breast disease   . GERD (gastroesophageal reflux disease) 2004  . Glaucoma   . Gout   . Hiatal hernia 2004  . HTN (hypertension)   . Palpitations   . Peripheral vascular disease [facility])    Past Surgical History:  Procedure Laterality Date  . APPENDECTOMY    . BREAST EXCISIONAL BIOPSY Bilateral 1975  . CESAREAN SECTION    . CYSTECTOMY     on colon  . EYE SURGERY Bilateral   . TONSILLECTOMY     Family History  Problem Relation Age of Onset  . Cancer Mother        stomach  . Cancer Sister        breast  . Heart disease Brother   . Heart attack Brother   . Cancer Sister        breast  . Cancer Sister        breast  . Cirrhosis Brother   . Alcohol abuse Brother   . Hyperlipidemia Brother   . Hypertension Brother   . Heart disease Brother   . Diabetes Brother   . Cancer Maternal Grandfather  Social History   Socioeconomic History  . Marital status: Widowed    Spouse name: Gene  . Number of children: 1  . Years of education: Not on file  . Highest education level: Not on file  Occupational History  . 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 Financial resource strain: Not on file  . Food insecurity:    Worry: Not on file    Inability: Not on file  . Transportation needs:    Medical: Not on file    Non-medical: Not on file  Tobacco Use  . Smoking status: Never Smoker  . Smokeless tobacco: Never Used  Substance and Sexual Activity  . Alcohol use: No  . Drug use: No  . Sexual activity: Never  Lifestyle  . Physical activity:    Days per week: Not on file    Minutes per session: Not on file  . Stress: Not on file  Relationships  . Social connections:    Talks on phone: Not on file    Gets together: Not on file     Attends religious service: Not on file    Active member of club or organization: Not on file    Attends meetings of clubs or organizations: Not on file    Relationship status: Not on file  Other Topics Concern  . Not on file  Social History Narrative  . Not on file    Outpatient Encounter Medications as of 05/20/2018  Medication Sig  . amLODipine (NORVASC) 10 MG tablet Take 1 tablet (10 mg total) by mouth daily.  . colchicine (COLCRYS) 0.6 MG tablet Take 1 tablet (0.6 mg total) by mouth 2 (two) times daily as needed.  . dorzolamide-timolol (COSOPT) 22.3-6.8 MG/ML ophthalmic solution Place 1 drop into both eyes 2 (two) times daily.   . furosemide (LASIX) 40 MG tablet Take 40 mg by mouth daily.   Marland Kitchen loratadine (CLARITIN) 10 MG tablet Take 1 tablet (10 mg total) by mouth daily as needed for itching.  . losartan (COZAAR) 100 MG tablet Take 1 tablet (100 mg total) by mouth daily.  . metoprolol succinate (TOPROL-XL) 25 MG 24 hr tablet 25 mg. Take 1/2 tablet by mouth in the morning and one tablet in the evening  . Omega-3 Fatty Acids (FISH OIL) 1000 MG CAPS Take 1,000 mg by mouth daily.  . TRAVATAN Z 0.004 % SOLN ophthalmic solution Place 1 drop into both eyes daily.  Marland Kitchen triamcinolone cream (KENALOG) 0.1 % Apply 1 application topically 2 (two) times daily. x7 days  . ULORIC 40 MG tablet TAKE 1 TABLET BY MOUTH ONCE DAILY  . warfarin (COUMADIN) 2 MG tablet TAKE 1 & 1/2 (ONE & ONE-HALF) TABLETS BY MOUTH ONCE DAILY OR AS DIRECTED BY ANTICOAGULATION CLINIC   No facility-administered encounter medications on file as of 05/20/2018.     Activities of Daily Living In your present state of health, do you have any difficulty performing the following activities: 05/20/2018 05/22/2017  Hearing? N N  Vision? N N  Difficulty concentrating or making decisions? N N  Walking or climbing stairs? N N  Dressing or bathing? N N  Doing errands, shopping? N N  Preparing Food and eating ? N N  Using the Toilet? N  N  In the past six months, have you accidently leaked urine? N N  Do you have problems with loss of bowel control? N N  Managing your  Medications? N N  Managing your Finances? N N  Housekeeping or managing your Housekeeping? N N  Some recent data might be hidden    Patient Care Team: Chipper Herb, MD as PCP - General (Family Medicine) Alphonsa Overall, MD (General Surgery) Fleet Contras, MD (Nephrology) Hennie Duos, MD (Rheumatology) Minus Breeding, MD as Consulting Physician (Cardiology) Wilford Corner, MD as Consulting Physician (Ophthalmology)    Assessment:   This is a routine wellness examination for [place].  Exercise Activities and Dietary recommendations Current Exercise Habits: Home exercise routine, Type of exercise: walking, Time (Minutes): 15, Frequency (Times/Week): 7, Weekly Exercise (Minutes/Week): 105, Intensity: Mild  Goals    . Prevent falls     Stay active        Fall Risk Fall Risk  11/24/2018 04/24/2018 01/25/2018 12/27/2017 08/27/2017  Falls in the past year? No No No No No  Number falls in past yr: - - - - -  Injury with Fall? - - - - -   Is the patient's home free of loose throw rugs in walkways, pet beds, electrical cords, etc?  Fall risks and hazards were discussed.  Depression Screen PHQ 2/9 Scores 05/20/2018 11/24/2018 04/24/2018 01/25/2018  PHQ - 2 Score 0 0 0 0     Cognitive Function MMSE - Mini Mental State Exam 05/20/2018 05/22/2017 05/22/2016 05/22/2016 04/19/2015  Orientation to time 5 4 5 5 5   Orientation to Place 5 5 5 5 5   Registration 3 3 3 3 3   Attention/ Calculation 5 5 5 5 5   Recall 3 3 3 3 3   Language- name 2 objects 2 2 2  - 2  Language- repeat 1 1 1  - 1  Language- follow 3 step command 3 3 3  - 3  Language- read & follow direction 1 1 1  - 1  Write a sentence 1 1 1  - 1  Copy design 1 1 1  - 1  Total score 30 29 30  - 30        Immunization History  Administered Date(s) Administered  . Influenza,inj,Quad PF,6+ Mos  10/23/2013    ualifies for Shingles Vaccine?declined  Screening Tests Health Maintenance  Topic Date Due  . INFLUENZA VACCINE  09/27/2018 (Originally 07/04/2018)  . PAP SMEAR  10/31/2019 (Originally 09/22/2016)  . PNA vac Low Risk Adult (1 of 2 - PCV13) 12/07/2020 (Originally 10/20/1993)  . DEXA SCAN  09/13/2018  . MAMMOGRAM  04/20/2019  . TETANUS/TDAP  07/04/2021    Cancer Screenings: Lung: Low Dose CT Chest recommended if Age 52-80 years, 30 pack-year currently smoking OR have quit w/in 15years. Patient does qualify. Breast:  Up to date on Mammogram? Yes   Up to date of Bone Density/Dexa? Yes Colorectal: done 04/2018.  Additional Screenings: declined Hepatitis C Screening:      Plan:   she will keep follow up with Dr Laurance Flatten and other specialist She will be careful not to fall and she will stay active. She is up to date on health maintenance.  Pt refused all vaccines. I have personally reviewed and noted the following in the patient's chart:   . Medical and social history . Use of alcohol, tobacco or illicit drugs  . Current medications and supplements . Functional ability and status . Nutritional status . Physical activity . Advanced directives . List of other physicians . Hospitalizations, surgeries, and ER visits in previous 12 months . Vitals . Screenings to include cognitive, depression, and falls . Referrals and appointments  In addition, I  have reviewed and discussed with patient certain preventive protocols, quality metrics, and best practice recommendations. A written personalized care plan for preventive services as well as general preventive health recommendations were provided to patient.     Mckade Gurka, Cameron Proud, LPN  5/83/0940  I have reviewed and agree with the above AWV documentation.   Mary-Margaret Hassell Done, FNP

## 2018-05-20 NOTE — Patient Instructions (Signed)
  Elizabeth Burch , Thank you for taking time to come for your Medicare Wellness Visit. I appreciate your ongoing commitment to your health goals. Please review the following plan we discussed and let me know if I can assist you in the future.   These are the goals we discussed: Goals    . Prevent falls     Stay active        This is a list of the screening recommended for you and due dates:  Health Maintenance  Topic Date Due  . Flu Shot  09/27/2018*  . Pap Smear  10/31/2019*  . Pneumonia vaccines (1 of 2 - PCV13) 12/07/2020*  . DEXA scan (bone density measurement)  09/13/2018  . Mammogram  04/20/2019  . Tetanus Vaccine  07/04/2021  *Topic was postponed. The date shown is not the original due date.    Keep follow up with DR Laurance Flatten and other specialist Stay active and prevent falls

## 2018-05-22 ENCOUNTER — Telehealth: Payer: Self-pay | Admitting: Family Medicine

## 2018-05-22 NOTE — Telephone Encounter (Signed)
Attempted to Call  pt - noted.

## 2018-05-31 ENCOUNTER — Ambulatory Visit: Payer: Medicare Other | Admitting: Pharmacist Clinician (PhC)/ Clinical Pharmacy Specialist

## 2018-05-31 DIAGNOSIS — Z86718 Personal history of other venous thrombosis and embolism: Secondary | ICD-10-CM

## 2018-05-31 DIAGNOSIS — Z7901 Long term (current) use of anticoagulants: Secondary | ICD-10-CM

## 2018-05-31 LAB — COAGUCHEK XS/INR WAIVED
INR: 2.1 — AB (ref 0.9–1.1)
PROTHROMBIN TIME: 25.5 s

## 2018-06-20 ENCOUNTER — Other Ambulatory Visit: Payer: Self-pay | Admitting: Family Medicine

## 2018-06-20 DIAGNOSIS — I10 Essential (primary) hypertension: Secondary | ICD-10-CM

## 2018-06-24 ENCOUNTER — Other Ambulatory Visit: Payer: Self-pay | Admitting: *Deleted

## 2018-06-24 NOTE — Telephone Encounter (Signed)
NA, refill for Metoprolol was denied. Sig on request was for take 1&1/2 tabs qd Per chart Dr. Percival Spanish changed prescription at visit with him on 04/17/18

## 2018-06-25 ENCOUNTER — Other Ambulatory Visit: Payer: Self-pay | Admitting: Family Medicine

## 2018-06-25 DIAGNOSIS — I10 Essential (primary) hypertension: Secondary | ICD-10-CM

## 2018-06-27 ENCOUNTER — Other Ambulatory Visit: Payer: Self-pay | Admitting: Family Medicine

## 2018-06-27 DIAGNOSIS — I10 Essential (primary) hypertension: Secondary | ICD-10-CM

## 2018-06-28 ENCOUNTER — Other Ambulatory Visit: Payer: Self-pay | Admitting: Family Medicine

## 2018-06-28 DIAGNOSIS — I10 Essential (primary) hypertension: Secondary | ICD-10-CM

## 2018-07-16 ENCOUNTER — Other Ambulatory Visit: Payer: Self-pay | Admitting: Family Medicine

## 2018-07-26 ENCOUNTER — Encounter: Payer: Self-pay | Admitting: Pharmacist Clinician (PhC)/ Clinical Pharmacy Specialist

## 2018-08-02 ENCOUNTER — Ambulatory Visit: Payer: Medicare Other | Admitting: Pharmacist Clinician (PhC)/ Clinical Pharmacy Specialist

## 2018-08-02 DIAGNOSIS — Z7901 Long term (current) use of anticoagulants: Secondary | ICD-10-CM | POA: Diagnosis not present

## 2018-08-02 DIAGNOSIS — Z86718 Personal history of other venous thrombosis and embolism: Secondary | ICD-10-CM | POA: Diagnosis not present

## 2018-08-02 LAB — COAGUCHEK XS/INR WAIVED
INR: 1.8 — AB (ref 0.9–1.1)
Prothrombin Time: 21.8 s

## 2018-08-02 NOTE — Patient Instructions (Signed)
Description   Take 2 tablets today, then continue taking 1 1/2 tablets a day  INR today is 1.8 ( goal 2-3) A little thick today

## 2018-08-07 ENCOUNTER — Other Ambulatory Visit: Payer: Self-pay | Admitting: Family Medicine

## 2018-08-21 ENCOUNTER — Other Ambulatory Visit: Payer: Medicare Other

## 2018-08-21 DIAGNOSIS — N183 Chronic kidney disease, stage 3 unspecified: Secondary | ICD-10-CM

## 2018-08-21 DIAGNOSIS — E559 Vitamin D deficiency, unspecified: Secondary | ICD-10-CM

## 2018-08-21 DIAGNOSIS — E7800 Pure hypercholesterolemia, unspecified: Secondary | ICD-10-CM

## 2018-08-21 DIAGNOSIS — I10 Essential (primary) hypertension: Secondary | ICD-10-CM

## 2018-08-22 LAB — CBC WITH DIFFERENTIAL/PLATELET
BASOS ABS: 0 10*3/uL (ref 0.0–0.2)
BASOS: 0 %
EOS (ABSOLUTE): 0.3 10*3/uL (ref 0.0–0.4)
Eos: 3 %
Hematocrit: 37.2 % (ref 34.0–46.6)
Hemoglobin: 11.8 g/dL (ref 11.1–15.9)
IMMATURE GRANS (ABS): 0 10*3/uL (ref 0.0–0.1)
Immature Granulocytes: 0 %
LYMPHS: 24 %
Lymphocytes Absolute: 1.9 10*3/uL (ref 0.7–3.1)
MCH: 29.6 pg (ref 26.6–33.0)
MCHC: 31.7 g/dL (ref 31.5–35.7)
MCV: 94 fL (ref 79–97)
MONOS ABS: 0.6 10*3/uL (ref 0.1–0.9)
Monocytes: 8 %
NEUTROS PCT: 65 %
Neutrophils Absolute: 5.3 10*3/uL (ref 1.4–7.0)
PLATELETS: 269 10*3/uL (ref 150–450)
RBC: 3.98 x10E6/uL (ref 3.77–5.28)
RDW: 14.5 % (ref 12.3–15.4)
WBC: 8.1 10*3/uL (ref 3.4–10.8)

## 2018-08-22 LAB — LIPID PANEL
CHOLESTEROL TOTAL: 213 mg/dL — AB (ref 100–199)
Chol/HDL Ratio: 2.8 ratio (ref 0.0–4.4)
HDL: 76 mg/dL (ref >39)
LDL Calculated: 110 mg/dL — ABNORMAL HIGH (ref 0–99)
Triglycerides: 133 mg/dL (ref 0–149)
VLDL CHOLESTEROL CAL: 27 mg/dL (ref 5–40)

## 2018-08-22 LAB — BMP8+EGFR
BUN/Creatinine Ratio: 23 (ref 12–28)
BUN: 33 mg/dL — ABNORMAL HIGH (ref 8–27)
CHLORIDE: 108 mmol/L — AB (ref 96–106)
CO2: 22 mmol/L (ref 20–29)
Calcium: 10.2 mg/dL (ref 8.7–10.3)
Creatinine, Ser: 1.44 mg/dL — ABNORMAL HIGH (ref 0.57–1.00)
GFR calc Af Amer: 37 mL/min/{1.73_m2} — ABNORMAL LOW (ref >59)
GFR calc non Af Amer: 32 mL/min/{1.73_m2} — ABNORMAL LOW (ref >59)
GLUCOSE: 83 mg/dL (ref 65–99)
POTASSIUM: 4.2 mmol/L (ref 3.5–5.2)
SODIUM: 146 mmol/L — AB (ref 134–144)

## 2018-08-22 LAB — HEPATIC FUNCTION PANEL
ALBUMIN: 4.4 g/dL (ref 3.5–4.7)
ALT: 15 IU/L (ref 0–32)
AST: 19 IU/L (ref 0–40)
Alkaline Phosphatase: 87 IU/L (ref 39–117)
BILIRUBIN TOTAL: 0.3 mg/dL (ref 0.0–1.2)
BILIRUBIN, DIRECT: 0.09 mg/dL (ref 0.00–0.40)
Total Protein: 6.2 g/dL (ref 6.0–8.5)

## 2018-08-22 LAB — VITAMIN D 25 HYDROXY (VIT D DEFICIENCY, FRACTURES): Vit D, 25-Hydroxy: 28 ng/mL — ABNORMAL LOW (ref 30.0–100.0)

## 2018-08-27 ENCOUNTER — Encounter: Payer: Self-pay | Admitting: Family Medicine

## 2018-08-27 ENCOUNTER — Ambulatory Visit: Payer: Medicare Other | Admitting: Family Medicine

## 2018-08-27 VITALS — BP 156/53 | HR 53 | Temp 97.0°F | Ht 62.0 in | Wt 137.0 lb

## 2018-08-27 DIAGNOSIS — E559 Vitamin D deficiency, unspecified: Secondary | ICD-10-CM | POA: Diagnosis not present

## 2018-08-27 DIAGNOSIS — N183 Chronic kidney disease, stage 3 unspecified: Secondary | ICD-10-CM

## 2018-08-27 DIAGNOSIS — E7800 Pure hypercholesterolemia, unspecified: Secondary | ICD-10-CM | POA: Diagnosis not present

## 2018-08-27 DIAGNOSIS — Z86718 Personal history of other venous thrombosis and embolism: Secondary | ICD-10-CM

## 2018-08-27 DIAGNOSIS — I10 Essential (primary) hypertension: Secondary | ICD-10-CM

## 2018-08-27 DIAGNOSIS — Z7901 Long term (current) use of anticoagulants: Secondary | ICD-10-CM

## 2018-08-27 LAB — COAGUCHEK XS/INR WAIVED
INR: 2.8 — AB (ref 0.9–1.1)
Prothrombin Time: 33.7 s

## 2018-08-27 NOTE — Patient Instructions (Addendum)
Medicare Annual Wellness Visit  [facility] and the medical providers at [facility] strive to bring you the best medical care.  In doing so we not only want to address your current medical conditions and concerns but also to detect new conditions early and prevent illness, disease and health-related problems.    Medicare offers a yearly Wellness Visit which allows our clinical staff to assess your need for preventative services including immunizations, lifestyle education, counseling to decrease risk of preventable diseases and screening for fall risk and other medical concerns.    This visit is provided free of charge (no copay) for all Medicare recipients. The clinical pharmacists at [facility] have begun to conduct these Wellness Visits which will also include a thorough review of all your medications.    As you primary medical provider recommend that you make an appointment for your Annual Wellness Visit if you have not done so already this year.  You may set up this appointment before you leave today or you may call back ([phone]) and schedule an appointment.  Please make sure when you call that you mention that you are scheduling your Annual Wellness Visit with the clinical pharmacist so that the appointment may be made for the proper length of time.     Continue current medications. Continue good therapeutic lifestyle changes which include good diet and exercise. Fall precautions discussed with patient. If an FOBT was given today- please return it to our front desk. If you are over 23 years old - you may need Prevnar 6 or the adult Pneumonia vaccine.  **Flu shots are available--- please call and schedule a FLU-CLINIC appointment**  After your visit with [place] today you will receive a survey in the mail or online from [employer] regarding your care with [place]. Please take a moment to fill this out. Your feedback is very  important to [place] as you can help [place] better understand your patient needs as well as improve your experience and satisfaction. WE CARE ABOUT YOU!!!   Description   continue taking 1 1/2 tablets a day  INR today is 2.8 ( goal 2-3) normal Follow up in 4 week      Stay Active and be careful not put yourself at risk for falling Continue to drink plenty of water and stay well-hydrated Check breast regularly and continue to get mammograms yearly Do not forget to get your flu shot starting in October Continue to follow-up with nephrology cardiology rheumatology and ophthalmology.

## 2018-08-27 NOTE — Progress Notes (Signed)
Subjective:    Patient ID: Elizabeth Burch, female    DOB: [birth year], 84 y.o.   MRN: [id number]  HPI Pt here for follow up and management of chronic medical problems which includes hyperlipidemia and hypertension. She is taking medication regularly.  The patient is doing well today and has no specific complaints.  Her INR is 2.8 and this is perfect and she will continue with her current Coumadin treatment regimen.  She has had blood work done and this will be reviewed with her visit today.  The CBC was good and stable with a white blood cell count not being elevated and the hemoglobin being good at 11.8 with adequate platelet count.  Her blood sugar is good at 83.  The creatinine, the most important kidney function test has actually improved over the past 4 months and is now 1.44.  The sodium was slightly elevated and the potassium was good.  Chloride was also slightly elevated.  The patient is statin intolerant and her LDL cholesterol continues to run about 110.  The good cholesterol is excellent however at 76 and triglycerides are good at 133.  The vitamin D level remains slightly decreased but is improved from previously and we will continue to monitor this.  All liver function tests are normal.  Patient has a strong family history of cancer in her family.  Especially breast cancer.  The patient sees cardiology and nephrology.  She is on amlodipine 10 furosemide 40 and Cozaar 100.  She also takes metoprolol 25.  The patient today denies any chest pain shortness of breath.  She denies any trouble with her stomach other than some occasional heartburn and she takes Pepto-Bismol for this.  Otherwise no change in bowel habits blood in the stool or black tarry bowel movements.  She is passing her water without problems.  She is currently having some problems with the pressure in her eyes and is seeing the ophthalmologist for this.    Patient Active Problem List   Diagnosis Date Noted  . History of DVT  of lower extremity 11/13/2016  . Chronic anticoagulation 11/13/2016  . Statin intolerance 03/19/2015  . Cardiomegaly 03/19/2015  . Osteoporosis 12/24/2013  . Kyphosis 11/03/2013  . Acute venous embolism and thrombosis of deep vessels of distal lower extremity ([place]) 08/26/2013  . 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 Fibrocystic disease of right breast 05/22/2013  . DVT of leg (deep venous thrombosis) ([place]) 02/16/2013  . Leg edema, left 09/16/2012  . Gout   . Stage 3 chronic kidney disease ([place]) 01/08/2012  . Multinodular thyroid 10/25/2011  . Fibrocystic breast disease, left.   . Open-angle glaucoma   . Hyperlipidemia 01/06/2009  . Essential hypertension 01/06/2009   Outpatient Encounter Medications as of 08/27/2018  Medication Sig  . amLODipine (NORVASC) 10 MG tablet TAKE 1 TABLET BY MOUTH ONCE DAILY  . furosemide (LASIX) 40 MG tablet Take 40 mg by mouth daily.   Marland Kitchen loratadine (CLARITIN) 10 MG tablet Take 1 tablet (10 mg total) by mouth daily as needed for itching.  . losartan (COZAAR) 100 MG tablet TAKE 1 TABLET BY MOUTH ONCE DAILY  . metoprolol succinate (TOPROL-XL) 25 MG 24 hr tablet TAKE 1 & 1/2 (ONE & ONE-HALF) TABLETS BY MOUTH ONCE DAILY  . Omega-3 Fatty Acids (FISH OIL) 1000 MG CAPS Take 1,000 mg by mouth daily.  Marland Kitchen ULORIC 40 MG tablet TAKE 1 TABLET BY MOUTH ONCE DAILY  . warfarin (COUMADIN) 2 MG tablet TAKE  1 & 1/2 (ONE & ONE-HALF) TABLETS BY MOUTH ONCE DAILY OR  AS  DIRECTED  BY  ANTICOAGULATION  CLINIC  . [DISCONTINUED] metoprolol succinate (TOPROL-XL) 25 MG 24 hr tablet 25 mg. Take 1/2 tablet by mouth in the morning and one tablet in the evening  . colchicine (COLCRYS) 0.6 MG tablet Take 1 tablet (0.6 mg total) by mouth 2 (two) times daily as needed. (Patient not taking: Reported on 08/27/2018)  . [DISCONTINUED] dorzolamide-timolol (COSOPT) 22.3-6.8 MG/ML ophthalmic solution Place 1 drop into both eyes 2 (two) times daily.   . [DISCONTINUED] TRAVATAN Z 0.004 %  SOLN ophthalmic solution Place 1 drop into both eyes daily.  . [DISCONTINUED] triamcinolone cream (KENALOG) 0.1 % Apply 1 application topically 2 (two) times daily. x7 days   No facility-administered encounter medications on file as of 08/27/2018.       Review of Systems  Constitutional: Negative.   HENT: Negative.   Eyes: Negative.   Respiratory: Negative.   Cardiovascular: Negative.   Gastrointestinal: Negative.   Endocrine: Negative.   Genitourinary: Negative.   Musculoskeletal: Negative.   Skin: Negative.   Allergic/Immunologic: Negative.   Neurological: Negative.   Hematological: Negative.   Psychiatric/Behavioral: Negative.        Objective:   Physical Exam  Constitutional: She is oriented to person, place, and time. She appears well-developed and well-nourished. No distress.  The patient is pleasant smiling and doing well, in fact extremely well considering she lost her husband over a year ago.  HENT:  Head: Normocephalic and atraumatic.  Right Ear: External ear normal.  Left Ear: External ear normal.  Nose: Nose normal.  Mouth/Throat: Oropharynx is clear and moist. No oropharyngeal exudate.  Continues to follow-up with ophthalmology because of increased pressure in eyes.  Eyes: Pupils are equal, round, and reactive to light. Conjunctivae and EOM are normal. Right eye exhibits no discharge. Left eye exhibits no discharge. No scleral icterus.  See previous note because of following the ophthalmologist for increased pressure in eyes.  Neck: Normal range of motion. Neck supple. No thyromegaly present.  No bruits thyromegaly or anterior cervical adenopathy  Cardiovascular: Normal rate, regular rhythm, normal heart sounds and intact distal pulses.  No murmur heard. The heart has a regular rate and rhythm at 60/min  Pulmonary/Chest: Effort normal and breath sounds normal. She has no wheezes. She has no rales. She exhibits no tenderness.  Clear anteriorly and posteriorly.   Patient continues to check her breasts regularly because of the family history and gets her mammograms yearly.  Abdominal: Soft. Bowel sounds are normal. She exhibits no mass. There is no tenderness.  No liver or spleen enlargement.  No epigastric tenderness.  No bruits.  No inguinal adenopathy.  Musculoskeletal: Normal range of motion. She exhibits deformity. She exhibits no edema.  Kyphosis  Lymphadenopathy:    She has no cervical adenopathy.  Neurological: She is alert and oriented to person, place, and time. She has normal reflexes. No cranial nerve deficit.  Skin: Skin is warm and dry. No rash noted.  Psychiatric: She has a normal mood and affect. Her behavior is normal. Judgment and thought content normal.  The patient's mood affect and behavior are unchanged from previous and normal for her.  Nursing note and vitals reviewed.  BP (!) 156/53 (BP Location: Left Arm)   Pulse (!) 53   Temp (!) 97 F (36.1 C) (Oral)   Ht 5\' 2"  (1.575 m)   Wt 137 lb (62.1 kg)   BMI  25.06 kg/m         Assessment & Plan:  1. Pure hypercholesterolemia -This patient is statin intolerant.  She will continue with her omega-3 fatty acids and with as aggressive therapeutic lifestyle changes as possible.  2. Essential hypertension -The patient's blood pressures at home of been running in the 130s and 140s over the 50s and 60s.  She recently saw the nephrologist.  No changes were made with her blood pressure control since she is doing so well and in fact her renal function seems to be improving somewhat.  3. Vitamin D deficiency -The vitamin D level is slightly improved with her improved renal function.  4. Stage 3 chronic kidney disease ([place]) Continue current blood pressure medicine with good blood pressure control and sodium restriction  5. History of DVT of lower extremity -Continue Coumadin as doing and recheck pro time in 4 weeks with clinical pharmacy - CoaguChek XS/INR Waived  6. Chronic  anticoagulation -Recheck pro time in 4 weeks.  Patient Instructions                        Medicare Annual Wellness Visit  [facility] and the medical providers at [facility] strive to bring you the best medical care.  In doing so we not only want to address your current medical conditions and concerns but also to detect new conditions early and prevent illness, disease and health-related problems.    Medicare offers a yearly Wellness Visit which allows our clinical staff to assess your need for preventative services including immunizations, lifestyle education, counseling to decrease risk of preventable diseases and screening for fall risk and other medical concerns.    This visit is provided free of charge (no copay) for all Medicare recipients. The clinical pharmacists at [facility] have begun to conduct these Wellness Visits which will also include a thorough review of all your medications.    As you primary medical provider recommend that you make an appointment for your Annual Wellness Visit if you have not done so already this year.  You may set up this appointment before you leave today or you may call back ([phone]) and schedule an appointment.  Please make sure when you call that you mention that you are scheduling your Annual Wellness Visit with the clinical pharmacist so that the appointment may be made for the proper length of time.     Continue current medications. Continue good therapeutic lifestyle changes which include good diet and exercise. Fall precautions discussed with patient. If an FOBT was given today- please return it to our front desk. If you are over 32 years old - you may need Prevnar 5 or the adult Pneumonia vaccine.  **Flu shots are available--- please call and schedule a FLU-CLINIC appointment**  After your visit with [place] today you will receive a survey in the mail or online from [employer] regarding your care  with [place]. Please take a moment to fill this out. Your feedback is very important to [place] as you can help [place] better understand your patient needs as well as improve your experience and satisfaction. WE CARE ABOUT YOU!!!   Description   continue taking 1 1/2 tablets a day  INR today is 2.8 ( goal 2-3) normal Follow up in 4 week      Stay Active and be careful not put yourself at risk for falling Continue to drink plenty of water and stay well-hydrated Check  breast regularly and continue to get mammograms yearly Do not forget to get your flu shot starting in October Continue to follow-up with nephrology cardiology rheumatology and ophthalmology.  Arrie Senate MD

## 2018-10-02 ENCOUNTER — Ambulatory Visit: Payer: Medicare Other | Admitting: Pharmacist Clinician (PhC)/ Clinical Pharmacy Specialist

## 2018-10-02 DIAGNOSIS — Z86718 Personal history of other venous thrombosis and embolism: Secondary | ICD-10-CM

## 2018-10-02 DIAGNOSIS — Z7901 Long term (current) use of anticoagulants: Secondary | ICD-10-CM | POA: Diagnosis not present

## 2018-10-02 LAB — COAGUCHEK XS/INR WAIVED
INR: 2.6 — ABNORMAL HIGH (ref 0.9–1.1)
Prothrombin Time: 31.6 s

## 2018-10-02 NOTE — Patient Instructions (Signed)
Description   Continue taking 1 1/2 tablets a day (3mg )  INR today is 2.6 ( goal 2-3) normal

## 2018-10-21 ENCOUNTER — Other Ambulatory Visit: Payer: Self-pay | Admitting: Family Medicine

## 2018-10-21 ENCOUNTER — Telehealth: Payer: Self-pay | Admitting: *Deleted

## 2018-10-21 MED ORDER — WARFARIN SODIUM 2 MG PO TABS: ORAL_TABLET | ORAL | 0 refills | Status: DC

## 2018-10-21 NOTE — Telephone Encounter (Signed)
Pt aware refill sent to [facility] 

## 2018-11-05 ENCOUNTER — Other Ambulatory Visit: Payer: Self-pay | Admitting: Family Medicine

## 2018-11-13 ENCOUNTER — Ambulatory Visit: Payer: Medicare Other | Admitting: Pharmacist Clinician (PhC)/ Clinical Pharmacy Specialist

## 2018-11-13 DIAGNOSIS — Z7901 Long term (current) use of anticoagulants: Secondary | ICD-10-CM

## 2018-11-13 DIAGNOSIS — Z86718 Personal history of other venous thrombosis and embolism: Secondary | ICD-10-CM

## 2018-11-13 LAB — COAGUCHEK XS/INR WAIVED
INR: 4.5 — ABNORMAL HIGH (ref 0.9–1.1)
Prothrombin Time: 53.9 s

## 2018-11-13 NOTE — Patient Instructions (Signed)
Description   No warfarin today and tomorrow, then continue taking 1 1/2 tablets a day (3mg )  INR today is 4.5 ( goal 2-3)  Too thin today

## 2018-11-20 ENCOUNTER — Ambulatory Visit: Payer: Medicare Other | Admitting: Pharmacist Clinician (PhC)/ Clinical Pharmacy Specialist

## 2018-11-20 DIAGNOSIS — Z7901 Long term (current) use of anticoagulants: Secondary | ICD-10-CM | POA: Diagnosis not present

## 2018-11-20 DIAGNOSIS — Z86718 Personal history of other venous thrombosis and embolism: Secondary | ICD-10-CM

## 2018-11-20 LAB — COAGUCHEK XS/INR WAIVED
INR: 2.2 — ABNORMAL HIGH (ref 0.9–1.1)
Prothrombin Time: 26.5 s

## 2018-11-20 NOTE — Patient Instructions (Signed)
Description   Take 1 1/2 tablets (3mg ) every day  INR today is 2.2 (goal is 2-3)  Perfect reading

## 2018-12-18 ENCOUNTER — Encounter: Payer: Self-pay | Admitting: Pharmacist Clinician (PhC)/ Clinical Pharmacy Specialist

## 2018-12-18 ENCOUNTER — Ambulatory Visit (INDEPENDENT_AMBULATORY_CARE_PROVIDER_SITE_OTHER): Payer: Medicare Other | Admitting: Pharmacist Clinician (PhC)/ Clinical Pharmacy Specialist

## 2018-12-18 ENCOUNTER — Ambulatory Visit: Payer: Self-pay | Admitting: Family Medicine

## 2018-12-18 ENCOUNTER — Ambulatory Visit: Payer: Medicare Other | Admitting: Family Medicine

## 2018-12-18 ENCOUNTER — Encounter: Payer: Self-pay | Admitting: Family Medicine

## 2018-12-18 VITALS — BP 158/56 | HR 56 | Temp 96.4°F | Ht 62.0 in | Wt 143.0 lb

## 2018-12-18 DIAGNOSIS — Z7901 Long term (current) use of anticoagulants: Secondary | ICD-10-CM | POA: Diagnosis not present

## 2018-12-18 DIAGNOSIS — R600 Localized edema: Secondary | ICD-10-CM | POA: Diagnosis not present

## 2018-12-18 DIAGNOSIS — Z86718 Personal history of other venous thrombosis and embolism: Secondary | ICD-10-CM | POA: Diagnosis not present

## 2018-12-18 DIAGNOSIS — R791 Abnormal coagulation profile: Secondary | ICD-10-CM | POA: Diagnosis not present

## 2018-12-18 LAB — HEMOGLOBIN, FINGERSTICK: Hemoglobin: 10 g/dL — ABNORMAL LOW (ref 11.1–15.9)

## 2018-12-18 LAB — COAGUCHEK XS/INR WAIVED
INR: 6.4 — AB (ref 0.9–1.1)
Prothrombin Time: 76.6 s

## 2018-12-18 NOTE — Patient Instructions (Signed)
As we discussed, I do not think that you have a clot in your lungs at this point, particularly given your high Coumadin level.  Your swelling is on both legs.  I want you to keep the legs elevated and wear compression hose.  Follow-up with me in the next 2 days so that we can recheck your INR and your swelling.  If anything gets worse, you develop shortness of breath I want you to seek immediate medical attention emergency department.  Edema  Edema is when you have too much fluid in your body or under your skin. Edema may make your legs, feet, and ankles swell up. Swelling is also common in looser tissues, like around your eyes. This is a common condition. It gets more common as you get older. There are many possible causes of edema. Eating too much salt (sodium) and being on your feet or sitting for a long time can cause edema in your legs, feet, and ankles. Hot weather may make edema worse. Edema is usually painless. Your skin may look swollen or shiny. Follow these instructions at home:  Keep the swollen body part raised (elevated) above the level of your heart when you are sitting or lying down.  Do not sit still or stand for a long time.  Do not wear tight clothes. Do not wear garters on your upper legs.  Exercise your legs. This can help the swelling go down.  Wear elastic bandages or support stockings as told by your doctor.  Eat a low-salt (low-sodium) diet to reduce fluid as told by your doctor.  Depending on the cause of your swelling, you may need to limit how much fluid you drink (fluid restriction).  Take over-the-counter and prescription medicines only as told by your doctor. Contact a doctor if:  Treatment is not working.  You have heart, liver, or kidney disease and have symptoms of edema.  You have sudden and unexplained weight gain. Get help right away if:  You have shortness of breath or chest pain.  You cannot breathe when you lie down.  You have pain, redness,  or warmth in the swollen areas.  You have heart, liver, or kidney disease and get edema all of a sudden.  You have a fever and your symptoms get worse all of a sudden. Summary  Edema is when you have too much fluid in your body or under your skin.  Edema may make your legs, feet, and ankles swell up. Swelling is also common in looser tissues, like around your eyes.  Raise (elevate) the swollen body part above the level of your heart when you are sitting or lying down.  Follow your doctor's instructions about diet and how much fluid you can drink (fluid restriction). This information is not intended to replace advice given to you by your health care provider. Make sure you discuss any questions you have with your health care provider. Document Released: 05/08/2008 Document Revised: 12/08/2016 Document Reviewed: 12/08/2016 Elsevier Interactive Patient Education  2019 [employer].

## 2018-12-18 NOTE — Patient Instructions (Addendum)
  Description   No warfarin today and tomorrow.  Eat high vitamin K foods today and tomorrow Take precautions against falls and call 911 if any bleeding occurs  INR today is 6.5 (goal is 2-3)  Level is too thin today

## 2018-12-18 NOTE — Progress Notes (Signed)
Subjective: CC: leg swelling PCP: Chipper Herb, MD HMC:[id number] Elizabeth Burch is a 85 y.o. female presenting to clinic today for:  1. Leg swelling Patient reports 1 week history of bilateral lower extremity edema with right greater than left.  She notes weeping of the right lower extremity.  She is here today for her recheck of INR and was noted to have a supratherapeutic INR.  She was given a dose of vitamin K here in office for INR of greater than 6.  She also noted to have a drop in hemoglobin though she denies any bleeding from her rectum, urethra or any other orifice.  She notes that she has been her usual state of health except for the lower extremity edema.  Denies any increased salt intake, change in activity or increased sedentary lifestyle.  Denies any orthopnea, shortness of breath or wheeze.  No new medications.   ROS: Per HPI  Allergies  Allergen Reactions  . Prednisone Hypertension  . Vigamox [Moxifloxacin] Nausea And Vomiting  . Allopurinol   . Ace Inhibitors Other (See Comments)    unknown  . Alendronate Sodium Rash  . Ciprofloxacin Rash  . Clarithromycin Rash  . Clindamycin/Lincomycin Rash  . Nitrofurantoin Rash  . Ofloxacin Rash  . Penicillins Rash  . Sulfamethoxazole Rash  . Sulfonamide Derivatives Rash   Past Medical History:  Diagnosis Date  . Arthritis    Gout  . Cataract   . Chronic kidney disease, stage IV (severe) ([place])   . Diverticulosis of colon (without mention of hemorrhage) 2008  . DVT (deep venous thrombosis) ([place]) 1950  . External hemorrhoids without mention of complication 6629  . Fibrocystic breast disease   . GERD (gastroesophageal reflux disease) 2004  . Glaucoma   . Gout   . Hiatal hernia 2004  . HTN (hypertension)   . Palpitations   . Peripheral vascular disease (HCC)     Current Outpatient Medications:  .  amLODipine (NORVASC) 10 MG tablet, TAKE 1 TABLET BY MOUTH ONCE DAILY, Disp: 90 tablet, Rfl: 1 .  colchicine (COLCRYS) 0.6 MG  tablet, Take 1 tablet (0.6 mg total) by mouth 2 (two) times daily as needed. (Patient not taking: Reported on 08/27/2018), Disp: 60 tablet, Rfl: 0 .  furosemide (LASIX) 40 MG tablet, Take 40 mg by mouth daily. , Disp: , Rfl:  .  loratadine (CLARITIN) 10 MG tablet, Take 1 tablet (10 mg total) by mouth daily as needed for itching., Disp: 15 tablet, Rfl: 0 .  losartan (COZAAR) 100 MG tablet, TAKE 1 TABLET BY MOUTH ONCE DAILY, Disp: 90 tablet, Rfl: 0 .  metoprolol succinate (TOPROL-XL) 25 MG 24 hr tablet, TAKE 1 & 1/2 (ONE & ONE-HALF) TABLETS BY MOUTH ONCE DAILY, Disp: 135 tablet, Rfl: 1 .  Omega-3 Fatty Acids (FISH OIL) 1000 MG CAPS, Take 1,000 mg by mouth daily., Disp: , Rfl:  .  ULORIC 40 MG tablet, TAKE 1 TABLET BY MOUTH ONCE DAILY, Disp: 90 tablet, Rfl: 3 .  warfarin (COUMADIN) 2 MG tablet, TAKE 1 & 1/2 (ONE & ONE-HALF) TABLETS BY MOUTH ONCE DAILY OR  AS  DIRECTED  BY  ANTICOAGULATION  CLINIC, Disp: 135 tablet, Rfl: 0 Social History   Socioeconomic History  . Marital status: Widowed    Spouse name: Gene  . Number of children: 1  . Years of education: Not on file  . Highest education level: Not on file  Occupational History  . 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 Financial resource strain: Not hard at all  . Food insecurity:    Worry: Never true    Inability: Never true  . Transportation needs:    Medical: No    Non-medical: No  Tobacco Use  . Smoking status: Never Smoker  . Smokeless tobacco: Never Used  Substance and Sexual Activity  . Alcohol use: No  . Drug use: No  . Sexual activity: Never  Lifestyle  . Physical activity:    Days per week: 0 days    Minutes per session: 0 min  . Stress: Only a little  Relationships  . Social connections:    Talks on phone: More than three times a week    Gets together: Three times a week    Attends religious service: More than 4 times per year    Active member of club or  organization: Yes    Attends meetings of clubs or organizations: 1 to 4 times per year    Relationship status: Widowed  . Intimate partner violence:    Fear of current or ex partner: No    Emotionally abused: No    Physically abused: No    Forced sexual activity: No  Other Topics Concern  . Not on file  Social History Narrative  . Not on file   Family History  Problem Relation Age of Onset  . Cancer Mother        stomach  . Cancer Sister        breast  . Heart disease Brother   . Heart attack Brother   . Cancer Sister        breast  . Cancer Sister        breast  . Cirrhosis Brother   . Alcohol abuse Brother   . Hyperlipidemia Brother   . Hypertension Brother   . Heart disease Brother   . Diabetes Brother   . Cancer Maternal Grandfather     Objective: Office vital signs reviewed. BP (!) 158/56   Pulse (!) 56   Temp (!) 96.4 F (35.8 C)   Ht '5\' 2"'$  (1.575 m)   Wt 143 lb (64.9 kg)   BMI 26.16 kg/m   Physical Examination:  General: Awake, alert, No acute distress HEENT: no conjunctival pallor noted. Cardio: slightly bradycardic rate w/ regular rhythm, S1S2 heard, no murmurs appreciated Pulm: clear to auscultation bilaterally, no wheezes, rhonchi or rales; normal work of breathing on room air Extremities: warm, well perfused, 2+ pitting edema bilaterally R>L. She has associated weeping of skin.  No cyanosis or clubbing; +1 pulses bilaterally.  No increased warmth.  She has some venous stasis changes of the skin.   Assessment/ Plan: 85 y.o. female   1. Bilateral lower extremity edema No recent weight for comparison.  Her weight in June 2019 was 138 pounds.  She is weighing 143 today.  This is a 5 pound increase over the last 7 months.  Unsure etiology.  I reviewed her echocardiogram from 2016 which demonstrated normal ejection fraction of 60 to 65%.  She has had a drop in hemoglobin presumably secondary to supratherapeutic INR though she does not admit any bleeding  from any orifice.  She has had history of blood clots in bilateral lower extremities in the past.  Given supratherapeutic INR, I would find it highly unlikely that she would have a blood clot in the lower extremities.  I will check her liver function, kidney function,  BNP, CBC and thyroid function to evaluate for metabolic causes.  Though I do suspect that lower extremity edema is multifactorial and may be exacerbated secondary to hemoglobin drop.  She will follow-up with me in 48 hours, sooner if needed.  We discussed reasons for emergent evaluation emergency department.  In the interim, I would like her to keep her legs elevated and use compression hose. - CMP14+EGFR - Brain natriuretic peptide - CBC - TSH  2. Supratherapeutic INR Given dose of vitamin K today.  She will follow-up in 48 hours for recheck of INR and lower extremities as above. - CMP14+EGFR   Orders Placed This Encounter  Procedures  . CMP14+EGFR  . Brain natriuretic peptide  . CBC  . TSH   No orders of the defined types were placed in this encounter.    Janora Norlander, DO [place] [phone]

## 2018-12-19 LAB — CMP14+EGFR
ALBUMIN: 4 g/dL (ref 3.2–4.6)
ALK PHOS: 124 IU/L — AB (ref 39–117)
ALT: 24 IU/L (ref 0–32)
AST: 21 IU/L (ref 0–40)
Albumin/Globulin Ratio: 1.6 (ref 1.2–2.2)
BUN/Creatinine Ratio: 27 (ref 12–28)
BUN: 47 mg/dL — AB (ref 10–36)
Bilirubin Total: <0.2 mg/dL (ref 0.0–1.2)
CO2: 17 mmol/L — AB (ref 20–29)
CREATININE: 1.74 mg/dL — AB (ref 0.57–1.00)
Calcium: 10 mg/dL (ref 8.7–10.3)
Chloride: 112 mmol/L — ABNORMAL HIGH (ref 96–106)
GFR calc Af Amer: 29 mL/min/{1.73_m2} — ABNORMAL LOW (ref >59)
GFR calc non Af Amer: 25 mL/min/{1.73_m2} — ABNORMAL LOW (ref >59)
GLOBULIN, TOTAL: 2.5 g/dL (ref 1.5–4.5)
Glucose: 118 mg/dL — ABNORMAL HIGH (ref 65–99)
Potassium: 4.9 mmol/L (ref 3.5–5.2)
SODIUM: 144 mmol/L (ref 134–144)
Total Protein: 6.5 g/dL (ref 6.0–8.5)

## 2018-12-19 LAB — CBC
HEMATOCRIT: 30.7 % — AB (ref 34.0–46.6)
Hemoglobin: 9.5 g/dL — ABNORMAL LOW (ref 11.1–15.9)
MCH: 28.8 pg (ref 26.6–33.0)
MCHC: 30.9 g/dL — ABNORMAL LOW (ref 31.5–35.7)
MCV: 93 fL (ref 79–97)
Platelets: 332 10*3/uL (ref 150–450)
RBC: 3.3 x10E6/uL — AB (ref 3.77–5.28)
RDW: 13.1 % (ref 11.7–15.4)
WBC: 10.6 10*3/uL (ref 3.4–10.8)

## 2018-12-19 LAB — TSH: TSH: 2.7 u[IU]/mL (ref 0.450–4.500)

## 2018-12-19 LAB — BRAIN NATRIURETIC PEPTIDE: BNP: 386.5 pg/mL — AB (ref 0.0–100.0)

## 2018-12-20 ENCOUNTER — Encounter: Payer: Self-pay | Admitting: Family Medicine

## 2018-12-20 ENCOUNTER — Ambulatory Visit: Payer: Medicare Other | Admitting: Family Medicine

## 2018-12-20 VITALS — BP 169/57 | HR 62 | Temp 97.1°F | Wt 141.0 lb

## 2018-12-20 DIAGNOSIS — R791 Abnormal coagulation profile: Secondary | ICD-10-CM | POA: Diagnosis not present

## 2018-12-20 DIAGNOSIS — D649 Anemia, unspecified: Secondary | ICD-10-CM | POA: Diagnosis not present

## 2018-12-20 DIAGNOSIS — M7989 Other specified soft tissue disorders: Secondary | ICD-10-CM | POA: Diagnosis not present

## 2018-12-20 LAB — COAGUCHEK XS/INR WAIVED
INR: 4.7 — ABNORMAL HIGH (ref 0.9–1.1)
Prothrombin Time: 55.9 s

## 2018-12-20 LAB — HEMOGLOBIN, FINGERSTICK: Hemoglobin: 9.8 g/dL — ABNORMAL LOW (ref 11.1–15.9)

## 2018-12-20 NOTE — Patient Instructions (Signed)
Your hemoglobin has remained stable.  This is good.  However, I do want you to remember to skip your Coumadin for the next several days.  Come in on Monday to have your INR checked again.  We will determine whether or not you can start your medicine back that day.  Remember to take your blood pressure medication.

## 2018-12-20 NOTE — Progress Notes (Signed)
Subjective: CC: Supratherapeutic INR, lower extremity edema PCP: Chipper Herb, MD YTK:[id number] Elizabeth Burch is a 85 y.o. female presenting to clinic today for:  1.  Supratherapeutic INR/chronic anticoagulation Patient noted to have a supratherapeutic INR 6.4 at last visit.  Her goal INR is 2-3.  She was given a dose of vitamin K and instructed to return in 48 hours for recheck.  Her hemoglobin had dropped from baseline of 11.8-10.0.  On CBC that was collected that day it was noted to be 9.5. She had denied any bleeding from any orifice. She is here for recheck today and notes that she forgot to hold the Coumadin as directed.  She denies any bleeding from any orifice.  No unusual fatigue or lightheadedness.  No shortness of breath.  2.  Lower extremity edema Thought to be secondary to venous stasis changes.  She has known CKD 4.  Labs were obtained which revealed an increase in her baseline creatinine to 1.74 from 1.44 in September.  BNP noted to be elevated at 386.5.  She notes that lower extremity edema has improved with leg elevation and compression hose.  She continues to have some fluid leakage through the legs.  Again denies any shortness of breath, orthopnea, change in activity tolerance.   ROS: Per HPI  Allergies  Allergen Reactions  . Prednisone Hypertension  . Vigamox [Moxifloxacin] Nausea And Vomiting  . Allopurinol   . Ace Inhibitors Other (See Comments)    unknown  . Alendronate Sodium Rash  . Ciprofloxacin Rash  . Clarithromycin Rash  . Clindamycin/Lincomycin Rash  . Nitrofurantoin Rash  . Ofloxacin Rash  . Penicillins Rash  . Sulfamethoxazole Rash  . Sulfonamide Derivatives Rash   Past Medical History:  Diagnosis Date  . Arthritis    Gout  . Cataract   . Chronic kidney disease, stage IV (severe) ([place])   . Diverticulosis of colon (without mention of hemorrhage) 2008  . DVT (deep venous thrombosis) ([place]) 1950  . External hemorrhoids without mention of complication  5573  . Fibrocystic breast disease   . GERD (gastroesophageal reflux disease) 2004  . Glaucoma   . Gout   . Hiatal hernia 2004  . HTN (hypertension)   . Palpitations   . Peripheral vascular disease (HCC)     Current Outpatient Medications:  .  amLODipine (NORVASC) 10 MG tablet, TAKE 1 TABLET BY MOUTH ONCE DAILY, Disp: 90 tablet, Rfl: 1 .  colchicine (COLCRYS) 0.6 MG tablet, Take 1 tablet (0.6 mg total) by mouth 2 (two) times daily as needed. (Patient not taking: Reported on 08/27/2018), Disp: 60 tablet, Rfl: 0 .  furosemide (LASIX) 40 MG tablet, Take 40 mg by mouth daily. , Disp: , Rfl:  .  loratadine (CLARITIN) 10 MG tablet, Take 1 tablet (10 mg total) by mouth daily as needed for itching., Disp: 15 tablet, Rfl: 0 .  losartan (COZAAR) 100 MG tablet, TAKE 1 TABLET BY MOUTH ONCE DAILY, Disp: 90 tablet, Rfl: 0 .  metoprolol succinate (TOPROL-XL) 25 MG 24 hr tablet, TAKE 1 & 1/2 (ONE & ONE-HALF) TABLETS BY MOUTH ONCE DAILY, Disp: 135 tablet, Rfl: 1 .  Omega-3 Fatty Acids (FISH OIL) 1000 MG CAPS, Take 1,000 mg by mouth daily., Disp: , Rfl:  .  ULORIC 40 MG tablet, TAKE 1 TABLET BY MOUTH ONCE DAILY, Disp: 90 tablet, Rfl: 3 .  warfarin (COUMADIN) 2 MG tablet, TAKE 1 & 1/2 (ONE & ONE-HALF) TABLETS BY MOUTH ONCE DAILY OR  AS  DIRECTED  BY  ANTICOAGULATION  CLINIC, Disp: 135 tablet, Rfl: 0 Social History   Socioeconomic History  . Marital status: Widowed    Spouse name: Gene  . Number of children: 1  . Years of education: Not on file  . Highest education level: Not on file  Occupational History  . 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 Financial resource strain: Not hard at all  . Food insecurity:    Worry: Never true    Inability: Never true  . Transportation needs:    Medical: No    Non-medical: No  Tobacco Use  . Smoking status: Never Smoker  . Smokeless tobacco: Never Used  Substance and Sexual Activity  . Alcohol use:  No  . Drug use: No  . Sexual activity: Never  Lifestyle  . Physical activity:    Days per week: 0 days    Minutes per session: 0 min  . Stress: Only a little  Relationships  . Social connections:    Talks on phone: More than three times a week    Gets together: Three times a week    Attends religious service: More than 4 times per year    Active member of club or organization: Yes    Attends meetings of clubs or organizations: 1 to 4 times per year    Relationship status: Widowed  . Intimate partner violence:    Fear of current or ex partner: No    Emotionally abused: No    Physically abused: No    Forced sexual activity: No  Other Topics Concern  . Not on file  Social History Narrative  . Not on file   Family History  Problem Relation Age of Onset  . Cancer Mother        stomach  . Cancer Sister        breast  . Heart disease Brother   . Heart attack Brother   . Cancer Sister        breast  . Cancer Sister        breast  . Cirrhosis Brother   . Alcohol abuse Brother   . Hyperlipidemia Brother   . Hypertension Brother   . Heart disease Brother   . Diabetes Brother   . Cancer Maternal Grandfather     Objective: Office vital signs reviewed. BP (!) 169/57   Pulse 62   Temp (!) 97.1 F (36.2 C)   Wt 141 lb (64 kg)   BMI 25.79 kg/m   Physical Examination:  General: Awake, alert, No acute distress Cardio: regular rate and rhythm, S1S2 heard, no murmurs appreciated Pulm: clear to auscultation bilaterally, no wheezes, rhonchi or rales; normal work of breathing on room air Extremities: warm, well perfused, 1+ pitting edema to mid shins.  Venous stasis changes noted  INR Goal: 2.0-3.0  Assessment/ Plan: 85 y.o. female   1. Supratherapeutic INR Continues to be supratherapeutic with INR at 4.7 today.  This is an improvement but she continues to be above goal of 2-3.  Her hemoglobin remained stable at 9.8 and she is essentially asymptomatic from anemia  standpoint.  We discussed again holding Coumadin and I would like her to hold it until she is reassessed on Monday.  She voiced good understanding.  She will follow-up as directed. - CoaguChek XS/INR Waived - Hemoglobin, fingerstick  2. Anemia, unspecified type As above - Hemoglobin, fingerstick  3. Leg swelling  Improving.  We did discuss that her BNP was elevated but she demonstrates no other signs or symptoms of fluid overload except for lower extremity edema.  Therefore, okay to continue with home dose of Lasix and frequency, compression hose and elevation of lower extremities.  I did provide her a prescription for compound triamcinolone to Cetaphil 1:3 ratio to apply to affected areas on lower extremities at bedtime for the next 2 weeks to help with venous stasis changes.  Her blood pressure is elevated during the visit today but I suspect this is because she has not taken her blood pressure medications today.  She will need to follow-up with PCP for this as well.   Orders Placed This Encounter  Procedures  . CoaguChek XS/INR Waived  . Hemoglobin, fingerstick   No orders of the defined types were placed in this encounter.    Janora Norlander, DO [place] [phone]

## 2018-12-23 ENCOUNTER — Other Ambulatory Visit: Payer: Self-pay | Admitting: Family Medicine

## 2018-12-23 ENCOUNTER — Encounter: Payer: Self-pay | Admitting: Family Medicine

## 2018-12-23 ENCOUNTER — Ambulatory Visit: Payer: Medicare Other | Admitting: Family Medicine

## 2018-12-23 VITALS — BP 142/50 | HR 54 | Temp 97.7°F | Ht 62.0 in | Wt 138.0 lb

## 2018-12-23 DIAGNOSIS — Z7901 Long term (current) use of anticoagulants: Secondary | ICD-10-CM

## 2018-12-23 DIAGNOSIS — I10 Essential (primary) hypertension: Secondary | ICD-10-CM

## 2018-12-23 DIAGNOSIS — Z86718 Personal history of other venous thrombosis and embolism: Secondary | ICD-10-CM

## 2018-12-23 LAB — COAGUCHEK XS/INR WAIVED
INR: 1.7 — AB (ref 0.9–1.1)
PROTHROMBIN TIME: 20.2 s

## 2018-12-23 NOTE — Progress Notes (Signed)
Subjective: CC: Follow-up supratherapeutic INR PCP: Chipper Herb, MD HDQ:[id number] Elizabeth Burch is a 85 y.o. female presenting to clinic today for:  1.  Chronic anticoagulation/supratherapeutic INR Patient was seen on Friday and noted to have an INR of 4.7.  She had forgotten to hold her Coumadin for previous INR of 6.4.  She had no active bleeding at that time and hemoglobin had remained stable.  She was instructed to hold her Coumadin over the weekend and return today for recheck INR.  She denies any problems over the weekend.  No bleeding, palpitations, shortness of breath.  She has persistent lower extremity edema bilaterally that is improving as well.  She has follow-up with her PCP next week.   ROS: Per HPI  Allergies  Allergen Reactions  . Prednisone Hypertension  . Vigamox [Moxifloxacin] Nausea And Vomiting  . Allopurinol   . Ace Inhibitors Other (See Comments)    unknown  . Alendronate Sodium Rash  . Ciprofloxacin Rash  . Clarithromycin Rash  . Clindamycin/Lincomycin Rash  . Nitrofurantoin Rash  . Ofloxacin Rash  . Penicillins Rash  . Sulfamethoxazole Rash  . Sulfonamide Derivatives Rash   Past Medical History:  Diagnosis Date  . Arthritis    Gout  . Cataract   . Chronic kidney disease, stage IV (severe) ([place])   . Diverticulosis of colon (without mention of hemorrhage) 2008  . DVT (deep venous thrombosis) ([place]) 1950  . External hemorrhoids without mention of complication 1194  . Fibrocystic breast disease   . GERD (gastroesophageal reflux disease) 2004  . Glaucoma   . Gout   . Hiatal hernia 2004  . HTN (hypertension)   . Palpitations   . Peripheral vascular disease (HCC)     Current Outpatient Medications:  .  amLODipine (NORVASC) 10 MG tablet, TAKE 1 TABLET BY MOUTH ONCE DAILY, Disp: 90 tablet, Rfl: 1 .  colchicine (COLCRYS) 0.6 MG tablet, Take 1 tablet (0.6 mg total) by mouth 2 (two) times daily as needed. (Patient not taking: Reported on 08/27/2018), Disp:  60 tablet, Rfl: 0 .  furosemide (LASIX) 40 MG tablet, Take 40 mg by mouth daily. , Disp: , Rfl:  .  loratadine (CLARITIN) 10 MG tablet, Take 1 tablet (10 mg total) by mouth daily as needed for itching., Disp: 15 tablet, Rfl: 0 .  losartan (COZAAR) 100 MG tablet, TAKE 1 TABLET BY MOUTH ONCE DAILY, Disp: 90 tablet, Rfl: 0 .  metoprolol succinate (TOPROL-XL) 25 MG 24 hr tablet, TAKE 1 & 1/2 (ONE & ONE-HALF) TABLETS BY MOUTH ONCE DAILY, Disp: 135 tablet, Rfl: 1 .  Omega-3 Fatty Acids (FISH OIL) 1000 MG CAPS, Take 1,000 mg by mouth daily., Disp: , Rfl:  .  ULORIC 40 MG tablet, TAKE 1 TABLET BY MOUTH ONCE DAILY, Disp: 90 tablet, Rfl: 3 .  warfarin (COUMADIN) 2 MG tablet, TAKE 1 & 1/2 (ONE & ONE-HALF) TABLETS BY MOUTH ONCE DAILY OR  AS  DIRECTED  BY  ANTICOAGULATION  CLINIC, Disp: 135 tablet, Rfl: 0 Social History   Socioeconomic History  . Marital status: Widowed    Spouse name: Gene  . Number of children: 1  . Years of education: Not on file  . Highest education level: Not on file  Occupational History  . 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 Financial resource strain: Not hard at all  . Food insecurity:    Worry: Never true  Inability: Never true  . Transportation needs:    Medical: No    Non-medical: No  Tobacco Use  . Smoking status: Never Smoker  . Smokeless tobacco: Never Used  Substance and Sexual Activity  . Alcohol use: No  . Drug use: No  . Sexual activity: Never  Lifestyle  . Physical activity:    Days per week: 0 days    Minutes per session: 0 min  . Stress: Only a little  Relationships  . Social connections:    Talks on phone: More than three times a week    Gets together: Three times a week    Attends religious service: More than 4 times per year    Active member of club or organization: Yes    Attends meetings of clubs or organizations: 1 to 4 times per year    Relationship status: Widowed  .  Intimate partner violence:    Fear of current or ex partner: No    Emotionally abused: No    Physically abused: No    Forced sexual activity: No  Other Topics Concern  . Not on file  Social History Narrative  . Not on file   Family History  Problem Relation Age of Onset  . Cancer Mother        stomach  . Cancer Sister        breast  . Heart disease Brother   . Heart attack Brother   . Cancer Sister        breast  . Cancer Sister        breast  . Cirrhosis Brother   . Alcohol abuse Brother   . Hyperlipidemia Brother   . Hypertension Brother   . Heart disease Brother   . Diabetes Brother   . Cancer Maternal Grandfather     Objective: Office vital signs reviewed. BP (!) 142/50   Pulse (!) 54   Temp 97.7 F (36.5 C) (Oral)   Ht 5\' 2"  (1.575 m)   Wt 138 lb (62.6 kg)   BMI 25.24 kg/m   Physical Examination:  General: Awake, alert, well nourished, No acute distress Cardio:bradycardic w/ regular rhythm, S1S2 heard, no murmurs appreciated Pulm: clear to auscultation bilaterally, no wheezes, rhonchi or rales; normal work of breathing on room air Extremities: warm, well perfused, 1+ pitting edema with weeping. NO cyanosis or clubbing  Assessment/ Plan: 85 y.o. female   1. Chronic anticoagulation INR now subtherapeutic at 1.7.  Okay to resume regular Coumadin dosing of 3 mg daily today with recheck next week.  She has an appointment with her PCP on Wednesday.  Would recommend rechecking hemoglobin level at that time as well to ensure that it is rising appropriately.  Her baseline hemoglobin level appears to be around 11.8.  Last check was a 10.0.  If persistently low, would have low threshold to refer for evaluation of blood loss anemia, particularly given chronic anticoagulation.  2. History of DVT of lower extremity As above.   No orders of the defined types were placed in this encounter.  No orders of the defined types were placed in this encounter.    Janora Norlander, DO [place] [phone]

## 2018-12-23 NOTE — Addendum Note (Signed)
Addended byCarrolyn Leigh on: 12/23/2018 10:19 AM   Modules accepted: Orders

## 2018-12-23 NOTE — Patient Instructions (Signed)
Ok to go back on your normal dosing of coumadin today and follow up with Sharyn Lull or Dr Laurance Flatten in 1 week for recheck.  Hopefully, you'll be back in normal range again.

## 2018-12-30 ENCOUNTER — Ambulatory Visit: Payer: Medicare Other | Admitting: Family Medicine

## 2019-01-01 ENCOUNTER — Encounter: Payer: Self-pay | Admitting: Family Medicine

## 2019-01-01 ENCOUNTER — Ambulatory Visit: Payer: Medicare Other | Admitting: Family Medicine

## 2019-01-01 VITALS — BP 154/55 | HR 53 | Temp 96.7°F | Ht 62.0 in | Wt 140.0 lb

## 2019-01-01 DIAGNOSIS — R600 Localized edema: Secondary | ICD-10-CM

## 2019-01-01 DIAGNOSIS — N183 Chronic kidney disease, stage 3 unspecified: Secondary | ICD-10-CM

## 2019-01-01 DIAGNOSIS — N184 Chronic kidney disease, stage 4 (severe): Secondary | ICD-10-CM

## 2019-01-01 DIAGNOSIS — E7800 Pure hypercholesterolemia, unspecified: Secondary | ICD-10-CM | POA: Diagnosis not present

## 2019-01-01 DIAGNOSIS — E559 Vitamin D deficiency, unspecified: Secondary | ICD-10-CM

## 2019-01-01 DIAGNOSIS — Z7901 Long term (current) use of anticoagulants: Secondary | ICD-10-CM

## 2019-01-01 DIAGNOSIS — M79661 Pain in right lower leg: Secondary | ICD-10-CM

## 2019-01-01 DIAGNOSIS — I10 Essential (primary) hypertension: Secondary | ICD-10-CM

## 2019-01-01 DIAGNOSIS — Z86718 Personal history of other venous thrombosis and embolism: Secondary | ICD-10-CM

## 2019-01-01 DIAGNOSIS — M7989 Other specified soft tissue disorders: Secondary | ICD-10-CM

## 2019-01-01 DIAGNOSIS — L03115 Cellulitis of right lower limb: Secondary | ICD-10-CM

## 2019-01-01 LAB — COAGUCHEK XS/INR WAIVED
INR: 5.4 — AB (ref 0.9–1.1)
PROTHROMBIN TIME: 65.3 s

## 2019-01-01 MED ORDER — FUROSEMIDE 40 MG PO TABS: 60.0000 mg | ORAL_TABLET | Freq: Every day | ORAL | 3 refills | Status: DC

## 2019-01-01 MED ORDER — CEPHALEXIN 500 MG PO CAPS: 500.0000 mg | ORAL_CAPSULE | Freq: Three times a day (TID) | ORAL | 0 refills | Status: DC

## 2019-01-01 NOTE — Progress Notes (Signed)
Subjective:    Patient ID: Elizabeth Burch, female    DOB: 1926-07-01, 85 y.o.   MRN: [id number]  HPI Pt here for follow up and management of chronic medical problems which includes hypertension and hyperlipidemia. She is taking medication regularly.  The patient continues to have redness and swelling of the right lower leg.  She is due to get a chest x-ray and we will wait for the chest x-ray and the DEXA scan at her next visit.  She will get lab work today.  She will get a pro time today.  The patient came to this office for bilateral lower extremity edema on January 15.  There was apparently weeping from the right lower extremity and she did have a supra therapeutic INR was given a dose of vitamin K here in the office.  She is also was noted to have a decrease in her hemoglobin but no history of any bleeding.  She has been in her usual state of health other than the lower extremity edema.  She did have a BNP which was elevated at 386.  2 years ago it was 621.  The creatinine was slightly more elevated than typically at 1.74 with electrolytes that were good other than a slightly elevated chloride.  The hemoglobin was low at 9.8 and previously about 4 months ago was 11.8.  The initial blood pressure reading today was 154/55.  The weight today is 140.  The most recent weight was 143.  Apparently a baseline weight is about 138.  Patient comes with her son to the visit today.  We discussed all of the previous lab results.  Unfortunately, the patient is allergic to multiple antibiotics including clarithromycin and Cipro Macrodantin penicillins sulfa.  I am not sure if she can take Keflex or not but will need to put her on antibiotic today.  I do not see doxycycline listed.  The patient today denies any chest pain pressure tightness or shortness of breath.  She denies any trouble with swallowing heartburn indigestion nausea vomiting diarrhea blood in the stool or black tarry bowel movements.  We will check  another pro time today get a CBC BMP and add an antibiotic to her current treatment regimen along with continued elevation and saline soaks to the leg     Patient Active Problem List   Diagnosis Date Noted  . History of DVT of lower extremity 11/13/2016  . Chronic anticoagulation 11/13/2016  . Statin intolerance 03/19/2015  . Cardiomegaly 03/19/2015  . Osteoporosis 12/24/2013  . Kyphosis 11/03/2013  . Acute venous embolism and thrombosis of deep vessels of distal lower extremity ([place]) 08/26/2013  . 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 Fibrocystic disease of right breast 05/22/2013  . DVT of leg (deep venous thrombosis) ([place]) 02/16/2013  . Leg edema, left 09/16/2012  . Gout   . Stage 3 chronic kidney disease ([place]) 01/08/2012  . Multinodular thyroid 10/25/2011  . Fibrocystic breast disease, left.   . Open-angle glaucoma   . Hyperlipidemia 01/06/2009  . Essential hypertension 01/06/2009   Outpatient Encounter Medications as of 01/01/2019  Medication Sig  . amLODipine (NORVASC) 10 MG tablet TAKE 1 TABLET BY MOUTH ONCE DAILY  . furosemide (LASIX) 40 MG tablet Take 40 mg by mouth daily.   Marland Kitchen loratadine (CLARITIN) 10 MG tablet Take 1 tablet (10 mg total) by mouth daily as needed for itching.  . losartan (COZAAR) 100 MG tablet TAKE 1 TABLET BY MOUTH ONCE DAILY  .  metoprolol succinate (TOPROL-XL) 25 MG 24 hr tablet TAKE 1 & 1/2 (ONE & ONE-HALF) TABLETS BY MOUTH ONCE DAILY  . Omega-3 Fatty Acids (FISH OIL) 1000 MG CAPS Take 1,000 mg by mouth daily.  Marland Kitchen ULORIC 40 MG tablet TAKE 1 TABLET BY MOUTH ONCE DAILY  . warfarin (COUMADIN) 2 MG tablet TAKE 1 & 1/2 (ONE & ONE-HALF) TABLETS BY MOUTH ONCE DAILY OR  AS  DIRECTED  BY  ANTICOAGULATION  CLINIC  . colchicine (COLCRYS) 0.6 MG tablet Take 1 tablet (0.6 mg total) by mouth 2 (two) times daily as needed. (Patient not taking: Reported on 08/27/2018)   No facility-administered encounter medications on file as of 01/01/2019.       Review of Systems  Constitutional: Negative.   HENT: Negative.   Eyes: Negative.   Respiratory: Negative.   Cardiovascular: Negative.   Gastrointestinal: Negative.   Endocrine: Negative.   Genitourinary: Negative.   Musculoskeletal: Negative.   Skin: Positive for color change (right lower leg red, heat and swelling - some drainage).  Allergic/Immunologic: Negative.   Neurological: Negative.   Hematological: Negative.   Psychiatric/Behavioral: Negative.        Objective:   Physical Exam Vitals signs and nursing note reviewed.  Constitutional:      Appearance: Normal appearance. She is well-developed and normal weight. She is not ill-appearing.  HENT:     Head: Atraumatic.     Right Ear: External ear normal.     Left Ear: External ear normal.     Nose: Nose normal. No congestion.  Eyes:     General: No scleral icterus.       Right eye: No discharge.        Left eye: No discharge.     Extraocular Movements: Extraocular movements intact.     Conjunctiva/sclera: Conjunctivae normal.     Pupils: Pupils are equal, round, and reactive to light.  Neck:     Musculoskeletal: Normal range of motion and neck supple.     Thyroid: No thyromegaly.     Vascular: No carotid bruit or JVD.  Cardiovascular:     Rate and Rhythm: Normal rate and regular rhythm.     Pulses: Normal pulses.     Heart sounds: Normal heart sounds. No murmur.     Comments: The heart is regular at 60/min, pulses are normal in the right foot. Pulmonary:     Effort: Pulmonary effort is normal. No respiratory distress.     Breath sounds: Normal breath sounds. No wheezing or rales.     Comments: Clear anteriorly and posteriorly Abdominal:     General: Abdomen is flat. Bowel sounds are normal.     Palpations: Abdomen is soft. There is no mass.     Tenderness: There is no abdominal tenderness.  Musculoskeletal: Normal range of motion.        General: Swelling present. No tenderness.     Right lower leg:  Edema present.  Lymphadenopathy:     Cervical: No cervical adenopathy.  Skin:    General: Skin is warm and dry.     Findings: Erythema and rash present.  Neurological:     Mental Status: She is alert and oriented to person, place, and time. Mental status is at baseline.     Cranial Nerves: No cranial nerve deficit.     Deep Tendon Reflexes: Reflexes are normal and symmetric. Reflexes normal.  Psychiatric:        Mood and Affect: Mood normal.  Behavior: Behavior normal.        Thought Content: Thought content normal.        Judgment: Judgment normal.    BP (!) 154/55 (BP Location: Left Arm)   Pulse (!) 53   Temp (!) 96.7 F (35.9 C) (Oral)   Ht '5\' 2"'$  (1.575 m)   Wt 140 lb (63.5 kg)   BMI 25.61 kg/m        Assessment & Plan:  1. Essential hypertension -The blood pressure is elevated today but the patient obviously is under a lot of stress and will not make any changes with her blood pressure medicine although we will will be increasing her Lasix to an additional 20 mg in the afternoon. - BMP8+EGFR - CBC with Differential/Platelet - Hepatic function panel  2. Vitamin D deficiency -Check vitamin D levels and make decision regarding vitamin D treatment pending results of lab work - CBC with Differential/Platelet - VITAMIN D 25 Hydroxy (Vit-D Deficiency, Fractures)  3. Pure hypercholesterolemia -Continue aggressive therapeutic lifestyle changes - CBC with Differential/Platelet - Lipid panel  4. Stage 3 chronic kidney disease ([place]) -The most recent BMP suggested she may have stage IV chronic kidney disease currently.  The creatinine is more elevated. - CBC with Differential/Platelet  5. Bilateral lower extremity edema -Increase Lasix and take an additional 20 mg daily -Elevate both legs as much as possible higher than the level of the heart and apply saline compresses to the right leg when it is elevated. - CBC with Differential/Platelet - Ambulatory referral to  Wound Clinic  6. Pain and swelling of right lower leg -Increase Lasix as directed and take antibiotic - CBC with Differential/Platelet - Ambulatory referral to Wound Clinic  7. History of DVT of lower extremity -Monitor protimes closely because of history of DVT - CoaguChek XS/INR Waived - Ambulatory referral to Wound Clinic  8. Cellulitis of right leg -Keflex 503 times daily and increase Lasix to a total of 60 mg daily - Ambulatory referral to Wound Clinic  9. Chronic kidney disease (CKD), stage IV (severe) (HCC) -Check BMP today and another BMP on Monday along with pro time.  Meds ordered this encounter  Medications  . cephALEXin (KEFLEX) 500 MG capsule    Sig: Take 1 capsule (500 mg total) by mouth 3 (three) times daily.    Dispense:  30 capsule    Refill:  0  . furosemide (LASIX) 40 MG tablet    Sig: Take 1.5 tablets (60 mg total) by mouth daily.    Dispense:  45 tablet    Refill:  3    Short - term dose change = 60 mg   Patient Instructions                       Medicare Annual Wellness Visit  [facility] and the medical providers at [facility] strive to bring you the best medical care.  In doing so we not only want to address your current medical conditions and concerns but also to detect new conditions early and prevent illness, disease and health-related problems.    Medicare offers a yearly Wellness Visit which allows our clinical staff to assess your need for preventative services including immunizations, lifestyle education, counseling to decrease risk of preventable diseases and screening for fall risk and other medical concerns.    This visit is provided free of charge (no copay) for all Medicare recipients. The clinical pharmacists at [facility]  have begun to conduct these Wellness Visits which will also include a thorough review of all your medications.    As you primary medical provider recommend that you make an  appointment for your Annual Wellness Visit if you have not done so already this year.  You may set up this appointment before you leave today or you may call back ([phone]) and schedule an appointment.  Please make sure when you call that you mention that you are scheduling your Annual Wellness Visit with the clinical pharmacist so that the appointment may be made for the proper length of time.     Continue current medications. Continue good therapeutic lifestyle changes which include good diet and exercise. Fall precautions discussed with patient. If an FOBT was given today- please return it to our front desk. If you are over 16 years old - you may need Prevnar 83 or the adult Pneumonia vaccine.  **Flu shots are available--- please call and schedule a FLU-CLINIC appointment**  After your visit with [place] today you will receive a survey in the mail or online from [employer] regarding your care with [place]. Please take a moment to fill this out. Your feedback is very important to [place] as you can help [place] better understand your patient needs as well as improve your experience and satisfaction. WE CARE ABOUT YOU!!!   Increase Lasix and take 40 in the morning and 20 in the afternoon We will give her Keflex or cephalexin 500 mg 3 times daily and since she is allergic to penicillin we will have her watch closely for any rash from the Keflex. Please return the FOBT Schedule an appointment with the wound center at [facility] long We would ask her to come back to this office to check her leg again and get a weight the first of next week. She will also need to get another BMP at that time. We will get a CBC and BMP today and give her an FOBT to return because of her decreased hemoglobin. She should continue to wear support hose and apply saline gauze to the lower extremity when her leg is elevated. We will also get another pro time today.  She will return to the office on Monday for repeat pro time after elevating  the leg frequently and applying saline compresses. We will also schedule her for a visit with the wound clinic in [facility] for help with the situation with her leg   Arrie Senate MD

## 2019-01-01 NOTE — Patient Instructions (Addendum)
Medicare Annual Wellness Visit  [facility] and the medical providers at [facility] strive to bring you the best medical care.  In doing so we not only want to address your current medical conditions and concerns but also to detect new conditions early and prevent illness, disease and health-related problems.    Medicare offers a yearly Wellness Visit which allows our clinical staff to assess your need for preventative services including immunizations, lifestyle education, counseling to decrease risk of preventable diseases and screening for fall risk and other medical concerns.    This visit is provided free of charge (no copay) for all Medicare recipients. The clinical pharmacists at [facility] have begun to conduct these Wellness Visits which will also include a thorough review of all your medications.    As you primary medical provider recommend that you make an appointment for your Annual Wellness Visit if you have not done so already this year.  You may set up this appointment before you leave today or you may call back ([phone]) and schedule an appointment.  Please make sure when you call that you mention that you are scheduling your Annual Wellness Visit with the clinical pharmacist so that the appointment may be made for the proper length of time.     Continue current medications. Continue good therapeutic lifestyle changes which include good diet and exercise. Fall precautions discussed with patient. If an FOBT was given today- please return it to our front desk. If you are over 71 years old - you may need Prevnar 3 or the adult Pneumonia vaccine.  **Flu shots are available--- please call and schedule a FLU-CLINIC appointment**  After your visit with [place] today you will receive a survey in the mail or online from [employer] regarding your care with [place]. Please take a moment to fill this out. Your feedback is very  important to [place] as you can help [place] better understand your patient needs as well as improve your experience and satisfaction. WE CARE ABOUT YOU!!!   Increase Lasix and take 40 in the morning and 20 in the afternoon We will give her Keflex or cephalexin 500 mg 3 times daily and since she is allergic to penicillin we will have her watch closely for any rash from the Keflex. Please return the FOBT Schedule an appointment with the wound center at [facility] long We would ask her to come back to this office to check her leg again and get a weight the first of next week. She will also need to get another BMP at that time. We will get a CBC and BMP today and give her an FOBT to return because of her decreased hemoglobin. She should continue to wear support hose and apply saline gauze to the lower extremity when her leg is elevated. We will also get another pro time today.  She will return to the office on Monday for repeat pro time after elevating the leg frequently and applying saline compresses. We will also schedule her for a visit with the wound clinic in [facility] for help with the situation with her leg   Description   No warfarin today, tomorrow and plan on none Friday - see what C. Hawks says Friday.  Eat high vitamin K foods today and tomorrow Take precautions against falls and call 911 if any bleeding occurs  INR today is 5.4 (goal is 2-3)  Level is too thin today

## 2019-01-02 LAB — CBC WITH DIFFERENTIAL/PLATELET
BASOS: 0 %
Basophils Absolute: 0 10*3/uL (ref 0.0–0.2)
EOS (ABSOLUTE): 0.1 10*3/uL (ref 0.0–0.4)
EOS: 1 %
HEMATOCRIT: 31.1 % — AB (ref 34.0–46.6)
Hemoglobin: 10.1 g/dL — ABNORMAL LOW (ref 11.1–15.9)
IMMATURE GRANS (ABS): 0.1 10*3/uL (ref 0.0–0.1)
IMMATURE GRANULOCYTES: 1 %
LYMPHS: 13 %
Lymphocytes Absolute: 1.4 10*3/uL (ref 0.7–3.1)
MCH: 30.1 pg (ref 26.6–33.0)
MCHC: 32.5 g/dL (ref 31.5–35.7)
MCV: 93 fL (ref 79–97)
MONOS ABS: 1.2 10*3/uL — AB (ref 0.1–0.9)
Monocytes: 11 %
NEUTROS ABS: 7.8 10*3/uL — AB (ref 1.4–7.0)
NEUTROS PCT: 74 %
PLATELETS: 300 10*3/uL (ref 150–450)
RBC: 3.36 x10E6/uL — ABNORMAL LOW (ref 3.77–5.28)
RDW: 13.1 % (ref 11.7–15.4)
WBC: 10.6 10*3/uL (ref 3.4–10.8)

## 2019-01-02 LAB — HEPATIC FUNCTION PANEL
ALT: 25 IU/L (ref 0–32)
AST: 20 IU/L (ref 0–40)
Albumin: 4.2 g/dL (ref 3.5–4.6)
Alkaline Phosphatase: 132 IU/L — ABNORMAL HIGH (ref 39–117)
Bilirubin Total: <0.2 mg/dL (ref 0.0–1.2)
Bilirubin, Direct: 0.08 mg/dL (ref 0.00–0.40)
TOTAL PROTEIN: 6.5 g/dL (ref 6.0–8.5)

## 2019-01-02 LAB — BMP8+EGFR
BUN/Creatinine Ratio: 22 (ref 12–28)
BUN: 37 mg/dL — ABNORMAL HIGH (ref 10–36)
CALCIUM: 10.5 mg/dL — AB (ref 8.7–10.3)
CO2: 18 mmol/L — AB (ref 20–29)
CREATININE: 1.72 mg/dL — AB (ref 0.57–1.00)
Chloride: 109 mmol/L — ABNORMAL HIGH (ref 96–106)
GFR, EST AFRICAN AMERICAN: 30 mL/min/{1.73_m2} — AB (ref >59)
GFR, EST NON AFRICAN AMERICAN: 26 mL/min/{1.73_m2} — AB (ref >59)
Glucose: 76 mg/dL (ref 65–99)
POTASSIUM: 5.7 mmol/L — AB (ref 3.5–5.2)
Sodium: 143 mmol/L (ref 134–144)

## 2019-01-02 LAB — LIPID PANEL
CHOL/HDL RATIO: 1.9 ratio (ref 0.0–4.4)
Cholesterol, Total: 228 mg/dL — ABNORMAL HIGH (ref 100–199)
HDL: 119 mg/dL (ref >39)
LDL Calculated: 81 mg/dL (ref 0–99)
Triglycerides: 141 mg/dL (ref 0–149)
VLDL CHOLESTEROL CAL: 28 mg/dL (ref 5–40)

## 2019-01-02 LAB — VITAMIN D 25 HYDROXY (VIT D DEFICIENCY, FRACTURES): VIT D 25 HYDROXY: 12.4 ng/mL — AB (ref 30.0–100.0)

## 2019-01-03 ENCOUNTER — Ambulatory Visit: Payer: Medicare Other | Admitting: Family

## 2019-01-03 ENCOUNTER — Encounter: Payer: Self-pay | Admitting: Family

## 2019-01-03 VITALS — BP 135/50 | HR 53 | Temp 96.6°F | Ht 62.0 in | Wt 139.4 lb

## 2019-01-03 DIAGNOSIS — R609 Edema, unspecified: Secondary | ICD-10-CM

## 2019-01-03 DIAGNOSIS — L97919 Non-pressure chronic ulcer of unspecified part of right lower leg with unspecified severity: Secondary | ICD-10-CM

## 2019-01-03 DIAGNOSIS — Z86718 Personal history of other venous thrombosis and embolism: Secondary | ICD-10-CM | POA: Diagnosis not present

## 2019-01-03 DIAGNOSIS — Z7901 Long term (current) use of anticoagulants: Secondary | ICD-10-CM | POA: Diagnosis not present

## 2019-01-03 DIAGNOSIS — Z1211 Encounter for screening for malignant neoplasm of colon: Secondary | ICD-10-CM

## 2019-01-03 DIAGNOSIS — I83019 Varicose veins of right lower extremity with ulcer of unspecified site: Secondary | ICD-10-CM

## 2019-01-03 LAB — COAGUCHEK XS/INR WAIVED
INR: 2.5 — AB (ref 0.9–1.1)
PROTHROMBIN TIME: 29.9 s

## 2019-01-03 NOTE — Addendum Note (Signed)
Addended by: Nigel Berthold C on: 01/03/2019 01:45 PM   Modules accepted: Orders

## 2019-01-03 NOTE — Patient Instructions (Signed)
Description   INR today is 2.5 (goal is 2-3)  At goal!  Continue with current dose of 3 mg every day.

## 2019-01-03 NOTE — Addendum Note (Signed)
Addended by: Liliane Bade on: 01/03/2019 03:23 PM   Modules accepted: Orders

## 2019-01-03 NOTE — Progress Notes (Signed)
   Subjective:    Patient ID: Elizabeth Burch, female    DOB: [birth year], 85 y.o.   MRN: [id number]  Chief Complaint  Patient presents with  . check protime    HPI PT presents to the office today for INR check. She has a hx of DVT and is currently taking warfarin daily. Denies any new missed doses, but is currently taking antibiotics for leg wound. She has a follow up appt next week with wound care.   See anticoagulation flow sheet.    Review of Systems  Skin: Positive for wound.  All other systems reviewed and are negative.      Objective:   Physical Exam Vitals signs reviewed.  Constitutional:      General: She is not in acute distress.    Appearance: She is well-developed.  Neck:     Musculoskeletal: Normal range of motion and neck supple.     Thyroid: No thyromegaly.  Cardiovascular:     Rate and Rhythm: Normal rate and regular rhythm.     Heart sounds: Normal heart sounds. No murmur.  Pulmonary:     Effort: Pulmonary effort is normal. No respiratory distress.     Breath sounds: Normal breath sounds. No wheezing.  Abdominal:     General: Bowel sounds are normal. There is no distension.     Palpations: Abdomen is soft.     Tenderness: There is no abdominal tenderness.  Musculoskeletal: Normal range of motion.        General: No tenderness.     Right lower leg: Edema (2+ ) present.  Skin:    Comments: 2 venous ulcer on lower medial right lower leg with yellowing sloughing  Neurological:     Mental Status: She is alert and oriented to person, place, and time.     Cranial Nerves: No cranial nerve deficit.     Deep Tendon Reflexes: Reflexes are normal and symmetric.  Psychiatric:        Behavior: Behavior normal.        Thought Content: Thought content normal.        Judgment: Judgment normal.       BP (!) 135/50   Pulse (!) 53   Temp (!) 96.6 F (35.9 C) (Oral)   Ht 5\' 2"  (1.575 m)   Wt 139 lb 6.4 oz (63.2 kg)   BMI 25.50 kg/m      Assessment &  Plan:  Elizabeth Burch comes in today with chief complaint of check protime   Diagnosis and orders addressed:  1. History of DVT of lower extremity - CoaguChek XS/INR Waived  2. Chronic anticoagulation Description   INR today is 2.5 (goal is 2-3)  At goal!  Continue with current dose of 3 mg every day.    Keep follow up with PCP and follow up with Clinical Pharm in 1 month   3. Peripheral edema Keep elevated  4. Venous stasis ulcer of right lower extremity ([place]) Keep appt with wound care Continue dressing changes BID and keep clean and dry  Evelina Dun, FNP

## 2019-01-06 ENCOUNTER — Ambulatory Visit: Payer: Medicare Other | Admitting: Family Medicine

## 2019-01-06 ENCOUNTER — Encounter: Payer: Self-pay | Admitting: Family Medicine

## 2019-01-06 VITALS — BP 148/50 | HR 55 | Temp 97.9°F | Ht 62.0 in | Wt 140.0 lb

## 2019-01-06 DIAGNOSIS — N179 Acute kidney failure, unspecified: Secondary | ICD-10-CM | POA: Diagnosis not present

## 2019-01-06 DIAGNOSIS — L97211 Non-pressure chronic ulcer of right calf limited to breakdown of skin: Secondary | ICD-10-CM

## 2019-01-06 DIAGNOSIS — I83012 Varicose veins of right lower extremity with ulcer of calf: Secondary | ICD-10-CM | POA: Diagnosis not present

## 2019-01-06 DIAGNOSIS — N189 Chronic kidney disease, unspecified: Secondary | ICD-10-CM

## 2019-01-06 LAB — FECAL OCCULT BLOOD, IMMUNOCHEMICAL: Fecal Occult Bld: NEGATIVE

## 2019-01-06 NOTE — Progress Notes (Signed)
Subjective: CC: [name] PCP: Chipper Herb, MD Elizabeth Burch is a 85 y.o. female presenting to clinic today for:  1. [name] Patient here for recheck of renal function and venous stasis ulcer.  She is started on Keflex 500 mg 3 times daily last week by her PCP.  She is noted to have an elevated creatinine on last check to 1.72.  Her baseline appears to fluctuate between 1.44 and 1.76.  Her Lasix was also increased secondary to fluid overload within the lower extremities.  She notes good urine output.  No dizziness.  Denies any fevers, chills, purulent discharge from the ulcer.  She reports weeping of the legs have gotten quite a bit better.  She does report some decreased swelling but continues to have lower extremity edema.  She has follow-up with wound care on Wednesday.   ROS: Per HPI  Allergies  Allergen Reactions  . Prednisone Hypertension  . Vigamox [Moxifloxacin] Nausea And Vomiting  . Allopurinol   . Ace Inhibitors Other (See Comments)    unknown  . Alendronate Sodium Rash  . Ciprofloxacin Rash  . Clarithromycin Rash  . Clindamycin/Lincomycin Rash  . Nitrofurantoin Rash  . Ofloxacin Rash  . Penicillins Rash  . Sulfamethoxazole Rash  . Sulfonamide Derivatives Rash   Past Medical History:  Diagnosis Date  . Arthritis    Gout  . Cataract   . Chronic kidney disease, stage IV (severe) ([place])   . Diverticulosis of colon (without mention of hemorrhage) 2008  . DVT (deep venous thrombosis) ([place]) 1950  . External hemorrhoids without mention of complication 4650  . Fibrocystic breast disease   . GERD (gastroesophageal reflux disease) 2004  . Glaucoma   . Gout   . Hiatal hernia 2004  . HTN (hypertension)   . Palpitations   . Peripheral vascular disease (HCC)     Current Outpatient Medications:  .  amLODipine (NORVASC) 10 MG tablet, TAKE 1 TABLET BY MOUTH ONCE DAILY, Disp: 90 tablet, Rfl: 1 .  cephALEXin (KEFLEX) 500 MG capsule, Take 1 capsule (500 mg total) by mouth  3 (three) times daily., Disp: 30 capsule, Rfl: 0 .  colchicine (COLCRYS) 0.6 MG tablet, Take 1 tablet (0.6 mg total) by mouth 2 (two) times daily as needed., Disp: 60 tablet, Rfl: 0 .  furosemide (LASIX) 40 MG tablet, Take 1.5 tablets (60 mg total) by mouth daily., Disp: 45 tablet, Rfl: 3 .  loratadine (CLARITIN) 10 MG tablet, Take 1 tablet (10 mg total) by mouth daily as needed for itching., Disp: 15 tablet, Rfl: 0 .  losartan (COZAAR) 100 MG tablet, TAKE 1 TABLET BY MOUTH ONCE DAILY, Disp: 90 tablet, Rfl: 0 .  metoprolol succinate (TOPROL-XL) 25 MG 24 hr tablet, TAKE 1 & 1/2 (ONE & ONE-HALF) TABLETS BY MOUTH ONCE DAILY, Disp: 135 tablet, Rfl: 0 .  Multiple Vitamins-Minerals (PRESERVISION AREDS 2 PO), Take 1 capsule by mouth daily., Disp: , Rfl:  .  Omega-3 Fatty Acids (FISH OIL) 1000 MG CAPS, Take 1,000 mg by mouth daily., Disp: , Rfl:  .  ULORIC 40 MG tablet, TAKE 1 TABLET BY MOUTH ONCE DAILY, Disp: 90 tablet, Rfl: 3 .  warfarin (COUMADIN) 2 MG tablet, TAKE 1 & 1/2 (ONE & ONE-HALF) TABLETS BY MOUTH ONCE DAILY OR  AS  DIRECTED  BY  ANTICOAGULATION  CLINIC, Disp: 135 tablet, Rfl: 0 Social History   Socioeconomic History  . Marital status: Widowed    Spouse name: Gene  . Number of children: 1  .  Years of education: Not on file  . Highest education level: Not on file  Occupational History  . 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 Financial resource strain: Not hard at all  . Food insecurity:    Worry: Never true    Inability: Never true  . Transportation needs:    Medical: No    Non-medical: No  Tobacco Use  . Smoking status: Never Smoker  . Smokeless tobacco: Never Used  Substance and Sexual Activity  . Alcohol use: No  . Drug use: No  . Sexual activity: Never  Lifestyle  . Physical activity:    Days per week: 0 days    Minutes per session: 0 min  . Stress: Only a little  Relationships  . Social connections:     Talks on phone: More than three times a week    Gets together: Three times a week    Attends religious service: More than 4 times per year    Active member of club or organization: Yes    Attends meetings of clubs or organizations: 1 to 4 times per year    Relationship status: Widowed  . Intimate partner violence:    Fear of current or ex partner: No    Emotionally abused: No    Physically abused: No    Forced sexual activity: No  Other Topics Concern  . Not on file  Social History Narrative  . Not on file   Family History  Problem Relation Age of Onset  . Cancer Mother        stomach  . Cancer Sister        breast  . Heart disease Brother   . Heart attack Brother   . Cancer Sister        breast  . Cancer Sister        breast  . Cirrhosis Brother   . Alcohol abuse Brother   . Hyperlipidemia Brother   . Hypertension Brother   . Heart disease Brother   . Diabetes Brother   . Cancer Maternal Grandfather     Objective: Office vital signs reviewed. BP (!) 148/50   Pulse (!) 55   Temp 97.9 F (36.6 C) (Oral)   Ht _0  (1.575 m)   Wt 140 lb (63.5 kg)   BMI 25.61 kg/m   Physical Examination:  General: Awake, alert, well nourished, well appearing. No acute distress HEENT: MMM Pulm: normal work of breathing on room air Extremities: 2+ pitting edema to mid shins. No cyanosis or clubbing Skin: Several areas of skin breakdown with associated granulation tissue noted along the medial/posterior right calf.  These appear to be stable in size but improving in depth.  No active purulence or bleeding. No palpable fluctuance noted.  Assessment/ Plan: 85 y.o. female   1. Acute kidney injury superimposed on chronic kidney disease (HCC) Check BMP and potassium level.  She was slightly hyperkalemic on 1/29.  Continue current dose of Lasix for now.  May need to decrease if we see substantial drop in potassium or substantial increase in creatinine. - Basic Metabolic Panel  2.  Venous stasis ulcer of right calf limited to breakdown of skin, unspecified whether varicose veins present (HCC) Stable.  No evidence of worsening infection.  Keep appointment with wound care.  Met provided to patient with location.  Phone number also provided.   Orders Placed This Encounter  Procedures  .  Basic Metabolic Panel   No orders of the defined types were placed in this encounter.    Janora Norlander, DO [place] [phone]

## 2019-01-06 NOTE — Patient Instructions (Signed)
You had labs performed today.  You will be contacted with the results of the labs once they are available, usually in the next 3 business days for routine lab work.   The wound looks stable.  Keep the appointment with wound care.  I will call you with your labs and give you instructions about your lasix.

## 2019-01-07 ENCOUNTER — Ambulatory Visit: Payer: Medicare Other | Admitting: Family Medicine

## 2019-01-07 LAB — BASIC METABOLIC PANEL
BUN/Creatinine Ratio: 23 (ref 12–28)
BUN: 40 mg/dL — ABNORMAL HIGH (ref 10–36)
CO2: 19 mmol/L — AB (ref 20–29)
Calcium: 10.1 mg/dL (ref 8.7–10.3)
Chloride: 111 mmol/L — ABNORMAL HIGH (ref 96–106)
Creatinine, Ser: 1.75 mg/dL — ABNORMAL HIGH (ref 0.57–1.00)
GFR calc Af Amer: 29 mL/min/{1.73_m2} — ABNORMAL LOW (ref >59)
GFR, EST NON AFRICAN AMERICAN: 25 mL/min/{1.73_m2} — AB (ref >59)
Glucose: 88 mg/dL (ref 65–99)
POTASSIUM: 5.1 mmol/L (ref 3.5–5.2)
SODIUM: 145 mmol/L — AB (ref 134–144)

## 2019-01-08 ENCOUNTER — Encounter (HOSPITAL_BASED_OUTPATIENT_CLINIC_OR_DEPARTMENT_OTHER): Payer: Medicare Other | Attending: Internal Medicine

## 2019-01-08 DIAGNOSIS — Z86718 Personal history of other venous thrombosis and embolism: Secondary | ICD-10-CM | POA: Diagnosis not present

## 2019-01-08 DIAGNOSIS — Z881 Allergy status to other antibiotic agents status: Secondary | ICD-10-CM | POA: Insufficient documentation

## 2019-01-08 DIAGNOSIS — L97812 Non-pressure chronic ulcer of other part of right lower leg with fat layer exposed: Secondary | ICD-10-CM | POA: Insufficient documentation

## 2019-01-08 DIAGNOSIS — I739 Peripheral vascular disease, unspecified: Secondary | ICD-10-CM | POA: Diagnosis not present

## 2019-01-08 DIAGNOSIS — Z9862 Peripheral vascular angioplasty status: Secondary | ICD-10-CM | POA: Diagnosis not present

## 2019-01-08 DIAGNOSIS — I10 Essential (primary) hypertension: Secondary | ICD-10-CM | POA: Insufficient documentation

## 2019-01-08 DIAGNOSIS — I872 Venous insufficiency (chronic) (peripheral): Secondary | ICD-10-CM | POA: Insufficient documentation

## 2019-01-08 DIAGNOSIS — Z7901 Long term (current) use of anticoagulants: Secondary | ICD-10-CM | POA: Diagnosis not present

## 2019-01-14 ENCOUNTER — Ambulatory Visit (HOSPITAL_COMMUNITY)
Admission: RE | Admit: 2019-01-14 | Discharge: 2019-01-14 | Disposition: A | Discharge status: Home / Self Care | Payer: Medicare Other | Source: Ambulatory Visit | Attending: Vascular Surgery | Admitting: Vascular Surgery

## 2019-01-14 ENCOUNTER — Other Ambulatory Visit (HOSPITAL_COMMUNITY): Payer: Self-pay | Admitting: Physician Assistant

## 2019-01-14 DIAGNOSIS — L97929 Non-pressure chronic ulcer of unspecified part of left lower leg with unspecified severity: Secondary | ICD-10-CM | POA: Diagnosis not present

## 2019-01-14 DIAGNOSIS — L97919 Non-pressure chronic ulcer of unspecified part of right lower leg with unspecified severity: Secondary | ICD-10-CM

## 2019-01-15 DIAGNOSIS — L97812 Non-pressure chronic ulcer of other part of right lower leg with fat layer exposed: Secondary | ICD-10-CM | POA: Diagnosis not present

## 2019-01-20 ENCOUNTER — Other Ambulatory Visit: Payer: Self-pay | Admitting: Family Medicine

## 2019-01-20 ENCOUNTER — Telehealth: Payer: Self-pay | Admitting: Family Medicine

## 2019-01-20 NOTE — Telephone Encounter (Signed)
Verbal order given to [place] with [facility]

## 2019-01-20 NOTE — Telephone Encounter (Signed)
Please work this out to do home INR's weekly if okay with patient

## 2019-01-22 ENCOUNTER — Other Ambulatory Visit (HOSPITAL_COMMUNITY)
Admission: RE | Admit: 2019-01-22 | Discharge: 2019-01-22 | Disposition: A | Discharge status: Home / Self Care | Payer: Medicare Other | Source: Other Acute Inpatient Hospital | Attending: Physician Assistant | Admitting: Physician Assistant

## 2019-01-22 DIAGNOSIS — L97812 Non-pressure chronic ulcer of other part of right lower leg with fat layer exposed: Secondary | ICD-10-CM | POA: Insufficient documentation

## 2019-01-22 DIAGNOSIS — I872 Venous insufficiency (chronic) (peripheral): Secondary | ICD-10-CM | POA: Insufficient documentation

## 2019-01-24 ENCOUNTER — Ambulatory Visit (INDEPENDENT_AMBULATORY_CARE_PROVIDER_SITE_OTHER): Payer: Medicare Other

## 2019-01-24 DIAGNOSIS — H409 Unspecified glaucoma: Secondary | ICD-10-CM

## 2019-01-24 DIAGNOSIS — L97311 Non-pressure chronic ulcer of right ankle limited to breakdown of skin: Secondary | ICD-10-CM

## 2019-01-24 DIAGNOSIS — M109 Gout, unspecified: Secondary | ICD-10-CM

## 2019-01-24 DIAGNOSIS — Z86718 Personal history of other venous thrombosis and embolism: Secondary | ICD-10-CM

## 2019-01-24 DIAGNOSIS — I10 Essential (primary) hypertension: Secondary | ICD-10-CM | POA: Diagnosis not present

## 2019-01-24 DIAGNOSIS — I872 Venous insufficiency (chronic) (peripheral): Secondary | ICD-10-CM | POA: Diagnosis not present

## 2019-01-24 DIAGNOSIS — I739 Peripheral vascular disease, unspecified: Secondary | ICD-10-CM

## 2019-01-24 DIAGNOSIS — L97811 Non-pressure chronic ulcer of other part of right lower leg limited to breakdown of skin: Secondary | ICD-10-CM | POA: Diagnosis not present

## 2019-01-24 DIAGNOSIS — Z7901 Long term (current) use of anticoagulants: Secondary | ICD-10-CM

## 2019-01-27 LAB — AEROBIC CULTURE W GRAM STAIN (SUPERFICIAL SPECIMEN): Gram Stain: NONE SEEN

## 2019-01-27 LAB — AEROBIC CULTURE  (SUPERFICIAL SPECIMEN)

## 2019-01-29 DIAGNOSIS — L97812 Non-pressure chronic ulcer of other part of right lower leg with fat layer exposed: Secondary | ICD-10-CM | POA: Diagnosis not present

## 2019-02-03 ENCOUNTER — Telehealth: Payer: Self-pay | Admitting: *Deleted

## 2019-02-03 ENCOUNTER — Other Ambulatory Visit: Payer: Self-pay | Admitting: Family Medicine

## 2019-02-03 NOTE — Telephone Encounter (Signed)
VM from [place] w/ [facility] INR today 6.6 taking 3 mg, nurse instructed pt not to take tonight's dose

## 2019-02-03 NOTE — Telephone Encounter (Signed)
Pt needs to be seen. Continue to hold today and tomorrow's dose. She may need K+ and needs to have hgb drawn.

## 2019-02-04 NOTE — Telephone Encounter (Signed)
Patient aware and appt made 

## 2019-02-05 ENCOUNTER — Ambulatory Visit: Payer: Medicare Other | Admitting: Family Medicine

## 2019-02-05 ENCOUNTER — Encounter: Payer: Medicare Other | Admitting: Pharmacist Clinician (PhC)/ Clinical Pharmacy Specialist

## 2019-02-05 ENCOUNTER — Encounter: Payer: Self-pay | Admitting: Family Medicine

## 2019-02-05 ENCOUNTER — Encounter (HOSPITAL_BASED_OUTPATIENT_CLINIC_OR_DEPARTMENT_OTHER): Payer: Medicare Other | Attending: Internal Medicine

## 2019-02-05 VITALS — BP 157/53 | HR 53 | Temp 96.7°F | Ht 62.0 in | Wt 143.0 lb

## 2019-02-05 DIAGNOSIS — Z7901 Long term (current) use of anticoagulants: Secondary | ICD-10-CM | POA: Diagnosis not present

## 2019-02-05 DIAGNOSIS — Z86718 Personal history of other venous thrombosis and embolism: Secondary | ICD-10-CM | POA: Diagnosis not present

## 2019-02-05 DIAGNOSIS — I10 Essential (primary) hypertension: Secondary | ICD-10-CM | POA: Diagnosis not present

## 2019-02-05 DIAGNOSIS — Z9862 Peripheral vascular angioplasty status: Secondary | ICD-10-CM | POA: Insufficient documentation

## 2019-02-05 DIAGNOSIS — I872 Venous insufficiency (chronic) (peripheral): Secondary | ICD-10-CM | POA: Insufficient documentation

## 2019-02-05 DIAGNOSIS — L97812 Non-pressure chronic ulcer of other part of right lower leg with fat layer exposed: Secondary | ICD-10-CM | POA: Diagnosis present

## 2019-02-05 LAB — COAGUCHEK XS/INR WAIVED
INR: 3.9 — ABNORMAL HIGH (ref 0.9–1.1)
Prothrombin Time: 46.5 s

## 2019-02-05 NOTE — Progress Notes (Signed)
Patient here today for anticoagulation visit only. She was checked by the home health nurse and it was 6.6. she was told to [facility] and Tuesday dose. Today her reading is 3.9. We will hold 2 more days, WED and THURS. And the Home health nurse will recheck INR on Friday this week.      BP (!) 157/53 (BP Location: Left Arm)   Pulse (!) 53   Temp (!) 96.7 F (35.9 C) (Oral)   Ht 5\' 2"  (1.575 m)   Wt 143 lb (64.9 kg)   BMI 26.16 kg/m

## 2019-02-06 ENCOUNTER — Ambulatory Visit: Payer: Medicare Other | Admitting: Family

## 2019-02-07 ENCOUNTER — Telehealth: Payer: Self-pay | Admitting: *Deleted

## 2019-02-07 NOTE — Telephone Encounter (Signed)
Elizabeth Burch nurse w/ [facility] Pt's PT & INR this morning INR 1.9 PT 22.3 she has been hold her coumadin as instructed

## 2019-02-07 NOTE — Telephone Encounter (Signed)
Pt called and aware and will see [place] next wed afternoon

## 2019-02-07 NOTE — Telephone Encounter (Signed)
Resume 2 mg daily of Coumadin and check pro time next Wednesday

## 2019-02-12 ENCOUNTER — Other Ambulatory Visit: Payer: Self-pay

## 2019-02-12 ENCOUNTER — Encounter: Payer: Self-pay | Admitting: Family Medicine

## 2019-02-12 ENCOUNTER — Ambulatory Visit (INDEPENDENT_AMBULATORY_CARE_PROVIDER_SITE_OTHER): Payer: Medicare Other | Admitting: Family Medicine

## 2019-02-12 DIAGNOSIS — L97812 Non-pressure chronic ulcer of other part of right lower leg with fat layer exposed: Secondary | ICD-10-CM | POA: Diagnosis not present

## 2019-02-12 DIAGNOSIS — Z7901 Long term (current) use of anticoagulants: Secondary | ICD-10-CM | POA: Diagnosis not present

## 2019-02-12 DIAGNOSIS — Z86718 Personal history of other venous thrombosis and embolism: Secondary | ICD-10-CM | POA: Diagnosis not present

## 2019-02-12 LAB — COAGUCHEK XS/INR WAIVED
INR: 4.4 — ABNORMAL HIGH (ref 0.9–1.1)
PROTHROMBIN TIME: 52.4 s

## 2019-02-12 NOTE — Patient Instructions (Signed)
Description   INR today is 4.4  (goal is 2-3)   Repeat level in 1 week

## 2019-02-12 NOTE — Progress Notes (Signed)
Patient here today for protime / INR check up. She is currently 4.4 today and has been taking 2 mg tabs - one a day.   Patient Active Problem List   Diagnosis Date Noted  . History of DVT of lower extremity 11/13/2016  . Chronic anticoagulation 11/13/2016  . Statin intolerance 03/19/2015  . Cardiomegaly 03/19/2015  . Osteoporosis 12/24/2013  . Kyphosis 11/03/2013  . Acute venous embolism and thrombosis of deep vessels of distal lower extremity ([place]) 08/26/2013  . 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 Fibrocystic disease of right breast 05/22/2013  . DVT of leg (deep venous thrombosis) ([place]) 02/16/2013  . Leg edema, left 09/16/2012  . Gout   . Stage 3 chronic kidney disease ([place]) 01/08/2012  . Multinodular thyroid 10/25/2011  . Fibrocystic breast disease, left.   . Open-angle glaucoma   . Hyperlipidemia 01/06/2009  . Essential hypertension 01/06/2009   Outpatient Encounter Medications as of 02/12/2019  Medication Sig  . amLODipine (NORVASC) 10 MG tablet TAKE 1 TABLET BY MOUTH ONCE DAILY  . colchicine (COLCRYS) 0.6 MG tablet Take 1 tablet (0.6 mg total) by mouth 2 (two) times daily as needed.  . furosemide (LASIX) 40 MG tablet Take 1.5 tablets (60 mg total) by mouth daily.  Marland Kitchen loratadine (CLARITIN) 10 MG tablet Take 1 tablet (10 mg total) by mouth daily as needed for itching.  . losartan (COZAAR) 100 MG tablet TAKE 1 TABLET BY MOUTH ONCE DAILY  . metoprolol succinate (TOPROL-XL) 25 MG 24 hr tablet TAKE 1 & 1/2 (ONE & ONE-HALF) TABLETS BY MOUTH ONCE DAILY  . Multiple Vitamins-Minerals (PRESERVISION AREDS 2 PO) Take 1 capsule by mouth daily.  . Omega-3 Fatty Acids (FISH OIL) 1000 MG CAPS Take 1,000 mg by mouth daily.  Marland Kitchen ULORIC 40 MG tablet TAKE 1 TABLET BY MOUTH ONCE DAILY  . warfarin (COUMADIN) 2 MG tablet TAKE 1 & 1/2 (ONE & ONE-HALF) TABLETS BY MOUTH ONCE DAILY OR AS DIRECTED BY COUMADIN CLINIC   No facility-administered encounter medications on file as of 02/12/2019.       BP (!) 164/55 (BP Location: Left Arm)   Pulse (!) 53   Temp (!) 96.6 F (35.9 C) (Oral)   Ht 5\' 2"  (1.575 m)   Wt 147 lb (66.7 kg)   BMI 26.89 kg/m   The patient has been on 2 mg daily and even with this her INR is still high at 4.4.  We will have her to hold the Coumadin for 2 nights and restart in 2 days taking 2 mg on Monday Wednesday and Friday and 1 mg on all other days.  This was explained to the patient and she was given a schedule to follow.  She will have a repeat INR early next Wednesday morning.  Arrie Senate MD

## 2019-02-12 NOTE — Addendum Note (Signed)
Addended by: Zannie Cove on: 02/12/2019 03:13 PM   Modules accepted: Orders

## 2019-02-19 ENCOUNTER — Other Ambulatory Visit: Payer: Self-pay

## 2019-02-19 ENCOUNTER — Encounter: Payer: Self-pay | Admitting: Family Medicine

## 2019-02-19 ENCOUNTER — Ambulatory Visit: Payer: Medicare Other | Admitting: Family Medicine

## 2019-02-19 DIAGNOSIS — Z86718 Personal history of other venous thrombosis and embolism: Secondary | ICD-10-CM

## 2019-02-19 DIAGNOSIS — L97812 Non-pressure chronic ulcer of other part of right lower leg with fat layer exposed: Secondary | ICD-10-CM | POA: Diagnosis not present

## 2019-02-19 DIAGNOSIS — Z7901 Long term (current) use of anticoagulants: Secondary | ICD-10-CM

## 2019-02-19 LAB — COAGUCHEK XS/INR WAIVED
INR: 1.7 — AB (ref 0.9–1.1)
Prothrombin Time: 20.8 s

## 2019-02-19 NOTE — Patient Instructions (Signed)
Description   INR today is 1.7  (goal is 2-3)   Take 1 mg on MON, WED, FRI Take 2mg  on SAT, SUN, TUES and THURS Repeat level in 1 week with Home health  (due 02/26/19)

## 2019-02-19 NOTE — Progress Notes (Signed)
Patient come in today for INR only. Her last was 4.4, too thin. Today she is 1.7 slight;y think. Here are the changes for today.  Description   INR today is 1.7  (goal is 2-3)   Take 1 mg on MON, WED, FRI Take 2mg  on SAT, SUN, TUES and THURS Repeat level in 1 week with Home health  (due 02/26/19)       BP (!) 142/53 (BP Location: Left Arm)   Pulse (!) 53   Temp 97.8 F (36.6 C) (Oral)   Ht 5\' 2"  (1.575 m)   Wt 147 lb (66.7 kg)   BMI 26.89 kg/m   Arrie Senate MD

## 2019-02-26 ENCOUNTER — Telehealth: Payer: Self-pay

## 2019-02-26 DIAGNOSIS — Z7901 Long term (current) use of anticoagulants: Secondary | ICD-10-CM

## 2019-02-26 DIAGNOSIS — Z86718 Personal history of other venous thrombosis and embolism: Secondary | ICD-10-CM

## 2019-02-26 NOTE — Telephone Encounter (Signed)
The patient should be instructed to take 2 mg of Coumadin every day except 1 mg on Monday Wednesday and Friday and repeat her INR next Wednesday or in 1 week

## 2019-02-26 NOTE — Patient Instructions (Addendum)
Continue current medications. Continue good therapeutic lifestyle changes which include good diet and exercise. Fall precautions discussed with patient. If an FOBT was given today- please return it to our front desk. If you are over 83 years old - you may need Prevnar 56 or the adult Pneumonia vaccine.  **Flu shots are available--- please call and schedule a FLU-CLINIC appointment**  After your visit with [place] today you will receive a survey in the mail or online from [employer] regarding your care with [place]. Please take a moment to fill this out. Your feedback is very important to [place] as you can help [place] better understand your patient needs as well as improve your experience and satisfaction. WE CARE ABOUT YOU!!!   Description   INR today is 1.8  (goal is 2-3)   Take 1 mg on MON and FRI Take 2mg  on SAT, SUN, TUES and THURS Repeat level in 1 week with Home health  (due 03/05/19)

## 2019-02-26 NOTE — Telephone Encounter (Signed)
INR 1.8  PT 21.0   Patient taking 2 mg one day next day 1 mg and alternating like that

## 2019-02-26 NOTE — Telephone Encounter (Signed)
Anne Shutter nurse advised

## 2019-02-26 NOTE — Telephone Encounter (Signed)
Pt aware and anticoag track updated  Elizabeth Burch- can you call [facility] nurse and let her know to recheck on next [facility] 03/05/19. And call it to [facility]

## 2019-03-03 ENCOUNTER — Telehealth: Payer: Self-pay | Admitting: *Deleted

## 2019-03-03 NOTE — Telephone Encounter (Signed)
VM from [place] w/ [facility] Pt's results today INR 1.2 PT 13.8 No change in medication 1 mg on Monday & Friday, 2 mg all other days

## 2019-03-03 NOTE — Telephone Encounter (Signed)
Indication: hx of VTE.  Goal 2-3.  She is subtherapeutic w/ INR 1.2.  I would like her to increase to 2 mg daily of Coumadin and recheck in 1 week.

## 2019-03-03 NOTE — Telephone Encounter (Signed)
Left detailed mssg on VM to increase Coumadin to 2 mg daily and rck in 1 wk

## 2019-03-04 NOTE — Telephone Encounter (Signed)
These make sure that patient is aware of the Coumadin dose change due to diminished INR.  Request a repeat INR by the end of the week if necessary or certainly in 1 week.

## 2019-03-05 ENCOUNTER — Encounter (HOSPITAL_BASED_OUTPATIENT_CLINIC_OR_DEPARTMENT_OTHER): Payer: Medicare Other | Attending: Physician Assistant

## 2019-03-05 ENCOUNTER — Other Ambulatory Visit: Payer: Self-pay

## 2019-03-05 DIAGNOSIS — I10 Essential (primary) hypertension: Secondary | ICD-10-CM | POA: Insufficient documentation

## 2019-03-05 DIAGNOSIS — Z872 Personal history of diseases of the skin and subcutaneous tissue: Secondary | ICD-10-CM | POA: Diagnosis not present

## 2019-03-05 DIAGNOSIS — I872 Venous insufficiency (chronic) (peripheral): Secondary | ICD-10-CM | POA: Insufficient documentation

## 2019-03-05 DIAGNOSIS — Z86718 Personal history of other venous thrombosis and embolism: Secondary | ICD-10-CM | POA: Diagnosis not present

## 2019-03-05 DIAGNOSIS — Z09 Encounter for follow-up examination after completed treatment for conditions other than malignant neoplasm: Secondary | ICD-10-CM | POA: Diagnosis present

## 2019-03-05 DIAGNOSIS — Z9862 Peripheral vascular angioplasty status: Secondary | ICD-10-CM | POA: Insufficient documentation

## 2019-03-17 ENCOUNTER — Other Ambulatory Visit: Payer: Self-pay | Admitting: Family Medicine

## 2019-03-17 ENCOUNTER — Telehealth: Payer: Self-pay

## 2019-03-17 DIAGNOSIS — Z86718 Personal history of other venous thrombosis and embolism: Secondary | ICD-10-CM

## 2019-03-17 DIAGNOSIS — Z7901 Long term (current) use of anticoagulants: Secondary | ICD-10-CM

## 2019-03-17 NOTE — Telephone Encounter (Signed)
Coumadin 2mg - 1 tablet- daily and repeat INR monday

## 2019-03-17 NOTE — Telephone Encounter (Signed)
INR 1.4  PT 16.3  Patient taking 2 mg daily   DWM patient

## 2019-03-17 NOTE — Telephone Encounter (Signed)
Notified patient of instructions from MMM.  Patient is unsure if [facility] nurse will be coming back to check her PT/INR next Monday.  Advised patient I will check on this and let her know.

## 2019-03-18 NOTE — Telephone Encounter (Signed)
Note given to [facility] - we will continue weekly [facility] visits for pt/inr

## 2019-03-19 ENCOUNTER — Other Ambulatory Visit: Payer: Self-pay | Admitting: Family Medicine

## 2019-03-19 DIAGNOSIS — Z09 Encounter for follow-up examination after completed treatment for conditions other than malignant neoplasm: Secondary | ICD-10-CM | POA: Diagnosis not present

## 2019-03-19 DIAGNOSIS — I10 Essential (primary) hypertension: Secondary | ICD-10-CM

## 2019-03-24 ENCOUNTER — Telehealth: Payer: Self-pay | Admitting: *Deleted

## 2019-03-24 NOTE — Telephone Encounter (Signed)
VM from [place] w/ [facility] INR 2.0 Pt taking 2 mg QD, not med changes, has not missed any doses [facility] is with her 2 more weeks, will be glad to draw any repeat labwork

## 2019-03-24 NOTE — Telephone Encounter (Signed)
Continue Coumadin at current dose, please check another INR in 1 week.

## 2019-03-24 NOTE — Telephone Encounter (Signed)
[place] called and detailed VM left - aware no changes and recheck in 1 week

## 2019-03-31 ENCOUNTER — Telehealth: Payer: Self-pay | Admitting: *Deleted

## 2019-03-31 DIAGNOSIS — Z86718 Personal history of other venous thrombosis and embolism: Secondary | ICD-10-CM

## 2019-03-31 DIAGNOSIS — Z7901 Long term (current) use of anticoagulants: Secondary | ICD-10-CM

## 2019-03-31 NOTE — Telephone Encounter (Signed)
Pt aware of new directions

## 2019-03-31 NOTE — Telephone Encounter (Signed)
Pt is currently taking 2 mg everyday- 7 days a week.  And last was on 03/24/19 and was 2.0.  They check her weekly.  Call pt at home # with directions

## 2019-03-31 NOTE — Telephone Encounter (Signed)
Hold coumadin today then 1/2 tablet tomorrow then back on 2mg  daily- recheck in 2 weeks.

## 2019-04-07 ENCOUNTER — Telehealth: Payer: Self-pay | Admitting: *Deleted

## 2019-04-07 NOTE — Telephone Encounter (Signed)
Take an extra half of a Coumadin today only and then resume 2 mg daily and recheck an INR in 1 week.  Or next Wednesday.

## 2019-04-07 NOTE — Telephone Encounter (Signed)
VM from [place] w/ [facility] INR 1.8 Pt taking 2 mg daily This is home health's last visit Please call pt with any changes, she will have to come to office for further testing or we could do paperwork to get her started with [facility]IN[facility] home testing

## 2019-04-07 NOTE — Telephone Encounter (Signed)
Pt called and aware

## 2019-04-15 ENCOUNTER — Other Ambulatory Visit: Payer: Self-pay

## 2019-04-16 ENCOUNTER — Ambulatory Visit: Payer: Medicare Other | Admitting: Pharmacist Clinician (PhC)/ Clinical Pharmacy Specialist

## 2019-04-16 ENCOUNTER — Encounter: Payer: Self-pay | Admitting: Pharmacist Clinician (PhC)/ Clinical Pharmacy Specialist

## 2019-04-16 DIAGNOSIS — Z7901 Long term (current) use of anticoagulants: Secondary | ICD-10-CM

## 2019-04-16 DIAGNOSIS — Z86718 Personal history of other venous thrombosis and embolism: Secondary | ICD-10-CM | POA: Diagnosis not present

## 2019-04-16 LAB — COAGUCHEK XS/INR WAIVED
INR: 2.7 — ABNORMAL HIGH (ref 0.9–1.1)
Prothrombin Time: 32.9 s

## 2019-04-16 NOTE — Patient Instructions (Signed)
Description   INR today is 2.7  (goal is 2-3)  Perfect reading! Continue taking 2mg  every day

## 2019-04-18 ENCOUNTER — Ambulatory Visit (INDEPENDENT_AMBULATORY_CARE_PROVIDER_SITE_OTHER): Payer: Medicare Other

## 2019-04-18 ENCOUNTER — Other Ambulatory Visit: Payer: Self-pay

## 2019-04-18 DIAGNOSIS — Z86718 Personal history of other venous thrombosis and embolism: Secondary | ICD-10-CM

## 2019-04-18 DIAGNOSIS — Z7901 Long term (current) use of anticoagulants: Secondary | ICD-10-CM

## 2019-04-18 DIAGNOSIS — I739 Peripheral vascular disease, unspecified: Secondary | ICD-10-CM

## 2019-04-18 DIAGNOSIS — Z9862 Peripheral vascular angioplasty status: Secondary | ICD-10-CM

## 2019-04-18 DIAGNOSIS — I10 Essential (primary) hypertension: Secondary | ICD-10-CM | POA: Diagnosis not present

## 2019-04-18 DIAGNOSIS — I872 Venous insufficiency (chronic) (peripheral): Secondary | ICD-10-CM | POA: Diagnosis not present

## 2019-04-18 DIAGNOSIS — H409 Unspecified glaucoma: Secondary | ICD-10-CM

## 2019-04-18 DIAGNOSIS — L97811 Non-pressure chronic ulcer of other part of right lower leg limited to breakdown of skin: Secondary | ICD-10-CM

## 2019-04-18 DIAGNOSIS — M109 Gout, unspecified: Secondary | ICD-10-CM

## 2019-04-18 DIAGNOSIS — Z9181 History of falling: Secondary | ICD-10-CM

## 2019-04-19 ENCOUNTER — Emergency Department (HOSPITAL_COMMUNITY): Payer: Medicare Other

## 2019-04-19 ENCOUNTER — Inpatient Hospital Stay (HOSPITAL_COMMUNITY)
Admission: EM | Admit: 2019-04-19 | Discharge: 2019-04-30 | DRG: 205 | Payer: Medicare Other | Attending: Internal Medicine | Admitting: Internal Medicine

## 2019-04-19 ENCOUNTER — Other Ambulatory Visit: Payer: Self-pay

## 2019-04-19 ENCOUNTER — Encounter (HOSPITAL_COMMUNITY): Payer: Self-pay | Admitting: Emergency Medicine

## 2019-04-19 DIAGNOSIS — Z86718 Personal history of other venous thrombosis and embolism: Secondary | ICD-10-CM

## 2019-04-19 DIAGNOSIS — Z7189 Other specified counseling: Secondary | ICD-10-CM

## 2019-04-19 DIAGNOSIS — R29713 NIHSS score 13: Secondary | ICD-10-CM | POA: Diagnosis not present

## 2019-04-19 DIAGNOSIS — M1990 Unspecified osteoarthritis, unspecified site: Secondary | ICD-10-CM | POA: Diagnosis present

## 2019-04-19 DIAGNOSIS — Z881 Allergy status to other antibiotic agents status: Secondary | ICD-10-CM

## 2019-04-19 DIAGNOSIS — G9340 Encephalopathy, unspecified: Secondary | ICD-10-CM

## 2019-04-19 DIAGNOSIS — R296 Repeated falls: Secondary | ICD-10-CM

## 2019-04-19 DIAGNOSIS — K449 Diaphragmatic hernia without obstruction or gangrene: Secondary | ICD-10-CM | POA: Diagnosis present

## 2019-04-19 DIAGNOSIS — I6340 Cerebral infarction due to embolism of unspecified cerebral artery: Secondary | ICD-10-CM | POA: Diagnosis not present

## 2019-04-19 DIAGNOSIS — Z66 Do not resuscitate: Secondary | ICD-10-CM | POA: Diagnosis present

## 2019-04-19 DIAGNOSIS — E785 Hyperlipidemia, unspecified: Secondary | ICD-10-CM | POA: Diagnosis present

## 2019-04-19 DIAGNOSIS — Z800 Family history of malignant neoplasm of digestive organs: Secondary | ICD-10-CM

## 2019-04-19 DIAGNOSIS — S2242XA Multiple fractures of ribs, left side, initial encounter for closed fracture: Secondary | ICD-10-CM | POA: Diagnosis present

## 2019-04-19 DIAGNOSIS — Z803 Family history of malignant neoplasm of breast: Secondary | ICD-10-CM

## 2019-04-19 DIAGNOSIS — R109 Unspecified abdominal pain: Secondary | ICD-10-CM

## 2019-04-19 DIAGNOSIS — N184 Chronic kidney disease, stage 4 (severe): Secondary | ICD-10-CM | POA: Diagnosis present

## 2019-04-19 DIAGNOSIS — G9341 Metabolic encephalopathy: Secondary | ICD-10-CM | POA: Diagnosis not present

## 2019-04-19 DIAGNOSIS — S27321A Contusion of lung, unilateral, initial encounter: Secondary | ICD-10-CM | POA: Diagnosis not present

## 2019-04-19 DIAGNOSIS — B9620 Unspecified Escherichia coli [E. coli] as the cause of diseases classified elsewhere: Secondary | ICD-10-CM | POA: Diagnosis not present

## 2019-04-19 DIAGNOSIS — R4701 Aphasia: Secondary | ICD-10-CM | POA: Diagnosis not present

## 2019-04-19 DIAGNOSIS — Z8249 Family history of ischemic heart disease and other diseases of the circulatory system: Secondary | ICD-10-CM

## 2019-04-19 DIAGNOSIS — R55 Syncope and collapse: Secondary | ICD-10-CM

## 2019-04-19 DIAGNOSIS — Z6822 Body mass index (BMI) 22.0-22.9, adult: Secondary | ICD-10-CM

## 2019-04-19 DIAGNOSIS — I482 Chronic atrial fibrillation, unspecified: Secondary | ICD-10-CM | POA: Diagnosis present

## 2019-04-19 DIAGNOSIS — Z811 Family history of alcohol abuse and dependence: Secondary | ICD-10-CM

## 2019-04-19 DIAGNOSIS — Z7901 Long term (current) use of anticoagulants: Secondary | ICD-10-CM

## 2019-04-19 DIAGNOSIS — Z888 Allergy status to other drugs, medicaments and biological substances status: Secondary | ICD-10-CM

## 2019-04-19 DIAGNOSIS — Z882 Allergy status to sulfonamides status: Secondary | ICD-10-CM

## 2019-04-19 DIAGNOSIS — R627 Adult failure to thrive: Secondary | ICD-10-CM | POA: Diagnosis present

## 2019-04-19 DIAGNOSIS — Z833 Family history of diabetes mellitus: Secondary | ICD-10-CM

## 2019-04-19 DIAGNOSIS — W1830XA Fall on same level, unspecified, initial encounter: Secondary | ICD-10-CM | POA: Diagnosis present

## 2019-04-19 DIAGNOSIS — N390 Urinary tract infection, site not specified: Secondary | ICD-10-CM | POA: Diagnosis not present

## 2019-04-19 DIAGNOSIS — K5730 Diverticulosis of large intestine without perforation or abscess without bleeding: Secondary | ICD-10-CM | POA: Diagnosis present

## 2019-04-19 DIAGNOSIS — K219 Gastro-esophageal reflux disease without esophagitis: Secondary | ICD-10-CM | POA: Diagnosis present

## 2019-04-19 DIAGNOSIS — Z515 Encounter for palliative care: Secondary | ICD-10-CM | POA: Diagnosis not present

## 2019-04-19 DIAGNOSIS — I10 Essential (primary) hypertension: Secondary | ICD-10-CM | POA: Diagnosis present

## 2019-04-19 DIAGNOSIS — I071 Rheumatic tricuspid insufficiency: Secondary | ICD-10-CM | POA: Diagnosis present

## 2019-04-19 DIAGNOSIS — R140 Abdominal distension (gaseous): Secondary | ICD-10-CM

## 2019-04-19 DIAGNOSIS — Z20828 Contact with and (suspected) exposure to other viral communicable diseases: Secondary | ICD-10-CM | POA: Diagnosis present

## 2019-04-19 DIAGNOSIS — I129 Hypertensive chronic kidney disease with stage 1 through stage 4 chronic kidney disease, or unspecified chronic kidney disease: Secondary | ICD-10-CM | POA: Diagnosis present

## 2019-04-19 DIAGNOSIS — S2232XA Fracture of one rib, left side, initial encounter for closed fracture: Secondary | ICD-10-CM

## 2019-04-19 DIAGNOSIS — R52 Pain, unspecified: Secondary | ICD-10-CM

## 2019-04-19 DIAGNOSIS — H409 Unspecified glaucoma: Secondary | ICD-10-CM | POA: Diagnosis present

## 2019-04-19 DIAGNOSIS — M810 Age-related osteoporosis without current pathological fracture: Secondary | ICD-10-CM | POA: Diagnosis present

## 2019-04-19 DIAGNOSIS — R339 Retention of urine, unspecified: Secondary | ICD-10-CM | POA: Diagnosis not present

## 2019-04-19 DIAGNOSIS — H4010X0 Unspecified open-angle glaucoma, stage unspecified: Secondary | ICD-10-CM | POA: Diagnosis present

## 2019-04-19 DIAGNOSIS — I739 Peripheral vascular disease, unspecified: Secondary | ICD-10-CM | POA: Diagnosis present

## 2019-04-19 DIAGNOSIS — Z880 Allergy status to penicillin: Secondary | ICD-10-CM

## 2019-04-19 DIAGNOSIS — Y92010 Kitchen of single-family (private) house as the place of occurrence of the external cause: Secondary | ICD-10-CM

## 2019-04-19 DIAGNOSIS — M109 Gout, unspecified: Secondary | ICD-10-CM | POA: Diagnosis present

## 2019-04-19 LAB — CBC WITH DIFFERENTIAL/PLATELET
Abs Immature Granulocytes: 0.03 10*3/uL (ref 0.00–0.07)
Basophils Absolute: 0 10*3/uL (ref 0.0–0.1)
Basophils Relative: 0 %
Eosinophils Absolute: 0 10*3/uL (ref 0.0–0.5)
Eosinophils Relative: 0 %
HCT: 35.2 % — ABNORMAL LOW (ref 36.0–46.0)
Hemoglobin: 10.7 g/dL — ABNORMAL LOW (ref 12.0–15.0)
Immature Granulocytes: 0 %
Lymphocytes Relative: 14 %
Lymphs Abs: 1.2 10*3/uL (ref 0.7–4.0)
MCH: 27.9 pg (ref 26.0–34.0)
MCHC: 30.4 g/dL (ref 30.0–36.0)
MCV: 91.9 fL (ref 80.0–100.0)
Monocytes Absolute: 1 10*3/uL (ref 0.1–1.0)
Monocytes Relative: 11 %
Neutro Abs: 6.4 10*3/uL (ref 1.7–7.7)
Neutrophils Relative %: 75 %
Platelets: 224 10*3/uL (ref 150–400)
RBC: 3.83 MIL/uL — ABNORMAL LOW (ref 3.87–5.11)
RDW: 17.5 % — ABNORMAL HIGH (ref 11.5–15.5)
WBC: 8.6 10*3/uL (ref 4.0–10.5)
nRBC: 0 % (ref 0.0–0.2)

## 2019-04-19 LAB — COMPREHENSIVE METABOLIC PANEL
ALT: 27 U/L (ref 0–44)
AST: 27 U/L (ref 15–41)
Albumin: 3.5 g/dL (ref 3.5–5.0)
Alkaline Phosphatase: 129 U/L — ABNORMAL HIGH (ref 38–126)
Anion gap: 10 (ref 5–15)
BUN: 39 mg/dL — ABNORMAL HIGH (ref 8–23)
CO2: 20 mmol/L — ABNORMAL LOW (ref 22–32)
Calcium: 10.2 mg/dL (ref 8.9–10.3)
Chloride: 115 mmol/L — ABNORMAL HIGH (ref 98–111)
Creatinine, Ser: 1.79 mg/dL — ABNORMAL HIGH (ref 0.44–1.00)
GFR calc Af Amer: 28 mL/min — ABNORMAL LOW (ref >60)
GFR calc non Af Amer: 25 mL/min — ABNORMAL LOW (ref >60)
Glucose, Bld: 107 mg/dL — ABNORMAL HIGH (ref 70–99)
Potassium: 4.4 mmol/L (ref 3.5–5.1)
Sodium: 145 mmol/L (ref 135–145)
Total Bilirubin: 0.5 mg/dL (ref 0.3–1.2)
Total Protein: 6.7 g/dL (ref 6.5–8.1)

## 2019-04-19 LAB — SARS CORONAVIRUS 2 BY RT PCR (HOSPITAL ORDER, PERFORMED IN ~~LOC~~ HOSPITAL LAB): SARS Coronavirus 2: NEGATIVE

## 2019-04-19 LAB — CK: Total CK: 249 U/L — ABNORMAL HIGH (ref 38–234)

## 2019-04-19 MED ORDER — ACETAMINOPHEN 650 MG RE SUPP: 650.0000 mg | Freq: Four times a day (QID) | RECTAL | Status: DC | PRN

## 2019-04-19 MED ORDER — SENNOSIDES-DOCUSATE SODIUM 8.6-50 MG PO TABS: 1.0000 | ORAL_TABLET | Freq: Every evening | ORAL | Status: DC | PRN

## 2019-04-19 MED ORDER — WARFARIN - PHARMACIST DOSING INPATIENT: Freq: Every day | Status: DC

## 2019-04-19 MED ORDER — AMLODIPINE BESYLATE 10 MG PO TABS
10.0000 mg | ORAL_TABLET | Freq: Every day | ORAL | Status: DC
  Administered 2019-04-20 – 2019-04-25 (×5): 10 mg via ORAL
  Filled 2019-04-19 (×5): qty 1

## 2019-04-19 MED ORDER — ACETAMINOPHEN 325 MG PO TABS
650.0000 mg | ORAL_TABLET | Freq: Four times a day (QID) | ORAL | Status: DC | PRN
  Administered 2019-04-24: 650 mg via ORAL
  Filled 2019-04-19: qty 2

## 2019-04-19 MED ORDER — FEBUXOSTAT 40 MG PO TABS
40.0000 mg | ORAL_TABLET | Freq: Every day | ORAL | Status: DC
  Administered 2019-04-20 – 2019-04-28 (×6): 40 mg via ORAL
  Filled 2019-04-19 (×10): qty 1

## 2019-04-19 MED ORDER — ONDANSETRON HCL 4 MG PO TABS: 4.0000 mg | ORAL_TABLET | Freq: Four times a day (QID) | ORAL | Status: DC | PRN

## 2019-04-19 MED ORDER — ONDANSETRON HCL 4 MG/2ML IJ SOLN: 4.0000 mg | Freq: Four times a day (QID) | INTRAMUSCULAR | Status: DC | PRN

## 2019-04-19 MED ORDER — LOSARTAN POTASSIUM 50 MG PO TABS
100.0000 mg | ORAL_TABLET | Freq: Every day | ORAL | Status: DC
  Administered 2019-04-20: 100 mg via ORAL
  Filled 2019-04-19: qty 2

## 2019-04-19 MED ORDER — OXYCODONE HCL 5 MG PO TABS: 5.0000 mg | ORAL_TABLET | ORAL | Status: DC | PRN

## 2019-04-19 MED ORDER — SODIUM CHLORIDE 0.9 % IV BOLUS
1000.0000 mL | Freq: Once | INTRAVENOUS | Status: AC
  Administered 2019-04-20: 1000 mL via INTRAVENOUS

## 2019-04-19 MED ORDER — METOPROLOL SUCCINATE ER 25 MG PO TB24
37.5000 mg | ORAL_TABLET | Freq: Every day | ORAL | Status: DC
  Administered 2019-04-20 – 2019-04-22 (×3): 37.5 mg via ORAL
  Filled 2019-04-19 (×3): qty 2

## 2019-04-19 NOTE — ED Provider Notes (Addendum)
[facility]PART[facility] Provider Note   CSN: [id number] Arrival date & time: 04/19/19  1814    History   Chief Complaint Chief Complaint  Patient presents with   Fall    HPI Elizabeth Burch is a 85 y.o. female CKD, hypertension who presents after a fall from home (on warfarin).  Patient states she fell around 10 AM this morning.  Does not remember she had her head, but endorses LOC.  Was unable to get up after she did not help.  Patient lives at home alone. Is complaining of pain to her left rib cage. Denies headache or other symptoms.     HPI  Past Medical History:  Diagnosis Date   Arthritis    Gout   Cataract    Chronic kidney disease, stage IV (severe) ([place])    Diverticulosis of colon (without mention of hemorrhage) 2008   DVT (deep venous thrombosis) ([place]) 1950   External hemorrhoids without mention of complication 3762   Fibrocystic breast disease    GERD (gastroesophageal reflux disease) 2004   Glaucoma    Gout    Hiatal hernia 2004   HTN (hypertension)    Palpitations    Peripheral vascular disease ([place])     Patient Active Problem List   Diagnosis Date Noted   Multiple rib fractures involving four or more ribs 04/19/2019   Frequent falls 04/19/2019   Syncope 04/19/2019   History of DVT of lower extremity 11/13/2016   Chronic anticoagulation 11/13/2016   Statin intolerance 03/19/2015   Cardiomegaly 03/19/2015   Osteoporosis 12/24/2013   Kyphosis 11/03/2013   Acute venous embolism and thrombosis of deep vessels of distal lower extremity ([place]) 08/26/2013   Chronic venous hypertension due to DVT 08/26/2013   Fibrocystic disease of right breast 05/22/2013   DVT of leg (deep venous thrombosis) ([place]) 02/16/2013   Leg edema, left 09/16/2012   Gout    CKD (chronic kidney disease), stage IV ([place]) 01/08/2012   Multinodular thyroid 10/25/2011   Fibrocystic breast disease, left.    Open-angle  glaucoma    Hyperlipidemia 01/06/2009   Essential hypertension 01/06/2009    Past Surgical History:  Procedure Laterality Date   APPENDECTOMY     BREAST EXCISIONAL BIOPSY Bilateral 1975   CESAREAN SECTION     CYSTECTOMY     on colon   EYE SURGERY Bilateral    TONSILLECTOMY       OB History   No obstetric history on file.      Home Medications    Prior to Admission medications   Medication Sig Start Date End Date Taking? Authorizing Provider  amLODipine (NORVASC) 10 MG tablet Take 1 tablet by mouth once daily 03/20/19   Chipper Herb, MD  colchicine (COLCRYS) 0.6 MG tablet Take 1 tablet (0.6 mg total) by mouth 2 (two) times daily as needed. 02/22/15   Cherre Robins, PharmD  febuxostat (ULORIC) 40 MG tablet Take 1 tablet by mouth once daily 03/17/19   Chipper Herb, MD  furosemide (LASIX) 40 MG tablet Take 1.5 tablets (60 mg total) by mouth daily. 01/01/19   Chipper Herb, MD  loratadine (CLARITIN) 10 MG tablet Take 1 tablet (10 mg total) by mouth daily as needed for itching. 05/17/18   Ronnie Doss M, DO  losartan (COZAAR) 100 MG tablet TAKE 1 TABLET BY MOUTH ONCE DAILY 02/03/19   Chipper Herb, MD  metoprolol succinate (TOPROL-XL) 25 MG 24 hr tablet TAKE 1 & 1/2 (ONE &  ONE-HALF) TABLETS BY MOUTH ONCE DAILY 03/20/19   Chipper Herb, MD  Multiple Vitamins-Minerals (PRESERVISION AREDS 2 PO) Take 1 capsule by mouth daily.    [provider]  Omega-3 Fatty Acids (FISH OIL) 1000 MG CAPS Take 1,000 mg by mouth daily.    [provider]  warfarin (COUMADIN) 2 MG tablet TAKE 1 & 1/2 (ONE & ONE-HALF) TABLETS BY MOUTH ONCE DAILY OR AS DIRECTED BY [facility]IN CLINIC 01/20/19   Chipper Herb, MD    Family History Family History  Problem Relation Age of Onset   Cancer Mother        stomach   Cancer Sister        breast   Heart disease Brother    Heart attack Brother    Cancer Sister        breast   Cancer Sister        breast   Cirrhosis  Brother    Alcohol abuse Brother    Hyperlipidemia Brother    Hypertension Brother    Heart disease Brother    Diabetes Brother    Cancer Maternal Grandfather     Social History Social History   Tobacco Use   Smoking status: Never Smoker   Smokeless tobacco: Never Used  Substance Use Topics   Alcohol use: No   Drug use: No     Allergies   Prednisone; Vigamox [moxifloxacin]; Allopurinol; Keflex [cephalexin]; Ace inhibitors; Alendronate sodium; Ciprofloxacin; Clarithromycin; Clindamycin/lincomycin; Nitrofurantoin; Ofloxacin; Penicillins; Sulfamethoxazole; and Sulfonamide derivatives   Review of Systems Review of Systems  Constitutional: Negative for chills and fever.  HENT: Negative for ear pain and sore throat.   Eyes: Negative for pain and visual disturbance.  Respiratory: Negative for cough and shortness of breath.   Cardiovascular: Negative for chest pain and palpitations.  Gastrointestinal: Negative for abdominal pain and vomiting.  Genitourinary: Negative for dysuria and hematuria.  Musculoskeletal: Negative for arthralgias (no hip or knee pain) and back pain.       Pain to left ribcage  Skin: Negative for color change, rash and wound.  Neurological: Negative for seizures and syncope.  All other systems reviewed and are negative.    Physical Exam Updated Vital Signs BP (!) 164/63 (BP Location: Right Arm)    Pulse (!) 54    Temp 98 F (36.7 C) (Oral)    Resp 18    Ht 5\' 2"  (1.575 m)    Wt 66.4 kg    SpO2 97%    BMI 26.77 kg/m   Physical Exam Vitals signs and nursing note reviewed.  Constitutional:      General: She is not in acute distress.    Appearance: She is well-developed. She is not ill-appearing.  HENT:     Head: Normocephalic and atraumatic.  Eyes:     Conjunctiva/sclera: Conjunctivae normal.     Pupils: Pupils are equal, round, and reactive to light.  Neck:     Musculoskeletal: Neck supple. No muscular tenderness.  Cardiovascular:      Rate and Rhythm: Normal rate and regular rhythm.     Heart sounds: No murmur.  Pulmonary:     Effort: Pulmonary effort is normal. No respiratory distress.     Breath sounds: Normal breath sounds.  Chest:     Chest Spath: Tenderness (left side) present.  Abdominal:     Palpations: Abdomen is soft.     Tenderness: There is no abdominal tenderness.  Musculoskeletal: Normal range of motion.  General: No swelling, tenderness, deformity or signs of injury.  Skin:    General: Skin is warm and dry.  Neurological:     General: No focal deficit present.     Mental Status: She is alert and oriented to person, place, and time.     Cranial Nerves: No cranial nerve deficit.     Sensory: No sensory deficit.     Motor: No weakness.     Coordination: Coordination normal.      ED Treatments / Results  Labs (all labs ordered are listed, but only abnormal results are displayed) Labs Reviewed  CBC WITH DIFFERENTIAL/PLATELET - Abnormal; Notable for the following components:      Result Value   RBC 3.83 (*)    Hemoglobin 10.7 (*)    HCT 35.2 (*)    RDW 17.5 (*)    All other components within normal limits  COMPREHENSIVE METABOLIC PANEL - Abnormal; Notable for the following components:   Chloride 115 (*)    CO2 20 (*)    Glucose, Bld 107 (*)    BUN 39 (*)    Creatinine, Ser 1.79 (*)    Alkaline Phosphatase 129 (*)    GFR calc non Af Amer 25 (*)    GFR calc Af Amer 28 (*)    All other components within normal limits  CK - Abnormal; Notable for the following components:   Total CK 249 (*)    All other components within normal limits  BASIC METABOLIC PANEL - Abnormal; Notable for the following components:   Sodium 146 (*)    Chloride 117 (*)    CO2 19 (*)    BUN 34 (*)    Creatinine, Ser 1.44 (*)    GFR calc non Af Amer 32 (*)    GFR calc Af Amer 37 (*)    All other components within normal limits  CBC - Abnormal; Notable for the following components:   RBC 3.30 (*)     Hemoglobin 9.3 (*)    HCT 30.1 (*)    RDW 17.5 (*)    All other components within normal limits  PROTIME-INR - Abnormal; Notable for the following components:   Prothrombin Time 33.2 (*)    INR 3.3 (*)    All other components within normal limits  URINALYSIS, ROUTINE W REFLEX MICROSCOPIC - Abnormal; Notable for the following components:   Protein, ur 30 (*)    All other components within normal limits  SARS CORONAVIRUS 2 (HOSPITAL ORDER, [facility])  MRSA PCR SCREENING  CK    EKG EKG Interpretation  Date/Time:  Saturday Apr 19 2019 18:23:00 EDT Ventricular Rate:  66 PR Interval:    QRS Duration: 76 QT Interval:  340 QTC Calculation: 357 R Axis:   61 Text Interpretation:  Sinus rhythm Artifact Confirmed by Elnora Morrison [phone]) on 04/19/2019 6:29:02 PM   Radiology Ct Abdomen Wo Contrast  Result Date: 04/19/2019 CLINICAL DATA:  85 year old post fall this morning. Rib fractures on radiograph. EXAM: CT CHEST, ABDOMEN AND PELVIS WITHOUT CONTRAST TECHNIQUE: Multidetector CT imaging of the chest, abdomen and pelvis was performed following the standard protocol without IV contrast. COMPARISON:  Chest radiograph earlier this day. FINDINGS: CT CHEST FINDINGS Cardiovascular: Aortic atherosclerosis. No periaortic stranding to suggest injury. Mild cardiomegaly. There are coronary artery calcifications. No pericardial effusion. Mediastinum/Nodes: Mediastinal hemorrhage or hematoma. No pneumomediastinum. The esophagus is decompressed. Slightly heterogeneous thyroid gland without dominant nodule. Evidence of mediastinal adenopathy, lack of IV  contrast limits detailed assessment. Lungs/Pleura: No pneumothorax. Evidence pulmonary contusion. Trace left pleural thickening/effusion and atelectasis the lower lobe. No right pleural fluid. Linear atelectasis or scarring in the anterior right upper lobe and right middle lobe. Calcified granuloma in the right middle lobe. No  pulmonary edema. Musculoskeletal: Acute anterolateral minimally displaced left fifth through ninth rib fractures. Remote sternal fracture. Remote T6 compression fracture. Included shoulder girdles and clavicles are intact. No confluent chest Killman contusion. CT ABDOMEN PELVIS FINDINGS Hepatobiliary: Lack of IV contrast limits assessment for injury. Allowing for this, no evidence of hepatic injury. 11 mm simple cyst in the left lobe of the liver. No perihepatic hematoma. Gallbladder is unremarkable. Pancreas: No evidence of injury. No ductal dilatation or inflammation. Spleen: Lack of IV contrast limits detailed assessment. Allowing for this, no evidence of injury. No perisplenic hematoma. Adrenals/Urinary Tract: No adrenal hemorrhage or renal injury identified. Bilateral renal parenchymal thinning. Cortical scarring in the upper right kidney. Bladder is unremarkable. Stomach/Bowel: Small hiatal hernia. Stomach nondistended. No bowel Tokarz thickening or inflammatory change. No evidence of bowel injury. No mesenteric hematoma. Sutures at the base of the cecum from presumed appendectomy. Distal colonic diverticulosis without diverticulitis. Vascular/Lymphatic: Aortic atherosclerosis without aneurysm. No retroperitoneal fluid or stranding. No adenopathy. Reproductive: Status post hysterectomy. No adnexal masses. Other: No free fluid or free air. No confluent body Martha contusion. Musculoskeletal: No fracture of the pelvis or lumbar spine. IMPRESSION: 1. Acute anterolateral minimally displaced left fifth through ninth rib fractures. No pneumothorax. Trace left pleural effusion/pleural thickening. 2. No additional acute traumatic injury to the chest, abdomen, or pelvis. 3. Remote sternal fracture.  Remote T6 compression fracture. 4. Coronary artery calcifications. Aortic Atherosclerosis (ICD10-I70.0). Electronically Signed   By: Keith Rake M.D.   On: 04/19/2019 21:20   Dg Chest 1 View  Result Date:  04/19/2019 CLINICAL DATA:  Fall EXAM: CHEST  1 VIEW COMPARISON:  12/29/2016 FINDINGS: Cardiomegaly. Both lungs are clear. Multiple minimally displaced fractures of the left ribs. IMPRESSION: 1. Multiple minimally displaced fractures of the left ribs. No pneumothorax or pleural effusion is appreciated. 2.  Cardiomegaly. Electronically Signed   By: Eddie Candle M.D.   On: 04/19/2019 19:38   Dg Pelvis 1-2 Views  Result Date: 04/19/2019 CLINICAL DATA:  Fall EXAM: PELVIS - 1-2 VIEW COMPARISON:  None. FINDINGS: Marked osteopenia. No obvious displaced fracture of the pelvis or bilateral proximal femurs. IMPRESSION: Marked osteopenia. No obvious displaced fracture of the pelvis or bilateral proximal femurs. Please note that plain radiographs are significantly limited for hip and pelvic fracture in the setting of osteopenia; recommend MRI to more sensitively evaluate for marrow edema and fracture if suspected. Electronically Signed   By: Eddie Candle M.D.   On: 04/19/2019 19:40   Ct Head Wo Contrast  Result Date: 04/19/2019 CLINICAL DATA:  Unwitnessed fall. EXAM: CT HEAD WITHOUT CONTRAST CT CERVICAL SPINE WITHOUT CONTRAST TECHNIQUE: Multidetector CT imaging of the head and cervical spine was performed following the standard protocol without intravenous contrast. Multiplanar CT image reconstructions of the cervical spine were also generated. COMPARISON:  None. FINDINGS: CT HEAD FINDINGS Brain: No evidence of acute infarction, hemorrhage, hydrocephalus, extra-axial collection or mass lesion/mass effect. There is mild diffuse low-attenuation within the subcortical and periventricular white matter compatible with chronic microvascular disease. Vascular: No hyperdense vessel or unexpected calcification. Skull: Normal. Negative for fracture or focal lesion. Sinuses/Orbits: Mild mucosal thickening involves the maxillary sinuses. Other: None. CT CERVICAL SPINE FINDINGS Alignment: Normal alignment of the cervical spine. Skull  base and vertebrae: No acute fracture. No primary bone lesion or focal pathologic process. Soft tissues and spinal canal: No prevertebral fluid or swelling. No visible canal hematoma. Disc levels: Mild multi level ventral endplate spurring noted. Disc spaces are relatively well preserved. Upper chest: Negative. Other: None IMPRESSION: 1. No acute intracranial abnormalities. 2. Mild chronic small vessel ischemic change. 3. No evidence for cervical spine fracture or dislocation. Electronically Signed   By: Kerby Moors M.D.   On: 04/19/2019 19:25   Ct Chest Wo Contrast  Result Date: 04/19/2019 CLINICAL DATA:  85 year old post fall this morning. Rib fractures on radiograph. EXAM: CT CHEST, ABDOMEN AND PELVIS WITHOUT CONTRAST TECHNIQUE: Multidetector CT imaging of the chest, abdomen and pelvis was performed following the standard protocol without IV contrast. COMPARISON:  Chest radiograph earlier this day. FINDINGS: CT CHEST FINDINGS Cardiovascular: Aortic atherosclerosis. No periaortic stranding to suggest injury. Mild cardiomegaly. There are coronary artery calcifications. No pericardial effusion. Mediastinum/Nodes: Mediastinal hemorrhage or hematoma. No pneumomediastinum. The esophagus is decompressed. Slightly heterogeneous thyroid gland without dominant nodule. Evidence of mediastinal adenopathy, lack of IV contrast limits detailed assessment. Lungs/Pleura: No pneumothorax. Evidence pulmonary contusion. Trace left pleural thickening/effusion and atelectasis the lower lobe. No right pleural fluid. Linear atelectasis or scarring in the anterior right upper lobe and right middle lobe. Calcified granuloma in the right middle lobe. No pulmonary edema. Musculoskeletal: Acute anterolateral minimally displaced left fifth through ninth rib fractures. Remote sternal fracture. Remote T6 compression fracture. Included shoulder girdles and clavicles are intact. No confluent chest Brougham contusion. CT ABDOMEN PELVIS FINDINGS  Hepatobiliary: Lack of IV contrast limits assessment for injury. Allowing for this, no evidence of hepatic injury. 11 mm simple cyst in the left lobe of the liver. No perihepatic hematoma. Gallbladder is unremarkable. Pancreas: No evidence of injury. No ductal dilatation or inflammation. Spleen: Lack of IV contrast limits detailed assessment. Allowing for this, no evidence of injury. No perisplenic hematoma. Adrenals/Urinary Tract: No adrenal hemorrhage or renal injury identified. Bilateral renal parenchymal thinning. Cortical scarring in the upper right kidney. Bladder is unremarkable. Stomach/Bowel: Small hiatal hernia. Stomach nondistended. No bowel Redmon thickening or inflammatory change. No evidence of bowel injury. No mesenteric hematoma. Sutures at the base of the cecum from presumed appendectomy. Distal colonic diverticulosis without diverticulitis. Vascular/Lymphatic: Aortic atherosclerosis without aneurysm. No retroperitoneal fluid or stranding. No adenopathy. Reproductive: Status post hysterectomy. No adnexal masses. Other: No free fluid or free air. No confluent body Vierra contusion. Musculoskeletal: No fracture of the pelvis or lumbar spine. IMPRESSION: 1. Acute anterolateral minimally displaced left fifth through ninth rib fractures. No pneumothorax. Trace left pleural effusion/pleural thickening. 2. No additional acute traumatic injury to the chest, abdomen, or pelvis. 3. Remote sternal fracture.  Remote T6 compression fracture. 4. Coronary artery calcifications. Aortic Atherosclerosis (ICD10-I70.0). Electronically Signed   By: Keith Rake M.D.   On: 04/19/2019 21:20   Ct Cervical Spine Wo Contrast  Result Date: 04/19/2019 CLINICAL DATA:  Unwitnessed fall. EXAM: CT HEAD WITHOUT CONTRAST CT CERVICAL SPINE WITHOUT CONTRAST TECHNIQUE: Multidetector CT imaging of the head and cervical spine was performed following the standard protocol without intravenous contrast. Multiplanar CT image  reconstructions of the cervical spine were also generated. COMPARISON:  None. FINDINGS: CT HEAD FINDINGS Brain: No evidence of acute infarction, hemorrhage, hydrocephalus, extra-axial collection or mass lesion/mass effect. There is mild diffuse low-attenuation within the subcortical and periventricular white matter compatible with chronic microvascular disease. Vascular: No hyperdense vessel or unexpected calcification. Skull: Normal. Negative for fracture or  focal lesion. Sinuses/Orbits: Mild mucosal thickening involves the maxillary sinuses. Other: None. CT CERVICAL SPINE FINDINGS Alignment: Normal alignment of the cervical spine. Skull base and vertebrae: No acute fracture. No primary bone lesion or focal pathologic process. Soft tissues and spinal canal: No prevertebral fluid or swelling. No visible canal hematoma. Disc levels: Mild multi level ventral endplate spurring noted. Disc spaces are relatively well preserved. Upper chest: Negative. Other: None IMPRESSION: 1. No acute intracranial abnormalities. 2. Mild chronic small vessel ischemic change. 3. No evidence for cervical spine fracture or dislocation. Electronically Signed   By: Kerby Moors M.D.   On: 04/19/2019 19:25    Procedures Procedures (including critical care time)  Medications Ordered in ED Medications  acetaminophen (TYLENOL) tablet 650 mg (has no administration in time range)    Or  acetaminophen (TYLENOL) suppository 650 mg (has no administration in time range)  oxyCODONE (Oxy IR/ROXICODONE) immediate release tablet 5 mg (has no administration in time range)  senna-docusate (Senokot-S) tablet 1 tablet (has no administration in time range)  ondansetron (ZOFRAN) tablet 4 mg (has no administration in time range)    Or  ondansetron (ZOFRAN) injection 4 mg (has no administration in time range)  febuxostat (ULORIC) tablet 40 mg (40 mg Oral Given 04/20/19 0935)  amLODipine (NORVASC) tablet 10 mg (10 mg Oral Given 04/20/19 0930)    metoprolol succinate (TOPROL-XL) 24 hr tablet 37.5 mg (37.5 mg Oral Given 04/20/19 0930)  polyethylene glycol (MIRALAX / GLYCOLAX) packet 17 g (17 g Oral Given 04/20/19 0929)  losartan (COZAAR) tablet 50 mg (has no administration in time range)  sodium chloride 0.9 % bolus 1,000 mL (1,000 mLs Intravenous New Bag/Given 04/20/19 0026)  phytonadione (VITAMIN K) tablet 5 mg (5 mg Oral Given 04/20/19 0830)     Initial Impression / Assessment and Plan / ED Course  I have reviewed the triage vital signs and the nursing notes.  Pertinent labs & imaging results that were available during my care of the patient were reviewed by me and considered in my medical decision making (see chart for details).        Patient presents 8 hours after a fall from home.  Patient endorses LOC to fall.  Was able to get up after falling due to living at home alone.   On initial exam patient well appearing, not in acute distress. VSS except mild hypertension. Physical exam as above  We will obtain CT head, CT cervical spine, CT CAP,chest x-ray, pelvis x-ray and labs including UA.   Imaging negative except for multiple left rib fractures. Patient at high risk for complications given her age and the fact that she is on warfarin.  Trauma consulted. Patient will be admitted to medicine for pain control.  Final Clinical Impressions(s) / ED Diagnoses   Final diagnoses:  Closed fracture of multiple ribs of left side, initial encounter    ED Discharge Orders    None       Doneta Public, MD 04/20/19 1238    Doneta Public, MD 04/20/19 1240    Elnora Morrison, MD 04/21/19 [phone]

## 2019-04-19 NOTE — Progress Notes (Signed)
ANTICOAGULATION CONSULT NOTE - Initial Consult  Pharmacy Consult for Coumadin Indication: h/o DVT  Allergies  Allergen Reactions  . Prednisone Hypertension  . Vigamox [Moxifloxacin] Nausea And Vomiting  . Allopurinol   . Keflex [Cephalexin]     Rash   . Ace Inhibitors Other (See Comments)    unknown  . Alendronate Sodium Rash  . Ciprofloxacin Rash  . Clarithromycin Rash  . Clindamycin/Lincomycin Rash  . Nitrofurantoin Rash  . Ofloxacin Rash  . Penicillins Rash  . Sulfamethoxazole Rash  . Sulfonamide Derivatives Rash    Patient Measurements: Height: 5\' 2"  (157.5 cm) Weight: 146 lb 6.2 oz (66.4 kg) IBW/kg (Calculated) : 50.1  Vital Signs: Temp: 97.4 F (36.3 C) (05/16 2343) Temp Source: Oral (05/16 2343) BP: 164/57 (05/16 2343) Pulse Rate: 67 (05/16 2343)  Labs: Recent Labs    04/19/19 1830  HGB 10.7*  HCT 35.2*  PLT 224  CREATININE 1.79*  CKTOTAL 249*    Estimated Creatinine Clearance: 18.7 mL/min (A) (by C-G formula based on SCr of 1.79 mg/dL (H)).   Medical History: Past Medical History:  Diagnosis Date  . Arthritis    Gout  . Cataract   . Chronic kidney disease, stage IV (severe) ([place])   . Diverticulosis of colon (without mention of hemorrhage) 2008  . DVT (deep venous thrombosis) ([place]) 1950  . External hemorrhoids without mention of complication 4270  . Fibrocystic breast disease   . GERD (gastroesophageal reflux disease) 2004  . Glaucoma   . Gout   . Hiatal hernia 2004  . HTN (hypertension)   . Palpitations   . Peripheral vascular disease ([facility])     Medications:  Medications Prior to Admission  Medication Sig Dispense Refill Last Dose  . amLODipine (NORVASC) 10 MG tablet Take 1 tablet by mouth once daily 90 tablet 0   . colchicine (COLCRYS) 0.6 MG tablet Take 1 tablet (0.6 mg total) by mouth 2 (two) times daily as needed. 60 tablet 0 Taking  . febuxostat (ULORIC) 40 MG tablet Take 1 tablet by mouth once daily 30 tablet 4   . furosemide  (LASIX) 40 MG tablet Take 1.5 tablets (60 mg total) by mouth daily. 45 tablet 3 Taking  . loratadine (CLARITIN) 10 MG tablet Take 1 tablet (10 mg total) by mouth daily as needed for itching. 15 tablet 0 Taking  . losartan (COZAAR) 100 MG tablet TAKE 1 TABLET BY MOUTH ONCE DAILY 90 tablet 0 Taking  . metoprolol succinate (TOPROL-XL) 25 MG 24 hr tablet TAKE 1 & 1/2 (ONE & ONE-HALF) TABLETS BY MOUTH ONCE DAILY 135 tablet 0   . Multiple Vitamins-Minerals (PRESERVISION AREDS 2 PO) Take 1 capsule by mouth daily.   Taking  . Omega-3 Fatty Acids (FISH OIL) 1000 MG CAPS Take 1,000 mg by mouth daily.   Taking  . warfarin (COUMADIN) 2 MG tablet TAKE 1 & 1/2 (ONE & ONE-HALF) TABLETS BY MOUTH ONCE DAILY OR AS DIRECTED BY COUMADIN CLINIC 135 tablet 0 Taking   Scheduled:  . [START ON 04/20/2019] amLODipine  10 mg Oral Daily  . [START ON 04/20/2019] febuxostat  40 mg Oral Daily  . [START ON 04/20/2019] losartan  100 mg Oral Daily  . [START ON 04/20/2019] metoprolol succinate  37.5 mg Oral Daily  . [START ON 04/20/2019] Warfarin - Pharmacist Dosing Inpatient   Does not apply q1800   Infusions:  . sodium chloride      Assessment/Plan: 85yo female presents after fall at home, found to have rib  fractures, further interview reveals multiple falls over the past week >> admission for pain management and further evaluation, to continue Coumadin; INR not drawn and pt unable to give med history so will hold off on dosing Coumadin for now and check INR with am labs; of note pt had AC visit just a few days ago with therapeutic INR, regular dose is 2mg  daily.  Wynona Neat, PharmD, BCPS  04/19/2019,11:56 PM

## 2019-04-19 NOTE — ED Triage Notes (Signed)
Per [facility]- pt had a fall this morning, maybe around 10am. She remembers eating breakfast and walking into the living room where she fell, unknown if it was mechanical or syncopal. Pt lives alone. Pt family- STEVE son 36 [place] brother [phone]. Pt has pain to left rib cage. EKG normal with EMS.

## 2019-04-19 NOTE — ED Notes (Signed)
Patient transported to CT 

## 2019-04-19 NOTE — ED Notes (Signed)
Report given to 4E. All questions answered

## 2019-04-19 NOTE — Consult Note (Signed)
Reason for Consult: Fall, rib fractures Referring Physician: Dr. San Morelle is an 85 y.o. female.  HPI: Patient is a 85 year old female with a history of chronic kidney disease, history of DVT on Coumadin, hypertension, PVD, gout, who comes in after a fall today.  Patient told EDP she fell at 10 AM this morning.  Patient states that she fell 3 days ago on an escalator.  Patient denies to me any LOC however did states she did have LOC when she spoke with the EDP.  Her main complaint is of left chest pain.  She also complains of lower back pain.  She states that she did not have any back pain prior to coming to the ER. Upon evaluation the ER patient underwent chest x-ray which revealed left-sided rib fractures.  There is no pneumothorax seen on chest x-ray.  EDP did state that in speaking with family she has had multiple falls over the last week.  Patient does states she is current on her Coumadin secondary to history of DVTs.  Past Medical History:  Diagnosis Date   Arthritis    Gout   Cataract    Chronic kidney disease, stage IV (severe) ([place])    Diverticulosis of colon (without mention of hemorrhage) 2008   DVT (deep venous thrombosis) ([place]) 1950   External hemorrhoids without mention of complication 1610   Fibrocystic breast disease    GERD (gastroesophageal reflux disease) 2004   Glaucoma    Gout    Hiatal hernia 2004   HTN (hypertension)    Palpitations    Peripheral vascular disease (H[facility])     Past Surgical History:  Procedure Laterality Date   APPENDECTOMY     BREAST EXCISIONAL BIOPSY Bilateral 1975   CESAREAN SECTION     CYSTECTOMY     on colon   EYE SURGERY Bilateral    TONSILLECTOMY      Family History  Problem Relation Age of Onset   Cancer Mother        stomach   Cancer Sister        breast   Heart disease Brother    Heart attack Brother    Cancer Sister        breast   Cancer Sister        breast   Cirrhosis  Brother    Alcohol abuse Brother    Hyperlipidemia Brother    Hypertension Brother    Heart disease Brother    Diabetes Brother    Cancer Maternal Grandfather     Social History:  reports that she has never smoked. She has never used smokeless tobacco. She reports that she does not drink alcohol or use drugs.  Allergies:  Allergies  Allergen Reactions   Prednisone Hypertension   Vigamox [Moxifloxacin] Nausea And Vomiting   Allopurinol    Keflex [Cephalexin]     Rash    Ace Inhibitors Other (See Comments)    unknown   Alendronate Sodium Rash   Ciprofloxacin Rash   Clarithromycin Rash   Clindamycin/Lincomycin Rash   Nitrofurantoin Rash   Ofloxacin Rash   Penicillins Rash   Sulfamethoxazole Rash   Sulfonamide Derivatives Rash    Medications: I have reviewed the patient's current medications.  Results for orders placed or performed during the hospital encounter of 04/19/19 (from the past 48 hour(s))  CBC with Differential     Status: Abnormal   Collection Time: 04/19/19  6:30 PM  Result Value Ref Range   WBC  8.6 4.0 - 10.5 K/uL   RBC 3.83 (L) 3.87 - 5.11 MIL/uL   Hemoglobin 10.7 (L) 12.0 - 15.0 g/dL   HCT 35.2 (L) 36.0 - 46.0 %   MCV 91.9 80.0 - 100.0 fL   MCH 27.9 26.0 - 34.0 pg   MCHC 30.4 30.0 - 36.0 g/dL   RDW 17.5 (H) 11.5 - 15.5 %   Platelets 224 150 - 400 K/uL   nRBC 0.0 0.0 - 0.2 %   Neutrophils Relative % 75 %   Neutro Abs 6.4 1.7 - 7.7 K/uL   Lymphocytes Relative 14 %   Lymphs Abs 1.2 0.7 - 4.0 K/uL   Monocytes Relative 11 %   Monocytes Absolute 1.0 0.1 - 1.0 K/uL   Eosinophils Relative 0 %   Eosinophils Absolute 0.0 0.0 - 0.5 K/uL   Basophils Relative 0 %   Basophils Absolute 0.0 0.0 - 0.1 K/uL   Immature Granulocytes 0 %   Abs Immature Granulocytes 0.03 0.00 - 0.07 K/uL    Comment: Performed at [facility] [address], [place], [place] [address]  Comprehensive metabolic panel     Status: Abnormal   Collection Time:  04/19/19  6:30 PM  Result Value Ref Range   Sodium 145 135 - 145 mmol/L   Potassium 4.4 3.5 - 5.1 mmol/L   Chloride 115 (H) 98 - 111 mmol/L   CO2 20 (L) 22 - 32 mmol/L   Glucose, Bld 107 (H) 70 - 99 mg/dL   BUN 39 (H) 8 - 23 mg/dL   Creatinine, Ser 1.79 (H) 0.44 - 1.00 mg/dL   Calcium 10.2 8.9 - 10.3 mg/dL   Total Protein 6.7 6.5 - 8.1 g/dL   Albumin 3.5 3.5 - 5.0 g/dL   AST 27 15 - 41 U/L   ALT 27 0 - 44 U/L   Alkaline Phosphatase 129 (H) 38 - 126 U/L   Total Bilirubin 0.5 0.3 - 1.2 mg/dL   GFR calc non Af Amer 25 (L) >60 mL/min   GFR calc Af Amer 28 (L) >60 mL/min   Anion gap 10 5 - 15    Comment: Performed at [facility], [place] [address] [place], [place] [address]  CK     Status: Abnormal   Collection Time: 04/19/19  6:30 PM  Result Value Ref Range   Total CK 249 (H) 38 - 234 U/L    Comment: Performed at [facility], [place] [address], [place], [place] [address]    Dg Chest 1 View  Result Date: 04/19/2019 CLINICAL DATA:  Fall EXAM: CHEST  1 VIEW COMPARISON:  12/29/2016 FINDINGS: Cardiomegaly. Both lungs are clear. Multiple minimally displaced fractures of the left ribs. IMPRESSION: 1. Multiple minimally displaced fractures of the left ribs. No pneumothorax or pleural effusion is appreciated. 2.  Cardiomegaly. Electronically Signed   By: Eddie Candle M.D.   On: 04/19/2019 19:38   Dg Pelvis 1-2 Views  Result Date: 04/19/2019 CLINICAL DATA:  Fall EXAM: PELVIS - 1-2 VIEW COMPARISON:  None. FINDINGS: Marked osteopenia. No obvious displaced fracture of the pelvis or bilateral proximal femurs. IMPRESSION: Marked osteopenia. No obvious displaced fracture of the pelvis or bilateral proximal femurs. Please note that plain radiographs are significantly limited for hip and pelvic fracture in the setting of osteopenia; recommend MRI to more sensitively evaluate for marrow edema and fracture if suspected. Electronically Signed   By: Eddie Candle M.D.   On: 04/19/2019 19:40   Ct  Head Wo Contrast  Result Date: 04/19/2019 CLINICAL DATA:  Unwitnessed fall. EXAM: CT HEAD WITHOUT CONTRAST CT CERVICAL SPINE WITHOUT CONTRAST TECHNIQUE: Multidetector CT imaging of the head and cervical spine was performed following the standard protocol without intravenous contrast. Multiplanar CT image reconstructions of the cervical spine were also generated. COMPARISON:  None. FINDINGS: CT HEAD FINDINGS Brain: No evidence of acute infarction, hemorrhage, hydrocephalus, extra-axial collection or mass lesion/mass effect. There is mild diffuse low-attenuation within the subcortical and periventricular white matter compatible with chronic microvascular disease. Vascular: No hyperdense vessel or unexpected calcification. Skull: Normal. Negative for fracture or focal lesion. Sinuses/Orbits: Mild mucosal thickening involves the maxillary sinuses. Other: None. CT CERVICAL SPINE FINDINGS Alignment: Normal alignment of the cervical spine. Skull base and vertebrae: No acute fracture. No primary bone lesion or focal pathologic process. Soft tissues and spinal canal: No prevertebral fluid or swelling. No visible canal hematoma. Disc levels: Mild multi level ventral endplate spurring noted. Disc spaces are relatively well preserved. Upper chest: Negative. Other: None IMPRESSION: 1. No acute intracranial abnormalities. 2. Mild chronic small vessel ischemic change. 3. No evidence for cervical spine fracture or dislocation. Electronically Signed   By: Kerby Moors M.D.   On: 04/19/2019 19:25   Ct Cervical Spine Wo Contrast  Result Date: 04/19/2019 CLINICAL DATA:  Unwitnessed fall. EXAM: CT HEAD WITHOUT CONTRAST CT CERVICAL SPINE WITHOUT CONTRAST TECHNIQUE: Multidetector CT imaging of the head and cervical spine was performed following the standard protocol without intravenous contrast. Multiplanar CT image reconstructions of the cervical spine were also generated. COMPARISON:  None. FINDINGS: CT HEAD FINDINGS Brain: No  evidence of acute infarction, hemorrhage, hydrocephalus, extra-axial collection or mass lesion/mass effect. There is mild diffuse low-attenuation within the subcortical and periventricular white matter compatible with chronic microvascular disease. Vascular: No hyperdense vessel or unexpected calcification. Skull: Normal. Negative for fracture or focal lesion. Sinuses/Orbits: Mild mucosal thickening involves the maxillary sinuses. Other: None. CT CERVICAL SPINE FINDINGS Alignment: Normal alignment of the cervical spine. Skull base and vertebrae: No acute fracture. No primary bone lesion or focal pathologic process. Soft tissues and spinal canal: No prevertebral fluid or swelling. No visible canal hematoma. Disc levels: Mild multi level ventral endplate spurring noted. Disc spaces are relatively well preserved. Upper chest: Negative. Other: None IMPRESSION: 1. No acute intracranial abnormalities. 2. Mild chronic small vessel ischemic change. 3. No evidence for cervical spine fracture or dislocation. Electronically Signed   By: Kerby Moors M.D.   On: 04/19/2019 19:25    Review of Systems  Constitutional: Negative for weight loss.  HENT: Negative for ear discharge, ear pain, hearing loss and tinnitus.   Eyes: Negative for blurred vision, double vision, photophobia and pain.  Respiratory: Negative for cough, sputum production and shortness of breath.   Cardiovascular: Positive for chest pain (L chest pain).  Gastrointestinal: Negative for abdominal pain, nausea and vomiting.  Genitourinary: Negative for dysuria, flank pain, frequency and urgency.  Musculoskeletal: Negative for back pain, falls, joint pain, myalgias and neck pain.  Neurological: Negative for dizziness, tingling, sensory change, focal weakness, loss of consciousness and headaches.  Endo/Heme/Allergies: Does not bruise/bleed easily.  Psychiatric/Behavioral: Negative for depression, memory loss and substance abuse. The patient is not  nervous/anxious.   All other systems reviewed and are negative.  Blood pressure (!) 162/87, pulse 70, temperature (!) 97.4 F (36.3 C), temperature source Oral, resp. rate 17, height 5\' 2"  (1.575 m), weight 68 kg, SpO2 97 %. Physical Exam  Vitals reviewed. Constitutional: She is  oriented to person, place, and time. She appears well-developed and well-nourished. She is cooperative. No distress. Cervical collar and nasal cannula in place.  HENT:  Head: Normocephalic and atraumatic. Head is without raccoon's eyes, without Battle's sign, without abrasion, without contusion and without laceration.  Right Ear: Hearing, tympanic membrane, external ear and ear canal normal. No lacerations. No drainage or tenderness. No foreign bodies. Tympanic membrane is not perforated. No hemotympanum.  Left Ear: Hearing, tympanic membrane, external ear and ear canal normal. No lacerations. No drainage or tenderness. No foreign bodies. Tympanic membrane is not perforated. No hemotympanum.  Nose: Nose normal. No nose lacerations, sinus tenderness, nasal deformity or nasal septal hematoma. No epistaxis.  Mouth/Throat: Uvula is midline, oropharynx is clear and moist and mucous membranes are normal. No lacerations.  Eyes: Pupils are equal, round, and reactive to light. Conjunctivae, EOM and lids are normal. No scleral icterus.  Neck: Trachea normal. No JVD present. No spinous process tenderness and no muscular tenderness present. Carotid bruit is not present. No thyromegaly present.  Cardiovascular: Normal rate, regular rhythm, normal heart sounds, intact distal pulses and normal pulses.  Respiratory: Effort normal and breath sounds normal. No respiratory distress. She exhibits tenderness (L chest). She exhibits no bony tenderness, no laceration and no crepitus.  GI: Soft. Normal appearance. She exhibits no distension. Bowel sounds are decreased. There is no abdominal tenderness. There is no rigidity, no rebound, no guarding  and no CVA tenderness.  Musculoskeletal:        General: No tenderness or edema.     Left shoulder: She exhibits decreased range of motion (d/t pain of left chest).     Comments: No ttp to spine with palpation   Lymphadenopathy:    She has no cervical adenopathy.  Neurological: She is alert and oriented to person, place, and time. She has normal strength. No cranial nerve deficit or sensory deficit. GCS eye subscore is 4. GCS verbal subscore is 5. GCS motor subscore is 6.  Skin: Skin is warm, dry and intact. She is not diaphoretic.     Psychiatric: She has a normal mood and affect. Her speech is normal and behavior is normal.    Assessment/Plan: 85 year old female status post fall with left multiple rib fractures Chronic kidney disease History of DVT on Coumadin Hypertension Gout  1.  Would recommend medical admission secondary to multiple medical issues. 2.  We will continue to follow the patient for pain management of rib fractures and pulm toilet. Would recommend CT scan to fully evaluate for severity of rib fractures and possible pneumothorax as patient apparently has had multiple falls over the last week according to EDP.  Ralene Ok 04/19/2019, 8:45 PM

## 2019-04-19 NOTE — H&P (Signed)
History and Physical    Elizabeth Burch ZTI:[id number] DOB: [birth year] DOA: 04/19/2019  PCP: Chipper Herb, MD  Patient coming from: Home  I have personally briefly reviewed patient's old medical records in [place]  Chief Complaint: Fall  HPI: Elizabeth Burch is a 85 y.o. female with medical history significant for hypertension, CKD stage IV, history of DVT on Coumadin, and gout who presents to the ED after a fall at home.  Patient lives alone.  She says she was standing in the kitchen around 10 AM when she suddenly fell to the floor and does not recall the events surrounding this fall, just that she thinks she passed out.    She remembers waking up and staying on the floor for prolonged period of time she was unable to get herself back up.  She denies any injury to her head but reports left lateral chest Colee pain.  She denies any nausea, vomiting, abdominal pain, diarrhea, constipation, dysuria.    She is on Coumadin for history of DVT and denies any obvious bleeding.  She denies any history of seizure-like activity.  Her sister found her in the house after an unspecified period of time and called EMS to bring her to the ED. She says she normally ambulates without the use of assistive devices.  ED Course:  Initial vitals show BP 165/61, pulse 67, RR 17, temp 97.4 Fahrenheit, SPO2 99% on room air.  Labs are notable for BUN 39, creatinine 1.79, GFR 25 (all near her recent baseline), WBC 8.6, hemoglobin 10.7, platelets 224,000, CK 249, SARS-CoV-2 test negative.  CT head and cervical spine without contrast were without acute intracranial abnormalities or evidence for cervical spine fracture or dislocation.  Portable chest x-ray showed multiple minimally displaced fractures of the left ribs without evidence of pneumothorax or pleural effusion.  X-ray of the pelvis showed osteopenia without obvious displaced fracture of the pelvis or proximal femurs.  Trauma surgery were  consulted and recommended further CT imaging and medical admission.  CT chest and abdomen without contrast showed acute anterolateral minimally displaced left fifth through ninth rib fractures without pneumothorax and no additional acute traumatic injury.  The hospitalist service was consulted to admit for further evaluation management.  Review of Systems: All systems reviewed and are negative except as documented in history of present illness above.   Past Medical History:  Diagnosis Date   Arthritis    Gout   Cataract    Chronic kidney disease, stage IV (severe) ([place])    Diverticulosis of colon (without mention of hemorrhage) 2008   DVT (deep venous thrombosis) ([place]) 1950   External hemorrhoids without mention of complication 8250   Fibrocystic breast disease    GERD (gastroesophageal reflux disease) 2004   Glaucoma    Gout    Hiatal hernia 2004   HTN (hypertension)    Palpitations    Peripheral vascular disease ([place])     Past Surgical History:  Procedure Laterality Date   APPENDECTOMY     BREAST EXCISIONAL BIOPSY Bilateral 1975   CESAREAN SECTION     CYSTECTOMY     on colon   EYE SURGERY Bilateral    TONSILLECTOMY      Social History:  reports that she has never smoked. She has never used smokeless tobacco. She reports that she does not drink alcohol or use drugs.  Allergies  Allergen Reactions   Prednisone Hypertension   Vigamox [Moxifloxacin] Nausea And Vomiting   Allopurinol  Keflex [Cephalexin]     Rash    Ace Inhibitors Other (See Comments)    unknown   Alendronate Sodium Rash   Ciprofloxacin Rash   Clarithromycin Rash   Clindamycin/Lincomycin Rash   Nitrofurantoin Rash   Ofloxacin Rash   Penicillins Rash   Sulfamethoxazole Rash   Sulfonamide Derivatives Rash    Family History  Problem Relation Age of Onset   Cancer Mother        stomach   Cancer Sister        breast   Heart disease Brother    Heart  attack Brother    Cancer Sister        breast   Cancer Sister        breast   Cirrhosis Brother    Alcohol abuse Brother    Hyperlipidemia Brother    Hypertension Brother    Heart disease Brother    Diabetes Brother    Cancer Maternal Grandfather      Prior to Admission medications   Medication Sig Start Date End Date Taking? Authorizing Provider  amLODipine (NORVASC) 10 MG tablet Take 1 tablet by mouth once daily 03/20/19   Chipper Herb, MD  colchicine (COLCRYS) 0.6 MG tablet Take 1 tablet (0.6 mg total) by mouth 2 (two) times daily as needed. 02/22/15   Cherre Robins, PharmD  febuxostat (ULORIC) 40 MG tablet Take 1 tablet by mouth once daily 03/17/19   Chipper Herb, MD  furosemide (LASIX) 40 MG tablet Take 1.5 tablets (60 mg total) by mouth daily. 01/01/19   Chipper Herb, MD  loratadine (CLARITIN) 10 MG tablet Take 1 tablet (10 mg total) by mouth daily as needed for itching. 05/17/18   Elizabeth Doss M, DO  losartan (COZAAR) 100 MG tablet TAKE 1 TABLET BY MOUTH ONCE DAILY 02/03/19   Chipper Herb, MD  metoprolol succinate (TOPROL-XL) 25 MG 24 hr tablet TAKE 1 & 1/2 (ONE & ONE-HALF) TABLETS BY MOUTH ONCE DAILY 03/20/19   Chipper Herb, MD  Multiple Vitamins-Minerals (PRESERVISION AREDS 2 PO) Take 1 capsule by mouth daily.    [provider]  Omega-3 Fatty Acids (FISH OIL) 1000 MG CAPS Take 1,000 mg by mouth daily.    [provider]  warfarin (COUMADIN) 2 MG tablet TAKE 1 & 1/2 (ONE & ONE-HALF) TABLETS BY MOUTH ONCE DAILY OR AS DIRECTED BY COUMADIN CLINIC 01/20/19   Chipper Herb, MD    Physical Exam: Vitals:   04/19/19 2130 04/19/19 2145 04/19/19 2200 04/19/19 2245  BP: (!) 149/57 (!) 156/51 (!) 156/58 (!) 174/55  Pulse: 64 68 67 67  Resp:    18  Temp:      TempSrc:      SpO2: 98% 98% 98% 97%  Weight:      Height:        Constitutional: Elderly woman resting supine in bed, NAD, calm, comfortable Eyes: PERRL, EOMI, lids and conjunctivae  normal ENMT: Mucous membranes are moist. Posterior pharynx clear of any exudate or lesions.Normal dentition.  Neck: normal, supple, no masses. Respiratory: clear to auscultation anteriorly, no wheezing, no crackles. Normal respiratory effort. No accessory muscle use.  Cardiovascular: Regular rate and rhythm, no murmurs / rubs / gallops. No extremity edema. 2+ pedal pulses. Abdomen: Mild right upper quadrant tenderness, no masses palpated. No hepatosplenomegaly. Bowel sounds positive.  Musculoskeletal: Tender to palpation left upper chest Reinhardt.  Range of motion of right upper extremitie intact, slightly diminished left upper extremity due  to left chest Flater pain.  Range of motion of both lower extremities is decreased due to chest Mizer pain. Skin: no rashes, lesions, ulcers. No induration Neurologic: CN 2-12 grossly intact. Sensation intact, Strength 5/5 in both upper extremities, diminished in both lower extremities due to chest Bowring pain.Marland Kitchen  Psychiatric: Normal judgment and insight. Alert and oriented to person, place, and year. Normal mood.    Labs on Admission: I have personally reviewed following labs and imaging studies  CBC: Recent Labs  Lab 04/19/19 1830  WBC 8.6  NEUTROABS 6.4  HGB 10.7*  HCT 35.2*  MCV 91.9  PLT 681   Basic Metabolic Panel: Recent Labs  Lab 04/19/19 1830  NA 145  K 4.4  CL 115*  CO2 20*  GLUCOSE 107*  BUN 39*  CREATININE 1.79*  CALCIUM 10.2   GFR: Estimated Creatinine Clearance: 18.9 mL/min (A) (by C-G formula based on SCr of 1.79 mg/dL (H)). Liver Function Tests: Recent Labs  Lab 04/19/19 1830  AST 27  ALT 27  ALKPHOS 129*  BILITOT 0.5  PROT 6.7  ALBUMIN 3.5   No results for input(s): LIPASE, AMYLASE in the last 168 hours. No results for input(s): AMMONIA in the last 168 hours. Coagulation Profile: Recent Labs  Lab 04/16/19 0914  INR 2.7*   Cardiac Enzymes: Recent Labs  Lab 04/19/19 1830  CKTOTAL 249*   BNP (last 3  results) No results for input(s): PROBNP in the last 8760 hours. HbA1C: No results for input(s): HGBA1C in the last 72 hours. CBG: No results for input(s): GLUCAP in the last 168 hours. Lipid Profile: No results for input(s): CHOL, HDL, LDLCALC, TRIG, CHOLHDL, LDLDIRECT in the last 72 hours. Thyroid Function Tests: No results for input(s): TSH, T4TOTAL, FREET4, T3FREE, THYROIDAB in the last 72 hours. Anemia Panel: No results for input(s): VITAMINB12, FOLATE, FERRITIN, TIBC, IRON, RETICCTPCT in the last 72 hours. Urine analysis:    Component Value Date/Time   COLORURINE AMBER (A) 10/27/2016 2120   APPEARANCEUR CLEAR 10/27/2016 2120   LABSPEC 1.017 10/27/2016 2120   PHURINE 6.5 10/27/2016 2120   GLUCOSEU NEGATIVE 10/27/2016 2120   HGBUR SMALL (A) 10/27/2016 2120   BILIRUBINUR NEGATIVE 10/27/2016 2120   BILIRUBINUR neg 09/22/2014 1012   KETONESUR NEGATIVE 10/27/2016 2120   PROTEINUR 100 (A) 10/27/2016 2120   UROBILINOGEN 0.2 10/15/2014 1144   NITRITE NEGATIVE 10/27/2016 2120   LEUKOCYTESUR NEGATIVE 10/27/2016 2120    Radiological Exams on Admission: Ct Abdomen Wo Contrast  Result Date: 04/19/2019 CLINICAL DATA:  85 year old post fall this morning. Rib fractures on radiograph. EXAM: CT CHEST, ABDOMEN AND PELVIS WITHOUT CONTRAST TECHNIQUE: Multidetector CT imaging of the chest, abdomen and pelvis was performed following the standard protocol without IV contrast. COMPARISON:  Chest radiograph earlier this day. FINDINGS: CT CHEST FINDINGS Cardiovascular: Aortic atherosclerosis. No periaortic stranding to suggest injury. Mild cardiomegaly. There are coronary artery calcifications. No pericardial effusion. Mediastinum/Nodes: Mediastinal hemorrhage or hematoma. No pneumomediastinum. The esophagus is decompressed. Slightly heterogeneous thyroid gland without dominant nodule. Evidence of mediastinal adenopathy, lack of IV contrast limits detailed assessment. Lungs/Pleura: No pneumothorax.  Evidence pulmonary contusion. Trace left pleural thickening/effusion and atelectasis the lower lobe. No right pleural fluid. Linear atelectasis or scarring in the anterior right upper lobe and right middle lobe. Calcified granuloma in the right middle lobe. No pulmonary edema. Musculoskeletal: Acute anterolateral minimally displaced left fifth through ninth rib fractures. Remote sternal fracture. Remote T6 compression fracture. Included shoulder girdles and clavicles are intact. No confluent chest Loretto  contusion. CT ABDOMEN PELVIS FINDINGS Hepatobiliary: Lack of IV contrast limits assessment for injury. Allowing for this, no evidence of hepatic injury. 11 mm simple cyst in the left lobe of the liver. No perihepatic hematoma. Gallbladder is unremarkable. Pancreas: No evidence of injury. No ductal dilatation or inflammation. Spleen: Lack of IV contrast limits detailed assessment. Allowing for this, no evidence of injury. No perisplenic hematoma. Adrenals/Urinary Tract: No adrenal hemorrhage or renal injury identified. Bilateral renal parenchymal thinning. Cortical scarring in the upper right kidney. Bladder is unremarkable. Stomach/Bowel: Small hiatal hernia. Stomach nondistended. No bowel Trull thickening or inflammatory change. No evidence of bowel injury. No mesenteric hematoma. Sutures at the base of the cecum from presumed appendectomy. Distal colonic diverticulosis without diverticulitis. Vascular/Lymphatic: Aortic atherosclerosis without aneurysm. No retroperitoneal fluid or stranding. No adenopathy. Reproductive: Status post hysterectomy. No adnexal masses. Other: No free fluid or free air. No confluent body Kooy contusion. Musculoskeletal: No fracture of the pelvis or lumbar spine. IMPRESSION: 1. Acute anterolateral minimally displaced left fifth through ninth rib fractures. No pneumothorax. Trace left pleural effusion/pleural thickening. 2. No additional acute traumatic injury to the chest, abdomen, or  pelvis. 3. Remote sternal fracture.  Remote T6 compression fracture. 4. Coronary artery calcifications. Aortic Atherosclerosis (ICD10-I70.0). Electronically Signed   By: Elizabeth Burch Burch.D.   On: 04/19/2019 21:20   Dg Chest 1 View  Result Date: 04/19/2019 CLINICAL DATA:  Fall EXAM: CHEST  1 VIEW COMPARISON:  12/29/2016 FINDINGS: Cardiomegaly. Both lungs are clear. Multiple minimally displaced fractures of the left ribs. IMPRESSION: 1. Multiple minimally displaced fractures of the left ribs. No pneumothorax or pleural effusion is appreciated. 2.  Cardiomegaly. Electronically Signed   By: Eddie Candle Burch.D.   On: 04/19/2019 19:38   Dg Pelvis 1-2 Views  Result Date: 04/19/2019 CLINICAL DATA:  Fall EXAM: PELVIS - 1-2 VIEW COMPARISON:  None. FINDINGS: Marked osteopenia. No obvious displaced fracture of the pelvis or bilateral proximal femurs. IMPRESSION: Marked osteopenia. No obvious displaced fracture of the pelvis or bilateral proximal femurs. Please note that plain radiographs are significantly limited for hip and pelvic fracture in the setting of osteopenia; recommend MRI to more sensitively evaluate for marrow edema and fracture if suspected. Electronically Signed   By: Eddie Candle Burch.D.   On: 04/19/2019 19:40   Ct Head Wo Contrast  Result Date: 04/19/2019 CLINICAL DATA:  Unwitnessed fall. EXAM: CT HEAD WITHOUT CONTRAST CT CERVICAL SPINE WITHOUT CONTRAST TECHNIQUE: Multidetector CT imaging of the head and cervical spine was performed following the standard protocol without intravenous contrast. Multiplanar CT image reconstructions of the cervical spine were also generated. COMPARISON:  None. FINDINGS: CT HEAD FINDINGS Brain: No evidence of acute infarction, hemorrhage, hydrocephalus, extra-axial collection or mass lesion/mass effect. There is mild diffuse low-attenuation within the subcortical and periventricular white matter compatible with chronic microvascular disease. Vascular: No hyperdense vessel  or unexpected calcification. Skull: Normal. Negative for fracture or focal lesion. Sinuses/Orbits: Mild mucosal thickening involves the maxillary sinuses. Other: None. CT CERVICAL SPINE FINDINGS Alignment: Normal alignment of the cervical spine. Skull base and vertebrae: No acute fracture. No primary bone lesion or focal pathologic process. Soft tissues and spinal canal: No prevertebral fluid or swelling. No visible canal hematoma. Disc levels: Mild multi level ventral endplate spurring noted. Disc spaces are relatively well preserved. Upper chest: Negative. Other: None IMPRESSION: 1. No acute intracranial abnormalities. 2. Mild chronic small vessel ischemic change. 3. No evidence for cervical spine fracture or dislocation. Electronically Signed   By: Lovena Le  Clovis Riley Burch.D.   On: 04/19/2019 19:25   Ct Chest Wo Contrast  Result Date: 04/19/2019 CLINICAL DATA:  85 year old post fall this morning. Rib fractures on radiograph. EXAM: CT CHEST, ABDOMEN AND PELVIS WITHOUT CONTRAST TECHNIQUE: Multidetector CT imaging of the chest, abdomen and pelvis was performed following the standard protocol without IV contrast. COMPARISON:  Chest radiograph earlier this day. FINDINGS: CT CHEST FINDINGS Cardiovascular: Aortic atherosclerosis. No periaortic stranding to suggest injury. Mild cardiomegaly. There are coronary artery calcifications. No pericardial effusion. Mediastinum/Nodes: Mediastinal hemorrhage or hematoma. No pneumomediastinum. The esophagus is decompressed. Slightly heterogeneous thyroid gland without dominant nodule. Evidence of mediastinal adenopathy, lack of IV contrast limits detailed assessment. Lungs/Pleura: No pneumothorax. Evidence pulmonary contusion. Trace left pleural thickening/effusion and atelectasis the lower lobe. No right pleural fluid. Linear atelectasis or scarring in the anterior right upper lobe and right middle lobe. Calcified granuloma in the right middle lobe. No pulmonary edema.  Musculoskeletal: Acute anterolateral minimally displaced left fifth through ninth rib fractures. Remote sternal fracture. Remote T6 compression fracture. Included shoulder girdles and clavicles are intact. No confluent chest Pesch contusion. CT ABDOMEN PELVIS FINDINGS Hepatobiliary: Lack of IV contrast limits assessment for injury. Allowing for this, no evidence of hepatic injury. 11 mm simple cyst in the left lobe of the liver. No perihepatic hematoma. Gallbladder is unremarkable. Pancreas: No evidence of injury. No ductal dilatation or inflammation. Spleen: Lack of IV contrast limits detailed assessment. Allowing for this, no evidence of injury. No perisplenic hematoma. Adrenals/Urinary Tract: No adrenal hemorrhage or renal injury identified. Bilateral renal parenchymal thinning. Cortical scarring in the upper right kidney. Bladder is unremarkable. Stomach/Bowel: Small hiatal hernia. Stomach nondistended. No bowel Bellucci thickening or inflammatory change. No evidence of bowel injury. No mesenteric hematoma. Sutures at the base of the cecum from presumed appendectomy. Distal colonic diverticulosis without diverticulitis. Vascular/Lymphatic: Aortic atherosclerosis without aneurysm. No retroperitoneal fluid or stranding. No adenopathy. Reproductive: Status post hysterectomy. No adnexal masses. Other: No free fluid or free air. No confluent body Goetsch contusion. Musculoskeletal: No fracture of the pelvis or lumbar spine. IMPRESSION: 1. Acute anterolateral minimally displaced left fifth through ninth rib fractures. No pneumothorax. Trace left pleural effusion/pleural thickening. 2. No additional acute traumatic injury to the chest, abdomen, or pelvis. 3. Remote sternal fracture.  Remote T6 compression fracture. 4. Coronary artery calcifications. Aortic Atherosclerosis (ICD10-I70.0). Electronically Signed   By: Elizabeth Burch Burch.D.   On: 04/19/2019 21:20   Ct Cervical Spine Wo Contrast  Result Date: 04/19/2019 CLINICAL  DATA:  Unwitnessed fall. EXAM: CT HEAD WITHOUT CONTRAST CT CERVICAL SPINE WITHOUT CONTRAST TECHNIQUE: Multidetector CT imaging of the head and cervical spine was performed following the standard protocol without intravenous contrast. Multiplanar CT image reconstructions of the cervical spine were also generated. COMPARISON:  None. FINDINGS: CT HEAD FINDINGS Brain: No evidence of acute infarction, hemorrhage, hydrocephalus, extra-axial collection or mass lesion/mass effect. There is mild diffuse low-attenuation within the subcortical and periventricular white matter compatible with chronic microvascular disease. Vascular: No hyperdense vessel or unexpected calcification. Skull: Normal. Negative for fracture or focal lesion. Sinuses/Orbits: Mild mucosal thickening involves the maxillary sinuses. Other: None. CT CERVICAL SPINE FINDINGS Alignment: Normal alignment of the cervical spine. Skull base and vertebrae: No acute fracture. No primary bone lesion or focal pathologic process. Soft tissues and spinal canal: No prevertebral fluid or swelling. No visible canal hematoma. Disc levels: Mild multi level ventral endplate spurring noted. Disc spaces are relatively well preserved. Upper chest: Negative. Other: None IMPRESSION: 1. No acute intracranial  abnormalities. 2. Mild chronic small vessel ischemic change. 3. No evidence for cervical spine fracture or dislocation. Electronically Signed   By: Kerby Moors Burch.D.   On: 04/19/2019 19:25    EKG: Independently reviewed. Sinus rhythm with motion artifact, no acute ischemic changes.  Assessment/Plan Principal Problem:   Syncope Active Problems:   Essential hypertension   CKD (chronic kidney disease), stage IV ([facility])   Gout   History of DVT of lower extremity   Chronic anticoagulation   Multiple rib fractures involving four or more ribs   Frequent falls  NALLELY YOST is a 85 y.o. female with medical history significant for hypertension, CKD stage IV,  history of DVT on Coumadin, and gout who is admitted after a syncopal fall at home with resulting left rib fractures.   Syncope with fall: Unclear etiology, patient does not recall events surrounding her fall.  Will monitor on telemetry, give 1 L of IV fluids, and obtain echocardiogram.  PT/OT evaluation also requested.  Left fifth through ninth rib fractures: Minimally displaced and without pneumothorax based on CT read. -Trauma surgery consulted, will continue to follow -Continue incentive spirometer -Pain control as needed  History of DVT on chronic Coumadin: She denies any obvious bleeding.  CT head is without intracranial hemorrhage.  Continue Coumadin for now, however needs further discussions regarding risks versus benefits of continued anticoagulation in the setting of advanced age and falls.  CKD stage IV: Currently stable, continue to monitor.  Hypertension: Has been hypertensive in the ED.  Continue home amlodipine, losartan, Toprol-XL, and pain control.  Gout: Currently stable.  Continue home from febuxostat   DVT prophylaxis: Coumadin Code Status: DNR, confirmed with patient Family Communication: None present on admission. Disposition Plan: Pending PT/OT eval Consults called: Trauma surgery Admission status: Observation   Zada Finders MD [facility]  If 7PM-7AM, please contact night-coverage [email or web address]  04/19/2019, 11:13 PM

## 2019-04-19 NOTE — Progress Notes (Signed)
Attempted to call for report, nurse unavailable. Awaiting call back from ED nurse.

## 2019-04-19 NOTE — ED Notes (Signed)
Pt at CT

## 2019-04-19 NOTE — ED Notes (Signed)
ED TO INPATIENT HANDOFF REPORT  ED Nurse Name and Phone #: [phone]  S Name/Age/Gender Elizabeth Burch 85 y.o. female Room/Bed: 024C/024C  Code Status   Code Status: Not on file  Home/SNF/Other Home Patient oriented to: self, place, time and situation Is this baseline? Yes   Triage Complete: Triage complete  Chief Complaint FALL  Triage Note Per [facility]- pt had a fall this morning, maybe around 10am. She remembers eating breakfast and walking into the living room where she fell, unknown if it was mechanical or syncopal. Pt lives alone. Pt family- STEVE son 51 [place] brother [phone]. Pt has pain to left rib cage. EKG normal with EMS.    Allergies Allergies  Allergen Reactions  . Prednisone Hypertension  . Vigamox [Moxifloxacin] Nausea And Vomiting  . Allopurinol   . Keflex [Cephalexin]     Rash   . Ace Inhibitors Other (See Comments)    unknown  . Alendronate Sodium Rash  . Ciprofloxacin Rash  . Clarithromycin Rash  . Clindamycin/Lincomycin Rash  . Nitrofurantoin Rash  . Ofloxacin Rash  . Penicillins Rash  . Sulfamethoxazole Rash  . Sulfonamide Derivatives Rash    Level of Care/Admitting Diagnosis ED Disposition    ED Disposition Condition [facility] Area: [place] [[id number]]  Level of Care: Telemetry Cardiac [103]  I expect the patient will be discharged within 24 hours: Yes  LOW acuity---Tx typically complete <24 hrs---ACUTE conditions typically can be evaluated <24 hours---LABS likely to return to acceptable levels <24 hours---IS near functional baseline---EXPECTED to return to current living arrangement---NOT newly hypoxic: Meets criteria for 5C-Observation unit  Covid Evaluation: Screening Protocol (No Symptoms)  Diagnosis: Syncope [947654]  Admitting Physician: Lenore Cordia [[id number]]  Attending Physician: Lenore Cordia [[id number]]  PT Class (Do Not Modify): Observation [104]  PT Acc Code (Do Not  Modify): Observation [10022]       B Medical/Surgery History Past Medical History:  Diagnosis Date  . Arthritis    Gout  . Cataract   . Chronic kidney disease, stage IV (severe) ([place])   . Diverticulosis of colon (without mention of hemorrhage) 2008  . DVT (deep venous thrombosis) ([place]) 1950  . External hemorrhoids without mention of complication 1700  . Fibrocystic breast disease   . GERD (gastroesophageal reflux disease) 2004  . Glaucoma   . Gout   . Hiatal hernia 2004  . HTN (hypertension)   . Palpitations   . Peripheral vascular disease [facility])    Past Surgical History:  Procedure Laterality Date  . APPENDECTOMY    . BREAST EXCISIONAL BIOPSY Bilateral 1975  . CESAREAN SECTION    . CYSTECTOMY     on colon  . EYE SURGERY Bilateral   . TONSILLECTOMY       A IV Location/Drains/Wounds Patient Lines/Drains/Airways Status   Active Line/Drains/Airways    Name:   Placement date:   Placement time:   Site:   Days:   Peripheral IV 10/27/16 Left Antecubital   10/27/16    2103    Antecubital   904          Intake/Output Last 24 hours No intake or output data in the 24 hours ending 04/19/19 2247  Labs/Imaging Results for orders placed or performed during the hospital encounter of 04/19/19 (from the past 48 hour(s))  CBC with Differential     Status: Abnormal   Collection Time: 04/19/19  6:30 PM  Result Value Ref  Range   WBC 8.6 4.0 - 10.5 K/uL   RBC 3.83 (L) 3.87 - 5.11 MIL/uL   Hemoglobin 10.7 (L) 12.0 - 15.0 g/dL   HCT 35.2 (L) 36.0 - 46.0 %   MCV 91.9 80.0 - 100.0 fL   MCH 27.9 26.0 - 34.0 pg   MCHC 30.4 30.0 - 36.0 g/dL   RDW 17.5 (H) 11.5 - 15.5 %   Platelets 224 150 - 400 K/uL   nRBC 0.0 0.0 - 0.2 %   Neutrophils Relative % 75 %   Neutro Abs 6.4 1.7 - 7.7 K/uL   Lymphocytes Relative 14 %   Lymphs Abs 1.2 0.7 - 4.0 K/uL   Monocytes Relative 11 %   Monocytes Absolute 1.0 0.1 - 1.0 K/uL   Eosinophils Relative 0 %   Eosinophils Absolute 0.0 0.0 - 0.5 K/uL    Basophils Relative 0 %   Basophils Absolute 0.0 0.0 - 0.1 K/uL   Immature Granulocytes 0 %   Abs Immature Granulocytes 0.03 0.00 - 0.07 K/uL    Comment: Performed at [facility], [address] [place], [place] [address]  Comprehensive metabolic panel     Status: Abnormal   Collection Time: 04/19/19  6:30 PM  Result Value Ref Range   Sodium 145 135 - 145 mmol/L   Potassium 4.4 3.5 - 5.1 mmol/L   Chloride 115 (H) 98 - 111 mmol/L   CO2 20 (L) 22 - 32 mmol/L   Glucose, Bld 107 (H) 70 - 99 mg/dL   BUN 39 (H) 8 - 23 mg/dL   Creatinine, Ser 1.79 (H) 0.44 - 1.00 mg/dL   Calcium 10.2 8.9 - 10.3 mg/dL   Total Protein 6.7 6.5 - 8.1 g/dL   Albumin 3.5 3.5 - 5.0 g/dL   AST 27 15 - 41 U/L   ALT 27 0 - 44 U/L   Alkaline Phosphatase 129 (H) 38 - 126 U/L   Total Bilirubin 0.5 0.3 - 1.2 mg/dL   GFR calc non Af Amer 25 (L) >60 mL/min   GFR calc Af Amer 28 (L) >60 mL/min   Anion gap 10 5 - 15    Comment: Performed at [facility], [place] [address] [place], [place] [address]  CK     Status: Abnormal   Collection Time: 04/19/19  6:30 PM  Result Value Ref Range   Total CK 249 (H) 38 - 234 U/L    Comment: Performed at [facility], [place] [address], [place], [place] [address]  SARS Coronavirus 2 (CEPHEID - Performed in [place] hospital lab), Hosp Order     Status: None   Collection Time: 04/19/19  9:33 PM  Result Value Ref Range   SARS Coronavirus 2 NEGATIVE NEGATIVE    Comment: (NOTE) If result is NEGATIVE SARS-CoV-2 target nucleic acids are NOT DETECTED. The SARS-CoV-2 RNA is generally detectable in upper and lower  respiratory specimens during the acute phase of infection. The lowest  concentration of SARS-CoV-2 viral copies this assay can detect is 250  copies / mL. A negative result does not preclude SARS-CoV-2 infection  and should not be used as the sole basis for treatment or other  patient management decisions.  A negative result may occur with  improper specimen  collection / handling, submission of specimen other  than nasopharyngeal swab, presence of viral mutation(s) within the  areas targeted by this assay, and inadequate number of viral copies  (<250 copies / mL). A  negative result must be combined with clinical  observations, patient history, and epidemiological information. If result is POSITIVE SARS-CoV-2 target nucleic acids are DETECTED. The SARS-CoV-2 RNA is generally detectable in upper and lower  respiratory specimens dur ing the acute phase of infection.  Positive  results are indicative of active infection with SARS-CoV-2.  Clinical  correlation with patient history and other diagnostic information is  necessary to determine patient infection status.  Positive results do  not rule out bacterial infection or co-infection with other viruses. If result is PRESUMPTIVE POSTIVE SARS-CoV-2 nucleic acids MAY BE PRESENT.   A presumptive positive result was obtained on the submitted specimen  and confirmed on repeat testing.  While 2019 novel coronavirus  (SARS-CoV-2) nucleic acids may be present in the submitted sample  additional confirmatory testing may be necessary for epidemiological  and / or clinical management purposes  to differentiate between  SARS-CoV-2 and other Sarbecovirus currently known to infect humans.  If clinically indicated additional testing with an alternate test  methodology [phone]) is advised. The SARS-CoV-2 RNA is generally  detectable in upper and lower respiratory sp ecimens during the acute  phase of infection. The expected result is Negative. Fact Sheet for Patients:  StrictlyIdeas.no Fact Sheet for Healthcare Providers: BankingDealers.co.za This test is not yet approved or cleared by the [place] FDA and has been authorized for detection and/or diagnosis of SARS-CoV-2 by FDA under an Emergency Use Authorization (EUA).  This EUA will remain in effect  (meaning this test can be used) for the duration of the COVID-19 declaration under Section 564(b)(1) of the Act, 21 U.S.C. section 360bbb-3(b)(1), unless the authorization is terminated or revoked sooner. Performed at [facility], [place] [address] [place], [place] [address]    Ct Abdomen Wo Contrast  Result Date: 04/19/2019 CLINICAL DATA:  85 year old post fall this morning. Rib fractures on radiograph. EXAM: CT CHEST, ABDOMEN AND PELVIS WITHOUT CONTRAST TECHNIQUE: Multidetector CT imaging of the chest, abdomen and pelvis was performed following the standard protocol without IV contrast. COMPARISON:  Chest radiograph earlier this day. FINDINGS: CT CHEST FINDINGS Cardiovascular: Aortic atherosclerosis. No periaortic stranding to suggest injury. Mild cardiomegaly. There are coronary artery calcifications. No pericardial effusion. Mediastinum/Nodes: Mediastinal hemorrhage or hematoma. No pneumomediastinum. The esophagus is decompressed. Slightly heterogeneous thyroid gland without dominant nodule. Evidence of mediastinal adenopathy, lack of IV contrast limits detailed assessment. Lungs/Pleura: No pneumothorax. Evidence pulmonary contusion. Trace left pleural thickening/effusion and atelectasis the lower lobe. No right pleural fluid. Linear atelectasis or scarring in the anterior right upper lobe and right middle lobe. Calcified granuloma in the right middle lobe. No pulmonary edema. Musculoskeletal: Acute anterolateral minimally displaced left fifth through ninth rib fractures. Remote sternal fracture. Remote T6 compression fracture. Included shoulder girdles and clavicles are intact. No confluent chest Macqueen contusion. CT ABDOMEN PELVIS FINDINGS Hepatobiliary: Lack of IV contrast limits assessment for injury. Allowing for this, no evidence of hepatic injury. 11 mm simple cyst in the left lobe of the liver. No perihepatic hematoma. Gallbladder is unremarkable. Pancreas: No evidence of injury. No ductal  dilatation or inflammation. Spleen: Lack of IV contrast limits detailed assessment. Allowing for this, no evidence of injury. No perisplenic hematoma. Adrenals/Urinary Tract: No adrenal hemorrhage or renal injury identified. Bilateral renal parenchymal thinning. Cortical scarring in the upper right kidney. Bladder is unremarkable. Stomach/Bowel: Small hiatal hernia. Stomach nondistended. No bowel Schumm thickening or inflammatory change. No evidence of bowel injury. No mesenteric hematoma. Sutures at the base of the cecum  from presumed appendectomy. Distal colonic diverticulosis without diverticulitis. Vascular/Lymphatic: Aortic atherosclerosis without aneurysm. No retroperitoneal fluid or stranding. No adenopathy. Reproductive: Status post hysterectomy. No adnexal masses. Other: No free fluid or free air. No confluent body Rossbach contusion. Musculoskeletal: No fracture of the pelvis or lumbar spine. IMPRESSION: 1. Acute anterolateral minimally displaced left fifth through ninth rib fractures. No pneumothorax. Trace left pleural effusion/pleural thickening. 2. No additional acute traumatic injury to the chest, abdomen, or pelvis. 3. Remote sternal fracture.  Remote T6 compression fracture. 4. Coronary artery calcifications. Aortic Atherosclerosis (ICD10-I70.0). Electronically Signed   By: Keith Rake M.D.   On: 04/19/2019 21:20   Dg Chest 1 View  Result Date: 04/19/2019 CLINICAL DATA:  Fall EXAM: CHEST  1 VIEW COMPARISON:  12/29/2016 FINDINGS: Cardiomegaly. Both lungs are clear. Multiple minimally displaced fractures of the left ribs. IMPRESSION: 1. Multiple minimally displaced fractures of the left ribs. No pneumothorax or pleural effusion is appreciated. 2.  Cardiomegaly. Electronically Signed   By: Eddie Candle M.D.   On: 04/19/2019 19:38   Dg Pelvis 1-2 Views  Result Date: 04/19/2019 CLINICAL DATA:  Fall EXAM: PELVIS - 1-2 VIEW COMPARISON:  None. FINDINGS: Marked osteopenia. No obvious displaced  fracture of the pelvis or bilateral proximal femurs. IMPRESSION: Marked osteopenia. No obvious displaced fracture of the pelvis or bilateral proximal femurs. Please note that plain radiographs are significantly limited for hip and pelvic fracture in the setting of osteopenia; recommend MRI to more sensitively evaluate for marrow edema and fracture if suspected. Electronically Signed   By: Eddie Candle M.D.   On: 04/19/2019 19:40   Ct Head Wo Contrast  Result Date: 04/19/2019 CLINICAL DATA:  Unwitnessed fall. EXAM: CT HEAD WITHOUT CONTRAST CT CERVICAL SPINE WITHOUT CONTRAST TECHNIQUE: Multidetector CT imaging of the head and cervical spine was performed following the standard protocol without intravenous contrast. Multiplanar CT image reconstructions of the cervical spine were also generated. COMPARISON:  None. FINDINGS: CT HEAD FINDINGS Brain: No evidence of acute infarction, hemorrhage, hydrocephalus, extra-axial collection or mass lesion/mass effect. There is mild diffuse low-attenuation within the subcortical and periventricular white matter compatible with chronic microvascular disease. Vascular: No hyperdense vessel or unexpected calcification. Skull: Normal. Negative for fracture or focal lesion. Sinuses/Orbits: Mild mucosal thickening involves the maxillary sinuses. Other: None. CT CERVICAL SPINE FINDINGS Alignment: Normal alignment of the cervical spine. Skull base and vertebrae: No acute fracture. No primary bone lesion or focal pathologic process. Soft tissues and spinal canal: No prevertebral fluid or swelling. No visible canal hematoma. Disc levels: Mild multi level ventral endplate spurring noted. Disc spaces are relatively well preserved. Upper chest: Negative. Other: None IMPRESSION: 1. No acute intracranial abnormalities. 2. Mild chronic small vessel ischemic change. 3. No evidence for cervical spine fracture or dislocation. Electronically Signed   By: Kerby Moors M.D.   On: 04/19/2019 19:25    Ct Chest Wo Contrast  Result Date: 04/19/2019 CLINICAL DATA:  85 year old post fall this morning. Rib fractures on radiograph. EXAM: CT CHEST, ABDOMEN AND PELVIS WITHOUT CONTRAST TECHNIQUE: Multidetector CT imaging of the chest, abdomen and pelvis was performed following the standard protocol without IV contrast. COMPARISON:  Chest radiograph earlier this day. FINDINGS: CT CHEST FINDINGS Cardiovascular: Aortic atherosclerosis. No periaortic stranding to suggest injury. Mild cardiomegaly. There are coronary artery calcifications. No pericardial effusion. Mediastinum/Nodes: Mediastinal hemorrhage or hematoma. No pneumomediastinum. The esophagus is decompressed. Slightly heterogeneous thyroid gland without dominant nodule. Evidence of mediastinal adenopathy, lack of IV contrast limits detailed assessment. Lungs/Pleura: No  pneumothorax. Evidence pulmonary contusion. Trace left pleural thickening/effusion and atelectasis the lower lobe. No right pleural fluid. Linear atelectasis or scarring in the anterior right upper lobe and right middle lobe. Calcified granuloma in the right middle lobe. No pulmonary edema. Musculoskeletal: Acute anterolateral minimally displaced left fifth through ninth rib fractures. Remote sternal fracture. Remote T6 compression fracture. Included shoulder girdles and clavicles are intact. No confluent chest Matus contusion. CT ABDOMEN PELVIS FINDINGS Hepatobiliary: Lack of IV contrast limits assessment for injury. Allowing for this, no evidence of hepatic injury. 11 mm simple cyst in the left lobe of the liver. No perihepatic hematoma. Gallbladder is unremarkable. Pancreas: No evidence of injury. No ductal dilatation or inflammation. Spleen: Lack of IV contrast limits detailed assessment. Allowing for this, no evidence of injury. No perisplenic hematoma. Adrenals/Urinary Tract: No adrenal hemorrhage or renal injury identified. Bilateral renal parenchymal thinning. Cortical scarring in the  upper right kidney. Bladder is unremarkable. Stomach/Bowel: Small hiatal hernia. Stomach nondistended. No bowel Daversa thickening or inflammatory change. No evidence of bowel injury. No mesenteric hematoma. Sutures at the base of the cecum from presumed appendectomy. Distal colonic diverticulosis without diverticulitis. Vascular/Lymphatic: Aortic atherosclerosis without aneurysm. No retroperitoneal fluid or stranding. No adenopathy. Reproductive: Status post hysterectomy. No adnexal masses. Other: No free fluid or free air. No confluent body Paxton contusion. Musculoskeletal: No fracture of the pelvis or lumbar spine. IMPRESSION: 1. Acute anterolateral minimally displaced left fifth through ninth rib fractures. No pneumothorax. Trace left pleural effusion/pleural thickening. 2. No additional acute traumatic injury to the chest, abdomen, or pelvis. 3. Remote sternal fracture.  Remote T6 compression fracture. 4. Coronary artery calcifications. Aortic Atherosclerosis (ICD10-I70.0). Electronically Signed   By: Keith Rake M.D.   On: 04/19/2019 21:20   Ct Cervical Spine Wo Contrast  Result Date: 04/19/2019 CLINICAL DATA:  Unwitnessed fall. EXAM: CT HEAD WITHOUT CONTRAST CT CERVICAL SPINE WITHOUT CONTRAST TECHNIQUE: Multidetector CT imaging of the head and cervical spine was performed following the standard protocol without intravenous contrast. Multiplanar CT image reconstructions of the cervical spine were also generated. COMPARISON:  None. FINDINGS: CT HEAD FINDINGS Brain: No evidence of acute infarction, hemorrhage, hydrocephalus, extra-axial collection or mass lesion/mass effect. There is mild diffuse low-attenuation within the subcortical and periventricular white matter compatible with chronic microvascular disease. Vascular: No hyperdense vessel or unexpected calcification. Skull: Normal. Negative for fracture or focal lesion. Sinuses/Orbits: Mild mucosal thickening involves the maxillary sinuses. Other:  None. CT CERVICAL SPINE FINDINGS Alignment: Normal alignment of the cervical spine. Skull base and vertebrae: No acute fracture. No primary bone lesion or focal pathologic process. Soft tissues and spinal canal: No prevertebral fluid or swelling. No visible canal hematoma. Disc levels: Mild multi level ventral endplate spurring noted. Disc spaces are relatively well preserved. Upper chest: Negative. Other: None IMPRESSION: 1. No acute intracranial abnormalities. 2. Mild chronic small vessel ischemic change. 3. No evidence for cervical spine fracture or dislocation. Electronically Signed   By: Kerby Moors M.D.   On: 04/19/2019 19:25    Pending Labs Unresulted Labs (From admission, onward)    Start     Ordered   04/19/19 1828  Urinalysis, Routine w reflex microscopic  Once,   R     04/19/19 1827          Vitals/Pain Today's Vitals   04/19/19 2030 04/19/19 2130 04/19/19 2138 04/19/19 2145  BP: (!) 161/64 (!) 149/57  (!) 156/51  Pulse: 69 64  68  Resp:      Temp:  TempSrc:      SpO2: 98% 98%  98%  Weight:      Height:      PainSc:   0-No pain     Isolation Precautions No active isolations  Medications Medications - No data to display  Mobility walks with person assist High fall risk   Focused Assessments Neuro Assessment Handoff:  Swallow screen pass? N/A Cardiac Rhythm: Normal sinus rhythm       Neuro Assessment: Within Defined Limits Neuro Checks:      Last Documented NIHSS Modified Score:   Has TPA been given? No If patient is a Neuro Trauma and patient is going to OR before floor call report to [place] nurse: [phone] or [phone]     R Recommendations: See Admitting Provider Note  Report given to:   Additional Notes: N/A

## 2019-04-20 ENCOUNTER — Observation Stay (HOSPITAL_COMMUNITY): Payer: Medicare Other

## 2019-04-20 ENCOUNTER — Inpatient Hospital Stay (HOSPITAL_COMMUNITY): Payer: Medicare Other

## 2019-04-20 DIAGNOSIS — R1084 Generalized abdominal pain: Secondary | ICD-10-CM | POA: Diagnosis not present

## 2019-04-20 DIAGNOSIS — I361 Nonrheumatic tricuspid (valve) insufficiency: Secondary | ICD-10-CM | POA: Diagnosis not present

## 2019-04-20 DIAGNOSIS — W1830XA Fall on same level, unspecified, initial encounter: Secondary | ICD-10-CM | POA: Diagnosis present

## 2019-04-20 DIAGNOSIS — I129 Hypertensive chronic kidney disease with stage 1 through stage 4 chronic kidney disease, or unspecified chronic kidney disease: Secondary | ICD-10-CM | POA: Diagnosis not present

## 2019-04-20 DIAGNOSIS — S27321A Contusion of lung, unilateral, initial encounter: Secondary | ICD-10-CM | POA: Diagnosis not present

## 2019-04-20 DIAGNOSIS — S2242XA Multiple fractures of ribs, left side, initial encounter for closed fracture: Secondary | ICD-10-CM | POA: Diagnosis not present

## 2019-04-20 DIAGNOSIS — G9340 Encephalopathy, unspecified: Secondary | ICD-10-CM | POA: Diagnosis not present

## 2019-04-20 DIAGNOSIS — R4701 Aphasia: Secondary | ICD-10-CM | POA: Diagnosis not present

## 2019-04-20 DIAGNOSIS — R296 Repeated falls: Secondary | ICD-10-CM

## 2019-04-20 DIAGNOSIS — Z515 Encounter for palliative care: Secondary | ICD-10-CM | POA: Diagnosis not present

## 2019-04-20 DIAGNOSIS — H4010X0 Unspecified open-angle glaucoma, stage unspecified: Secondary | ICD-10-CM | POA: Diagnosis present

## 2019-04-20 DIAGNOSIS — Z800 Family history of malignant neoplasm of digestive organs: Secondary | ICD-10-CM | POA: Diagnosis not present

## 2019-04-20 DIAGNOSIS — Z803 Family history of malignant neoplasm of breast: Secondary | ICD-10-CM | POA: Diagnosis not present

## 2019-04-20 DIAGNOSIS — M810 Age-related osteoporosis without current pathological fracture: Secondary | ICD-10-CM | POA: Diagnosis present

## 2019-04-20 DIAGNOSIS — M1990 Unspecified osteoarthritis, unspecified site: Secondary | ICD-10-CM | POA: Diagnosis present

## 2019-04-20 DIAGNOSIS — N184 Chronic kidney disease, stage 4 (severe): Secondary | ICD-10-CM | POA: Diagnosis not present

## 2019-04-20 DIAGNOSIS — I10 Essential (primary) hypertension: Secondary | ICD-10-CM

## 2019-04-20 DIAGNOSIS — E785 Hyperlipidemia, unspecified: Secondary | ICD-10-CM | POA: Diagnosis present

## 2019-04-20 DIAGNOSIS — Y92010 Kitchen of single-family (private) house as the place of occurrence of the external cause: Secondary | ICD-10-CM | POA: Diagnosis not present

## 2019-04-20 DIAGNOSIS — I340 Nonrheumatic mitral (valve) insufficiency: Secondary | ICD-10-CM | POA: Diagnosis not present

## 2019-04-20 DIAGNOSIS — K5730 Diverticulosis of large intestine without perforation or abscess without bleeding: Secondary | ICD-10-CM | POA: Diagnosis present

## 2019-04-20 DIAGNOSIS — R55 Syncope and collapse: Secondary | ICD-10-CM | POA: Diagnosis present

## 2019-04-20 DIAGNOSIS — Z86718 Personal history of other venous thrombosis and embolism: Secondary | ICD-10-CM | POA: Diagnosis not present

## 2019-04-20 DIAGNOSIS — Z7901 Long term (current) use of anticoagulants: Secondary | ICD-10-CM | POA: Diagnosis not present

## 2019-04-20 DIAGNOSIS — Z7189 Other specified counseling: Secondary | ICD-10-CM | POA: Diagnosis not present

## 2019-04-20 DIAGNOSIS — H409 Unspecified glaucoma: Secondary | ICD-10-CM | POA: Diagnosis present

## 2019-04-20 DIAGNOSIS — I6310 Cerebral infarction due to embolism of unspecified precerebral artery: Secondary | ICD-10-CM | POA: Diagnosis not present

## 2019-04-20 DIAGNOSIS — Z66 Do not resuscitate: Secondary | ICD-10-CM | POA: Diagnosis not present

## 2019-04-20 DIAGNOSIS — N390 Urinary tract infection, site not specified: Secondary | ICD-10-CM | POA: Diagnosis not present

## 2019-04-20 DIAGNOSIS — I6340 Cerebral infarction due to embolism of unspecified cerebral artery: Secondary | ICD-10-CM | POA: Diagnosis not present

## 2019-04-20 DIAGNOSIS — I482 Chronic atrial fibrillation, unspecified: Secondary | ICD-10-CM | POA: Diagnosis not present

## 2019-04-20 DIAGNOSIS — K449 Diaphragmatic hernia without obstruction or gangrene: Secondary | ICD-10-CM | POA: Diagnosis present

## 2019-04-20 DIAGNOSIS — Z20828 Contact with and (suspected) exposure to other viral communicable diseases: Secondary | ICD-10-CM | POA: Diagnosis not present

## 2019-04-20 DIAGNOSIS — G9341 Metabolic encephalopathy: Secondary | ICD-10-CM | POA: Diagnosis not present

## 2019-04-20 DIAGNOSIS — K219 Gastro-esophageal reflux disease without esophagitis: Secondary | ICD-10-CM | POA: Diagnosis present

## 2019-04-20 DIAGNOSIS — I739 Peripheral vascular disease, unspecified: Secondary | ICD-10-CM | POA: Diagnosis present

## 2019-04-20 DIAGNOSIS — I639 Cerebral infarction, unspecified: Secondary | ICD-10-CM | POA: Diagnosis not present

## 2019-04-20 DIAGNOSIS — R140 Abdominal distension (gaseous): Secondary | ICD-10-CM | POA: Diagnosis not present

## 2019-04-20 DIAGNOSIS — M109 Gout, unspecified: Secondary | ICD-10-CM | POA: Diagnosis not present

## 2019-04-20 LAB — ECHOCARDIOGRAM COMPLETE
Height: 62 in
Weight: 2342.17 oz

## 2019-04-20 LAB — CBC
HCT: 30.1 % — ABNORMAL LOW (ref 36.0–46.0)
Hemoglobin: 9.3 g/dL — ABNORMAL LOW (ref 12.0–15.0)
MCH: 28.2 pg (ref 26.0–34.0)
MCHC: 30.9 g/dL (ref 30.0–36.0)
MCV: 91.2 fL (ref 80.0–100.0)
Platelets: 171 10*3/uL (ref 150–400)
RBC: 3.3 MIL/uL — ABNORMAL LOW (ref 3.87–5.11)
RDW: 17.5 % — ABNORMAL HIGH (ref 11.5–15.5)
WBC: 6.4 10*3/uL (ref 4.0–10.5)
nRBC: 0 % (ref 0.0–0.2)

## 2019-04-20 LAB — URINALYSIS, ROUTINE W REFLEX MICROSCOPIC
Bacteria, UA: NONE SEEN
Bilirubin Urine: NEGATIVE
Glucose, UA: NEGATIVE mg/dL
Hgb urine dipstick: NEGATIVE
Ketones, ur: NEGATIVE mg/dL
Leukocytes,Ua: NEGATIVE
Nitrite: NEGATIVE
Protein, ur: 30 mg/dL — AB
Specific Gravity, Urine: 1.014 (ref 1.005–1.030)
pH: 6 (ref 5.0–8.0)

## 2019-04-20 LAB — BASIC METABOLIC PANEL
Anion gap: 10 (ref 5–15)
BUN: 34 mg/dL — ABNORMAL HIGH (ref 8–23)
CO2: 19 mmol/L — ABNORMAL LOW (ref 22–32)
Calcium: 9.7 mg/dL (ref 8.9–10.3)
Chloride: 117 mmol/L — ABNORMAL HIGH (ref 98–111)
Creatinine, Ser: 1.44 mg/dL — ABNORMAL HIGH (ref 0.44–1.00)
GFR calc Af Amer: 37 mL/min — ABNORMAL LOW (ref >60)
GFR calc non Af Amer: 32 mL/min — ABNORMAL LOW (ref >60)
Glucose, Bld: 85 mg/dL (ref 70–99)
Potassium: 4.2 mmol/L (ref 3.5–5.1)
Sodium: 146 mmol/L — ABNORMAL HIGH (ref 135–145)

## 2019-04-20 LAB — MRSA PCR SCREENING: MRSA by PCR: NEGATIVE

## 2019-04-20 LAB — PROTIME-INR
INR: 3.3 — ABNORMAL HIGH (ref 0.8–1.2)
Prothrombin Time: 33.2 seconds — ABNORMAL HIGH (ref 11.4–15.2)

## 2019-04-20 LAB — CK: Total CK: 228 U/L (ref 38–234)

## 2019-04-20 MED ORDER — LOSARTAN POTASSIUM 50 MG PO TABS: 50.0000 mg | ORAL_TABLET | Freq: Every day | ORAL | Status: DC

## 2019-04-20 MED ORDER — PHYTONADIONE 5 MG PO TABS
5.0000 mg | ORAL_TABLET | Freq: Once | ORAL | Status: AC
  Administered 2019-04-20: 5 mg via ORAL
  Filled 2019-04-20: qty 1

## 2019-04-20 MED ORDER — POLYETHYLENE GLYCOL 3350 17 G PO PACK
17.0000 g | PACK | Freq: Every day | ORAL | Status: DC
  Administered 2019-04-20 – 2019-04-22 (×3): 17 g via ORAL
  Filled 2019-04-20 (×4): qty 1

## 2019-04-20 MED ORDER — HYDRALAZINE HCL 20 MG/ML IJ SOLN
10.0000 mg | Freq: Four times a day (QID) | INTRAMUSCULAR | Status: DC | PRN
  Administered 2019-04-20: 10 mg via INTRAVENOUS
  Filled 2019-04-20: qty 1

## 2019-04-20 MED ORDER — SODIUM CHLORIDE 0.9 % IV SOLN
INTRAVENOUS | Status: DC
  Administered 2019-04-20: 16:00:00 via INTRAVENOUS

## 2019-04-20 NOTE — Progress Notes (Signed)
Pt became combative while PT worked with patient.  Physical therapy called son and son was concerned. Per son pt is not acting herself.  I assessed patient who seemed more unhappy/upset but not combative and was alert and oriented.  Physical therapy spoke will son and I followed up with son with my assessment findings.  I also communicated the above to Dr. Broadus John, MD.

## 2019-04-20 NOTE — Progress Notes (Signed)
Patient having episodes of bradycardia. Lowest was 39. See documentation from [place]. Patient sleeping and asymptomatic. Will continue to monitor

## 2019-04-20 NOTE — Plan of Care (Signed)
  Problem: Clinical Measurements: Goal: Will remain free from infection Outcome: Progressing Goal: Cardiovascular complication will be avoided Outcome: Progressing   

## 2019-04-20 NOTE — Evaluation (Signed)
Physical Therapy Evaluation Patient Details Name: Elizabeth Burch MRN: [id number] DOB: [birth year] Today's Date: 04/20/2019   History of Present Illness  Patient is a 85 y/o female presenting to the ED on 04/19/19 s/p fall. Past medical history significant for hypertension, CKD stage IV, history of DVT on Coumadin, and gout. CT head and cervical spine without contrast were without acute intracranial abnormalities or evidence for cervical spine fracture or dislocation. Portable chest x-ray showed multiple minimally displaced fractures of the left ribs without evidence of pneumothorax or pleural effusion.     Clinical Impression  Patient admitted with the above listed diagnosis. Patient seemingly confused throughout session and not oriented to place or situation. PT calling son prior to initiating movement as patient was fearful with son able to voice to her that PT was available to assist her. Patient resistive with mobility requiring Mod A +2 to roll and sit EOB. Once EOB patient able to progress balance to supervision from Mod A. Spoke with son and daughter in law following session with both stating they desired for her to return home and is currently looking for 24/hr supervision once discharged. However will currently recommend SNF at discharge as she requires increased physical assist. PT to continue to follow to progress mobility.    Family stating recent increase in falls, with most recent fall patient stating she felt as if her knee "gave out" - will continue to assess this with progressive mobility.    Follow Up Recommendations SNF;Supervision/Assistance - 24 hour    Equipment Recommendations  Other (comment)(TBD)    Recommendations for Other Services       Precautions / Restrictions Precautions Precautions: Fall Precaution Comments: rib fx Restrictions Weight Bearing Restrictions: No      Mobility  Bed Mobility Overal bed mobility: Needs Assistance Bed Mobility:  Rolling;Sidelying to Sit;Sit to Sidelying Rolling: Mod assist;+2 for physical assistance Sidelying to sit: Mod assist;+2 for physical assistance     Sit to sidelying: Mod assist;+2 for physical assistance General bed mobility comments: overall Mod A+2 however, patient resistive; limited ability to follow commands; PT spoke with son prior to session with son able to speak to patient to let her know that PT was here to assist her. Sat EOB ~2-3 min with Sup to Mod A   Transfers                 General transfer comment: unable   Ambulation/Gait                Stairs            Wheelchair Mobility    Modified Rankin (Stroke Patients Only)       Balance Overall balance assessment: Needs assistance Sitting-balance support: Bilateral upper extremity supported;Feet supported Sitting balance-Leahy Scale: Fair Sitting balance - Comments: Mod A to supervision - confusion likely limiting Postural control: Posterior lean;Right lateral lean                                   Pertinent Vitals/Pain Pain Assessment: Faces Faces Pain Scale: Hurts even more Pain Location: generalized Pain Descriptors / Indicators: Aching;Discomfort;Grimacing;Guarding Pain Intervention(s): Limited activity within patient's tolerance;Monitored during session;Repositioned    Home Living Family/patient expects to be discharged to:: Private residence Living Arrangements: Alone Available Help at Discharge: Family;Available PRN/intermittently Type of Home: House       Home Layout: Multi-level;Able to live on main level with bedroom/bathroom  Home Equipment: [place] - 2 wheels;Cane - single point      Prior Function Level of Independence: Independent         Comments: gardens, Interior and spatial designer with friends     Hand Dominance        Extremity/Trunk Assessment   Upper Extremity Assessment Upper Extremity Assessment: Defer to OT evaluation    Lower Extremity  Assessment Lower Extremity Assessment: Generalized weakness    Cervical / Trunk Assessment Cervical / Trunk Assessment: Kyphotic  Communication   Communication: No difficulties  Cognition Arousal/Alertness: Awake/alert Behavior During Therapy: Agitated Overall Cognitive Status: Impaired/Different from baseline Area of Impairment: Orientation;Following commands;Safety/judgement;Problem solving                 Orientation Level: Disoriented to;Place;Time;Situation     Following Commands: Follows one step commands inconsistently Safety/Judgement: Decreased awareness of safety;Decreased awareness of deficits   Problem Solving: Slow processing;Decreased initiation;Difficulty sequencing;Requires verbal cues;Requires tactile cues General Comments: spoke with son x2 - states patient is not in her usual state of mind, PT voicing these concerns to nursing staff      General Comments      Exercises     Assessment/Plan    PT Assessment Patient needs continued PT services  PT Problem List Decreased strength;Decreased activity tolerance;Decreased balance;Decreased mobility;Decreased knowledge of use of DME;Decreased safety awareness       PT Treatment Interventions DME instruction;Gait training;Stair training;Functional mobility training;Therapeutic activities;Therapeutic exercise;Balance training;Patient/family education    PT Goals (Current goals can be found in the Care Plan section)  Acute Rehab PT Goals Patient Stated Goal: none stated PT Goal Formulation: With patient Time For Goal Achievement: 05/04/19 Potential to Achieve Goals: Fair    Frequency Min 3X/week   Barriers to discharge Decreased caregiver support pt lives alone    Co-evaluation               AM-PAC PT "6 Clicks" Mobility  Outcome Measure Help needed turning from your back to your side while in a flat bed without using bedrails?: A Lot Help needed moving from lying on your back to sitting on  the side of a flat bed without using bedrails?: A Lot Help needed moving to and from a bed to a chair (including a wheelchair)?: Total Help needed standing up from a chair using your arms (e.g., wheelchair or bedside chair)?: Total Help needed to walk in hospital room?: Total Help needed climbing 3-5 steps with a railing? : Total 6 Click Score: 8    End of Session   Activity Tolerance: Treatment limited secondary to agitation Patient left: in bed;with call bell/phone within reach;with bed alarm set Nurse Communication: Mobility status PT Visit Diagnosis: Unsteadiness on feet (R26.81);Other abnormalities of gait and mobility (R26.89);Muscle weakness (generalized) (M62.81);History of falling (Z91.81)    Time: 8185-6314 PT Time Calculation (min) (ACUTE ONLY): 21 min   Charges:   PT Evaluation $PT Eval Moderate Complexity: 1 Mod           Lanney Gins, PT, DPT Supplemental Physical Therapist 04/20/19 3:03 PM Pager: [phone] Office: [phone]

## 2019-04-20 NOTE — Progress Notes (Signed)
Updated son by phone of patient condition this am.  Per son's request concern of UTI, notified provider Saverio Danker, PA who places order for UA.  Per Saverio Danker, PA's request placed Incentive spirometer in room, provided teaching, and encouraged use.

## 2019-04-20 NOTE — Progress Notes (Signed)
Patient ID: Elizabeth Burch, female   DOB: Apr 12, 1926, 85 y.o.   MRN: [id number]       Subjective: Patient is not a good historian.  Complains of abdominal pain, can't hardly move or sit up because it hurts so bad if she does.  Laying still she doesn't hurt much.  Some pain in her chest, but not bad.  No IS in her room yet.  She states she doesn't think she hit her abdomen when she fell yesterday.  She doesn't know if it was hurting before the fall.  She states it radiated in her right groin.  Admits to some back pain on both sides of her back.  Denies ever having a kidney stone.  RN states son asked for a UA to be drawn as patient hasn't been "herself" in the last 2 weeks since her fall.  I'm unclear if she has had a fall 2 weeks ago and then another one this week.  She tells me she feel on Tuesday she thinks.  Objective: Vital signs in last 24 hours: Temp:  [97.4 F (36.3 C)-98.2 F (36.8 C)] 98 F (36.7 C) (05/17 0729) Pulse Rate:  [54-74] 54 (05/17 0744) Resp:  [15-19] 18 (05/17 0744) BP: (149-174)/(51-87) 164/63 (05/17 0729) SpO2:  [96 %-100 %] 97 % (05/17 0744) Weight:  [66.4 kg-68 kg] 66.4 kg (05/16 2343) Last BM Date: 04/19/19  Intake/Output from previous day: 05/16 0701 - 05/17 0700 In: 1000.1 [IV Piggyback:1000.1] Out: -  Intake/Output this shift: No intake/output data recorded.  PE: Gen: NAD, laying in bed Heart: regular Lungs: CTAB, but some decrease at LLL, some chest Peri tenderness as expected Abd: seems distended in lower abdomen.  When she relaxes some she is somewhat soft, but otherwise tends to try and guard and feels tight.  She is tender across her lower abdomen.  She complains of horrible pain when she tries to sit up or move.  No bruising noted. Ext: moves all ext.  NVI   Lab Results:  Recent Labs    04/19/19 1830 04/20/19 0446  WBC 8.6 6.4  HGB 10.7* 9.3*  HCT 35.2* 30.1*  PLT 224 171   BMET Recent Labs    04/19/19 1830 04/20/19 0446  NA 145  146*  K 4.4 4.2  CL 115* 117*  CO2 20* 19*  GLUCOSE 107* 85  BUN 39* 34*  CREATININE 1.79* 1.44*  CALCIUM 10.2 9.7   PT/INR Recent Labs    04/20/19 0446  LABPROT 33.2*  INR 3.3*   CMP     Component Value Date/Time   NA 146 (H) 04/20/2019 0446   NA 145 (H) 01/06/2019 0926   K 4.2 04/20/2019 0446   CL 117 (H) 04/20/2019 0446   CO2 19 (L) 04/20/2019 0446   GLUCOSE 85 04/20/2019 0446   BUN 34 (H) 04/20/2019 0446   BUN 40 (H) 01/06/2019 0926   CREATININE 1.44 (H) 04/20/2019 0446   CREATININE 2.05 (H) 02/04/2014 0807   CALCIUM 9.7 04/20/2019 0446   PROT 6.7 04/19/2019 1830   PROT 6.5 01/01/2019 1107   ALBUMIN 3.5 04/19/2019 1830   ALBUMIN 4.2 01/01/2019 1107   AST 27 04/19/2019 1830   ALT 27 04/19/2019 1830   ALKPHOS 129 (H) 04/19/2019 1830   BILITOT 0.5 04/19/2019 1830   BILITOT <0.2 01/01/2019 1107   GFRNONAA 32 (L) 04/20/2019 0446   GFRNONAA 22 (L) 02/04/2014 0807   GFRAA 37 (L) 04/20/2019 0446   GFRAA 25 (L) 02/04/2014 1025  Lipase     Component Value Date/Time   LIPASE 37 10/27/2016 1414       Studies/Results: Ct Abdomen Wo Contrast  Result Date: 04/19/2019 CLINICAL DATA:  85 year old post fall this morning. Rib fractures on radiograph. EXAM: CT CHEST, ABDOMEN AND PELVIS WITHOUT CONTRAST TECHNIQUE: Multidetector CT imaging of the chest, abdomen and pelvis was performed following the standard protocol without IV contrast. COMPARISON:  Chest radiograph earlier this day. FINDINGS: CT CHEST FINDINGS Cardiovascular: Aortic atherosclerosis. No periaortic stranding to suggest injury. Mild cardiomegaly. There are coronary artery calcifications. No pericardial effusion. Mediastinum/Nodes: Mediastinal hemorrhage or hematoma. No pneumomediastinum. The esophagus is decompressed. Slightly heterogeneous thyroid gland without dominant nodule. Evidence of mediastinal adenopathy, lack of IV contrast limits detailed assessment. Lungs/Pleura: No pneumothorax. Evidence pulmonary  contusion. Trace left pleural thickening/effusion and atelectasis the lower lobe. No right pleural fluid. Linear atelectasis or scarring in the anterior right upper lobe and right middle lobe. Calcified granuloma in the right middle lobe. No pulmonary edema. Musculoskeletal: Acute anterolateral minimally displaced left fifth through ninth rib fractures. Remote sternal fracture. Remote T6 compression fracture. Included shoulder girdles and clavicles are intact. No confluent chest Abaya contusion. CT ABDOMEN PELVIS FINDINGS Hepatobiliary: Lack of IV contrast limits assessment for injury. Allowing for this, no evidence of hepatic injury. 11 mm simple cyst in the left lobe of the liver. No perihepatic hematoma. Gallbladder is unremarkable. Pancreas: No evidence of injury. No ductal dilatation or inflammation. Spleen: Lack of IV contrast limits detailed assessment. Allowing for this, no evidence of injury. No perisplenic hematoma. Adrenals/Urinary Tract: No adrenal hemorrhage or renal injury identified. Bilateral renal parenchymal thinning. Cortical scarring in the upper right kidney. Bladder is unremarkable. Stomach/Bowel: Small hiatal hernia. Stomach nondistended. No bowel Vanatta thickening or inflammatory change. No evidence of bowel injury. No mesenteric hematoma. Sutures at the base of the cecum from presumed appendectomy. Distal colonic diverticulosis without diverticulitis. Vascular/Lymphatic: Aortic atherosclerosis without aneurysm. No retroperitoneal fluid or stranding. No adenopathy. Reproductive: Status post hysterectomy. No adnexal masses. Other: No free fluid or free air. No confluent body Morriss contusion. Musculoskeletal: No fracture of the pelvis or lumbar spine. IMPRESSION: 1. Acute anterolateral minimally displaced left fifth through ninth rib fractures. No pneumothorax. Trace left pleural effusion/pleural thickening. 2. No additional acute traumatic injury to the chest, abdomen, or pelvis. 3. Remote sternal  fracture.  Remote T6 compression fracture. 4. Coronary artery calcifications. Aortic Atherosclerosis (ICD10-I70.0). Electronically Signed   By: Keith Rake M.D.   On: 04/19/2019 21:20   Dg Chest 1 View  Result Date: 04/19/2019 CLINICAL DATA:  Fall EXAM: CHEST  1 VIEW COMPARISON:  12/29/2016 FINDINGS: Cardiomegaly. Both lungs are clear. Multiple minimally displaced fractures of the left ribs. IMPRESSION: 1. Multiple minimally displaced fractures of the left ribs. No pneumothorax or pleural effusion is appreciated. 2.  Cardiomegaly. Electronically Signed   By: Eddie Candle M.D.   On: 04/19/2019 19:38   Dg Pelvis 1-2 Views  Result Date: 04/19/2019 CLINICAL DATA:  Fall EXAM: PELVIS - 1-2 VIEW COMPARISON:  None. FINDINGS: Marked osteopenia. No obvious displaced fracture of the pelvis or bilateral proximal femurs. IMPRESSION: Marked osteopenia. No obvious displaced fracture of the pelvis or bilateral proximal femurs. Please note that plain radiographs are significantly limited for hip and pelvic fracture in the setting of osteopenia; recommend MRI to more sensitively evaluate for marrow edema and fracture if suspected. Electronically Signed   By: Eddie Candle M.D.   On: 04/19/2019 19:40   Ct Head Wo Contrast  Result Date: 04/19/2019 CLINICAL DATA:  Unwitnessed fall. EXAM: CT HEAD WITHOUT CONTRAST CT CERVICAL SPINE WITHOUT CONTRAST TECHNIQUE: Multidetector CT imaging of the head and cervical spine was performed following the standard protocol without intravenous contrast. Multiplanar CT image reconstructions of the cervical spine were also generated. COMPARISON:  None. FINDINGS: CT HEAD FINDINGS Brain: No evidence of acute infarction, hemorrhage, hydrocephalus, extra-axial collection or mass lesion/mass effect. There is mild diffuse low-attenuation within the subcortical and periventricular white matter compatible with chronic microvascular disease. Vascular: No hyperdense vessel or unexpected  calcification. Skull: Normal. Negative for fracture or focal lesion. Sinuses/Orbits: Mild mucosal thickening involves the maxillary sinuses. Other: None. CT CERVICAL SPINE FINDINGS Alignment: Normal alignment of the cervical spine. Skull base and vertebrae: No acute fracture. No primary bone lesion or focal pathologic process. Soft tissues and spinal canal: No prevertebral fluid or swelling. No visible canal hematoma. Disc levels: Mild multi level ventral endplate spurring noted. Disc spaces are relatively well preserved. Upper chest: Negative. Other: None IMPRESSION: 1. No acute intracranial abnormalities. 2. Mild chronic small vessel ischemic change. 3. No evidence for cervical spine fracture or dislocation. Electronically Signed   By: Kerby Moors M.D.   On: 04/19/2019 19:25   Ct Chest Wo Contrast  Result Date: 04/19/2019 CLINICAL DATA:  85 year old post fall this morning. Rib fractures on radiograph. EXAM: CT CHEST, ABDOMEN AND PELVIS WITHOUT CONTRAST TECHNIQUE: Multidetector CT imaging of the chest, abdomen and pelvis was performed following the standard protocol without IV contrast. COMPARISON:  Chest radiograph earlier this day. FINDINGS: CT CHEST FINDINGS Cardiovascular: Aortic atherosclerosis. No periaortic stranding to suggest injury. Mild cardiomegaly. There are coronary artery calcifications. No pericardial effusion. Mediastinum/Nodes: Mediastinal hemorrhage or hematoma. No pneumomediastinum. The esophagus is decompressed. Slightly heterogeneous thyroid gland without dominant nodule. Evidence of mediastinal adenopathy, lack of IV contrast limits detailed assessment. Lungs/Pleura: No pneumothorax. Evidence pulmonary contusion. Trace left pleural thickening/effusion and atelectasis the lower lobe. No right pleural fluid. Linear atelectasis or scarring in the anterior right upper lobe and right middle lobe. Calcified granuloma in the right middle lobe. No pulmonary edema. Musculoskeletal: Acute  anterolateral minimally displaced left fifth through ninth rib fractures. Remote sternal fracture. Remote T6 compression fracture. Included shoulder girdles and clavicles are intact. No confluent chest Winner contusion. CT ABDOMEN PELVIS FINDINGS Hepatobiliary: Lack of IV contrast limits assessment for injury. Allowing for this, no evidence of hepatic injury. 11 mm simple cyst in the left lobe of the liver. No perihepatic hematoma. Gallbladder is unremarkable. Pancreas: No evidence of injury. No ductal dilatation or inflammation. Spleen: Lack of IV contrast limits detailed assessment. Allowing for this, no evidence of injury. No perisplenic hematoma. Adrenals/Urinary Tract: No adrenal hemorrhage or renal injury identified. Bilateral renal parenchymal thinning. Cortical scarring in the upper right kidney. Bladder is unremarkable. Stomach/Bowel: Small hiatal hernia. Stomach nondistended. No bowel Nish thickening or inflammatory change. No evidence of bowel injury. No mesenteric hematoma. Sutures at the base of the cecum from presumed appendectomy. Distal colonic diverticulosis without diverticulitis. Vascular/Lymphatic: Aortic atherosclerosis without aneurysm. No retroperitoneal fluid or stranding. No adenopathy. Reproductive: Status post hysterectomy. No adnexal masses. Other: No free fluid or free air. No confluent body Cavazos contusion. Musculoskeletal: No fracture of the pelvis or lumbar spine. IMPRESSION: 1. Acute anterolateral minimally displaced left fifth through ninth rib fractures. No pneumothorax. Trace left pleural effusion/pleural thickening. 2. No additional acute traumatic injury to the chest, abdomen, or pelvis. 3. Remote sternal fracture.  Remote T6 compression fracture. 4. Coronary artery calcifications. Aortic  Atherosclerosis (ICD10-I70.0). Electronically Signed   By: Keith Rake M.D.   On: 04/19/2019 21:20   Ct Cervical Spine Wo Contrast  Result Date: 04/19/2019 CLINICAL DATA:  Unwitnessed  fall. EXAM: CT HEAD WITHOUT CONTRAST CT CERVICAL SPINE WITHOUT CONTRAST TECHNIQUE: Multidetector CT imaging of the head and cervical spine was performed following the standard protocol without intravenous contrast. Multiplanar CT image reconstructions of the cervical spine were also generated. COMPARISON:  None. FINDINGS: CT HEAD FINDINGS Brain: No evidence of acute infarction, hemorrhage, hydrocephalus, extra-axial collection or mass lesion/mass effect. There is mild diffuse low-attenuation within the subcortical and periventricular white matter compatible with chronic microvascular disease. Vascular: No hyperdense vessel or unexpected calcification. Skull: Normal. Negative for fracture or focal lesion. Sinuses/Orbits: Mild mucosal thickening involves the maxillary sinuses. Other: None. CT CERVICAL SPINE FINDINGS Alignment: Normal alignment of the cervical spine. Skull base and vertebrae: No acute fracture. No primary bone lesion or focal pathologic process. Soft tissues and spinal canal: No prevertebral fluid or swelling. No visible canal hematoma. Disc levels: Mild multi level ventral endplate spurring noted. Disc spaces are relatively well preserved. Upper chest: Negative. Other: None IMPRESSION: 1. No acute intracranial abnormalities. 2. Mild chronic small vessel ischemic change. 3. No evidence for cervical spine fracture or dislocation. Electronically Signed   By: Kerby Moors M.D.   On: 04/19/2019 19:25    Anti-infectives: Anti-infectives (From admission, onward)   None       Assessment/Plan Fall Left 5-9 rib FX - pulm toilet, IS, mobilize with PT/OT Abdominal pain - unclear as CT yesterday with no abnormalities.  Given persistent pain and complaints of pain, will check a port film to rule out free air or anything concerning.  WBC normal, vitals are normal, would not expect concerning findings, but very unclear about her abdominal pain.  No nephrolithiasis was noted on her CT scan yesterday and  never had one before.  I did not feel a hernia in either groin. ? Dementia - patient is a poor historian to details of the events from the past week or so.  Per RN son wants a UA to rule out UTI as she has not been herself in the last 2 weeks since apparently another fall... H/O DVT - on coumadin at home.  On hold currently, INR 3.3.  hgb 9 from 10, relatively stable. CKD - per medicine HTN - per medicine  FEN - regular diet VTE - coumadin on hold ID - none   LOS: 0 days    Henreitta Cea , [facility] Surgery 04/20/2019, 8:55 AM Pager: [phone]

## 2019-04-20 NOTE — Progress Notes (Signed)
Patient arrived from ED to unit. Patient transferred to bed, VSS, heart monitor placed, CCMD notified. Patient oriented to room, call bell within reach

## 2019-04-20 NOTE — Progress Notes (Signed)
Notified by CCMD that patient had 2.04 pause at 0100. Patient sleeping and asymptomatic. Will continue to monitor

## 2019-04-20 NOTE — Progress Notes (Addendum)
PROGRESS NOTE    Elizabeth Burch  NIO:[id number] DOB: 1925/12/25 DOA: 04/19/2019 PCP: Chipper Herb, MD  Brief Narrative: This is a 85 year old female with history of stage IV kidney disease, history of DVT on Coumadin, gout, hypertension who lives independently at home was admitted following a fall, patient cannot recall the events preceding the fall, syncope suspected. -In the emergency room labs noted creatinine of 1.8, SARS COVID-19 PCR was negative, chest x-ray noted multiple minimally displaced fractures of left ribs 5 through 9. -CT abdomen pelvis did not show any other abnormality, x-rays of her pelvis were unremarkable  Assessment & Plan:   Multiple left rib fractures 5- 9 -Minimally displaced on x-ray -Added incentive spirometer, encouraged Tylenol use -Due to advanced age and anticoagulation she is at risk for pneumonia, hemothorax etc. -im especially concerned abt anticoagulation in this setting and will hold coumadin for few weeks, fortunately her DVT was remote, last Dopplers in 2014 showed chronic DVT -Discussed risks and benefits of holding anticoagulation with patient and son [name] -Trauma service following -not safe to discharge home at this time  Fall/frequent falls -Possible syncope -Monitor on telemetry -Follow-up echocardiogram -PT OT evaluation  Abdominal distention -With decreased bowel sounds but no nausea or vomiting until now -CT abdomen pelvis yesterday was unremarkable, KUB ordered per [facility] will follow-up -Monitor for symptoms  History of DVT on chronic Coumadin -CT head unremarkable, history of multiple falls recently -Last Doppler 2014 with chronic DVT -See discussion above will hold anticoagulation for at least 1 to 2 weeks in the setting of above rib fractures and risk of hemothorax etc.  Stage IV chronic kidney disease -Stable, monitor  Hypertension -Continue amlodipine Toprol and losartan at a lower dose  Gout -Stable  DVT  prophylaxis: INR supratherapeutic, hold Coumadin Code Status: DNR Family Communication: Called and updated son [name] Disposition Plan: To be determined  Consultants:   Trauma surgery   Procedures:   Antimicrobials:    Subjective: -Feels uncomfortable, hurts all over -Has not been able to move at all due to diffuse pain  Objective: Vitals:   04/19/19 2343 04/20/19 0441 04/20/19 0729 04/20/19 0744  BP: (!) 164/57 (!) 156/54 (!) 164/63   Pulse: 67 66 74 (!) 54  Resp: 18 15 19 18   Temp: (!) 97.4 F (36.3 C) 98.2 F (36.8 C) 98 F (36.7 C)   TempSrc: Oral Oral Oral   SpO2: 100% 98% 96% 97%  Weight: 66.4 kg     Height: 5\' 2"  (1.575 m)       Intake/Output Summary (Last 24 hours) at 04/20/2019 1043 Last data filed at 04/20/2019 0301 Gross per 24 hour  Intake 1000.12 ml  Output --  Net 1000.12 ml   Filed Weights   04/19/19 1823 04/19/19 2343  Weight: 68 kg 66.4 kg    Examination:  General exam: Early female, laying in bed, uncomfortable appearing, awake alert oriented x3 Respiratory system: Decreased breath sounds at both bases  cardiovascular system: S1 & S2 heard, RRR. Gastrointestinal system: Abdomen is firm, distended, bowel sounds decreased  Central nervous system: Alert and oriented. No focal neurological deficits. Extremities: No edema, painful range of motion especially right hip. Skin: No rashes, lesions or ulcers Psychiatry: Judgement and insight appear normal. Mood & affect appropriate.     Data Reviewed:   CBC: Recent Labs  Lab 04/19/19 1830 04/20/19 0446  WBC 8.6 6.4  NEUTROABS 6.4  --   HGB 10.7* 9.3*  HCT 35.2* 30.1*  MCV  91.9 91.2  PLT 224 176   Basic Metabolic Panel: Recent Labs  Lab 04/19/19 1830 04/20/19 0446  NA 145 146*  K 4.4 4.2  CL 115* 117*  CO2 20* 19*  GLUCOSE 107* 85  BUN 39* 34*  CREATININE 1.79* 1.44*  CALCIUM 10.2 9.7   GFR: Estimated Creatinine Clearance: 23.2 mL/min (A) (by C-G formula based on SCr of  1.44 mg/dL (H)). Liver Function Tests: Recent Labs  Lab 04/19/19 1830  AST 27  ALT 27  ALKPHOS 129*  BILITOT 0.5  PROT 6.7  ALBUMIN 3.5   No results for input(s): LIPASE, AMYLASE in the last 168 hours. No results for input(s): AMMONIA in the last 168 hours. Coagulation Profile: Recent Labs  Lab 04/16/19 0914 04/20/19 0446  INR 2.7* 3.3*   Cardiac Enzymes: Recent Labs  Lab 04/19/19 1830 04/20/19 0446  CKTOTAL 249* 228   BNP (last 3 results) No results for input(s): PROBNP in the last 8760 hours. HbA1C: No results for input(s): HGBA1C in the last 72 hours. CBG: No results for input(s): GLUCAP in the last 168 hours. Lipid Profile: No results for input(s): CHOL, HDL, LDLCALC, TRIG, CHOLHDL, LDLDIRECT in the last 72 hours. Thyroid Function Tests: No results for input(s): TSH, T4TOTAL, FREET4, T3FREE, THYROIDAB in the last 72 hours. Anemia Panel: No results for input(s): VITAMINB12, FOLATE, FERRITIN, TIBC, IRON, RETICCTPCT in the last 72 hours. Urine analysis:    Component Value Date/Time   COLORURINE AMBER (A) 10/27/2016 2120   APPEARANCEUR CLEAR 10/27/2016 2120   LABSPEC 1.017 10/27/2016 2120   PHURINE 6.5 10/27/2016 2120   GLUCOSEU NEGATIVE 10/27/2016 2120   HGBUR SMALL (A) 10/27/2016 2120   BILIRUBINUR NEGATIVE 10/27/2016 2120   BILIRUBINUR neg 09/22/2014 1012   KETONESUR NEGATIVE 10/27/2016 2120   PROTEINUR 100 (A) 10/27/2016 2120   UROBILINOGEN 0.2 10/15/2014 1144   NITRITE NEGATIVE 10/27/2016 2120   LEUKOCYTESUR NEGATIVE 10/27/2016 2120   Sepsis Labs: @LABRCNTIP (procalcitonin:4,lacticidven:4)  ) Recent Results (from the past 240 hour(s))  SARS Coronavirus 2 (CEPHEID - Performed in [place] hospital lab), Hosp Order     Status: None   Collection Time: 04/19/19  9:33 PM  Result Value Ref Range Status   SARS Coronavirus 2 NEGATIVE NEGATIVE Final    Comment: (NOTE) If result is NEGATIVE SARS-CoV-2 target nucleic acids are NOT DETECTED. The  SARS-CoV-2 RNA is generally detectable in upper and lower  respiratory specimens during the acute phase of infection. The lowest  concentration of SARS-CoV-2 viral copies this assay can detect is 250  copies / mL. A negative result does not preclude SARS-CoV-2 infection  and should not be used as the sole basis for treatment or other  patient management decisions.  A negative result may occur with  improper specimen collection / handling, submission of specimen other  than nasopharyngeal swab, presence of viral mutation(s) within the  areas targeted by this assay, and inadequate number of viral copies  (<250 copies / mL). A negative result must be combined with clinical  observations, patient history, and epidemiological information. If result is POSITIVE SARS-CoV-2 target nucleic acids are DETECTED. The SARS-CoV-2 RNA is generally detectable in upper and lower  respiratory specimens dur ing the acute phase of infection.  Positive  results are indicative of active infection with SARS-CoV-2.  Clinical  correlation with patient history and other diagnostic information is  necessary to determine patient infection status.  Positive results do  not rule out bacterial infection or co-infection with other viruses. If  result is PRESUMPTIVE POSTIVE SARS-CoV-2 nucleic acids MAY BE PRESENT.   A presumptive positive result was obtained on the submitted specimen  and confirmed on repeat testing.  While 2019 novel coronavirus  (SARS-CoV-2) nucleic acids may be present in the submitted sample  additional confirmatory testing may be necessary for epidemiological  and / or clinical management purposes  to differentiate between  SARS-CoV-2 and other Sarbecovirus currently known to infect humans.  If clinically indicated additional testing with an alternate test  methodology [phone]) is advised. The SARS-CoV-2 RNA is generally  detectable in upper and lower respiratory sp ecimens during the acute    phase of infection. The expected result is Negative. Fact Sheet for Patients:  StrictlyIdeas.no Fact Sheet for Healthcare Providers: BankingDealers.co.za This test is not yet approved or cleared by the [place] FDA and has been authorized for detection and/or diagnosis of SARS-CoV-2 by FDA under an Emergency Use Authorization (EUA).  This EUA will remain in effect (meaning this test can be used) for the duration of the COVID-19 declaration under Section 564(b)(1) of the Act, 21 U.S.C. section 360bbb-3(b)(1), unless the authorization is terminated or revoked sooner. Performed at [facility], [place] [address] [place], [place] [address]          Radiology Studies: Ct Abdomen Wo Contrast  Result Date: 04/19/2019 CLINICAL DATA:  85 year old post fall this morning. Rib fractures on radiograph. EXAM: CT CHEST, ABDOMEN AND PELVIS WITHOUT CONTRAST TECHNIQUE: Multidetector CT imaging of the chest, abdomen and pelvis was performed following the standard protocol without IV contrast. COMPARISON:  Chest radiograph earlier this day. FINDINGS: CT CHEST FINDINGS Cardiovascular: Aortic atherosclerosis. No periaortic stranding to suggest injury. Mild cardiomegaly. There are coronary artery calcifications. No pericardial effusion. Mediastinum/Nodes: Mediastinal hemorrhage or hematoma. No pneumomediastinum. The esophagus is decompressed. Slightly heterogeneous thyroid gland without dominant nodule. Evidence of mediastinal adenopathy, lack of IV contrast limits detailed assessment. Lungs/Pleura: No pneumothorax. Evidence pulmonary contusion. Trace left pleural thickening/effusion and atelectasis the lower lobe. No right pleural fluid. Linear atelectasis or scarring in the anterior right upper lobe and right middle lobe. Calcified granuloma in the right middle lobe. No pulmonary edema. Musculoskeletal: Acute anterolateral minimally displaced left fifth  through ninth rib fractures. Remote sternal fracture. Remote T6 compression fracture. Included shoulder girdles and clavicles are intact. No confluent chest [place] contusion. CT ABDOMEN PELVIS FINDINGS Hepatobiliary: Lack of IV contrast limits assessment for injury. Allowing for this, no evidence of hepatic injury. 11 mm simple cyst in the left lobe of the liver. No perihepatic hematoma. Gallbladder is unremarkable. Pancreas: No evidence of injury. No ductal dilatation or inflammation. Spleen: Lack of IV contrast limits detailed assessment. Allowing for this, no evidence of injury. No perisplenic hematoma. Adrenals/Urinary Tract: No adrenal hemorrhage or renal injury identified. Bilateral renal parenchymal thinning. Cortical scarring in the upper right kidney. Bladder is unremarkable. Stomach/Bowel: Small hiatal hernia. Stomach nondistended. No bowel Stretch thickening or inflammatory change. No evidence of bowel injury. No mesenteric hematoma. Sutures at the base of the cecum from presumed appendectomy. Distal colonic diverticulosis without diverticulitis. Vascular/Lymphatic: Aortic atherosclerosis without aneurysm. No retroperitoneal fluid or stranding. No adenopathy. Reproductive: Status post hysterectomy. No adnexal masses. Other: No free fluid or free air. No confluent body [place] contusion. Musculoskeletal: No fracture of the pelvis or lumbar spine. IMPRESSION: 1. Acute anterolateral minimally displaced left fifth through ninth rib fractures. No pneumothorax. Trace left pleural effusion/pleural thickening. 2. No additional acute traumatic injury to the chest, abdomen, or pelvis. 3. Remote  sternal fracture.  Remote T6 compression fracture. 4. Coronary artery calcifications. Aortic Atherosclerosis (ICD10-I70.0). Electronically Signed   By: Keith Rake M.D.   On: 04/19/2019 21:20   Dg Chest 1 View  Result Date: 04/19/2019 CLINICAL DATA:  Fall EXAM: CHEST  1 VIEW COMPARISON:  12/29/2016 FINDINGS: Cardiomegaly.  Both lungs are clear. Multiple minimally displaced fractures of the left ribs. IMPRESSION: 1. Multiple minimally displaced fractures of the left ribs. No pneumothorax or pleural effusion is appreciated. 2.  Cardiomegaly. Electronically Signed   By: Eddie Candle M.D.   On: 04/19/2019 19:38   Dg Pelvis 1-2 Views  Result Date: 04/19/2019 CLINICAL DATA:  Fall EXAM: PELVIS - 1-2 VIEW COMPARISON:  None. FINDINGS: Marked osteopenia. No obvious displaced fracture of the pelvis or bilateral proximal femurs. IMPRESSION: Marked osteopenia. No obvious displaced fracture of the pelvis or bilateral proximal femurs. Please note that plain radiographs are significantly limited for hip and pelvic fracture in the setting of osteopenia; recommend MRI to more sensitively evaluate for marrow edema and fracture if suspected. Electronically Signed   By: Eddie Candle M.D.   On: 04/19/2019 19:40   Ct Head Wo Contrast  Result Date: 04/19/2019 CLINICAL DATA:  Unwitnessed fall. EXAM: CT HEAD WITHOUT CONTRAST CT CERVICAL SPINE WITHOUT CONTRAST TECHNIQUE: Multidetector CT imaging of the head and cervical spine was performed following the standard protocol without intravenous contrast. Multiplanar CT image reconstructions of the cervical spine were also generated. COMPARISON:  None. FINDINGS: CT HEAD FINDINGS Brain: No evidence of acute infarction, hemorrhage, hydrocephalus, extra-axial collection or mass lesion/mass effect. There is mild diffuse low-attenuation within the subcortical and periventricular white matter compatible with chronic microvascular disease. Vascular: No hyperdense vessel or unexpected calcification. Skull: Normal. Negative for fracture or focal lesion. Sinuses/Orbits: Mild mucosal thickening involves the maxillary sinuses. Other: None. CT CERVICAL SPINE FINDINGS Alignment: Normal alignment of the cervical spine. Skull base and vertebrae: No acute fracture. No primary bone lesion or focal pathologic process. Soft  tissues and spinal canal: No prevertebral fluid or swelling. No visible canal hematoma. Disc levels: Mild multi level ventral endplate spurring noted. Disc spaces are relatively well preserved. Upper chest: Negative. Other: None IMPRESSION: 1. No acute intracranial abnormalities. 2. Mild chronic small vessel ischemic change. 3. No evidence for cervical spine fracture or dislocation. Electronically Signed   By: Kerby Moors M.D.   On: 04/19/2019 19:25   Ct Chest Wo Contrast  Result Date: 04/19/2019 CLINICAL DATA:  85 year old post fall this morning. Rib fractures on radiograph. EXAM: CT CHEST, ABDOMEN AND PELVIS WITHOUT CONTRAST TECHNIQUE: Multidetector CT imaging of the chest, abdomen and pelvis was performed following the standard protocol without IV contrast. COMPARISON:  Chest radiograph earlier this day. FINDINGS: CT CHEST FINDINGS Cardiovascular: Aortic atherosclerosis. No periaortic stranding to suggest injury. Mild cardiomegaly. There are coronary artery calcifications. No pericardial effusion. Mediastinum/Nodes: Mediastinal hemorrhage or hematoma. No pneumomediastinum. The esophagus is decompressed. Slightly heterogeneous thyroid gland without dominant nodule. Evidence of mediastinal adenopathy, lack of IV contrast limits detailed assessment. Lungs/Pleura: No pneumothorax. Evidence pulmonary contusion. Trace left pleural thickening/effusion and atelectasis the lower lobe. No right pleural fluid. Linear atelectasis or scarring in the anterior right upper lobe and right middle lobe. Calcified granuloma in the right middle lobe. No pulmonary edema. Musculoskeletal: Acute anterolateral minimally displaced left fifth through ninth rib fractures. Remote sternal fracture. Remote T6 compression fracture. Included shoulder girdles and clavicles are intact. No confluent chest [place] contusion. CT ABDOMEN PELVIS FINDINGS Hepatobiliary: Lack of IV contrast limits  assessment for injury. Allowing for this, no evidence  of hepatic injury. 11 mm simple cyst in the left lobe of the liver. No perihepatic hematoma. Gallbladder is unremarkable. Pancreas: No evidence of injury. No ductal dilatation or inflammation. Spleen: Lack of IV contrast limits detailed assessment. Allowing for this, no evidence of injury. No perisplenic hematoma. Adrenals/Urinary Tract: No adrenal hemorrhage or renal injury identified. Bilateral renal parenchymal thinning. Cortical scarring in the upper right kidney. Bladder is unremarkable. Stomach/Bowel: Small hiatal hernia. Stomach nondistended. No bowel [place] thickening or inflammatory change. No evidence of bowel injury. No mesenteric hematoma. Sutures at the base of the cecum from presumed appendectomy. Distal colonic diverticulosis without diverticulitis. Vascular/Lymphatic: Aortic atherosclerosis without aneurysm. No retroperitoneal fluid or stranding. No adenopathy. Reproductive: Status post hysterectomy. No adnexal masses. Other: No free fluid or free air. No confluent body [place] contusion. Musculoskeletal: No fracture of the pelvis or lumbar spine. IMPRESSION: 1. Acute anterolateral minimally displaced left fifth through ninth rib fractures. No pneumothorax. Trace left pleural effusion/pleural thickening. 2. No additional acute traumatic injury to the chest, abdomen, or pelvis. 3. Remote sternal fracture.  Remote T6 compression fracture. 4. Coronary artery calcifications. Aortic Atherosclerosis (ICD10-I70.0). Electronically Signed   By: Keith Rake M.D.   On: 04/19/2019 21:20   Ct Cervical Spine Wo Contrast  Result Date: 04/19/2019 CLINICAL DATA:  Unwitnessed fall. EXAM: CT HEAD WITHOUT CONTRAST CT CERVICAL SPINE WITHOUT CONTRAST TECHNIQUE: Multidetector CT imaging of the head and cervical spine was performed following the standard protocol without intravenous contrast. Multiplanar CT image reconstructions of the cervical spine were also generated. COMPARISON:  None. FINDINGS: CT HEAD FINDINGS  Brain: No evidence of acute infarction, hemorrhage, hydrocephalus, extra-axial collection or mass lesion/mass effect. There is mild diffuse low-attenuation within the subcortical and periventricular white matter compatible with chronic microvascular disease. Vascular: No hyperdense vessel or unexpected calcification. Skull: Normal. Negative for fracture or focal lesion. Sinuses/Orbits: Mild mucosal thickening involves the maxillary sinuses. Other: None. CT CERVICAL SPINE FINDINGS Alignment: Normal alignment of the cervical spine. Skull base and vertebrae: No acute fracture. No primary bone lesion or focal pathologic process. Soft tissues and spinal canal: No prevertebral fluid or swelling. No visible canal hematoma. Disc levels: Mild multi level ventral endplate spurring noted. Disc spaces are relatively well preserved. Upper chest: Negative. Other: None IMPRESSION: 1. No acute intracranial abnormalities. 2. Mild chronic small vessel ischemic change. 3. No evidence for cervical spine fracture or dislocation. Electronically Signed   By: Kerby Moors M.D.   On: 04/19/2019 19:25        Scheduled Meds:  amLODipine  10 mg Oral Daily   febuxostat  40 mg Oral Daily   losartan  100 mg Oral Daily   metoprolol succinate  37.5 mg Oral Daily   polyethylene glycol  17 g Oral Daily   Continuous Infusions:   LOS: 0 days    Time spent: 35min    Domenic Polite, MD [facility]   04/20/2019, 10:43 AM

## 2019-04-21 DIAGNOSIS — R140 Abdominal distension (gaseous): Secondary | ICD-10-CM

## 2019-04-21 LAB — CBC
HCT: 32.9 % — ABNORMAL LOW (ref 36.0–46.0)
Hemoglobin: 9.9 g/dL — ABNORMAL LOW (ref 12.0–15.0)
MCH: 27.7 pg (ref 26.0–34.0)
MCHC: 30.1 g/dL (ref 30.0–36.0)
MCV: 92.2 fL (ref 80.0–100.0)
Platelets: 220 10*3/uL (ref 150–400)
RBC: 3.57 MIL/uL — ABNORMAL LOW (ref 3.87–5.11)
RDW: 17.7 % — ABNORMAL HIGH (ref 11.5–15.5)
WBC: 9.4 10*3/uL (ref 4.0–10.5)
nRBC: 0 % (ref 0.0–0.2)

## 2019-04-21 LAB — PROTIME-INR
INR: 1.7 — ABNORMAL HIGH (ref 0.8–1.2)
Prothrombin Time: 19.9 seconds — ABNORMAL HIGH (ref 11.4–15.2)

## 2019-04-21 LAB — BASIC METABOLIC PANEL
Anion gap: 10 (ref 5–15)
BUN: 29 mg/dL — ABNORMAL HIGH (ref 8–23)
CO2: 19 mmol/L — ABNORMAL LOW (ref 22–32)
Calcium: 10.2 mg/dL (ref 8.9–10.3)
Chloride: 120 mmol/L — ABNORMAL HIGH (ref 98–111)
Creatinine, Ser: 1.36 mg/dL — ABNORMAL HIGH (ref 0.44–1.00)
GFR calc Af Amer: 40 mL/min — ABNORMAL LOW (ref >60)
GFR calc non Af Amer: 34 mL/min — ABNORMAL LOW (ref >60)
Glucose, Bld: 97 mg/dL (ref 70–99)
Potassium: 4.2 mmol/L (ref 3.5–5.1)
Sodium: 149 mmol/L — ABNORMAL HIGH (ref 135–145)

## 2019-04-21 MED ORDER — DEXTROSE 5 % IV SOLN
INTRAVENOUS | Status: DC
  Administered 2019-04-21 – 2019-04-30 (×12): via INTRAVENOUS

## 2019-04-21 NOTE — Evaluation (Signed)
Occupational Therapy Evaluation Patient Details Name: Elizabeth Burch MRN: [id number] DOB: December 31, 1925 Today's Date: 04/21/2019    History of Present Illness Patient is a 85 y/o female presenting to the ED on 04/19/19 s/p fall. Past medical history significant for hypertension, CKD stage IV, history of DVT on Coumadin, and gout. CT head and cervical spine without contrast were without acute intracranial abnormalities or evidence for cervical spine fracture or dislocation. Portable chest x-ray showed multiple minimally displaced fractures of the left ribs without evidence of pneumothorax or pleural effusion.    Clinical Impression   Limited evaluation due to pt with agitation and poor tolerance of movement. Presents with impaired cognition, pain and generalized weakness. Pt requires total assist for ADL. Recommending SNF upon discharge. Will follow acutely.    Follow Up Recommendations  SNF;Supervision/Assistance - 24 hour    Equipment Recommendations  Hospital bed;Wheelchair (measurements OT);Wheelchair cushion (measurements OT);3 in 1 bedside commode    Recommendations for Other Services       Precautions / Restrictions Precautions Precautions: Fall Precaution Comments: rib fx      Mobility Bed Mobility Overal bed mobility: Needs Assistance Bed Mobility: Rolling Rolling: Total assist         General bed mobility comments: pt resisting and yelling with attempt to position in bed  Transfers                 General transfer comment: unable     Balance                                           ADL either performed or assessed with clinical judgement   ADL                                         General ADL Comments: currently requiring total assist     Vision         Perception     Praxis      Pertinent Vitals/Pain Pain Assessment: Faces Faces Pain Scale: Hurts even more Pain Location: generalized Pain  Descriptors / Indicators: Grimacing;Guarding;Moaning Pain Intervention(s): Monitored during session;Repositioned     Hand Dominance Right   Extremity/Trunk Assessment Upper Extremity Assessment Upper Extremity Assessment: Generalized weakness(arthritic changes in hands)   Lower Extremity Assessment Lower Extremity Assessment: Defer to PT evaluation   Cervical / Trunk Assessment Cervical / Trunk Assessment: Kyphotic   Communication Communication Communication: No difficulties   Cognition Arousal/Alertness: Awake/alert Behavior During Therapy: Agitated Overall Cognitive Status: Impaired/Different from baseline Area of Impairment: Orientation;Following commands;Safety/judgement;Problem solving;Attention;Memory;Awareness                 Orientation Level: Disoriented to;Place;Time;Situation Current Attention Level: Focused Memory: Decreased short-term memory Following Commands: Follows one step commands inconsistently Safety/Judgement: Decreased awareness of safety;Decreased awareness of deficits Awareness: Intellectual Problem Solving: Slow processing;Decreased initiation;Difficulty sequencing;Requires verbal cues;Requires tactile cues     General Comments       Exercises     Shoulder Instructions      Home Living Family/patient expects to be discharged to:: Private residence Living Arrangements: Alone Available Help at Discharge: Family;Available PRN/intermittently Type of Home: House       Home Layout: Multi-level;Able to live on main level with bedroom/bathroom  Home Equipment: [place] - 2 wheels;Cane - single point          Prior Functioning/Environment Level of Independence: Independent        Comments: gardens, Interior and spatial designer with friends        OT Problem List: Decreased strength;Decreased activity tolerance;Impaired balance (sitting and/or standing);Decreased cognition;Decreased safety awareness;Decreased knowledge of use of  DME or AE;Pain      OT Treatment/Interventions: Self-care/ADL training;DME and/or AE instruction;Cognitive remediation/compensation;Therapeutic activities;Patient/family education;Balance training    OT Goals(Current goals can be found in the care plan section) Acute Rehab OT Goals Patient Stated Goal: to be left alone OT Goal Formulation: Patient unable to participate in goal setting Time For Goal Achievement: 05/05/19 Potential to Achieve Goals: Fair ADL Goals Pt Will Perform Eating: with min assist;sitting;bed level Pt Will Perform Grooming: (P) with min assist;sitting;bed level Pt Will Transfer to Toilet: (P) with +2 assist;with mod assist;stand pivot transfer Additional ADL Goal #1: (P) Pt will follow one step commands with 50% accuracy. Additional ADL Goal #2: (P) Pt will perform bed mobility with moderate assistance in preparation for ADL.  OT Frequency: Min 2X/week   Barriers to D/C:            Co-evaluation              AM-PAC OT "6 Clicks" Daily Activity     Outcome Measure Help from another person eating meals?: Total Help from another person taking care of personal grooming?: Total Help from another person toileting, which includes using toliet, bedpan, or urinal?: Total Help from another person bathing (including washing, rinsing, drying)?: Total Help from another person to put on and taking off regular upper body clothing?: Total Help from another person to put on and taking off regular lower body clothing?: Total 6 Click Score: 6   End of Session    Activity Tolerance: Treatment limited secondary to agitation Patient left: in bed;with call bell/phone within reach;with bed alarm set  OT Visit Diagnosis: Other symptoms and signs involving cognitive function;Pain;Muscle weakness (generalized) (M62.81)                Time: 1345-1406 OT Time Calculation (min): 21 min Charges:  OT General Charges $OT Visit: 1 Visit OT Evaluation $OT Eval Moderate  Complexity: 1 Mod  Elizabeth Burch, OTR/L Acute Rehabilitation Services Pager: [phone] Office: [phone]  Elizabeth Burch 04/21/2019, 2:10 PM

## 2019-04-21 NOTE — Progress Notes (Signed)
PROGRESS NOTE    Elizabeth Burch  LXB:[id number] DOB: [birth year] DOA: 04/19/2019 PCP: Chipper Herb, MD  Brief Narrative: This is a 85 year old female with history of stage IV kidney disease, history of DVT on Coumadin, gout, hypertension who lives independently at home was admitted following a fall, patient cannot recall the events preceding the fall, syncope suspected. -In the emergency room labs noted creatinine of 1.8, SARS COVID-19 PCR was negative, chest x-ray noted multiple minimally displaced fractures of left ribs 5 through 9. -CT abdomen pelvis did not show any other abnormality, x-rays of her pelvis were unremarkable  Assessment & Plan:   Multiple left rib fractures 5- 9 -Minimally displaced, encouraged Tylenol use and incentive spirometer which patient adamantly refuses -I held anticoagulation with Coumadin yesterday due to remote DVT, frequent falls and risk of hemotherapy thorax acutely in the setting, last Doppler from 2014 showed chronic DVT -Discussed risk benefit of holding anticoagulation temporarily at this time with patient and son Eliah Ozawa -Trauma service following -Physical therapy as tolerated  Mild cognitive decline, failure to thrive -Patient's son has noticed slight cognitive decline and personality change over the past week or so -Work-up here is unremarkable, CT head with chronic atrophy only, UA within normal limits, she is not on any sedating medications, labs not contributory -Unfortunately with significant paranoia and anger, she is alert and awake, answers questions selectively, refuses to allow lung exam, refuses to move extremities to command, refused to eat, refused to work with physical therapy -Called and discussed these challenges with son who acknowledges this -TSH and B12 level  Fall/frequent falls -Possible syncope -No events on telemetry, echocardiogram with preserved EF, no significant findings -PT OT evaluation, as tolerated  Abdominal  distention -CT abdomen pelvis on admission was unremarkable, KUB yesterday without ileus -Continue diet, laxatives as tolerated, BM yesterday  History of DVT on chronic Coumadin -CT head unremarkable, history of multiple falls recently -Last Doppler 2014 with chronic DVT -See discussion above will hold anticoagulation for at least 1 to 2 weeks in the setting of above rib fractures and risk of hemothorax etc.  Stage IV chronic kidney disease -Stable, monitor  Hypertension -Continue amlodipine Toprol and losartan at a lower dose  Gout -Stable  DVT prophylaxis: Coumadin on hold, start Lovenox for DVT prophylaxis from tomorrow Code Status: DNR Family Communication: Called and updated son Karcyn Menn yesterday and today Disposition Plan: To be determined, depends on patient's willingness to work with staff and therapy  Consultants:   Trauma surgery   Procedures:   Antimicrobials:    Subjective: -Paranoid,refuses to eat or drink anything -Would not allow an exam, angry and upset at staff  Objective: Vitals:   04/20/19 1955 04/20/19 2137 04/21/19 0348 04/21/19 1246  BP: (!) 181/58 (!) 158/53 111/71 (!) 145/55  Pulse: 72  75 66  Resp: 18 18 20 17   Temp: 97.6 F (36.4 C)  (!) 97.4 F (36.3 C) 97.6 F (36.4 C)  TempSrc: Oral  Oral Axillary  SpO2: 96% 96% 99%   Weight:      Height:        Intake/Output Summary (Last 24 hours) at 04/21/2019 1337 Last data filed at 04/21/2019 0946 Gross per 24 hour  Intake 338.85 ml  Output 700 ml  Net -361.15 ml   Filed Weights   04/19/19 1823 04/19/19 2343  Weight: 68 kg 66.4 kg    Examination:  Gen: Awake, Alert, oriented to self, will not answer a lot of questions to  assess orientation appropriately HEENT: PERRLA, Neck supple, no JVD Lungs: Decreased breath sounds in the left CVS: RRR,No Gallops,Rubs or new Murmurs Abd: Soft, mildly distended, bowel sounds present Extremities: Painful range of motion especially right hip    skin: no new rashes Psychiatry: Poor judgement and insight   Data Reviewed:   CBC: Recent Labs  Lab 04/19/19 1830 04/20/19 0446 04/21/19 0852  WBC 8.6 6.4 9.4  NEUTROABS 6.4  --   --   HGB 10.7* 9.3* 9.9*  HCT 35.2* 30.1* 32.9*  MCV 91.9 91.2 92.2  PLT 224 171 834   Basic Metabolic Panel: Recent Labs  Lab 04/19/19 1830 04/20/19 0446 04/21/19 0852  NA 145 146* 149*  K 4.4 4.2 4.2  CL 115* 117* 120*  CO2 20* 19* 19*  GLUCOSE 107* 85 97  BUN 39* 34* 29*  CREATININE 1.79* 1.44* 1.36*  CALCIUM 10.2 9.7 10.2   GFR: Estimated Creatinine Clearance: 24.6 mL/min (A) (by C-G formula based on SCr of 1.36 mg/dL (H)). Liver Function Tests: Recent Labs  Lab 04/19/19 1830  AST 27  ALT 27  ALKPHOS 129*  BILITOT 0.5  PROT 6.7  ALBUMIN 3.5   No results for input(s): LIPASE, AMYLASE in the last 168 hours. No results for input(s): AMMONIA in the last 168 hours. Coagulation Profile: Recent Labs  Lab 04/16/19 0914 04/20/19 0446 04/21/19 0852  INR 2.7* 3.3* 1.7*   Cardiac Enzymes: Recent Labs  Lab 04/19/19 1830 04/20/19 0446  CKTOTAL 249* 228   BNP (last 3 results) No results for input(s): PROBNP in the last 8760 hours. HbA1C: No results for input(s): HGBA1C in the last 72 hours. CBG: No results for input(s): GLUCAP in the last 168 hours. Lipid Profile: No results for input(s): CHOL, HDL, LDLCALC, TRIG, CHOLHDL, LDLDIRECT in the last 72 hours. Thyroid Function Tests: No results for input(s): TSH, T4TOTAL, FREET4, T3FREE, THYROIDAB in the last 72 hours. Anemia Panel: No results for input(s): VITAMINB12, FOLATE, FERRITIN, TIBC, IRON, RETICCTPCT in the last 72 hours. Urine analysis:    Component Value Date/Time   COLORURINE YELLOW 04/20/2019 1050   APPEARANCEUR CLEAR 04/20/2019 1050   LABSPEC 1.014 04/20/2019 1050   PHURINE 6.0 04/20/2019 1050   GLUCOSEU NEGATIVE 04/20/2019 [place] 04/20/2019 [place] 04/20/2019 1050    BILIRUBINUR neg 09/22/2014 1012   [place] 04/20/2019 1050   PROTEINUR 30 (A) 04/20/2019 1050   UROBILINOGEN 0.2 10/15/2014 1144   NITRITE NEGATIVE 04/20/2019 [place] 04/20/2019 1050   Sepsis Labs: @LABRCNTIP (procalcitonin:4,lacticidven:4)  ) Recent Results (from the past 240 hour(s))  SARS Coronavirus 2 (CEPHEID - Performed in [place] hospital lab), Hosp Order     Status: None   Collection Time: 04/19/19  9:33 PM  Result Value Ref Range Status   SARS Coronavirus 2 NEGATIVE NEGATIVE Final    Comment: (NOTE) If result is NEGATIVE SARS-CoV-2 target nucleic acids are NOT DETECTED. The SARS-CoV-2 RNA is generally detectable in upper and lower  respiratory specimens during the acute phase of infection. The lowest  concentration of SARS-CoV-2 viral copies this assay can detect is 250  copies / mL. A negative result does not preclude SARS-CoV-2 infection  and should not be used as the sole basis for treatment or other  patient management decisions.  A negative result may occur with  improper specimen collection / handling, submission of specimen other  than nasopharyngeal swab, presence of viral mutation(s) within the  areas targeted by  this assay, and inadequate number of viral copies  (<250 copies / mL). A negative result must be combined with clinical  observations, patient history, and epidemiological information. If result is POSITIVE SARS-CoV-2 target nucleic acids are DETECTED. The SARS-CoV-2 RNA is generally detectable in upper and lower  respiratory specimens dur ing the acute phase of infection.  Positive  results are indicative of active infection with SARS-CoV-2.  Clinical  correlation with patient history and other diagnostic information is  necessary to determine patient infection status.  Positive results do  not rule out bacterial infection or co-infection with other viruses. If result is PRESUMPTIVE POSTIVE SARS-CoV-2 nucleic acids  MAY BE PRESENT.   A presumptive positive result was obtained on the submitted specimen  and confirmed on repeat testing.  While 2019 novel coronavirus  (SARS-CoV-2) nucleic acids may be present in the submitted sample  additional confirmatory testing may be necessary for epidemiological  and / or clinical management purposes  to differentiate between  SARS-CoV-2 and other Sarbecovirus currently known to infect humans.  If clinically indicated additional testing with an alternate test  methodology [id number]) is advised. The SARS-CoV-2 RNA is generally  detectable in upper and lower respiratory sp ecimens during the acute  phase of infection. The expected result is Negative. Fact Sheet for Patients:  StrictlyIdeas.no Fact Sheet for Healthcare Providers: BankingDealers.co.za This test is not yet approved or cleared by the [place] FDA and has been authorized for detection and/or diagnosis of SARS-CoV-2 by FDA under an Emergency Use Authorization (EUA).  This EUA will remain in effect (meaning this test can be used) for the duration of the COVID-19 declaration under Section 564(b)(1) of the Act, 21 U.S.C. section 360bbb-3(b)(1), unless the authorization is terminated or revoked sooner. Performed at [facility], [place] [address], [place], [place] [address]   MRSA PCR Screening     Status: None   Collection Time: 04/20/19  9:42 AM  Result Value Ref Range Status   MRSA by PCR NEGATIVE NEGATIVE Final    Comment:        The GeneXpert MRSA Assay (FDA approved for NASAL specimens only), is one component of a comprehensive MRSA colonization surveillance program. It is not intended to diagnose MRSA infection nor to guide or monitor treatment for MRSA infections. Performed at [facility], [place] [address], [place], [place] [address]          Radiology Studies: Ct Abdomen Wo Contrast  Result Date: 04/19/2019 CLINICAL  DATA:  85 year old post fall this morning. Rib fractures on radiograph. EXAM: CT CHEST, ABDOMEN AND PELVIS WITHOUT CONTRAST TECHNIQUE: Multidetector CT imaging of the chest, abdomen and pelvis was performed following the standard protocol without IV contrast. COMPARISON:  Chest radiograph earlier this day. FINDINGS: CT CHEST FINDINGS Cardiovascular: Aortic atherosclerosis. No periaortic stranding to suggest injury. Mild cardiomegaly. There are coronary artery calcifications. No pericardial effusion. Mediastinum/Nodes: Mediastinal hemorrhage or hematoma. No pneumomediastinum. The esophagus is decompressed. Slightly heterogeneous thyroid gland without dominant nodule. Evidence of mediastinal adenopathy, lack of IV contrast limits detailed assessment. Lungs/Pleura: No pneumothorax. Evidence pulmonary contusion. Trace left pleural thickening/effusion and atelectasis the lower lobe. No right pleural fluid. Linear atelectasis or scarring in the anterior right upper lobe and right middle lobe. Calcified granuloma in the right middle lobe. No pulmonary edema. Musculoskeletal: Acute anterolateral minimally displaced left fifth through ninth rib fractures. Remote sternal fracture. Remote T6 compression fracture. Included shoulder girdles and clavicles are intact. No confluent chest Bernardini contusion. CT  ABDOMEN PELVIS FINDINGS Hepatobiliary: Lack of IV contrast limits assessment for injury. Allowing for this, no evidence of hepatic injury. 11 mm simple cyst in the left lobe of the liver. No perihepatic hematoma. Gallbladder is unremarkable. Pancreas: No evidence of injury. No ductal dilatation or inflammation. Spleen: Lack of IV contrast limits detailed assessment. Allowing for this, no evidence of injury. No perisplenic hematoma. Adrenals/Urinary Tract: No adrenal hemorrhage or renal injury identified. Bilateral renal parenchymal thinning. Cortical scarring in the upper right kidney. Bladder is unremarkable. Stomach/Bowel:  Small hiatal hernia. Stomach nondistended. No bowel Wiederholt thickening or inflammatory change. No evidence of bowel injury. No mesenteric hematoma. Sutures at the base of the cecum from presumed appendectomy. Distal colonic diverticulosis without diverticulitis. Vascular/Lymphatic: Aortic atherosclerosis without aneurysm. No retroperitoneal fluid or stranding. No adenopathy. Reproductive: Status post hysterectomy. No adnexal masses. Other: No free fluid or free air. No confluent body [place] contusion. Musculoskeletal: No fracture of the pelvis or lumbar spine. IMPRESSION: 1. Acute anterolateral minimally displaced left fifth through ninth rib fractures. No pneumothorax. Trace left pleural effusion/pleural thickening. 2. No additional acute traumatic injury to the chest, abdomen, or pelvis. 3. Remote sternal fracture.  Remote T6 compression fracture. 4. Coronary artery calcifications. Aortic Atherosclerosis (ICD10-I70.0). Electronically Signed   By: Keith Rake M.D.   On: 04/19/2019 21:20   Dg Chest 1 View  Result Date: 04/19/2019 CLINICAL DATA:  Fall EXAM: CHEST  1 VIEW COMPARISON:  12/29/2016 FINDINGS: Cardiomegaly. Both lungs are clear. Multiple minimally displaced fractures of the left ribs. IMPRESSION: 1. Multiple minimally displaced fractures of the left ribs. No pneumothorax or pleural effusion is appreciated. 2.  Cardiomegaly. Electronically Signed   By: Eddie Candle M.D.   On: 04/19/2019 19:38   Dg Pelvis 1-2 Views  Result Date: 04/19/2019 CLINICAL DATA:  Fall EXAM: PELVIS - 1-2 VIEW COMPARISON:  None. FINDINGS: Marked osteopenia. No obvious displaced fracture of the pelvis or bilateral proximal femurs. IMPRESSION: Marked osteopenia. No obvious displaced fracture of the pelvis or bilateral proximal femurs. Please note that plain radiographs are significantly limited for hip and pelvic fracture in the setting of osteopenia; recommend MRI to more sensitively evaluate for marrow edema and fracture if  suspected. Electronically Signed   By: Eddie Candle M.D.   On: 04/19/2019 19:40   Ct Head Wo Contrast  Result Date: 04/19/2019 CLINICAL DATA:  Unwitnessed fall. EXAM: CT HEAD WITHOUT CONTRAST CT CERVICAL SPINE WITHOUT CONTRAST TECHNIQUE: Multidetector CT imaging of the head and cervical spine was performed following the standard protocol without intravenous contrast. Multiplanar CT image reconstructions of the cervical spine were also generated. COMPARISON:  None. FINDINGS: CT HEAD FINDINGS Brain: No evidence of acute infarction, hemorrhage, hydrocephalus, extra-axial collection or mass lesion/mass effect. There is mild diffuse low-attenuation within the subcortical and periventricular white matter compatible with chronic microvascular disease. Vascular: No hyperdense vessel or unexpected calcification. Skull: Normal. Negative for fracture or focal lesion. Sinuses/Orbits: Mild mucosal thickening involves the maxillary sinuses. Other: None. CT CERVICAL SPINE FINDINGS Alignment: Normal alignment of the cervical spine. Skull base and vertebrae: No acute fracture. No primary bone lesion or focal pathologic process. Soft tissues and spinal canal: No prevertebral fluid or swelling. No visible canal hematoma. Disc levels: Mild multi level ventral endplate spurring noted. Disc spaces are relatively well preserved. Upper chest: Negative. Other: None IMPRESSION: 1. No acute intracranial abnormalities. 2. Mild chronic small vessel ischemic change. 3. No evidence for cervical spine fracture or dislocation. Electronically Signed   By: Kerby Moors  M.D.   On: 04/19/2019 19:25   Ct Chest Wo Contrast  Result Date: 04/19/2019 CLINICAL DATA:  85 year old post fall this morning. Rib fractures on radiograph. EXAM: CT CHEST, ABDOMEN AND PELVIS WITHOUT CONTRAST TECHNIQUE: Multidetector CT imaging of the chest, abdomen and pelvis was performed following the standard protocol without IV contrast. COMPARISON:  Chest radiograph  earlier this day. FINDINGS: CT CHEST FINDINGS Cardiovascular: Aortic atherosclerosis. No periaortic stranding to suggest injury. Mild cardiomegaly. There are coronary artery calcifications. No pericardial effusion. Mediastinum/Nodes: Mediastinal hemorrhage or hematoma. No pneumomediastinum. The esophagus is decompressed. Slightly heterogeneous thyroid gland without dominant nodule. Evidence of mediastinal adenopathy, lack of IV contrast limits detailed assessment. Lungs/Pleura: No pneumothorax. Evidence pulmonary contusion. Trace left pleural thickening/effusion and atelectasis the lower lobe. No right pleural fluid. Linear atelectasis or scarring in the anterior right upper lobe and right middle lobe. Calcified granuloma in the right middle lobe. No pulmonary edema. Musculoskeletal: Acute anterolateral minimally displaced left fifth through ninth rib fractures. Remote sternal fracture. Remote T6 compression fracture. Included shoulder girdles and clavicles are intact. No confluent chest Crapps contusion. CT ABDOMEN PELVIS FINDINGS Hepatobiliary: Lack of IV contrast limits assessment for injury. Allowing for this, no evidence of hepatic injury. 11 mm simple cyst in the left lobe of the liver. No perihepatic hematoma. Gallbladder is unremarkable. Pancreas: No evidence of injury. No ductal dilatation or inflammation. Spleen: Lack of IV contrast limits detailed assessment. Allowing for this, no evidence of injury. No perisplenic hematoma. Adrenals/Urinary Tract: No adrenal hemorrhage or renal injury identified. Bilateral renal parenchymal thinning. Cortical scarring in the upper right kidney. Bladder is unremarkable. Stomach/Bowel: Small hiatal hernia. Stomach nondistended. No bowel Jenson thickening or inflammatory change. No evidence of bowel injury. No mesenteric hematoma. Sutures at the base of the cecum from presumed appendectomy. Distal colonic diverticulosis without diverticulitis. Vascular/Lymphatic: Aortic  atherosclerosis without aneurysm. No retroperitoneal fluid or stranding. No adenopathy. Reproductive: Status post hysterectomy. No adnexal masses. Other: No free fluid or free air. No confluent body [place] contusion. Musculoskeletal: No fracture of the pelvis or lumbar spine. IMPRESSION: 1. Acute anterolateral minimally displaced left fifth through ninth rib fractures. No pneumothorax. Trace left pleural effusion/pleural thickening. 2. No additional acute traumatic injury to the chest, abdomen, or pelvis. 3. Remote sternal fracture.  Remote T6 compression fracture. 4. Coronary artery calcifications. Aortic Atherosclerosis (ICD10-I70.0). Electronically Signed   By: Keith Rake M.D.   On: 04/19/2019 21:20   Ct Cervical Spine Wo Contrast  Result Date: 04/19/2019 CLINICAL DATA:  Unwitnessed fall. EXAM: CT HEAD WITHOUT CONTRAST CT CERVICAL SPINE WITHOUT CONTRAST TECHNIQUE: Multidetector CT imaging of the head and cervical spine was performed following the standard protocol without intravenous contrast. Multiplanar CT image reconstructions of the cervical spine were also generated. COMPARISON:  None. FINDINGS: CT HEAD FINDINGS Brain: No evidence of acute infarction, hemorrhage, hydrocephalus, extra-axial collection or mass lesion/mass effect. There is mild diffuse low-attenuation within the subcortical and periventricular white matter compatible with chronic microvascular disease. Vascular: No hyperdense vessel or unexpected calcification. Skull: Normal. Negative for fracture or focal lesion. Sinuses/Orbits: Mild mucosal thickening involves the maxillary sinuses. Other: None. CT CERVICAL SPINE FINDINGS Alignment: Normal alignment of the cervical spine. Skull base and vertebrae: No acute fracture. No primary bone lesion or focal pathologic process. Soft tissues and spinal canal: No prevertebral fluid or swelling. No visible canal hematoma. Disc levels: Mild multi level ventral endplate spurring noted. Disc spaces  are relatively well preserved. Upper chest: Negative. Other: None IMPRESSION: 1. No acute intracranial  abnormalities. 2. Mild chronic small vessel ischemic change. 3. No evidence for cervical spine fracture or dislocation. Electronically Signed   By: Kerby Moors M.D.   On: 04/19/2019 19:25   Dg Abd Portable 1v  Result Date: 04/20/2019 CLINICAL DATA:  Abdominal distension EXAM: PORTABLE ABDOMEN - 1 VIEW COMPARISON:  CT abdomen 04/19/2019 FINDINGS: The bowel gas pattern is normal. No radio-opaque calculi or other significant radiographic abnormality are seen. Atherosclerotic aorta. IMPRESSION: Negative. Electronically Signed   By: Franchot Gallo M.D.   On: 04/20/2019 15:37        Scheduled Meds:  amLODipine  10 mg Oral Daily   febuxostat  40 mg Oral Daily   metoprolol succinate  37.5 mg Oral Daily   polyethylene glycol  17 g Oral Daily   Continuous Infusions:   LOS: 1 day    Time spent: 45min    Domenic Polite, MD [facility]   04/21/2019, 1:37 PM

## 2019-04-21 NOTE — Progress Notes (Signed)
Patient ID: Elizabeth Burch, female   DOB: 12-19-1925, 85 y.o.   MRN: [id number]    Subjective: Reports rib pain is better, denies abdominal pain  Objective: Vital signs in last 24 hours: Temp:  [97.4 F (36.3 C)-98.2 F (36.8 C)] 97.4 F (36.3 C) (05/18 0348) Pulse Rate:  [68-75] 75 (05/18 0348) Resp:  [16-20] 20 (05/18 0348) BP: (111-185)/(53-71) 111/71 (05/18 0348) SpO2:  [96 %-99 %] 99 % (05/18 0348) Last BM Date: 04/19/19  Intake/Output from previous day: 05/17 0701 - 05/18 0700 In: 633.9 [P.O.:460; I.V.:173.9] Out: 700 [Urine:700] Intake/Output this shift: No intake/output data recorded.  General appearance: alert and cooperative Resp: clear to auscultation bilaterally Chest Freer: left sided chest Falwell tenderness Cardio: regular rate and rhythm GI: soft, NT LUE abrasions  Lab Results: CBC  Recent Labs    04/19/19 1830 04/20/19 0446  WBC 8.6 6.4  HGB 10.7* 9.3*  HCT 35.2* 30.1*  PLT 224 171   BMET Recent Labs    04/19/19 1830 04/20/19 0446  NA 145 146*  K 4.4 4.2  CL 115* 117*  CO2 20* 19*  GLUCOSE 107* 85  BUN 39* 34*  CREATININE 1.79* 1.44*  CALCIUM 10.2 9.7   PT/INR Recent Labs    04/20/19 0446  LABPROT 33.2*  INR 3.3*    Assessment/Plan: Fall Left 5-9 rib FX - pulm toilet, IS, mobilize with PT/OT Abdominal pain - neg CT, has resolved ? Dementia - patient is a poor historian H/O DVT - on coumadin at home.  On hold currently CKD - per medicine HTN - per medicine  FEN - regular diet VTE - coumadin on hold   LOS: 1 day    Georganna Skeans, MD, MPH, [facility]: [phone]  04/21/2019

## 2019-04-22 ENCOUNTER — Inpatient Hospital Stay (HOSPITAL_COMMUNITY): Payer: Medicare Other

## 2019-04-22 LAB — TSH: TSH: 1.401 u[IU]/mL (ref 0.350–4.500)

## 2019-04-22 LAB — PROTIME-INR
INR: 1.5 — ABNORMAL HIGH (ref 0.8–1.2)
Prothrombin Time: 17.5 seconds — ABNORMAL HIGH (ref 11.4–15.2)

## 2019-04-22 LAB — VITAMIN B12: Vitamin B-12: 177 pg/mL — ABNORMAL LOW (ref 180–914)

## 2019-04-22 NOTE — Progress Notes (Signed)
Patient ID: Elizabeth Burch, female   DOB: October 12, 1926, 85 y.o.   MRN: [id number]    Subjective: Reports her pain is improved  Objective: Vital signs in last 24 hours: Temp:  [97.6 F (36.4 C)] 97.6 F (36.4 C) (05/19 0521) Pulse Rate:  [66-74] 74 (05/19 0521) Resp:  [17-22] 22 (05/19 0521) BP: (145)/(55) 145/55 (05/18 1246) SpO2:  [90 %] 90 % (05/19 0521) Last BM Date: 04/19/19  Intake/Output from previous day: 05/18 0701 - 05/19 0700 In: 825 [P.O.:225; I.V.:600] Out: -  Intake/Output this shift: No intake/output data recorded.  General appearance: cooperative Resp: clear to auscultation bilaterally Cardio: regular rate and rhythm GI: soft, NT  Lab Results: CBC  Recent Labs    04/20/19 0446 04/21/19 0852  WBC 6.4 9.4  HGB 9.3* 9.9*  HCT 30.1* 32.9*  PLT 171 220   BMET Recent Labs    04/20/19 0446 04/21/19 0852  NA 146* 149*  K 4.2 4.2  CL 117* 120*  CO2 19* 19*  GLUCOSE 85 97  BUN 34* 29*  CREATININE 1.44* 1.36*  CALCIUM 9.7 10.2   PT/INR Recent Labs    04/21/19 0852 04/22/19 0640  LABPROT 19.9* 17.5*  INR 1.7* 1.5*   ABG No results for input(s): PHART, HCO3 in the last 72 hours.  Invalid input(s): PCO2, PO2  Studies/Results: Dg Abd Portable 1v  Result Date: 04/20/2019 CLINICAL DATA:  Abdominal distension EXAM: PORTABLE ABDOMEN - 1 VIEW COMPARISON:  CT abdomen 04/19/2019 FINDINGS: The bowel gas pattern is normal. No radio-opaque calculi or other significant radiographic abnormality are seen. Atherosclerotic aorta. IMPRESSION: Negative. Electronically Signed   By: Franchot Gallo M.D.   On: 04/20/2019 15:37    Anti-infectives: Anti-infectives (From admission, onward)   None      Assessment/Plan: Fall Left 5-9 rib FX - pulm toilet, IS, check F/U CXR now Abdominal pain - neg CT, has resolved ? Dementia - patient is a poor historian H/O DVT - on coumadin at home.  On hold currently CKD - per medicine HTN - per medicine    LOS: 2 days     Georganna Skeans, MD, MPH, [facility]: [phone]  04/22/2019

## 2019-04-22 NOTE — Progress Notes (Signed)
PROGRESS NOTE    Elizabeth Burch  IRJ:[id number] DOB: Aug 23, 1926 DOA: 04/19/2019 PCP: Chipper Herb, MD  Brief Narrative: This is a 85 year old female with history of stage IV kidney disease, history of DVT on Coumadin, gout, hypertension who lives independently at home was admitted following a fall, patient cannot recall the events preceding the fall, syncope suspected. -In the emergency room labs noted creatinine of 1.8, SARS COVID-19 PCR was negative, chest x-ray noted multiple minimally displaced fractures of left ribs 5 through 9. -CT abdomen pelvis did not show any other abnormality, x-rays of her pelvis were unremarkable  Assessment & Plan:   Multiple left rib fractures 5- 9 -Minimally displaced, encouraged Tylenol use and incentive spirometer, unfortunately she refuses to use Incentive spirometry and cognitive problems also limited participation -I held anticoagulation with Coumadin due to remote DVT, frequent falls and risk of hemothorax acutely in this setting, last Doppler from 2014 showed chronic DVT -Discussed risk benefit of holding anticoagulation temporarily at this time with patient and son Anysha Frappier -Trauma service following -Physical therapy as tolerated -Repeat chest x-ray with extensive atelectasis and lung contusion, encouraged activity and incentive spirometer again, will need repeat imaging if she develops fever leukocytosis worsening hypoxia etc. -Continue PT OT -Social work consult for short-term rehab  Mild cognitive decline, failure to thrive -Patient's son has noticed slight cognitive decline and personality change recently -Work-up here is unremarkable, CT head with chronic atrophy only, UA within normal limits, she is not on any sedating medications, labs not contributory -Unfortunately with significant paranoia and anger, she is alert and awake, oriented to self only,  answers questions selectively, yesterday refused to eat refused to allow physical exam,  refused to wait work with therapy, today ate a few bites and was minimally in better spirits -Called and discussed these challenges with son who acknowledges this -Follow up TSH and B12 level -Given ongoing failure to thrive, patient's reluctance to eat much, participate with medical care therapy etc. discussed with the son that if she did not improve may need to consider palliative consult and hospice  Fall/frequent falls -Possible syncope -No events on telemetry, echocardiogram with preserved EF, no significant findings -PT OT evaluation, as tolerated  Abdominal distention -CT abdomen pelvis on admission was unremarkable, KUB without ileus -Continue diet, laxatives as tolerated, she had a BM again today  History of DVT on chronic Coumadin -CT head unremarkable, history of multiple falls recently -Last Doppler 2014 with chronic DVT -See discussion above will hold anticoagulation for at least 1 to 2 weeks in the setting of above rib fractures and risk of hemothorax etc.  Stage IV chronic kidney disease -Stable, monitor  Hypertension -Continue amlodipine Toprol and losartan at a lower dose  Gout -Stable  DVT prophylaxis: Coumadin on hold, Code Status: DNR Family Communication: Called and updated son Justina Bertini 5/17 5/18 and 5/19 Disposition Plan: To be determined, depends on patient's willingness to work with staff and therapy  Consultants:   Trauma surgery   Procedures:   Antimicrobials:    Subjective: -Slightly better spirits today, allowed me to do a physical exam -Alert awake oriented to self only  Objective: Vitals:   04/21/19 0348 04/21/19 1246 04/22/19 0521 04/22/19 1212  BP: 111/71 (!) 145/55  (!) 123/47  Pulse: 75 66 74 (!) 57  Resp: 20 17 (!) 22 (!) 23  Temp: (!) 97.4 F (36.3 C) 97.6 F (36.4 C) 97.6 F (36.4 C)   TempSrc: Oral Axillary Axillary  SpO2: 99%  90%   Weight:      Height:        Intake/Output Summary (Last 24 hours) at 04/22/2019  1448 Last data filed at 04/22/2019 1003 Gross per 24 hour  Intake 1150 ml  Output --  Net 1150 ml   Filed Weights   04/19/19 1823 04/19/19 2343  Weight: 68 kg 66.4 kg    Examination:  Gen: Awake, Alert, oriented to self only, did not answer a lot of questions, mild confusion noted HEENT: PERRLA, Neck supple, no JVD Lungs: Decreased breath sounds at both bases  CVS: RRR,No Gallops,Rubs or new Murmurs Abd: soft, Non tender, non distended, BS present Extremities: Painful range of motion, both hips Skin: no new rashes Psychiatry: Poor judgement and insight   Data Reviewed:   CBC: Recent Labs  Lab 04/19/19 1830 04/20/19 0446 04/21/19 0852  WBC 8.6 6.4 9.4  NEUTROABS 6.4  --   --   HGB 10.7* 9.3* 9.9*  HCT 35.2* 30.1* 32.9*  MCV 91.9 91.2 92.2  PLT 224 171 283   Basic Metabolic Panel: Recent Labs  Lab 04/19/19 1830 04/20/19 0446 04/21/19 0852  NA 145 146* 149*  K 4.4 4.2 4.2  CL 115* 117* 120*  CO2 20* 19* 19*  GLUCOSE 107* 85 97  BUN 39* 34* 29*  CREATININE 1.79* 1.44* 1.36*  CALCIUM 10.2 9.7 10.2   GFR: Estimated Creatinine Clearance: 24.6 mL/min (A) (by C-G formula based on SCr of 1.36 mg/dL (H)). Liver Function Tests: Recent Labs  Lab 04/19/19 1830  AST 27  ALT 27  ALKPHOS 129*  BILITOT 0.5  PROT 6.7  ALBUMIN 3.5   No results for input(s): LIPASE, AMYLASE in the last 168 hours. No results for input(s): AMMONIA in the last 168 hours. Coagulation Profile: Recent Labs  Lab 04/16/19 0914 04/20/19 0446 04/21/19 0852 04/22/19 0640  INR 2.7* 3.3* 1.7* 1.5*   Cardiac Enzymes: Recent Labs  Lab 04/19/19 1830 04/20/19 0446  CKTOTAL 249* 228   BNP (last 3 results) No results for input(s): PROBNP in the last 8760 hours. HbA1C: No results for input(s): HGBA1C in the last 72 hours. CBG: No results for input(s): GLUCAP in the last 168 hours. Lipid Profile: No results for input(s): CHOL, HDL, LDLCALC, TRIG, CHOLHDL, LDLDIRECT in the last 72  hours. Thyroid Function Tests: No results for input(s): TSH, T4TOTAL, FREET4, T3FREE, THYROIDAB in the last 72 hours. Anemia Panel: No results for input(s): VITAMINB12, FOLATE, FERRITIN, TIBC, IRON, RETICCTPCT in the last 72 hours. Urine analysis:    Component Value Date/Time   COLORURINE YELLOW 04/20/2019 1050   APPEARANCEUR CLEAR 04/20/2019 1050   LABSPEC 1.014 04/20/2019 1050   PHURINE 6.0 04/20/2019 1050   GLUCOSEU NEGATIVE 04/20/2019 [place] 04/20/2019 [place] 04/20/2019 1050   BILIRUBINUR neg 09/22/2014 1012   [place] 04/20/2019 1050   PROTEINUR 30 (A) 04/20/2019 1050   UROBILINOGEN 0.2 10/15/2014 1144   NITRITE NEGATIVE 04/20/2019 [place] 04/20/2019 1050   Sepsis Labs: @LABRCNTIP (procalcitonin:4,lacticidven:4)  ) Recent Results (from the past 240 hour(s))  SARS Coronavirus 2 (CEPHEID - Performed in [place] hospital lab), Hosp Order     Status: None   Collection Time: 04/19/19  9:33 PM  Result Value Ref Range Status   SARS Coronavirus 2 NEGATIVE NEGATIVE Final    Comment: (NOTE) If result is NEGATIVE SARS-CoV-2 target nucleic acids are NOT DETECTED. The SARS-CoV-2 RNA is generally detectable in upper  and lower  respiratory specimens during the acute phase of infection. The lowest  concentration of SARS-CoV-2 viral copies this assay can detect is 250  copies / mL. A negative result does not preclude SARS-CoV-2 infection  and should not be used as the sole basis for treatment or other  patient management decisions.  A negative result may occur with  improper specimen collection / handling, submission of specimen other  than nasopharyngeal swab, presence of viral mutation(s) within the  areas targeted by this assay, and inadequate number of viral copies  (<250 copies / mL). A negative result must be combined with clinical  observations, patient history, and epidemiological information. If result is  POSITIVE SARS-CoV-2 target nucleic acids are DETECTED. The SARS-CoV-2 RNA is generally detectable in upper and lower  respiratory specimens dur ing the acute phase of infection.  Positive  results are indicative of active infection with SARS-CoV-2.  Clinical  correlation with patient history and other diagnostic information is  necessary to determine patient infection status.  Positive results do  not rule out bacterial infection or co-infection with other viruses. If result is PRESUMPTIVE POSTIVE SARS-CoV-2 nucleic acids MAY BE PRESENT.   A presumptive positive result was obtained on the submitted specimen  and confirmed on repeat testing.  While 2019 novel coronavirus  (SARS-CoV-2) nucleic acids may be present in the submitted sample  additional confirmatory testing may be necessary for epidemiological  and / or clinical management purposes  to differentiate between  SARS-CoV-2 and other Sarbecovirus currently known to infect humans.  If clinically indicated additional testing with an alternate test  methodology [phone]) is advised. The SARS-CoV-2 RNA is generally  detectable in upper and lower respiratory sp ecimens during the acute  phase of infection. The expected result is Negative. Fact Sheet for Patients:  StrictlyIdeas.no Fact Sheet for Healthcare Providers: BankingDealers.co.za This test is not yet approved or cleared by the [place] FDA and has been authorized for detection and/or diagnosis of SARS-CoV-2 by FDA under an Emergency Use Authorization (EUA).  This EUA will remain in effect (meaning this test can be used) for the duration of the COVID-19 declaration under Section 564(b)(1) of the Act, 21 U.S.C. section 360bbb-3(b)(1), unless the authorization is terminated or revoked sooner. Performed at [facility], [place] [address] [place], [place] [address]   MRSA PCR Screening     Status: None   Collection Time:  04/20/19  9:42 AM  Result Value Ref Range Status   MRSA by PCR NEGATIVE NEGATIVE Final    Comment:        The GeneXpert MRSA Assay (FDA approved for NASAL specimens only), is one component of a comprehensive MRSA colonization surveillance program. It is not intended to diagnose MRSA infection nor to guide or monitor treatment for MRSA infections. Performed at [facility], [place] [address], [place], [place] [address]          Radiology Studies: Dg Chest Port 1 View  Result Date: 04/22/2019 CLINICAL DATA:  Shortness of breath status post fall EXAM: PORTABLE CHEST 1 VIEW COMPARISON:  CT chest 04/19/2019 FINDINGS: Hazy right lower lobe airspace disease concerning for atelectasis/contusion versus pneumonia. Mild left basilar airspace disease likely reflecting atelectasis. No pneumothorax. Stable cardiomegaly. No acute osseous abnormality. Right rib fractures are not well delineated on the current exam. IMPRESSION: 1. Hazy right lower lobe airspace disease concerning for atelectasis/contusion versus pneumonia. Mild left basilar airspace disease likely reflecting atelectasis. Electronically Signed   By: Elbert Ewings  Patel   On: 04/22/2019 12:22   Dg Abd Portable 1v  Result Date: 04/20/2019 CLINICAL DATA:  Abdominal distension EXAM: PORTABLE ABDOMEN - 1 VIEW COMPARISON:  CT abdomen 04/19/2019 FINDINGS: The bowel gas pattern is normal. No radio-opaque calculi or other significant radiographic abnormality are seen. Atherosclerotic aorta. IMPRESSION: Negative. Electronically Signed   By: Franchot Gallo M.D.   On: 04/20/2019 15:37        Scheduled Meds:  amLODipine  10 mg Oral Daily   febuxostat  40 mg Oral Daily   metoprolol succinate  37.5 mg Oral Daily   polyethylene glycol  17 g Oral Daily   Continuous Infusions:  dextrose 50 mL/hr at 04/22/19 1208     LOS: 2 days    Time spent: 27min    Domenic Polite, MD [facility]   04/22/2019, 2:48 PM

## 2019-04-22 NOTE — Progress Notes (Addendum)
Physical Therapy Treatment Patient Details Name: Elizabeth Burch MRN: [id number] DOB: 05/08/1926 Today's Date: 04/22/2019    History of Present Illness Patient is a 85 y/o female presenting to the ED on 04/19/19 s/p fall. Past medical history significant for hypertension, CKD stage IV, history of DVT on Coumadin, and gout. CT head and cervical spine without contrast were without acute intracranial abnormalities or evidence for cervical spine fracture or dislocation. Portable chest x-ray showed multiple minimally displaced fractures of the left ribs without evidence of pneumothorax or pleural effusion.     PT Comments    Patient demonstrated an improved ability to participate today. She was able to follow simple commands with min to mod cuing. She continues to require significant assist for transfers and bed mobility. She was able to transfer to the commode and back but required max a. She was able to sit at the edge of the bed for 6 minutes with intermittent assist. She tends to fall tot he left. Therapy spoke with the patients family about the amount of assist required for the patient. Therapy continues to recommend rehab at a SNF.   Follow Up Recommendations  SNF;Supervision/Assistance - 24 hour     Equipment Recommendations  Other (comment)    Recommendations for Other Services       Precautions / Restrictions Precautions Precautions: Fall Precaution Comments: rib fx Restrictions Weight Bearing Restrictions: No    Mobility  Bed Mobility Overal bed mobility: Needs Assistance Bed Mobility: Rolling Rolling: Max assist;+2 for physical assistance       Sit to sidelying: +2 for physical assistance;Max assist General bed mobility comments: )Patient required max a to transfer to the edge of the bed. Min to Mod a sitting at the edge of the bed. The patient leaned to the left   Transfers Overall transfer level: Needs assistance   Transfers: Sit to/from Stand Sit to Stand: Max  assist;+2 physical assistance;+2 safety/equipment         General transfer comment: Patiient transferd to commode with MAx a. Going to and from the commode the patient corssed her legs. Therapy had to provide max a to safely get to the commode. Once to the commode she would not sit 2nd to confusion  Ambulation/Gait Ambulation/Gait assistance: Max assist;+2 physical assistance Gait Distance (Feet): 3 Feet Assistive device: Rolling walker (2 wheeled) Gait Pattern/deviations: Decreased step length - right;Decreased step length - left;Decreased stride length;Shuffle Gait velocity: decreased   General Gait Details: decreased ability to take steps. Legs frequently crossed    Marine scientist [place] (Stroke Patients Only)       Balance Overall balance assessment: Needs assistance Sitting-balance support: Bilateral upper extremity supported;Feet supported Sitting balance-Leahy Scale: Poor Sitting balance - Comments: needed intermittent assist to sit up    Standing balance support: Bilateral upper extremity supported Standing balance-Leahy Scale: Poor Standing balance comment: Max a for balance                             Cognition Arousal/Alertness: Awake/alert Behavior During Therapy: Agitated Overall Cognitive Status: Impaired/Different from baseline Area of Impairment: Orientation;Following commands;Safety/judgement;Problem solving;Attention;Memory;Awareness                 Orientation Level: Disoriented to;Place;Time;Situation Current Attention Level: Focused Memory: Decreased short-term memory Following Commands: Follows one step commands with increased time;Follows multi-step commands with increased time;Follows  multi-step commands inconsistently Safety/Judgement: Decreased awareness of safety;Decreased awareness of deficits   Problem Solving: Slow processing;Decreased initiation        Exercises       General Comments General comments (skin integrity, edema, etc.): Spoke with patients family voer face time and informed them about the amount of assist that is needed.       Pertinent Vitals/Pain Pain Assessment: Faces Faces Pain Scale: Hurts little more Pain Location: generalized Pain Descriptors / Indicators: Grimacing;Guarding Pain Intervention(s): Limited activity within patient's tolerance;Monitored during session;Repositioned    Home Living                      Prior Function            PT Goals (current goals can now be found in the care plan section) Acute Rehab PT Goals Patient Stated Goal: None stated today  PT Goal Formulation: With patient Time For Goal Achievement: 05/04/19 Potential to Achieve Goals: Fair Progress towards PT goals: Progressing toward goals    Frequency    Min 3X/week      PT Plan Current plan remains appropriate    Co-evaluation              AM-PAC PT "6 Clicks" Mobility   Outcome Measure  Help needed turning from your back to your side while in a flat bed without using bedrails?: A Lot Help needed moving from lying on your back to sitting on the side of a flat bed without using bedrails?: A Lot Help needed moving to and from a bed to a chair (including a wheelchair)?: Total Help needed standing up from a chair using your arms (e.g., wheelchair or bedside chair)?: Total Help needed to walk in hospital room?: Total Help needed climbing 3-5 steps with a railing? : Total 6 Click Score: 8    End of Session Equipment Utilized During Treatment: Gait belt Activity Tolerance: Treatment limited secondary to agitation Patient left: in bed;with call bell/phone within reach;with bed alarm set Nurse Communication: Mobility status PT Visit Diagnosis: Unsteadiness on feet (R26.81);Other abnormalities of gait and mobility (R26.89);Muscle weakness (generalized) (M62.81);History of falling (Z91.81)     Time: 1026-1100  PT Time  Calculation (min) (ACUTE ONLY): 30 min  Charges:  $Therapeutic Activity: 23-37 mins                        Carney Living PT DPT  04/22/2019, 12:13 PM

## 2019-04-23 DIAGNOSIS — Z86718 Personal history of other venous thrombosis and embolism: Secondary | ICD-10-CM

## 2019-04-23 DIAGNOSIS — S2249XA Multiple fractures of ribs, unspecified side, initial encounter for closed fracture: Secondary | ICD-10-CM

## 2019-04-23 LAB — BASIC METABOLIC PANEL
Anion gap: 7 (ref 5–15)
BUN: 32 mg/dL — ABNORMAL HIGH (ref 8–23)
CO2: 20 mmol/L — ABNORMAL LOW (ref 22–32)
Calcium: 9.5 mg/dL (ref 8.9–10.3)
Chloride: 116 mmol/L — ABNORMAL HIGH (ref 98–111)
Creatinine, Ser: 1.33 mg/dL — ABNORMAL HIGH (ref 0.44–1.00)
GFR calc Af Amer: 41 mL/min — ABNORMAL LOW (ref >60)
GFR calc non Af Amer: 35 mL/min — ABNORMAL LOW (ref >60)
Glucose, Bld: 108 mg/dL — ABNORMAL HIGH (ref 70–99)
Potassium: 4.1 mmol/L (ref 3.5–5.1)
Sodium: 143 mmol/L (ref 135–145)

## 2019-04-23 LAB — CBC
HCT: 28.4 % — ABNORMAL LOW (ref 36.0–46.0)
Hemoglobin: 8.8 g/dL — ABNORMAL LOW (ref 12.0–15.0)
MCH: 28.3 pg (ref 26.0–34.0)
MCHC: 31 g/dL (ref 30.0–36.0)
MCV: 91.3 fL (ref 80.0–100.0)
Platelets: 191 10*3/uL (ref 150–400)
RBC: 3.11 MIL/uL — ABNORMAL LOW (ref 3.87–5.11)
RDW: 17.7 % — ABNORMAL HIGH (ref 11.5–15.5)
WBC: 10.7 10*3/uL — ABNORMAL HIGH (ref 4.0–10.5)
nRBC: 0 % (ref 0.0–0.2)

## 2019-04-23 LAB — PROTIME-INR
INR: 1.4 — ABNORMAL HIGH (ref 0.8–1.2)
Prothrombin Time: 17 seconds — ABNORMAL HIGH (ref 11.4–15.2)

## 2019-04-23 MED ORDER — MORPHINE SULFATE (PF) 2 MG/ML IV SOLN
1.0000 mg | INTRAVENOUS | Status: DC | PRN
  Administered 2019-04-23: 1 mg via INTRAVENOUS
  Filled 2019-04-23: qty 1

## 2019-04-23 NOTE — Plan of Care (Signed)
  Problem: Education: Goal: Knowledge of General Education information will improve Description: Including pain rating scale, medication(s)/side effects and non-pharmacologic comfort measures Outcome: Not Progressing   

## 2019-04-23 NOTE — Progress Notes (Signed)
Pt didn't void all night long. Bladder scan showed >557 ml. I/O cath done, took 3 people to do it. Output was 650 ml. Pt was screaming the whole time. Peri care done. Will continue to monitor.

## 2019-04-23 NOTE — Progress Notes (Signed)
[facility]/Trauma Progress Note      Assessment/Plan Fall Left 5-9 rib FX - pulm toilet, IS, F/U CXR 05/20 showed RLL atelectasis vs PNA - pulmonary toilet, wean O2 as able, continuous O2 monitoring, pain control  Abdominal pain - neg CT, has resolved Hx of Dementia? - patient is a poor historian H/O DVT - on coumadin at home.  On hold currently CKD - per medicine HTN - per medicine   FEN: heart healthy VTE: SCD's, chem prophylaxis per medicine but okay to restart coumadin from a trauma standpoint as long as Hgb remains stable ID: none Follow up: PCP  DISPO: Pt needs good pain control and encourage pulmonary toilet, ambulate as able. Trauma services will follow peripherally.     LOS: 3 days    Subjective: CC: rib fracture  Pt is sleeping and will arouse but will not answer questions and seems agitated that I am trying to wake her.   Objective: Vital signs in last 24 hours: Temp:  [98.2 F (36.8 C)-98.5 F (36.9 C)] 98.5 F (36.9 C) (05/20 0403) Pulse Rate:  [57-63] 60 (05/20 0403) Resp:  [19-23] 20 (05/20 0403) BP: (123-147)/(47-99) 131/99 (05/20 0403) SpO2:  [92 %] 92 % (05/20 0403) Last BM Date: 04/20/19  Intake/Output from previous day: 05/19 0701 - 05/20 0700 In: 1371.8 [P.O.:350; I.V.:1021.8] Out: 650 [Urine:650] Intake/Output this shift: No intake/output data recorded.  PE: Gen: NAD, sleeping Cardio: mildly bradycardic, regular rhythm Pulm:  CTA with mildly diminished breath sounds b/l bases, rate slightly increased, effort normal Skin: warm and dry   Anti-infectives: Anti-infectives (From admission, onward)   None      Lab Results:  Recent Labs    04/21/19 0852 04/23/19 0336  WBC 9.4 10.7*  HGB 9.9* 8.8*  HCT 32.9* 28.4*  PLT 220 191   BMET Recent Labs    04/21/19 0852 04/23/19 0336  NA 149* 143  K 4.2 4.1  CL 120* 116*  CO2 19* 20*  GLUCOSE 97 108*  BUN 29* 32*  CREATININE 1.36* 1.33*  CALCIUM 10.2 9.5    PT/INR Recent Labs    04/22/19 0640 04/23/19 0336  LABPROT 17.5* 17.0*  INR 1.5* 1.4*   CMP     Component Value Date/Time   NA 143 04/23/2019 0336   NA 145 (H) 01/06/2019 0926   K 4.1 04/23/2019 0336   CL 116 (H) 04/23/2019 0336   CO2 20 (L) 04/23/2019 0336   GLUCOSE 108 (H) 04/23/2019 0336   BUN 32 (H) 04/23/2019 0336   BUN 40 (H) 01/06/2019 0926   CREATININE 1.33 (H) 04/23/2019 0336   CREATININE 2.05 (H) 02/04/2014 0807   CALCIUM 9.5 04/23/2019 0336   PROT 6.7 04/19/2019 1830   PROT 6.5 01/01/2019 1107   ALBUMIN 3.5 04/19/2019 1830   ALBUMIN 4.2 01/01/2019 1107   AST 27 04/19/2019 1830   ALT 27 04/19/2019 1830   ALKPHOS 129 (H) 04/19/2019 1830   BILITOT 0.5 04/19/2019 1830   BILITOT <0.2 01/01/2019 1107   GFRNONAA 35 (L) 04/23/2019 0336   GFRNONAA 22 (L) 02/04/2014 0807   GFRAA 41 (L) 04/23/2019 0336   GFRAA 25 (L) 02/04/2014 0807   Lipase     Component Value Date/Time   LIPASE 37 10/27/2016 1414    Studies/Results: Dg Chest Port 1 View  Result Date: 04/22/2019 CLINICAL DATA:  Shortness of breath status post fall EXAM: PORTABLE CHEST 1 VIEW COMPARISON:  CT chest 04/19/2019 FINDINGS: Hazy right lower lobe airspace disease concerning  for atelectasis/contusion versus pneumonia. Mild left basilar airspace disease likely reflecting atelectasis. No pneumothorax. Stable cardiomegaly. No acute osseous abnormality. Right rib fractures are not well delineated on the current exam. IMPRESSION: 1. Hazy right lower lobe airspace disease concerning for atelectasis/contusion versus pneumonia. Mild left basilar airspace disease likely reflecting atelectasis. Electronically Signed   By: Kathreen Devoid   On: 04/22/2019 12:22      Kalman Drape , [facility] Surgery 04/23/2019, 7:43 AM  Pager: [phone] Mon-Wed, Friday 7:00am-4:30pm Thurs 7am-11:30am  Consults: [phone]

## 2019-04-23 NOTE — Progress Notes (Signed)
Physical Therapy Treatment Patient Details Name: Elizabeth Burch MRN: [id number] DOB: October 18, 1926 Today's Date: 04/23/2019    History of Present Illness Patient is a 85 y/o female presenting to the ED on 04/19/19 s/p fall. Past medical history significant for hypertension, CKD stage IV, history of DVT on Coumadin, and gout. CT head and cervical spine without contrast were without acute intracranial abnormalities or evidence for cervical spine fracture or dislocation. Portable chest x-ray showed multiple minimally displaced fractures of the left ribs without evidence of pneumothorax or pleural effusion.     PT Comments    Pt lethargic and not responding throughout. Attempted multimodal stimuli including loud auditory, tactile, and noxious. Pt not opening eyes or participating in session. She did, however, resist PROM to bilateral LEs by therapist. All VSS throughout. Pt would continue to benefit from skilled physical therapy services at this time while admitted and after d/c to address the below listed limitations in order to improve overall safety and independence with functional mobility.    Follow Up Recommendations  SNF;Supervision/Assistance - 24 hour     Equipment Recommendations  Other (comment)    Recommendations for Other Services       Precautions / Restrictions Precautions Precautions: Fall Precaution Comments: rib fx Restrictions Weight Bearing Restrictions: No    Mobility  Bed Mobility Overal bed mobility: Needs Assistance             General bed mobility comments: total x2 for repositioning in bed with use of bed pads; pt not alert or awake, not participating in session  Transfers                    Ambulation/Gait                 Stairs             Wheelchair Mobility    Modified Rankin (Stroke Patients Only)       Balance                                            Cognition Arousal/Alertness:  Lethargic Behavior During Therapy: Flat affect Overall Cognitive Status: Difficult to assess                                        Exercises General Exercises - Lower Extremity Ankle Circles/Pumps: PROM;Both;10 reps;Supine Heel Slides: PROM;Both;20 reps;Supine Hip ABduction/ADduction: PROM;Both;20 reps;Supine Other Exercises Other Exercises: PROM bilateral ankle DF stretch    General Comments        Pertinent Vitals/Pain Pain Assessment: Faces Faces Pain Scale: No hurt    Home Living                      Prior Function            PT Goals (current goals can now be found in the care plan section) Acute Rehab PT Goals PT Goal Formulation: With patient Time For Goal Achievement: 05/04/19 Potential to Achieve Goals: Fair Progress towards PT goals: Progressing toward goals    Frequency    Min 3X/week      PT Plan Current plan remains appropriate    Co-evaluation              AM-PAC PT "6  Clicks" Mobility   Outcome Measure  Help needed turning from your back to your side while in a flat bed without using bedrails?: Total Help needed moving from lying on your back to sitting on the side of a flat bed without using bedrails?: Total Help needed moving to and from a bed to a chair (including a wheelchair)?: Total Help needed standing up from a chair using your arms (e.g., wheelchair or bedside chair)?: Total Help needed to walk in hospital room?: Total Help needed climbing 3-5 steps with a railing? : Total 6 Click Score: 6    End of Session   Activity Tolerance: Patient limited by lethargy Patient left: in bed;with call bell/phone within reach;with bed alarm set Nurse Communication: Mobility status PT Visit Diagnosis: Unsteadiness on feet (R26.81);Other abnormalities of gait and mobility (R26.89);Muscle weakness (generalized) (M62.81);History of falling (Z91.81)     Time: 9030-0923 PT Time Calculation (min) (ACUTE ONLY): 16  min  Charges:  $Therapeutic Exercise: 8-22 mins                     Sherie Don, [place], DPT  Acute Rehabilitation Services Pager [phone] Office [place] 04/23/2019, 1:55 PM

## 2019-04-23 NOTE — Progress Notes (Addendum)
PROGRESS NOTE  Elizabeth Burch YQM:[id number] DOB: 19-Dec-1925 DOA: 04/19/2019 PCP: Chipper Herb, MD   LOS: 3 days   Brief narrative: This is a 85 year old female with history of stage IV kidney disease, history of DVT on Coumadin, gout, hypertension who lives independently at home was admitted following a fall, patient cannot recall the events preceding the fall, syncope suspected. -In the emergency room labs noted creatinine of 1.8, SARS COVID-19 PCR was negative, chest x-ray noted multiple minimally displaced fractures of left ribs 5 through 9. -CT abdomen pelvis did not show any other abnormality, x-rays of her pelvis were unremarkable  Subjective: Poor historian.  Patient was somnolent at the time of my evaluation.  Did not verbalize much.  No interval complaints reported by the nursing staff.  Assessment/Plan:  Principal Problem:   Syncope Active Problems:   Essential hypertension   CKD (chronic kidney disease), stage IV (HCC)   Gout   History of DVT of lower extremity   Chronic anticoagulation   Multiple rib fractures involving four or more ribs   Frequent falls  Multiple left rib fractures 5- 9 status post fall and trauma. Minimally displaced. Continue conservative management including incentive spirometry, adequate analgesia..  Coumadin on hold  due to remote DVT, frequent falls and risk of hemothorax acutely in this setting, last Doppler from 2014 showed chronic DVT. Previous provider had discussed risk benefit of holding anticoagulation temporarily at this time with patient and son Elizabeth Burch. Trauma service following.  Has been seen by physical therapy who recommended skilled nursing facility placement. Repeat chest x-ray on 519 with  atelectasis and lung contusion,  will need to repeat imaging if she develops leukocytosis or worsening hypoxia.  Patient is afebrile currently.  Mild cognitive decline, failure to thrive Recent history of cognitive decline and personality  change. CT head with chronic atrophy only.UA within normal limits, labs insignificant.  Try to avoid sedatives/narcotics if possible.  TSH 1.4..  B12 177. Given ongoing failure to thrive, patient's reluctance to eat much, participate with medical care therapy etc. is a possibility that we might have to consider palliative care consult and hospice.  This topic was discussed by the previous provider with the patient's son.  On D5 water at this time.  Fall/frequent falls -Possible syncope. No events on telemetry, echocardiogram with preserved EF, no significant findings.PT OT to continue.   History of DVT on chronic Coumadin  will hold anticoagulation for at least 1 to 2 weeks in the setting of above rib fractures and risk of hemothorax etc.   Stage IV chronic kidney disease -Stable, monitor BMP closely.  Creatinine of 1.3 today.  Hypertension -Continue amlodipine, Toprol at a lower dose.  Blood pressure is stable.  Gout -Stable.  Continue Uloric  VTE Prophylaxis: SCD, holding Coumadin  Code Status: DNR  Family Communication: None today  Disposition Plan: Possible Skilled nursing facility as per physical therapy recommendation.  Consider palliative care if no improvement, worsening signs and symptoms.   Addendum:  04/23/2019 2:18 PM  I had a prolonged discussion with the patient's son on the phone regarding the patient's condition.  Nursing staff reported that the patient has been lethargic the whole day and was agitated and screaming in pain.  With multiple rib fractures, likely that patient is experiencing pain causing the symptoms but she is lethargic to start with.  Patient has been having episodes of bradycardia as well.  I spoke with her son regarding the use of medications to provide  comfort for her.  Patient's son is totally in agreement with the plan.  He wishes to focus on comfort for the patient.  We will start low-dose morphine for pain relief at this time.  I will consult  palliative care to help [place] with her symptoms and goals of care.  Consultants:  Trauma surgery  Procedures:  None  Antibiotics: Anti-infectives (From admission, onward)   None      Objective: Vitals:   04/22/19 2258 04/23/19 0403  BP: (!) 147/52 (!) 131/99  Pulse: 63 60  Resp: 19 20  Temp: 98.2 F (36.8 C) 98.5 F (36.9 C)  SpO2: 92% 92%    Intake/Output Summary (Last 24 hours) at 04/23/2019 1109 Last data filed at 04/23/2019 1100 Gross per 24 hour  Intake 1021.81 ml  Output 650 ml  Net 371.81 ml   Filed Weights   04/19/19 1823 04/19/19 2343  Weight: 68 kg 66.4 kg   Body mass index is 26.77 kg/m.   Physical Exam: GENERAL: Somnolent  at the time of my evaluation.  Did not verbalize much.  Not in obvious distress. HENT: No scleral pallor or icterus. Pupils equally reactive to light. Oral mucosa is moist NECK: is supple, no palpable thyroid enlargement. CHEST:  Diminished breath sounds bilaterally.  No obvious crackles or wheezes noted. CVS: S1 and S2 heard, no murmur. Regular rate and rhythm. No pericardial rub. ABDOMEN: Soft, non-tender, bowel sounds are present. No palpable hepato-splenomegaly. EXTREMITIES: Painful range of motion especially of the lower extremities. CNS: Slightly somnolent.  Moves extremities. SKIN: warm and dry  Data Review: I have personally reviewed the following laboratory data and studies,  CBC: Recent Labs  Lab 04/19/19 1830 04/20/19 0446 04/21/19 0852 04/23/19 0336  WBC 8.6 6.4 9.4 10.7*  NEUTROABS 6.4  --   --   --   HGB 10.7* 9.3* 9.9* 8.8*  HCT 35.2* 30.1* 32.9* 28.4*  MCV 91.9 91.2 92.2 91.3  PLT 224 171 220 440   Basic Metabolic Panel: Recent Labs  Lab 04/19/19 1830 04/20/19 0446 04/21/19 0852 04/23/19 0336  NA 145 146* 149* 143  K 4.4 4.2 4.2 4.1  CL 115* 117* 120* 116*  CO2 20* 19* 19* 20*  GLUCOSE 107* 85 97 108*  BUN 39* 34* 29* 32*  CREATININE 1.79* 1.44* 1.36* 1.33*  CALCIUM 10.2 9.7 10.2 9.5    Liver Function Tests: Recent Labs  Lab 04/19/19 1830  AST 27  ALT 27  ALKPHOS 129*  BILITOT 0.5  PROT 6.7  ALBUMIN 3.5   No results for input(s): LIPASE, AMYLASE in the last 168 hours. No results for input(s): AMMONIA in the last 168 hours. Cardiac Enzymes: Recent Labs  Lab 04/19/19 1830 04/20/19 0446  CKTOTAL 249* 228   BNP (last 3 results) Recent Labs    12/18/18 1021  BNP 386.5*    ProBNP (last 3 results) No results for input(s): PROBNP in the last 8760 hours.  CBG: No results for input(s): GLUCAP in the last 168 hours. Recent Results (from the past 240 hour(s))  SARS Coronavirus 2 (CEPHEID - Performed in [facility] hospital lab), Hosp Order     Status: None   Collection Time: 04/19/19  9:33 PM  Result Value Ref Range Status   SARS Coronavirus 2 NEGATIVE NEGATIVE Final    Comment: (NOTE) If result is NEGATIVE SARS-CoV-2 target nucleic acids are NOT DETECTED. The SARS-CoV-2 RNA is generally detectable in upper and lower  respiratory specimens during the acute phase of infection.  The lowest  concentration of SARS-CoV-2 viral copies this assay can detect is 250  copies / mL. A negative result does not preclude SARS-CoV-2 infection  and should not be used as the sole basis for treatment or other  patient management decisions.  A negative result may occur with  improper specimen collection / handling, submission of specimen other  than nasopharyngeal swab, presence of viral mutation(s) within the  areas targeted by this assay, and inadequate number of viral copies  (<250 copies / mL). A negative result must be combined with clinical  observations, patient history, and epidemiological information. If result is POSITIVE SARS-CoV-2 target nucleic acids are DETECTED. The SARS-CoV-2 RNA is generally detectable in upper and lower  respiratory specimens dur ing the acute phase of infection.  Positive  results are indicative of active infection with SARS-CoV-2.   Clinical  correlation with patient history and other diagnostic information is  necessary to determine patient infection status.  Positive results do  not rule out bacterial infection or co-infection with other viruses. If result is PRESUMPTIVE POSTIVE SARS-CoV-2 nucleic acids MAY BE PRESENT.   A presumptive positive result was obtained on the submitted specimen  and confirmed on repeat testing.  While 2019 novel coronavirus  (SARS-CoV-2) nucleic acids may be present in the submitted sample  additional confirmatory testing may be necessary for epidemiological  and / or clinical management purposes  to differentiate between  SARS-CoV-2 and other Sarbecovirus currently known to infect humans.  If clinically indicated additional testing with an alternate test  methodology [phone]) is advised. The SARS-CoV-2 RNA is generally  detectable in upper and lower respiratory sp ecimens during the acute  phase of infection. The expected result is Negative. Fact Sheet for Patients:  StrictlyIdeas.no Fact Sheet for Healthcare Providers: BankingDealers.co.za This test is not yet approved or cleared by the [place] FDA and has been authorized for detection and/or diagnosis of SARS-CoV-2 by FDA under an Emergency Use Authorization (EUA).  This EUA will remain in effect (meaning this test can be used) for the duration of the COVID-19 declaration under Section 564(b)(1) of the Act, 21 U.S.C. section 360bbb-3(b)(1), unless the authorization is terminated or revoked sooner. Performed at [facility], [place] [address] [place], [place] [address]   MRSA PCR Screening     Status: None   Collection Time: 04/20/19  9:42 AM  Result Value Ref Range Status   MRSA by PCR NEGATIVE NEGATIVE Final    Comment:        The GeneXpert MRSA Assay (FDA approved for NASAL specimens only), is one component of a comprehensive MRSA colonization surveillance program.  It is not intended to diagnose MRSA infection nor to guide or monitor treatment for MRSA infections. Performed at [facility], [place] [address], [place], [place] [address]      Studies: Dg Chest Port 1 View  Result Date: 04/22/2019 CLINICAL DATA:  Shortness of breath status post fall EXAM: PORTABLE CHEST 1 VIEW COMPARISON:  CT chest 04/19/2019 FINDINGS: Hazy right lower lobe airspace disease concerning for atelectasis/contusion versus pneumonia. Mild left basilar airspace disease likely reflecting atelectasis. No pneumothorax. Stable cardiomegaly. No acute osseous abnormality. Right rib fractures are not well delineated on the current exam. IMPRESSION: 1. Hazy right lower lobe airspace disease concerning for atelectasis/contusion versus pneumonia. Mild left basilar airspace disease likely reflecting atelectasis. Electronically Signed   By: Kathreen Devoid   On: 04/22/2019 12:22    Scheduled Meds: . amLODipine  10  mg Oral Daily  . febuxostat  40 mg Oral Daily  . metoprolol succinate  37.5 mg Oral Daily  . polyethylene glycol  17 g Oral Daily    Continuous Infusions: . dextrose 50 mL/hr at 04/23/19 0979     Flora Lipps, MD  [facility] 04/23/2019

## 2019-04-24 ENCOUNTER — Other Ambulatory Visit: Payer: Self-pay

## 2019-04-24 DIAGNOSIS — Z515 Encounter for palliative care: Secondary | ICD-10-CM

## 2019-04-24 DIAGNOSIS — Z7189 Other specified counseling: Secondary | ICD-10-CM

## 2019-04-24 LAB — CBC
HCT: 30.7 % — ABNORMAL LOW (ref 36.0–46.0)
Hemoglobin: 9.6 g/dL — ABNORMAL LOW (ref 12.0–15.0)
MCH: 28.2 pg (ref 26.0–34.0)
MCHC: 31.3 g/dL (ref 30.0–36.0)
MCV: 90.3 fL (ref 80.0–100.0)
Platelets: 204 10*3/uL (ref 150–400)
RBC: 3.4 MIL/uL — ABNORMAL LOW (ref 3.87–5.11)
RDW: 17.4 % — ABNORMAL HIGH (ref 11.5–15.5)
WBC: 8.4 10*3/uL (ref 4.0–10.5)
nRBC: 0 % (ref 0.0–0.2)

## 2019-04-24 LAB — BASIC METABOLIC PANEL
Anion gap: 7 (ref 5–15)
BUN: 31 mg/dL — ABNORMAL HIGH (ref 8–23)
CO2: 19 mmol/L — ABNORMAL LOW (ref 22–32)
Calcium: 9.3 mg/dL (ref 8.9–10.3)
Chloride: 115 mmol/L — ABNORMAL HIGH (ref 98–111)
Creatinine, Ser: 1.26 mg/dL — ABNORMAL HIGH (ref 0.44–1.00)
GFR calc Af Amer: 43 mL/min — ABNORMAL LOW (ref >60)
GFR calc non Af Amer: 37 mL/min — ABNORMAL LOW (ref >60)
Glucose, Bld: 106 mg/dL — ABNORMAL HIGH (ref 70–99)
Potassium: 3.7 mmol/L (ref 3.5–5.1)
Sodium: 141 mmol/L (ref 135–145)

## 2019-04-24 LAB — PROTIME-INR
INR: 1.3 — ABNORMAL HIGH (ref 0.8–1.2)
Prothrombin Time: 16.2 seconds — ABNORMAL HIGH (ref 11.4–15.2)

## 2019-04-24 MED ORDER — LIDOCAINE 5 % EX PTCH
1.0000 | MEDICATED_PATCH | CUTANEOUS | Status: DC
  Administered 2019-04-24 – 2019-04-30 (×7): 1 via TRANSDERMAL
  Filled 2019-04-24 (×7): qty 1

## 2019-04-24 MED ORDER — CYCLOBENZAPRINE HCL 10 MG PO TABS
5.0000 mg | ORAL_TABLET | Freq: Three times a day (TID) | ORAL | Status: DC | PRN
  Administered 2019-04-24: 5 mg via ORAL
  Filled 2019-04-24: qty 1

## 2019-04-24 NOTE — Progress Notes (Addendum)
PROGRESS NOTE  Elizabeth Burch MBT:[id number] DOB: 1926-02-02 DOA: 04/19/2019 PCP: Chipper Herb, MD   LOS: 4 days   Brief narrative: This is a 85 year old female with history of stage IV kidney disease, history of DVT on Coumadin, gout, hypertension who lives independently at home was admitted following a fall, patient couldnot recall the events preceding the fall, syncope suspected. In the emergency room labs noted creatinine of 1.8, SARS COVID-19 PCR was negative, chest x-ray noted multiple minimally displaced fractures of left ribs 5 through 9. CT abdomen pelvis did not show any other abnormality, x-rays of her pelvis were unremarkable.  Subjective: Patient is more alert awake communicative today.  Complains of left chest [place] pain and difficulty taking a deep breath.  Wishes to eat today.  Assessment/Plan:  Principal Problem:   Syncope Active Problems:   Essential hypertension   CKD (chronic kidney disease), stage IV (HCC)   Gout   History of DVT of lower extremity   Chronic anticoagulation   Multiple closed fractures of ribs of left side   Frequent falls   Goals of care, counseling/discussion   Palliative care by specialist  Multiple left rib fractures 5- 9 status post fall and trauma. Minimally displaced. Continue conservative management including incentive spirometry, adequate analgesia.. Patient is more alert awake today and I have encouraged her to participate in incentive spirometry.  Coumadin on hold  due to remote DVT, frequent falls and risk of hemothorax acutely in this setting, last Doppler from 2014 showed chronic DVT. Trauma service following.  Has been seen by physical therapy who recommended skilled nursing facility placement.  Repeat chest x-ray if fever or leukocytosis.  Add Lidoderm patch, warm compress and muscle relaxant today.  Mild cognitive decline, failure to thrive Recent history of cognitive decline and personality change. CT head with chronic atrophy  only.UA within normal limits, labs insignificant.  After discussion with the patient's family yesterday patient was given morphine for pain relief.  Fall/frequent falls -Possible syncope. No events on telemetry, echocardiogram with preserved EF, no significant findings. Continue fall precautions.     History of DVT on chronic Coumadin  will hold anticoagulation for at least 1 to 2 weeks in the setting of above rib fractures and risk of hemothorax etc.   Stage IV chronic kidney disease -Stable, monitor BMP closely.  Creatinine of 1.2 today.  Hypertension -Continue amlodipine, Toprol .  Blood pressure is stable.  Gout -Stable.  Continue Uloric  VTE Prophylaxis: SCD, holding Coumadin  Code Status: DNR  Family Communication: I again spoke with the patient's son Mr. [name] on the phone and updated him about the clinical condition of the patient.  Disposition Plan: Skilled nursing facility as per physical therapy recommendation.  Patient is showing signs of improved mental status today.PT/OT evaluations performed. SNF recommended. SNF appropriate and is felt to need rehabservices to restore this patient to their prior level of function to achieve safe transition back to home care. This patient needs rehab services for at least 5 days per week and skilled nursing services daily to facilitate this transition.  Rehab is being requested as the most appropriate d/c option for this patient and is NOT felt to be for custodial care as evidenced by deteriorating functional status after a fall and trauma, patient was fully functional prior to this admission.   Consultants:   Trauma surgery   Palliative care  Procedures:  None  Antibiotics: Anti-infectives (From admission, onward)   None  Objective: Vitals:   04/24/19 0409 04/24/19 0942  BP: (!) 154/80 (!) 140/59  Pulse: 66   Resp: (!) 22   Temp: (!) 97.3 F (36.3 C)   SpO2: 90% 99%    Intake/Output Summary (Last 24 hours) at  04/24/2019 1128 Last data filed at 04/24/2019 0900 Gross per 24 hour  Intake 1283.15 ml  Output -  Net 1283.15 ml   Filed Weights   04/19/19 1823 04/19/19 2343  Weight: 68 kg 66.4 kg   Body mass index is 26.77 kg/m.   Physical Exam: GENERAL: She is much more alert awake and communicative today.  HENT: No scleral pallor or icterus. Pupils equally reactive to light. Oral mucosa is moist NECK: is supple, no palpable thyroid enlargement. CHEST:  Diminished breath sounds bilaterally.  No obvious crackles or wheezes noted.  Left chest Lattanzio tenderness on palpation with mild bruise. CVS: S1 and S2 heard, no murmur. Regular rate and rhythm. No pericardial rub. ABDOMEN: Soft, non-tender, bowel sounds are present. No palpable hepato-splenomegaly. EXTREMITIES: Moves all extremities.  No edema. CNS: Alert awake and communicative.  Moves extremities. SKIN: warm and dry  Data Review: I have personally reviewed the following laboratory data and studies,  CBC: Recent Labs  Lab 04/19/19 1830 04/20/19 0446 04/21/19 0852 04/23/19 0336 04/24/19 0307  WBC 8.6 6.4 9.4 10.7* 8.4  NEUTROABS 6.4  --   --   --   --   HGB 10.7* 9.3* 9.9* 8.8* 9.6*  HCT 35.2* 30.1* 32.9* 28.4* 30.7*  MCV 91.9 91.2 92.2 91.3 90.3  PLT 224 171 220 191 332   Basic Metabolic Panel: Recent Labs  Lab 04/19/19 1830 04/20/19 0446 04/21/19 0852 04/23/19 0336 04/24/19 0307  NA 145 146* 149* 143 141  K 4.4 4.2 4.2 4.1 3.7  CL 115* 117* 120* 116* 115*  CO2 20* 19* 19* 20* 19*  GLUCOSE 107* 85 97 108* 106*  BUN 39* 34* 29* 32* 31*  CREATININE 1.79* 1.44* 1.36* 1.33* 1.26*  CALCIUM 10.2 9.7 10.2 9.5 9.3   Liver Function Tests: Recent Labs  Lab 04/19/19 1830  AST 27  ALT 27  ALKPHOS 129*  BILITOT 0.5  PROT 6.7  ALBUMIN 3.5   No results for input(s): LIPASE, AMYLASE in the last 168 hours. No results for input(s): AMMONIA in the last 168 hours. Cardiac Enzymes: Recent Labs  Lab 04/19/19 1830 04/20/19  0446  CKTOTAL 249* 228   BNP (last 3 results) Recent Labs    12/18/18 1021  BNP 386.5*    ProBNP (last 3 results) No results for input(s): PROBNP in the last 8760 hours.  CBG: No results for input(s): GLUCAP in the last 168 hours. Recent Results (from the past 240 hour(s))  SARS Coronavirus 2 (CEPHEID - Performed in [place] hospital lab), Hosp Order     Status: None   Collection Time: 04/19/19  9:33 PM  Result Value Ref Range Status   SARS Coronavirus 2 NEGATIVE NEGATIVE Final    Comment: (NOTE) If result is NEGATIVE SARS-CoV-2 target nucleic acids are NOT DETECTED. The SARS-CoV-2 RNA is generally detectable in upper and lower  respiratory specimens during the acute phase of infection. The lowest  concentration of SARS-CoV-2 viral copies this assay can detect is 250  copies / mL. A negative result does not preclude SARS-CoV-2 infection  and should not be used as the sole basis for treatment or other  patient management decisions.  A negative result may occur with  improper specimen collection /  handling, submission of specimen other  than nasopharyngeal swab, presence of viral mutation(s) within the  areas targeted by this assay, and inadequate number of viral copies  (<250 copies / mL). A negative result must be combined with clinical  observations, patient history, and epidemiological information. If result is POSITIVE SARS-CoV-2 target nucleic acids are DETECTED. The SARS-CoV-2 RNA is generally detectable in upper and lower  respiratory specimens dur ing the acute phase of infection.  Positive  results are indicative of active infection with SARS-CoV-2.  Clinical  correlation with patient history and other diagnostic information is  necessary to determine patient infection status.  Positive results do  not rule out bacterial infection or co-infection with other viruses. If result is PRESUMPTIVE POSTIVE SARS-CoV-2 nucleic acids MAY BE PRESENT.   A presumptive  positive result was obtained on the submitted specimen  and confirmed on repeat testing.  While 2019 novel coronavirus  (SARS-CoV-2) nucleic acids may be present in the submitted sample  additional confirmatory testing may be necessary for epidemiological  and / or clinical management purposes  to differentiate between  SARS-CoV-2 and other Sarbecovirus currently known to infect humans.  If clinically indicated additional testing with an alternate test  methodology [phone]) is advised. The SARS-CoV-2 RNA is generally  detectable in upper and lower respiratory sp ecimens during the acute  phase of infection. The expected result is Negative. Fact Sheet for Patients:  StrictlyIdeas.no Fact Sheet for Healthcare Providers: BankingDealers.co.za This test is not yet approved or cleared by the [place] FDA and has been authorized for detection and/or diagnosis of SARS-CoV-2 by FDA under an Emergency Use Authorization (EUA).  This EUA will remain in effect (meaning this test can be used) for the duration of the COVID-19 declaration under Section 564(b)(1) of the Act, 21 U.S.C. section 360bbb-3(b)(1), unless the authorization is terminated or revoked sooner. Performed at [facility], [place] [address], [place], [place] [address]   MRSA PCR Screening     Status: None   Collection Time: 04/20/19  9:42 AM  Result Value Ref Range Status   MRSA by PCR NEGATIVE NEGATIVE Final    Comment:        The GeneXpert MRSA Assay (FDA approved for NASAL specimens only), is one component of a comprehensive MRSA colonization surveillance program. It is not intended to diagnose MRSA infection nor to guide or monitor treatment for MRSA infections. Performed at [facility], [place] [address], [place], [place] [address]      Studies: No results found.  Scheduled Meds: . amLODipine  10 mg Oral Daily  . febuxostat  40 mg Oral Daily  .  lidocaine  1 patch Transdermal Q24H  . metoprolol succinate  37.5 mg Oral Daily  . polyethylene glycol  17 g Oral Daily    Continuous Infusions: . dextrose 50 mL/hr at 04/24/19 0630    Flora Lipps, MD  [facility] 04/24/2019

## 2019-04-24 NOTE — Progress Notes (Signed)
Physical Therapy Treatment Patient Details Name: Elizabeth Burch MRN: [id number] DOB: August 08, 1926 Today's Date: 04/24/2019    History of Present Illness Patient is a 85 y/o female presenting to the ED on 04/19/19 s/p fall. Past medical history significant for hypertension, CKD stage IV, history of DVT on Coumadin, and gout. CT head and cervical spine without contrast were without acute intracranial abnormalities or evidence for cervical spine fracture or dislocation. Portable chest x-ray showed multiple minimally displaced fractures of the left ribs without evidence of pneumothorax or pleural effusion.     PT Comments    Pt seated in recliner slumped to the R side.  She appears fatigue and reports she needs to pee.  Assisted patient to [facility] and back to bed.  Pt required Max +1 or Mod +2.  Pt continues to benefit from SNF placement at d/c.     Follow Up Recommendations  SNF;Supervision/Assistance - 24 hour     Equipment Recommendations  Other (comment)    Recommendations for Other Services       Precautions / Restrictions Precautions Precautions: Fall Precaution Comments: rib fx-L side Restrictions Weight Bearing Restrictions: No    Mobility  Bed Mobility Overal bed mobility: Needs Assistance Bed Mobility: Sit to Supine Rolling: +2 for physical assistance;Total assist     Sit to supine: Mod assist;+2 for physical assistance Sit to sidelying: Total assist;+2 for physical assistance General bed mobility comments: Decreased assistance to return bed, but did require assistance to lift B LEs against gravity back to bed and nurse assisting to lower trunk to bed surface.    Transfers Overall transfer level: Needs assistance Equipment used: 2 person hand held assist Transfers: Sit to/from Stand Sit to Stand: Mod assist;+2 safety/equipment;Max assist Stand pivot transfers: Mod assist;+2 safety/equipment;Max assist       General transfer comment: Max +1 for transfer from chair to  [facility], mod +2 with transfer from commode back to bed.  Pt more fatigued this pm.    Ambulation/Gait Ambulation/Gait assistance: (Just pivotal steps to and from surfaces, recliner>BSC>bed.  )               Stairs             Wheelchair Mobility    Modified Rankin (Stroke Patients Only)       Balance Overall balance assessment: Needs assistance Sitting-balance support: Bilateral upper extremity supported;Feet supported Sitting balance-Leahy Scale: Poor Sitting balance - Comments: posterior lean in sitting.       Standing balance-Leahy Scale: Poor Standing balance comment: external assistance for balance.                            Cognition Arousal/Alertness: Awake/alert Behavior During Therapy: WFL for tasks assessed/performed Overall Cognitive Status: Impaired/Different from baseline Area of Impairment: Following commands;Memory;Problem solving                       Following Commands: Follows one step commands with increased time;Follows multi-step commands with increased time;Follows multi-step commands inconsistently Safety/Judgement: Decreased awareness of deficits;Decreased awareness of safety   Problem Solving: Slow processing;Decreased initiation;Requires verbal cues        Exercises      General Comments        Pertinent Vitals/Pain Pain Assessment: Faces Faces Pain Scale: Hurts whole lot Pain Location: left side Pain Descriptors / Indicators: Moaning;Grimacing Pain Intervention(s): Monitored during session;Repositioned    Home Living  Prior Function            PT Goals (current goals can now be found in the care plan section) Acute Rehab PT Goals Patient Stated Goal: None stated today  Potential to Achieve Goals: Good Progress towards PT goals: Progressing toward goals    Frequency    Min 3X/week      PT Plan Current plan remains appropriate    Co-evaluation      AM-PAC  PT "6 Clicks" Mobility   Outcome Measure  Help needed turning from your back to your side while in a flat bed without using bedrails?: A Lot Help needed moving from lying on your back to sitting on the side of a flat bed without using bedrails?: A Lot Help needed moving to and from a bed to a chair (including a wheelchair)?: A Lot Help needed standing up from a chair using your arms (e.g., wheelchair or bedside chair)?: A Lot Help needed to walk in hospital room?: Total Help needed climbing 3-5 steps with a railing? : Total 6 Click Score: 10    End of Session Equipment Utilized During Treatment: Gait belt Activity Tolerance: Patient tolerated treatment well Patient left: in chair;with call bell/phone within reach;with chair alarm set Nurse Communication: Mobility status PT Visit Diagnosis: Unsteadiness on feet (R26.81);Other abnormalities of gait and mobility (R26.89);Muscle weakness (generalized) (M62.81);History of falling (Z91.81)     Time: 0569-7948 PT Time Calculation (min) (ACUTE ONLY): 14 min  Charges:  $Therapeutic Activity: 8-22 mins                     Governor Rooks, PTA Acute Rehabilitation Services Pager [phone] Office [phone]     Elizabeth Burch 04/24/2019, 3:44 PM

## 2019-04-24 NOTE — TOC Initial Note (Signed)
Transition of Care [facility]) - Initial/Assessment Note    Patient Details  Name: Elizabeth Burch MRN: [id number] Date of Birth: 21-Oct-1926  Transition of [facility]) CM/SW Contact:    Eileen Stanford, LCSW Phone Number: 04/24/2019, 12:34 PM  Clinical Narrative:  Pt is only alert to self and time. CSW spoke with pt's son via telephone. Pt's son states pt was living independently at home prior to admission. Pt's son lives in [place].  Pt's son is agreeable to SNF if that is what is recommended prior to d/c. Pt's son would prefer [facility]. Family has a good friend who works at [employer] and can check in on pt. Pt's son has been in contact with facility they are prepared for pt's referral. Pt's son states after SNF they have aligned 24 hour caregivers to be home with pt.               Expected Discharge Plan: Skilled Nursing Facility Barriers to Discharge: Continued Medical Work up   Patient Goals and CMS Choice Patient states their goals for this hospitalization and ongoing recovery are:: per pt's son" for pt to return home after SNF is able" CMS Medicare.gov Compare Post Acute Care list provided to:: Patient Represenative (must comment) Choice offered to / list presented to : Adult Children  Expected Discharge Plan and Services Expected Discharge Plan: [place] In-house Referral: Clinical Social Work   Post Acute Care Choice: [place] Living arrangements for the past 2 months: [place]                                      Prior Living Arrangements/Services Living arrangements for the past 2 months: Single Family Home Lives with:: Self Patient language and need for interpreter reviewed:: Yes Do you feel safe going back to the place where you live?: No      Need for Family Participation in Patient Care: Yes (Comment) Care giver support system in place?: Yes (comment)   Criminal Activity/Legal Involvement Pertinent to  Current Situation/Hospitalization: No - Comment as needed  Activities of Daily Living      Permission Sought/Granted Permission sought to share information with : Facility Sport and exercise psychologist, Family Supports    Share Information with NAME: [name]  Permission granted to share info w AGENCY: [place]  Permission granted to share info w Relationship: Son     Emotional Assessment Appearance:: Appears stated age Attitude/Demeanor/Rapport: Unable to Assess Affect (typically observed): Unable to Assess Orientation: : Oriented to Self, Oriented to  Time Alcohol / Substance Use: Not Applicable Psych Involvement: No (comment)  Admission diagnosis:  Pain [R52] Syncope [R55] Patient Active Problem List   Diagnosis Date Noted  . Goals of care, counseling/discussion   . Palliative care by specialist   . Multiple closed fractures of ribs of left side 04/19/2019  . Frequent falls 04/19/2019  . Syncope 04/19/2019  . History of DVT of lower extremity 11/13/2016  . Chronic anticoagulation 11/13/2016  . Statin intolerance 03/19/2015  . Cardiomegaly 03/19/2015  . Osteoporosis 12/24/2013  . Kyphosis 11/03/2013  . Acute venous embolism and thrombosis of deep vessels of distal lower extremity ([place]) 08/26/2013  . 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 Fibrocystic disease of right breast 05/22/2013  . DVT of leg (deep venous thrombosis) ([place]) 02/16/2013  . Leg edema, left 09/16/2012  . Gout   .  CKD (chronic kidney disease), stage IV ([place]) 01/08/2012  . Multinodular thyroid 10/25/2011  . Fibrocystic breast disease, left.   . Open-angle glaucoma   . Hyperlipidemia 01/06/2009  . Essential hypertension 01/06/2009   PCP:  Chipper Herb, MD Pharmacy:   [facility] [address], [place] - [place] [address] [place] [address] Phone: [phone] Fax: [phone]  [id number] - [place], [place] [address] Phone: [phone] Fax: [phone]     Social Determinants of Health (SDOH) Interventions    Readmission Risk Interventions No flowsheet data found.

## 2019-04-24 NOTE — NC FL2 (Signed)
[place] MEDICAID FL2 LEVEL OF CARE SCREENING TOOL     IDENTIFICATION  Patient Name: Elizabeth Burch Birthdate: October 08, 1926 Sex: female Admission Date (Current Location): 04/19/2019  [facility] and [place] Number:  [employer] and Address:  [facility] [place]. [facility], [place] [address] [place], [place] [address]      Provider Number: [id number]  Attending Physician Name and Address:  Flora Lipps, MD  Relative Name and Phone Number:  Lizanne Erker- (son)- [phone]    Current Level of Care: Hospital Recommended Level of Care: [place] Prior Approval Number:    Date Approved/Denied:   PASRR Number: [id number]  Discharge Plan: SNF    Current Diagnoses: Patient Active Problem List   Diagnosis Date Noted  . Goals of care, counseling/discussion   . Palliative care by specialist   . Multiple closed fractures of ribs of left side 04/19/2019  . Frequent falls 04/19/2019  . Syncope 04/19/2019  . History of DVT of lower extremity 11/13/2016  . Chronic anticoagulation 11/13/2016  . Statin intolerance 03/19/2015  . Cardiomegaly 03/19/2015  . Osteoporosis 12/24/2013  . Kyphosis 11/03/2013  . Acute venous embolism and thrombosis of deep vessels of distal lower extremity ([place]) 08/26/2013  . 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 Fibrocystic disease of right breast 05/22/2013  . DVT of leg (deep venous thrombosis) ([place]) 02/16/2013  . Leg edema, left 09/16/2012  . Gout   . CKD (chronic kidney disease), stage IV ([place]) 01/08/2012  . Multinodular thyroid 10/25/2011  . Fibrocystic breast disease, left.   . Open-angle glaucoma   . Hyperlipidemia 01/06/2009  . Essential hypertension 01/06/2009    Orientation RESPIRATION BLADDER Height & Weight     Self, Time(currently confused- PTA was living alone)  Normal Continent Weight: 146 lb 6.2 oz (66.4 kg) Height:  5\' 2"  (157.5 cm)  BEHAVIORAL SYMPTOMS/MOOD NEUROLOGICAL BOWEL NUTRITION  STATUS  Other (Comment)(?dementia)   Continent Diet  AMBULATORY STATUS COMMUNICATION OF NEEDS Skin   Extensive Assist Verbally Normal                       Personal Care Assistance Level of Assistance  Bathing, Dressing Bathing Assistance: Maximum assistance   Dressing Assistance: Maximum assistance     Functional Limitations Info             SPECIAL CARE FACTORS FREQUENCY  PT (By licensed PT), OT (By licensed OT)     PT Frequency: 5x week OT Frequency: 5x week            Contractures Contractures Info: Not present    Additional Factors Info  Code Status, Allergies Code Status Info: DNR Allergies Info: Prednisone, Vigamox, Allopurinol, Ace Inhibitors, [place] Sodium, Ciprofloxacin, Clarithromycin, Clindamycin, Nitrofurantion, Ofloxacin, PCN, Keflex, sulfamethoxaxole, Sulfoncmide derivatives           Current Medications (04/24/2019):  This is the current hospital active medication list Current Facility-Administered Medications  Medication Dose Route Frequency Provider Last Rate Last Dose  . acetaminophen (TYLENOL) tablet 650 mg  650 mg Oral Q6H PRN Lenore Cordia, MD       Or  . acetaminophen (TYLENOL) suppository 650 mg  650 mg Rectal Q6H PRN Zada Finders R, MD      . amLODipine (NORVASC) tablet 10 mg  10 mg Oral Daily Lenore Cordia, MD   10 mg at 04/24/19 [phone]  . cyclobenzaprine (FLEXERIL) tablet 5 mg  5 mg Oral TID PRN Pokhrel, Laxman,  MD   5 mg at 04/24/19 0938  . dextrose 5 % solution   Intravenous Continuous Domenic Polite, MD 50 mL/hr at 04/24/19 0630    . febuxostat (ULORIC) tablet 40 mg  40 mg Oral Daily Lenore Cordia, MD   40 mg at 04/24/19 5747  . hydrALAZINE (APRESOLINE) injection 10 mg  10 mg Intravenous Q6H PRN Domenic Polite, MD   10 mg at 04/20/19 2022  . lidocaine (LIDODERM) 5 % 1 patch  1 patch Transdermal Q24H Pokhrel, Laxman, MD   1 patch at 04/24/19 0936  . metoprolol succinate (TOPROL-XL) 24 hr tablet 37.5 mg  37.5 mg Oral  Daily Zada Finders R, MD   37.5 mg at 04/22/19 1011  . morphine 2 MG/ML injection 1 mg  1 mg Intravenous Q3H PRN Pokhrel, Laxman, MD   1 mg at 04/23/19 1614  . ondansetron (ZOFRAN) tablet 4 mg  4 mg Oral Q6H PRN Lenore Cordia, MD       Or  . ondansetron (ZOFRAN) injection 4 mg  4 mg Intravenous Q6H PRN Zada Finders R, MD      . polyethylene glycol (MIRALAX / GLYCOLAX) packet 17 g  17 g Oral Daily Domenic Polite, MD   17 g at 04/22/19 1010  . senna-docusate (Senokot-S) tablet 1 tablet  1 tablet Oral QHS PRN Lenore Cordia, MD         Discharge Medications: Please see discharge summary for a list of discharge medications.  Relevant Imaging Results:  Relevant Lab Results:   Additional Information SS # [id number], COVID-neg 5/16  Yerachmiel Spinney A Nicky Kras, LCSW

## 2019-04-24 NOTE — Progress Notes (Signed)
Physical Therapy Treatment Patient Details Name: Elizabeth Burch MRN: [id number] DOB: 1926-01-08 Today's Date: 04/24/2019    History of Present Illness Patient is a 85 y/o female presenting to the ED on 04/19/19 s/p fall. Past medical history significant for hypertension, CKD stage IV, history of DVT on Coumadin, and gout. CT head and cervical spine without contrast were without acute intracranial abnormalities or evidence for cervical spine fracture or dislocation. Portable chest x-ray showed multiple minimally displaced fractures of the left ribs without evidence of pneumothorax or pleural effusion.     PT Comments    Pt performed supine to sit and stand pivot transfer from bed to chair.  Pt is slow and guarded due to pain.  Plan next session for progression of gait training.  Plan remains appropriate for SNF placement.      Follow Up Recommendations  SNF;Supervision/Assistance - 24 hour     Equipment Recommendations  Other (comment)    Recommendations for Other Services       Precautions / Restrictions Precautions Precautions: Fall Precaution Comments: rib fx Restrictions Weight Bearing Restrictions: No    Mobility  Bed Mobility Overal bed mobility: Needs Assistance Bed Mobility: Supine to Sit         Sit to sidelying: Total assist;+2 for physical assistance General bed mobility comments: Attmpted to roll but due to pain she could not tolerate.  Performed flat spin with bed pad and total assistance.   Transfers Overall transfer level: Needs assistance Equipment used: (FACE to FACE stand pivot) Transfers: Sit to/from [facility] Sit to Stand: Min assist;+2 physical assistance Stand pivot transfers: Mod assist;+2 physical assistance       General transfer comment: Pt performed sit to stand from bed and with FACE to [facility] approach.  Cues for hand placement and arm movement to improve ease.  Pt required moderate assistance to pivot once in standing.    Ambulation/Gait Ambulation/Gait assistance: (NT)               Stairs             Wheelchair Mobility    Modified Rankin (Stroke Patients Only)       Balance Overall balance assessment: Needs assistance Sitting-balance support: Bilateral upper extremity supported;Feet supported Sitting balance-Leahy Scale: Poor Sitting balance - Comments: posterior lean in sitting.       Standing balance-Leahy Scale: Poor Standing balance comment: external assistance for balance.                            Cognition Arousal/Alertness: Lethargic Behavior During Therapy: Flat affect Overall Cognitive Status: Within Functional Limits for tasks assessed(not formally assessed AXO this session and WFL of tasks performed.  )                                        Exercises      General Comments        Pertinent Vitals/Pain Pain Assessment: Faces Faces Pain Scale: No hurt Pain Location: generalized Pain Descriptors / Indicators: Grimacing;Guarding Pain Intervention(s): Monitored during session;Repositioned    Home Living                      Prior Function            PT Goals (current goals can now be found in the care  plan section) Acute Rehab PT Goals Patient Stated Goal: None stated today  Potential to Achieve Goals: Good Progress towards PT goals: Progressing toward goals    Frequency    Min 3X/week      PT Plan Current plan remains appropriate    Co-evaluation PT/OT/SLP Co-Evaluation/Treatment: Yes Reason for Co-Treatment: Complexity of the patient's impairments (multi-system involvement) PT goals addressed during session: Mobility/safety with mobility OT goals addressed during session: ADL's and self-care      AM-PAC PT "6 Clicks" Mobility   Outcome Measure  Help needed turning from your back to your side while in a flat bed without using bedrails?: Total Help needed moving from lying on your back to  sitting on the side of a flat bed without using bedrails?: Total Help needed moving to and from a bed to a chair (including a wheelchair)?: Total Help needed standing up from a chair using your arms (e.g., wheelchair or bedside chair)?: Total Help needed to walk in hospital room?: Total Help needed climbing 3-5 steps with a railing? : Total 6 Click Score: 6    End of Session Equipment Utilized During Treatment: Gait belt Activity Tolerance: Patient tolerated treatment well Patient left: in chair;with call bell/phone within reach;with chair alarm set Nurse Communication: Mobility status PT Visit Diagnosis: Unsteadiness on feet (R26.81);Other abnormalities of gait and mobility (R26.89);Muscle weakness (generalized) (M62.81);History of falling (Z91.81)     Time: 7356-7014 PT Time Calculation (min) (ACUTE ONLY): 27 min  Charges:  $Therapeutic Activity: 8-22 mins                     Governor Rooks, PTA Acute Rehabilitation Services Pager [phone] Office [phone]     Jartavious Mckimmy Eli Hose 04/24/2019, 2:42 PM

## 2019-04-24 NOTE — Care Management Important Message (Signed)
Important Message  Patient Details  Name: Elizabeth Burch MRN: [id number] Date of Birth: 30-Sep-1926   Medicare Important Message Given:  Yes    Roxy Filler 04/24/2019, 2:06 PM

## 2019-04-24 NOTE — Progress Notes (Signed)
Occupational Therapy Treatment Patient Details Name: Elizabeth Burch MRN: [id number] DOB: [birth year] Today's Date: 04/24/2019    History of present illness Patient is a 85 y/o female presenting to the ED on 04/19/19 s/p fall. Past medical history significant for hypertension, CKD stage IV, history of DVT on Coumadin, and gout. CT head and cervical spine without contrast were without acute intracranial abnormalities or evidence for cervical spine fracture or dislocation. Portable chest x-ray showed multiple minimally displaced fractures of the left ribs without evidence of pneumothorax or pleural effusion.    OT comments  Pt was more alert today and was oriented to self and situation. Able to orient to place with min cues/prompts. Pt requesting to stay in bed and rest but was agreeable to working with therapy with encouragement. Pt very painful during movement, especially bed mobility. She transferred from bed to recliner with +2 mod assist. Supervision-min assist for grooming tasks while sitting EOB. Therapist noticed bruise on top of head on right side. RN made aware and was present at end of session to look at bruise. Continue to recommend SNF for discharge planning. Will continue to follow acutely.  Follow Up Recommendations  SNF;Supervision/Assistance - 24 hour    Equipment Recommendations  Hospital bed;Wheelchair (measurements OT);Wheelchair cushion (measurements OT);3 in 1 bedside commode    Recommendations for Other Services      Precautions / Restrictions Precautions Precautions: Fall Precaution Comments: rib fx Restrictions Weight Bearing Restrictions: No       Mobility Bed Mobility Overal bed mobility: Needs Assistance Bed Mobility: Supine to Sit Rolling: +2 for physical assistance;Total assist       Sit to sidelying: Total assist;+2 for physical assistance General bed mobility comments: Attempted rolling but pt too painful.  Use of bed pad to helicopter pt from supine  to sitting EOB with +2 total assist.   Transfers Overall transfer level: Needs assistance Equipment used: 2 person hand held assist Transfers: Sit to/from Stand;Stand Pivot Transfers Sit to Stand: +2 physical assistance;Min assist Stand pivot transfers: +2 physical assistance;Mod assist       General transfer comment: Pt stood with +2 min assist and pivot to chair on right side with +2 mod assist.  Assist for power up with sit<>stand.    Balance Overall balance assessment: Needs assistance Sitting-balance support: Bilateral upper extremity supported;Feet supported Sitting balance-Leahy Scale: Poor Sitting balance - Comments: posterior lean in sitting.       Standing balance-Leahy Scale: Poor Standing balance comment: external assistance for balance.                           ADL either performed or assessed with clinical judgement   ADL Overall ADL's : Needs assistance/impaired     Grooming: Brushing hair;Wash/dry face;Supervision/safety;Minimal assistance Grooming Details (indicate cue type and reason): Min assist to brush hair, supervision to wash face.             Lower Body Dressing: Total assistance;Bed level   Toilet Transfer: +2 for physical assistance;Moderate assistance;Stand-pivot Toilet Transfer Details (indicate cue type and reason): Simulated with transfer from bed to recliner.                 Vision       Perception     Praxis      Cognition Arousal/Alertness: Awake/alert Behavior During Therapy: WFL for tasks assessed/performed Overall Cognitive Status: Impaired/Different from baseline Area of Impairment: Following commands;Memory;Problem solving  Following Commands: Follows one step commands with increased time;Follows multi-step commands with increased time;Follows multi-step commands inconsistently Safety/Judgement: Decreased awareness of deficits;Decreased awareness of safety   Problem  Solving: Slow processing;Decreased initiation;Requires verbal cues          Exercises     Shoulder Instructions       General Comments      Pertinent Vitals/ Pain       Pain Assessment: Faces Faces Pain Scale: Hurts whole lot Pain Location: left side Pain Descriptors / Indicators: Moaning;Grimacing Pain Intervention(s): Monitored during session;Repositioned;Limited activity within patient's tolerance  Home Living                                          Prior Functioning/Environment              Frequency  Min 2X/week        Progress Toward Goals  OT Goals(current goals can now be found in the care plan section)  Progress towards OT goals: Progressing toward goals  Acute Rehab OT Goals Patient Stated Goal: None stated today  OT Goal Formulation: Patient unable to participate in goal setting Time For Goal Achievement: 05/05/19 Potential to Achieve Goals: Fair ADL Goals Pt Will Perform Eating: with min assist;sitting;bed level Pt Will Perform Grooming: with min assist;sitting;bed level Pt Will Transfer to Toilet: with +2 assist;with mod assist;stand pivot transfer Additional ADL Goal #1: Pt will follow one step commands with 50% accuracy. Additional ADL Goal #2: Pt will perform bed mobility with moderate assistance in preparation for ADL.  Plan Discharge plan remains appropriate    Co-evaluation    PT/OT/SLP Co-Evaluation/Treatment: Yes Reason for Co-Treatment: For patient/therapist safety PT goals addressed during session: Mobility/safety with mobility OT goals addressed during session: ADL's and self-care      AM-PAC OT "6 Clicks" Daily Activity     Outcome Measure   Help from another person eating meals?: A Little Help from another person taking care of personal grooming?: A Little Help from another person toileting, which includes using toliet, bedpan, or urinal?: A Lot Help from another person bathing (including washing,  rinsing, drying)?: A Lot Help from another person to put on and taking off regular upper body clothing?: A Lot Help from another person to put on and taking off regular lower body clothing?: Total 6 Click Score: 13    End of Session    OT Visit Diagnosis: Other symptoms and signs involving cognitive function;Pain;Muscle weakness (generalized) (M62.81)   Activity Tolerance Patient tolerated treatment well   Patient Left in chair;with call bell/phone within reach;with chair alarm set;with nursing/sitter in room   Nurse Communication Mobility status;Other (comment)(RN informed of bruise on top right of pt's head)        Time: 9179-1505 OT Time Calculation (min): 23 min  Charges: OT General Charges $OT Visit: 1 Visit OT Treatments $Self Care/Home Management : 8-22 mins     Darrol Jump OTR/L 04/24/2019, 2:51 PM

## 2019-04-24 NOTE — Progress Notes (Signed)
Patient sleeping had H.R. goes  Down to the 40's At. Fib.No complaints. Voiced skin warm and dry. Has Hx. Of slow H.R.

## 2019-04-24 NOTE — Consult Note (Addendum)
Consultation Note Date: 04/24/2019   Patient Name: Elizabeth Burch  DOB: [birth year]  MRN: [id number]  Age / Sex: 85 y.o., female  PCP: Chipper Herb, MD Referring Physician: Flora Lipps, MD  Reason for Consultation: Establishing goals of care  HPI/Patient Profile: 85 y.o. female  with past medical history of CKD 4, remote DVT on coumadin, gout, and HTN admitted on 04/19/2019 after a fall. She was found to have multiple left sided rib fractures.  Throughout hospitalization patient has been somnolent and unable to maintain PO intake. PMT consulted for [place].  Clinical Assessment and Goals of Care: I have reviewed medical records including EPIC notes, labs and imaging, received report from RN, and then spoke with patient's son, [name], to discuss diagnosis prognosis, [place], EOL wishes, disposition and options.  I initially spoke with RN Orvil Feil who reports much improvement in mental status - patient is now alert and interactive, ate 50% of breakfast, took medications.   When I called patient's son [name], I introduced Palliative Medicine as specialized medical care for people living with serious illness. It focuses on providing relief from the symptoms and stress of a serious illness. The goal is to improve quality of life for both the patient and the family.  We discussed a brief life review of the patient. He shares that patient has been living independently for 2 years since her husband passed away. She has many friends in the community that check on her and take her to run errands.  As far as functional and nutritional status, [name] tells me that prior to current situation patient was fully functional and able to care for herself. She had a good appetite.    We discussed her current illness and what it means in the larger context of her on-going co-morbidities. [name] tells me how concerned he was d/t his mother's lethargy over the past few days but  Is quite encouraged by how she is doing today - he was able to speak to her on the phone this morning and states she sounds at her baseline to him.   I attempted to elicit values and goals of care important to the patient.  [name] shares he is hopeful his mother can return to baseline and return to independent living. He is agreeable to rehab if this is suggested.  Patient is already DNR and has a living will on file indicating she would never want life artificially prolonged.   Questions and concerns were addressed. The family was encouraged to call with questions or concerns.   Primary Decision Maker NEXT OF KIN - son steve (patient as mental status improves)  SUMMARY OF RECOMMENDATIONS   - patient with improvement in mental status and PO intake today - family hopeful for return to baseline status - agreeable to rehab if recommended - family specifically requests [facility] - patient is already DNR and has living will indicating she would never want life artificially prolonged  Code Status/Advance Care Planning:  DNR   Symptom Management:   Pain medications have been adjusted this AM by Dr. Louanne Belton  Palliative Prophylaxis:   Aspiration, Bowel Regimen, Delirium Protocol, Frequent Pain Assessment and Turn Reposition  Prognosis:   Unable to determine  Discharge Planning: To Be Determined      Primary Diagnoses: Present on Admission: . Multiple rib fractures involving four or more ribs . CKD (chronic kidney disease), stage IV ([place]) . Essential hypertension . Gout . Syncope   I have reviewed the medical record, interviewed  the patient and family, and examined the patient. The following aspects are pertinent.  Past Medical History:  Diagnosis Date  . Arthritis    Gout  . Cataract   . Chronic kidney disease, stage IV (severe) ([place])   . Diverticulosis of colon (without mention of hemorrhage) 2008  . DVT (deep venous thrombosis) ([place]) 1950  . External hemorrhoids  without mention of complication 5366  . Fibrocystic breast disease   . GERD (gastroesophageal reflux disease) 2004  . Glaucoma   . Gout   . Hiatal hernia 2004  . HTN (hypertension)   . Palpitations   . Peripheral vascular disease ([place])    Social History   Socioeconomic History  . Marital status: Widowed    Spouse name: Gene  . Number of children: 1  . Years of education: Not on file  . Highest education level: Not on file  Occupational History  . 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 Financial resource strain: Not hard at all  . Food insecurity:    Worry: Never true    Inability: Never true  . Transportation needs:    Medical: No    Non-medical: No  Tobacco Use  . Smoking status: Never Smoker  . Smokeless tobacco: Never Used  Substance and Sexual Activity  . Alcohol use: No  . Drug use: No  . Sexual activity: Never  Lifestyle  . Physical activity:    Days per week: 0 days    Minutes per session: 0 min  . Stress: Only a little  Relationships  . Social connections:    Talks on phone: More than three times a week    Gets together: Three times a week    Attends religious service: More than 4 times per year    Active member of club or organization: Yes    Attends meetings of clubs or organizations: 1 to 4 times per year    Relationship status: Widowed  Other Topics Concern  . Not on file  Social History Narrative  . Not on file   Family History  Problem Relation Age of Onset  . Cancer Mother        stomach  . Cancer Sister        breast  . Heart disease Brother   . Heart attack Brother   . Cancer Sister        breast  . Cancer Sister        breast  . Cirrhosis Brother   . Alcohol abuse Brother   . Hyperlipidemia Brother   . Hypertension Brother   . Heart disease Brother   . Diabetes Brother   . Cancer Maternal Grandfather    Scheduled Meds: . amLODipine  10 mg Oral Daily  . febuxostat   40 mg Oral Daily  . lidocaine  1 patch Transdermal Q24H  . metoprolol succinate  37.5 mg Oral Daily  . polyethylene glycol  17 g Oral Daily   Continuous Infusions: . dextrose 50 mL/hr at 04/24/19 0630   PRN Meds:.acetaminophen **OR** acetaminophen, cyclobenzaprine, hydrALAZINE, morphine injection, ondansetron **OR** ondansetron (ZOFRAN) IV, senna-docusate Allergies  Allergen Reactions  . Prednisone Hypertension  . Vigamox [Moxifloxacin] Nausea And Vomiting  . Allopurinol   . Keflex [Cephalexin]     Rash   . Ace Inhibitors Other (See Comments)    unknown  . Alendronate Sodium Rash  . Ciprofloxacin Rash  . Clarithromycin Rash  .  Clindamycin/Lincomycin Rash  . Nitrofurantoin Rash  . Ofloxacin Rash  . Penicillins Rash    Did it involve swelling of the face/tongue/throat, SOB, or low BP? Unknown Did it involve sudden or severe rash/hives, skin peeling, or any reaction on the inside of your mouth or nose? Unknown Did you need to seek medical attention at a hospital or doctor's office? Unknown When did it last happen? unknown If all above answers are "NO", may proceed with cephalosporin use.     . Sulfamethoxazole Rash  . Sulfonamide Derivatives Rash    Vital Signs: BP (!) 154/80 (BP Location: Left Arm)   Pulse 66   Temp (!) 97.3 F (36.3 C) (Axillary)   Resp (!) 22   Ht 5\' 2"  (1.575 m)   Wt 66.4 kg   SpO2 90%   BMI 26.77 kg/m  Pain Scale: 0-10   Pain Score: 6    SpO2: SpO2: 90 % O2 Device:SpO2: 90 % O2 Flow Rate: .O2 Flow Rate (L/min): 2 L/min  IO: Intake/output summary:   Intake/Output Summary (Last 24 hours) at 04/24/2019 0934 Last data filed at 04/24/2019 0900 Gross per 24 hour  Intake 1283.15 ml  Output -  Net 1283.15 ml    LBM: Last BM Date: 04/24/19 Baseline Weight: Weight: 68 kg Most recent weight: Weight: 66.4 kg     Palliative Assessment/Data: PPS 70%    The above conversation was completed via telephone due to the visitor restrictions during  the COVID-19 pandemic. Thorough chart review and discussion with necessary members of the care team was completed as part of assessment. All issues were discussed and addressed but no physical exam was performed.   Time Total: 50 minutes Greater than 50%  of this time was spent counseling and coordinating care related to the above assessment and plan.  Juel Burrow, DNP, AGNP-C Palliative Medicine Team [phone] Pager: [phone]

## 2019-04-25 ENCOUNTER — Inpatient Hospital Stay (HOSPITAL_COMMUNITY): Payer: Medicare Other

## 2019-04-25 LAB — BLOOD GAS, ARTERIAL
Acid-base deficit: 5.1 mmol/L — ABNORMAL HIGH (ref 0.0–2.0)
Bicarbonate: 18.2 mmol/L — ABNORMAL LOW (ref 20.0–28.0)
Drawn by: 275531
FIO2: 0.21
O2 Saturation: 94.4 %
Patient temperature: 98.6
pCO2 arterial: 26.9 mmHg — ABNORMAL LOW (ref 32.0–48.0)
pH, Arterial: 7.446 (ref 7.350–7.450)
pO2, Arterial: 66.3 mmHg — ABNORMAL LOW (ref 83.0–108.0)

## 2019-04-25 LAB — CBC
HCT: 28.4 % — ABNORMAL LOW (ref 36.0–46.0)
Hemoglobin: 9.1 g/dL — ABNORMAL LOW (ref 12.0–15.0)
MCH: 28.6 pg (ref 26.0–34.0)
MCHC: 32 g/dL (ref 30.0–36.0)
MCV: 89.3 fL (ref 80.0–100.0)
Platelets: 192 10*3/uL (ref 150–400)
RBC: 3.18 MIL/uL — ABNORMAL LOW (ref 3.87–5.11)
RDW: 17.2 % — ABNORMAL HIGH (ref 11.5–15.5)
WBC: 7 10*3/uL (ref 4.0–10.5)
nRBC: 0 % (ref 0.0–0.2)

## 2019-04-25 LAB — URINALYSIS, ROUTINE W REFLEX MICROSCOPIC
Bilirubin Urine: NEGATIVE
Glucose, UA: NEGATIVE mg/dL
Ketones, ur: NEGATIVE mg/dL
Nitrite: NEGATIVE
Protein, ur: 30 mg/dL — AB
Specific Gravity, Urine: 1.015 (ref 1.005–1.030)
WBC, UA: >50 WBC/hpf — ABNORMAL HIGH (ref 0–5)
pH: 5 (ref 5.0–8.0)

## 2019-04-25 LAB — BASIC METABOLIC PANEL
Anion gap: 8 (ref 5–15)
BUN: 36 mg/dL — ABNORMAL HIGH (ref 8–23)
CO2: 20 mmol/L — ABNORMAL LOW (ref 22–32)
Calcium: 9.1 mg/dL (ref 8.9–10.3)
Chloride: 111 mmol/L (ref 98–111)
Creatinine, Ser: 1.5 mg/dL — ABNORMAL HIGH (ref 0.44–1.00)
GFR calc Af Amer: 35 mL/min — ABNORMAL LOW (ref >60)
GFR calc non Af Amer: 30 mL/min — ABNORMAL LOW (ref >60)
Glucose, Bld: 101 mg/dL — ABNORMAL HIGH (ref 70–99)
Potassium: 4 mmol/L (ref 3.5–5.1)
Sodium: 139 mmol/L (ref 135–145)

## 2019-04-25 LAB — PROTIME-INR
INR: 1.3 — ABNORMAL HIGH (ref 0.8–1.2)
Prothrombin Time: 15.9 seconds — ABNORMAL HIGH (ref 11.4–15.2)

## 2019-04-25 LAB — MAGNESIUM: Magnesium: 2.3 mg/dL (ref 1.7–2.4)

## 2019-04-25 MED ORDER — HALOPERIDOL LACTATE 5 MG/ML IJ SOLN
2.0000 mg | Freq: Once | INTRAMUSCULAR | Status: AC
  Administered 2019-04-25: 2 mg via INTRAVENOUS
  Filled 2019-04-25: qty 1

## 2019-04-25 MED ORDER — OXYCODONE-ACETAMINOPHEN 5-325 MG PO TABS: 1.0000 | ORAL_TABLET | Freq: Four times a day (QID) | ORAL | Status: DC | PRN

## 2019-04-25 NOTE — Progress Notes (Signed)
Patient having acute urinary retention, per shift report.  Bladder scanned pt, volume 388 ml. Notified provider Dr. Louanne Belton, MD of urinary retention and having three episodes of I/O during the week as well as cloudiness of urine per report of night shift RN.  Per Dr. Louanne Belton, MD Foley catheter to be inserted for at least 24 hrs for urinary retention.

## 2019-04-25 NOTE — Progress Notes (Signed)
Patient has not voided tonight. Bladder scan greater than 366. Patient is not uncomf. Text page X. [place] N.P.

## 2019-04-25 NOTE — Progress Notes (Signed)
PT Cancellation Note  Patient Details Name: Elizabeth Burch MRN: [id number] DOB: 12/04/1926   Cancelled Treatment:    Reason Eval/Treat Not Completed: (P) Fatigue/lethargy limiting ability to participate(Pt unable to follow commands or rouse to participate in PT session.  Nursing called to room about decline in cognitive status.  RN reports patient is waxing a waining and MD is aware. )   Cristela Blue 04/25/2019, 4:05 PM  Governor Rooks, PTA Acute Rehabilitation Services Pager [phone] Office [phone]

## 2019-04-25 NOTE — Progress Notes (Signed)
I and O cath done 450 ml cloudy yellow urine . Patient tolerated well.

## 2019-04-25 NOTE — Progress Notes (Addendum)
PROGRESS NOTE  Elizabeth Burch WLN:[id number] DOB: 1926/03/05 DOA: 04/19/2019 PCP: Chipper Herb, MD   LOS: 5 days   Brief narrative: This is a 85 year old female with history of stage IV kidney disease, history of DVT on Coumadin, gout, hypertension who lives independently at home was admitted following a fall, patient couldnot recall the events preceding the fall, syncope suspected. In the emergency room labs noted creatinine of 1.8, SARS COVID-19 PCR was negative, chest x-ray noted multiple minimally displaced fractures of left ribs 5 through 9. CT abdomen pelvis did not show any other abnormality, x-rays of her pelvis were unremarkable.  Subjective: Nursing staff reported that the patient had urinary retention.  Patient was sleepy at the time of my evaluation.  She denies overt pain otherwise.  Assessment/Plan:  Principal Problem:   Syncope Active Problems:   Essential hypertension   CKD (chronic kidney disease), stage IV (HCC)   Gout   History of DVT of lower extremity   Chronic anticoagulation   Multiple closed fractures of ribs of left side   Frequent falls   Goals of care, counseling/discussion   Palliative care by specialist  Multiple left rib fractures 5- 9 status post fall and trauma. Minimally displaced.  Continue conservative treatment including incentive spirometry. Coumadin on hold  due to remote DVT, frequent falls and risk of hemothorax acutely in this setting, last Doppler from 2014 showed chronic DVT.   Has been seen by physical therapy who recommended skilled nursing facility placement.   Lidoderm patch, warm compress and muscle relaxant for chest Broce pain but will discontinue muscle relaxant due to urinary retention.  Chest Villalon pain, distress has improved with IV morphine.  Add oral medication.  Urinary retention.  Will discontinue muscle relaxant..  Patient will need ambulation.  Continue bowel management.  Mild cognitive decline, failure to thrive  Recent history of cognitive decline and personality change. CT head with chronic atrophy only.UA within normal limits, labs insignificant.  After discussion with the patient's family yesterday patient was given morphine for pain relief.  Fall/frequent falls -Possible syncope. No events on telemetry, echocardiogram with preserved EF, no significant findings. Continue fall precautions.     History of DVT on chronic Coumadin  will hold anticoagulation for at least 1 to 2 weeks in the setting of above rib fractures and risk of hemothorax etc. this was discussed with the patient's son on the phone   Stage IV chronic kidney disease  monitor BMP closely.  Creatinine of 1.5 today.  Will encourage oral hydration.  Hypertension -Continue amlodipine, Toprol .  Blood pressure is reasonably controlled.  Gout -Stable.  Continue Uloric  VTE Prophylaxis: SCD, holding Coumadin  Code Status: DNR  Family Communication: None today  Disposition Plan: Skilled nursing facility as per physical therapy recommendation.    Addendum:  04/25/2019 4:27 PM  Physical therapist reported the patient was not responding much. I also spoke with nursing staff.  Patient has been having decreased responsiveness today.  I spoke with the patient's daughter in law on the phone and updated about the clinical condition of the patient today.  We will repeat a CT scan of the head stat since the patient was on anticoagulation in the past.  CT scan head done on the day of presentation was negative.  Plan to get ABG at this time as well.   Addendum:  04/25/2019 6:56 PM  CT scan report was reviewed and I also discussed with the radiologist.  Patient does have multi  focal embolic stroke.  Uncertain whether this was present on admission and was in a very early stage that showed up on the scan now versus new stroke within the last few days..  Patient was off anticoagulation due to multiple rib fracture.  I had a prolonged discussion  with the patient's son on the phone about this finding.  At this point, the decision is to continue conservative treatment and if her condition declines and deteriorates consider palliative care/comfort based care.  Will discuss with neurology in a.m. about the situation as well.  Consultants:   Trauma surgery   Palliative care  Procedures:  None  Antibiotics: Anti-infectives (From admission, onward)   None      Objective: Vitals:   04/25/19 0524 04/25/19 1057  BP: (!) 112/45 (!) 146/60  Pulse: 60 66  Resp: 16   Temp: 97.6 F (36.4 C) 98.2 F (36.8 C)  SpO2: 94% 96%    Intake/Output Summary (Last 24 hours) at 04/25/2019 1437 Last data filed at 04/25/2019 1100 Gross per 24 hour  Intake 830.3 ml  Output 850 ml  Net -19.7 ml   Filed Weights   04/19/19 1823 04/19/19 2343 04/25/19 0524  Weight: 68 kg 66.4 kg 66.8 kg   Body mass index is 26.94 kg/m.   Physical Exam: GENERAL: Mildly somnolent at the time of my examination. HENT: No scleral pallor or icterus. Pupils equally reactive to light. Oral mucosa is moist NECK: is supple, no palpable thyroid enlargement. CHEST:  Diminished breath sounds bilaterally.  No obvious crackles or wheezes noted.  Left chest Schamberger tenderness on palpation with mild bruise. CVS: S1 and S2 heard, no murmur. Regular rate and rhythm. No pericardial rub. ABDOMEN: Soft, non-tender, bowel sounds are present. No palpable hepato-splenomegaly. EXTREMITIES: Moves all extremities.  No edema. CNS: Mildly somnolent, moves extremities. SKIN: warm and dry  Data Review: I have personally reviewed the following laboratory data and studies,  CBC: Recent Labs  Lab 04/19/19 1830 04/20/19 0446 04/21/19 0852 04/23/19 0336 04/24/19 0307 04/25/19 0322  WBC 8.6 6.4 9.4 10.7* 8.4 7.0  NEUTROABS 6.4  --   --   --   --   --   HGB 10.7* 9.3* 9.9* 8.8* 9.6* 9.1*  HCT 35.2* 30.1* 32.9* 28.4* 30.7* 28.4*  MCV 91.9 91.2 92.2 91.3 90.3 89.3  PLT 224 171 220  191 204 381   Basic Metabolic Panel: Recent Labs  Lab 04/20/19 0446 04/21/19 0852 04/23/19 0336 04/24/19 0307 04/25/19 0322  NA 146* 149* 143 141 139  K 4.2 4.2 4.1 3.7 4.0  CL 117* 120* 116* 115* 111  CO2 19* 19* 20* 19* 20*  GLUCOSE 85 97 108* 106* 101*  BUN 34* 29* 32* 31* 36*  CREATININE 1.44* 1.36* 1.33* 1.26* 1.50*  CALCIUM 9.7 10.2 9.5 9.3 9.1  MG  --   --   --   --  2.3   Liver Function Tests: Recent Labs  Lab 04/19/19 1830  AST 27  ALT 27  ALKPHOS 129*  BILITOT 0.5  PROT 6.7  ALBUMIN 3.5   No results for input(s): LIPASE, AMYLASE in the last 168 hours. No results for input(s): AMMONIA in the last 168 hours. Cardiac Enzymes: Recent Labs  Lab 04/19/19 1830 04/20/19 0446  CKTOTAL 249* 228   BNP (last 3 results) Recent Labs    12/18/18 1021  BNP 386.5*    ProBNP (last 3 results) No results for input(s): PROBNP in the last 8760 hours.  CBG: No  results for input(s): GLUCAP in the last 168 hours. Recent Results (from the past 240 hour(s))  SARS Coronavirus 2 (CEPHEID - Performed in [place] hospital lab), Hosp Order     Status: None   Collection Time: 04/19/19  9:33 PM  Result Value Ref Range Status   SARS Coronavirus 2 NEGATIVE NEGATIVE Final    Comment: (NOTE) If result is NEGATIVE SARS-CoV-2 target nucleic acids are NOT DETECTED. The SARS-CoV-2 RNA is generally detectable in upper and lower  respiratory specimens during the acute phase of infection. The lowest  concentration of SARS-CoV-2 viral copies this assay can detect is 250  copies / mL. A negative result does not preclude SARS-CoV-2 infection  and should not be used as the sole basis for treatment or other  patient management decisions.  A negative result may occur with  improper specimen collection / handling, submission of specimen other  than nasopharyngeal swab, presence of viral mutation(s) within the  areas targeted by this assay, and inadequate number of viral copies  (<250  copies / mL). A negative result must be combined with clinical  observations, patient history, and epidemiological information. If result is POSITIVE SARS-CoV-2 target nucleic acids are DETECTED. The SARS-CoV-2 RNA is generally detectable in upper and lower  respiratory specimens dur ing the acute phase of infection.  Positive  results are indicative of active infection with SARS-CoV-2.  Clinical  correlation with patient history and other diagnostic information is  necessary to determine patient infection status.  Positive results do  not rule out bacterial infection or co-infection with other viruses. If result is PRESUMPTIVE POSTIVE SARS-CoV-2 nucleic acids MAY BE PRESENT.   A presumptive positive result was obtained on the submitted specimen  and confirmed on repeat testing.  While 2019 novel coronavirus  (SARS-CoV-2) nucleic acids may be present in the submitted sample  additional confirmatory testing may be necessary for epidemiological  and / or clinical management purposes  to differentiate between  SARS-CoV-2 and other Sarbecovirus currently known to infect humans.  If clinically indicated additional testing with an alternate test  methodology [phone]) is advised. The SARS-CoV-2 RNA is generally  detectable in upper and lower respiratory sp ecimens during the acute  phase of infection. The expected result is Negative. Fact Sheet for Patients:  StrictlyIdeas.no Fact Sheet for Healthcare Providers: BankingDealers.co.za This test is not yet approved or cleared by the [place] FDA and has been authorized for detection and/or diagnosis of SARS-CoV-2 by FDA under an Emergency Use Authorization (EUA).  This EUA will remain in effect (meaning this test can be used) for the duration of the COVID-19 declaration under Section 564(b)(1) of the Act, 21 U.S.C. section 360bbb-3(b)(1), unless the authorization is terminated or revoked  sooner. Performed at [facility], [place] [address] [place], [place] [address]   MRSA PCR Screening     Status: None   Collection Time: 04/20/19  9:42 AM  Result Value Ref Range Status   MRSA by PCR NEGATIVE NEGATIVE Final    Comment:        The GeneXpert MRSA Assay (FDA approved for NASAL specimens only), is one component of a comprehensive MRSA colonization surveillance program. It is not intended to diagnose MRSA infection nor to guide or monitor treatment for MRSA infections. Performed at [facility], [place] [address] [place], [place] [address]      Studies: No results found.  Scheduled Meds: . amLODipine  10 mg Oral Daily  . febuxostat  40 mg Oral Daily  . lidocaine  1 patch Transdermal Q24H  . metoprolol succinate  37.5 mg Oral Daily  . polyethylene glycol  17 g Oral Daily    Continuous Infusions: . dextrose 50 mL/hr at 04/25/19 0434    Flora Lipps, MD  [facility] 04/25/2019

## 2019-04-25 NOTE — Progress Notes (Signed)
Foley inserted by Ara Kussmaul, RN assisted by Luetta Nutting, RN and Mariana Arn, RN per MD order (Dr. Louanne Belton, MD).  Pt tolerated procedure well, urine returned.

## 2019-04-26 ENCOUNTER — Inpatient Hospital Stay (HOSPITAL_COMMUNITY): Payer: Medicare Other

## 2019-04-26 DIAGNOSIS — I639 Cerebral infarction, unspecified: Secondary | ICD-10-CM

## 2019-04-26 LAB — CBC
HCT: 29.8 % — ABNORMAL LOW (ref 36.0–46.0)
Hemoglobin: 9.4 g/dL — ABNORMAL LOW (ref 12.0–15.0)
MCH: 28.2 pg (ref 26.0–34.0)
MCHC: 31.5 g/dL (ref 30.0–36.0)
MCV: 89.5 fL (ref 80.0–100.0)
Platelets: 256 10*3/uL (ref 150–400)
RBC: 3.33 MIL/uL — ABNORMAL LOW (ref 3.87–5.11)
RDW: 17.3 % — ABNORMAL HIGH (ref 11.5–15.5)
WBC: 9.1 10*3/uL (ref 4.0–10.5)
nRBC: 0 % (ref 0.0–0.2)

## 2019-04-26 LAB — BASIC METABOLIC PANEL
Anion gap: 9 (ref 5–15)
BUN: 31 mg/dL — ABNORMAL HIGH (ref 8–23)
CO2: 19 mmol/L — ABNORMAL LOW (ref 22–32)
Calcium: 9.3 mg/dL (ref 8.9–10.3)
Chloride: 110 mmol/L (ref 98–111)
Creatinine, Ser: 1.51 mg/dL — ABNORMAL HIGH (ref 0.44–1.00)
GFR calc Af Amer: 35 mL/min — ABNORMAL LOW (ref >60)
GFR calc non Af Amer: 30 mL/min — ABNORMAL LOW (ref >60)
Glucose, Bld: 98 mg/dL (ref 70–99)
Potassium: 4.3 mmol/L (ref 3.5–5.1)
Sodium: 138 mmol/L (ref 135–145)

## 2019-04-26 MED ORDER — ASPIRIN 325 MG PO TABS
325.0000 mg | ORAL_TABLET | Freq: Every day | ORAL | Status: DC
  Administered 2019-04-28: 325 mg via ORAL
  Filled 2019-04-26: qty 1

## 2019-04-26 MED ORDER — STROKE: EARLY STAGES OF RECOVERY BOOK
Freq: Once | Status: AC
  Administered 2019-04-26: 18:00:00
  Filled 2019-04-26 (×2): qty 1

## 2019-04-26 MED ORDER — ASPIRIN 300 MG RE SUPP
300.0000 mg | Freq: Every day | RECTAL | Status: DC
  Administered 2019-04-29: 300 mg via RECTAL
  Filled 2019-04-26: qty 1

## 2019-04-26 MED ORDER — LORAZEPAM 2 MG/ML IJ SOLN
1.0000 mg | Freq: Once | INTRAMUSCULAR | Status: AC
  Administered 2019-04-26: 1 mg via INTRAVENOUS
  Filled 2019-04-26: qty 1

## 2019-04-26 NOTE — Progress Notes (Signed)
Urine remains cloudy yellow. Patient now has foley and now also has white sediment in it. X blount N. P. Text page and aware. See orders for U.A. which was sent.

## 2019-04-26 NOTE — Progress Notes (Signed)
U.A. results text page to X. Blount N.P. see orders for Urine Culture. Which was sent.

## 2019-04-26 NOTE — Progress Notes (Signed)
SLP Cancellation Note  Patient Details Name: Elizabeth Burch MRN: [id number] DOB: 01-07-1926   Cancelled treatment:       Reason Eval/Treat Not Completed: Fatigue/lethargy limiting ability to participate   Elvina Sidle, M.S., CCC-SLP 04/26/2019, 3:14 PM

## 2019-04-26 NOTE — Progress Notes (Signed)
Patient is very confused restless. Agitated, pulling of o2, and tele. Trying to pull foley out . Pulling cloths and blankets off. Does not follow commands . Lisa R.N. called Jeannette Corpus see orders for Haldol 2 mg I.V.

## 2019-04-26 NOTE — Plan of Care (Signed)
  Problem: Education: Goal: Knowledge of disease or condition will improve Outcome: Not Progressing   

## 2019-04-26 NOTE — Consult Note (Signed)
Neurology Consultation Reason for Consult: Confusion Referring Physician: Dr. Louanne Belton  CC: Fall  History is obtained from: From Medical record as patient is aphasic and lethargic, no family present at this time   HPI: Elizabeth Burch is a 85 y.o. female with history of stage IV kidney disease, history of DVT on Coumadin, gout, hypertension who lives independently at home was admitted on 04/19/2019, following a fall, patient couldnot recall the events preceding the fall, syncope suspected.  She sustained multiple rib fractures, was evaluated by trauma surgery and her anticoagulation was held because of multiple rib fractures.  History obtained form medical record and RN.  As per RN patient became confused, agitated and developed aphasia last pm (04/25/2019) around 1700.  Ct scan was done  and revealed New hypoattenuating areas with associated loss of gray-white differentiation involving the left temporal and right frontal lobes.  Neurology was consulted for abnormal imaging findings in the setting of altered mental status.  History obtained from family over the phone reports a few days worth of confusion and forgetfulness but a sudden change with the syncopal episode on the day of presentation.  Last known well: Likely many days ago based on family's history. tpa given?: no, definitely past the window  ROS:   Unable to obtain due to altered mental status.   Past Medical History:  Diagnosis Date  . Arthritis    Gout  . Cataract   . Chronic kidney disease, stage IV (severe) ([place])   . Diverticulosis of colon (without mention of hemorrhage) 2008  . DVT (deep venous thrombosis) ([place]) 1950  . External hemorrhoids without mention of complication 1840  . Fibrocystic breast disease   . GERD (gastroesophageal reflux disease) 2004  . Glaucoma   . Gout   . Hiatal hernia 2004  . HTN (hypertension)   . Palpitations   . Peripheral vascular disease ([place])     Family History  Problem Relation Age  of Onset  . Cancer Mother        stomach  . Cancer Sister        breast  . Heart disease Brother   . Heart attack Brother   . Cancer Sister        breast  . Cancer Sister        breast  . Cirrhosis Brother   . Alcohol abuse Brother   . Hyperlipidemia Brother   . Hypertension Brother   . Heart disease Brother   . Diabetes Brother   . Cancer Maternal Grandfather    Social History:  reports that she has never smoked. She has never used smokeless tobacco. She reports that she does not drink alcohol or use drugs.   Exam: Current vital signs: BP (!) 128/59 (BP Location: Left Arm)   Pulse 65   Temp 98.3 F (36.8 C) (Axillary)   Resp 20   Ht 5\' 2"  (1.575 m)   Wt 68.8 kg   SpO2 99%   BMI 27.74 kg/m  Vital signs in last 24 hours: Temp:  [98.3 F (36.8 C)-98.5 F (36.9 C)] 98.3 F (36.8 C) (05/23 0514) Pulse Rate:  [65-86] 65 (05/23 0514) Resp:  [20-33] 20 (05/23 0514) BP: (128-159)/(51-59) 128/59 (05/23 0514) SpO2:  [99 %-100 %] 99 % (05/23 0514) Weight:  [68.8 kg] 68.8 kg (05/23 0514) Physical Exam  Constitutional: Appears well-developed and well-nourished.  Psych: Affect appropriate to situation Eyes: No scleral injection HENT: No OP obstrucion Head: Normocephalic.  Cardiovascular: Normal rate and irregular rhythm.  Respiratory: Effort normal, non-labored breathing GI: Soft.  No distension. There is no tenderness.  Skin: WDI  Neuro: Mental Status: Patient is lethargic, arouses to noxious stimuli, she does not follow commands.  Speech is garbled and non coherent - Global aphasia present, no neglect noticed  Cranial Nerves: II: Visual Fields, No blink to threat on Right. Pupils are equal, round, and reactive to light.   III,IV, VI: EOMI without ptosis or diploplia.  V: Facial sensation is symmetric to temperature VII: Facial movement is symmetric.  VIII: hearing is intact to voice X: Unable to emaine XI: Shoulder shrug is symmetric. XII: tongue is midline  without atrophy or fasciculations.  Motor: Tone is normal. Bulk is normal. Patient moves all extremities, withdraws all four extremities Sensory: Sensation is symmetric to light touch and temperature in the arms and legs. Deep Tendon Reflexes: 2+ and symmetric in the biceps and patellae.  Plantars: Toes are downgoing bilaterally. Cerebellar: FNF and HKS are intact bilaterally NIHSS 1a Level of Conscious.: 1 1b LOC Questions: 2 1c LOC Commands:2  2 Best Gaze: 0 3 Visual: 1 4 Facial Palsy: 0 5a Motor Arm - left: 1 5b Motor Arm - Right: 1 6a Motor Leg - Left: 1 6b Motor Leg - Right: 1 7 Limb Ataxia: 0 8 Sensory: 0 9 Best Language: 2 10 Dysarthria: 1 11 Extinct. and Inatten.: 0 TOTAL: 13  I have reviewed labs in epic and the results pertinent to this consultation are: ABG, CBC, BMP, Echo  BMP Latest Ref Rng & Units 04/26/2019 04/25/2019 04/24/2019  Glucose 70 - 99 mg/dL 98 101(H) 106(H)  BUN 8 - 23 mg/dL 31(H) 36(H) 31(H)  Creatinine 0.44 - 1.00 mg/dL 1.51(H) 1.50(H) 1.26(H)  BUN/Creat Ratio 12 - 28 - - -  Sodium 135 - 145 mmol/L 138 139 141  Potassium 3.5 - 5.1 mmol/L 4.3 4.0 3.7  Chloride 98 - 111 mmol/L 110 111 115(H)  CO2 22 - 32 mmol/L 19(L) 20(L) 19(L)  Calcium 8.9 - 10.3 mg/dL 9.3 9.1 9.3   CBC Latest Ref Rng & Units 04/26/2019 04/25/2019 04/24/2019  WBC 4.0 - 10.5 K/uL 9.1 7.0 8.4  Hemoglobin 12.0 - 15.0 g/dL 9.4(L) 9.1(L) 9.6(L)  Hematocrit 36.0 - 46.0 % 29.8(L) 28.4(L) 30.7(L)  Platelets 150 - 400 K/uL 256 192 204     I have reviewed the images obtained:  CT brain  Attending addendum I seen and evaluated the patient independently I have reviewed the imaging Noncontrast CT of the head on admission was unremarkable Noncontrast CT of the head on 04/25/2019 with hypodensities in the left temporal and right frontal lobes Confirmed by MRI to be acute strokes  Echocardiogram done on 04/20/2019 showed normal LVEF, normal right ventricle normal dilated left atrial  size, dilated right atrium moderately, mild mitral annular calcification.  Assessment 85 year old woman with a history of stage IV kidney disease, history of DVT on Coumadin, gout, hypertension brought in on 04/19/2019 following a fall due to a syncope of which the patient could not really provide more history of.  She sustained multiple rib fractures and was evaluated by trauma surgery and her anticoagulation was held because of multiple risk factors for the risk of bleeding. It is unclear but at some point between yesterday and today she became more confused and a CT head was done to evaluate for the cause of altered mental status which revealed hypodensities in both cerebral hemisphere concerning for strokes. A neurological consultation was obtained I  recommended an MRI of  the brain to be performed which was ordered and revealed left temporal and right frontal acute infarcts. Likely cardioembolic  OSW for tpa or EVT due to many days worth of ongoing symptoms.  Recommendations: No need to allow for permissive hypertension as the symptoms now have been going on for quite a while.  Goal blood pressure should be 140/90 or below. Frequent neurochecks Check Lipids, HgA1C in am  Carotid Dopplers, MRA head without contrast (would have ideally wanted to do a CTA but has deranged renal function) ASA 325mg  Daily-hold anticoagulation for now Swallow screen PT OT Speech therapy Continue tele Echocardiogram already completed.     Please page stroke NP/PA/MD (listed on AMION)  from 8am-4 pm as this patient will be followed by the stroke team at this point.   -- Amie Portland, MD [facility] Pager: [phone] If 7pm to 7am, please call on call as listed on AMION.

## 2019-04-26 NOTE — Progress Notes (Signed)
PROGRESS NOTE  Elizabeth Burch RDE:[id number] DOB: 06-15-1926 DOA: 04/19/2019 PCP: Chipper Herb, MD   LOS: 6 days   Brief narrative: This is a 85 year old female with history of stage IV kidney disease, history of DVT on Coumadin, gout, hypertension who lives independently at home was admitted following a fall, patient couldnot recall the events preceding the fall, syncope suspected. In the emergency room, labs noted creatinine of 1.8, SARS COVID-19 PCR was negative, chest x-ray noted multiple minimally displaced fractures of left ribs 5 through 9. CT abdomen pelvis did not show any other abnormality, x-rays of her pelvis were unremarkable.  Subjective: Patient is very confused agitated today without meaningful response to commands.  Appears to be lethargic.  Assessment/Plan:  Principal Problem:   Syncope Active Problems:   Essential hypertension   CKD (chronic kidney disease), stage IV (HCC)   Gout   History of DVT of lower extremity   Chronic anticoagulation   Multiple closed fractures of ribs of left side   Frequent falls   Goals of care, counseling/discussion   Palliative care by specialist  Acute encephalopathy likely secondary to multifocal CVA.  CT scan showed hypodensities.  MRI of the brain was performed which shows acute infarct of the right frontal lobe and left posterior temporal lobe.  I spoke with neurology this morning.  Neurology will follow the patient today.  Prognosis of the patient is guarded in this situation and the patient's son is aware of the potential poor prognosis and declining clinical condition.Marland Kitchen  Avoid morphine for now.  Son is aware of the findings on the MRI and the CT scan.  Multiple left rib fractures 5- 9 status post fall and trauma. Minimally displaced.  Continue conservative treatment including incentive spirometry. Coumadin on hold  due to remote DVT, frequent falls and risk of hemothorax acutely in this setting, last Doppler from 2014 showed  chronic DVT.   Has been seen by physical therapy who recommended skilled nursing facility placement but with worsening mental situation.  Continue Lidoderm patch, warm compress and muscle relaxant for chest [place] pain but will discontinue muscle relaxant due to urinary retention.  Urinary retention.  Will discontinue muscle relaxant..  Patient will need ambulation.  Continue bowel management.  Mild cognitive decline, failure to thrive on presentation.   Fall/frequent falls -Possible syncope. No events on telemetry, echocardiogram with preserved EF, no significant findings. Continue fall precautions.     History of DVT, atrial fibrillation on chronic Coumadin On hold anticoagulation in the setting of  rib fractures and risk of hemothorax etc. I have spoken with neurology regarding the new embolic stroke.  Patient might need to be restarted on anticoagulation.  Stage IV chronic kidney disease  monitor BMP closely.  Creatinine of 1.5 today.   Hypertension -Continue amlodipine, Toprol .  Blood pressure is reasonably controlled.  Gout -Stable.    VTE Prophylaxis: SCD, coumadin on hold  Code Status: DNR  Family Communication: I spoke with the patient's son on the phone and updated him about the clinical condition of the patient and the potential poor prognosis associated with it.  Disposition Plan: Skilled nursing facility as per physical therapy recommendation but pending clinical improvement..  The prognosis of the patient is guarded in the context of multiple rib trauma and new embolic stroke.  Will follow neurology recommendation.    Consultants:   Trauma surgery   Palliative care   Neurology  Procedures:  None  Antibiotics: Anti-infectives (From admission, onward)  None      Objective: Vitals:   04/25/19 2045 04/26/19 0514  BP:  (!) 128/59  Pulse:  65  Resp: (!) 33 20  Temp:  98.3 F (36.8 C)  SpO2:  99%    Intake/Output Summary (Last 24 hours) at 04/26/2019  0811 Last data filed at 04/26/2019 0651 Gross per 24 hour  Intake 1929.42 ml  Output 1375 ml  Net 554.42 ml   Filed Weights   04/19/19 2343 04/25/19 0524 04/26/19 0514  Weight: 66.4 kg 66.8 kg 68.8 kg   Body mass index is 27.74 kg/m.   Physical Exam: GENERAL: Somnolent, agitated at times, minimally responsive to verbal command, mildly agitated at times HENT: No scleral pallor or icterus. Pupils equally reactive to light. Oral mucosa is dry NECK: is supple, no palpable thyroid enlargement. CHEST:  Diminished breath sounds bilaterally.  CVS: S1 and S2 heard, no murmur. Regular rate and rhythm. No pericardial rub. ABDOMEN: Soft, non-tender, bowel sounds are present. No palpable hepato-splenomegaly. EXTREMITIES.  No edema.  Soft restraints. CNS: somnolent, not much responsive SKIN: warm and dry  Data Review: I have personally reviewed the following laboratory data and studies,  CBC: Recent Labs  Lab 04/19/19 1830  04/21/19 0852 04/23/19 0336 04/24/19 0307 04/25/19 0322 04/26/19 0331  WBC 8.6   < > 9.4 10.7* 8.4 7.0 9.1  NEUTROABS 6.4  --   --   --   --   --   --   HGB 10.7*   < > 9.9* 8.8* 9.6* 9.1* 9.4*  HCT 35.2*   < > 32.9* 28.4* 30.7* 28.4* 29.8*  MCV 91.9   < > 92.2 91.3 90.3 89.3 89.5  PLT 224   < > 220 191 204 192 256   < > = values in this interval not displayed.   Basic Metabolic Panel: Recent Labs  Lab 04/21/19 0852 04/23/19 0336 04/24/19 0307 04/25/19 0322 04/26/19 0331  NA 149* 143 141 139 138  K 4.2 4.1 3.7 4.0 4.3  CL 120* 116* 115* 111 110  CO2 19* 20* 19* 20* 19*  GLUCOSE 97 108* 106* 101* 98  BUN 29* 32* 31* 36* 31*  CREATININE 1.36* 1.33* 1.26* 1.50* 1.51*  CALCIUM 10.2 9.5 9.3 9.1 9.3  MG  --   --   --  2.3  --    Liver Function Tests: Recent Labs  Lab 04/19/19 1830  AST 27  ALT 27  ALKPHOS 129*  BILITOT 0.5  PROT 6.7  ALBUMIN 3.5   No results for input(s): LIPASE, AMYLASE in the last 168 hours. No results for input(s): AMMONIA  in the last 168 hours. Cardiac Enzymes: Recent Labs  Lab 04/19/19 1830 04/20/19 0446  CKTOTAL 249* 228   BNP (last 3 results) Recent Labs    12/18/18 1021  BNP 386.5*    ProBNP (last 3 results) No results for input(s): PROBNP in the last 8760 hours.  CBG: No results for input(s): GLUCAP in the last 168 hours. Recent Results (from the past 240 hour(s))  SARS Coronavirus 2 (CEPHEID - Performed in [place] hospital lab), Hosp Order     Status: None   Collection Time: 04/19/19  9:33 PM  Result Value Ref Range Status   SARS Coronavirus 2 NEGATIVE NEGATIVE Final    Comment: (NOTE) If result is NEGATIVE SARS-CoV-2 target nucleic acids are NOT DETECTED. The SARS-CoV-2 RNA is generally detectable in upper and lower  respiratory specimens during the acute phase of infection. The lowest  concentration of SARS-CoV-2 viral copies this assay can detect is 250  copies / mL. A negative result does not preclude SARS-CoV-2 infection  and should not be used as the sole basis for treatment or other  patient management decisions.  A negative result may occur with  improper specimen collection / handling, submission of specimen other  than nasopharyngeal swab, presence of viral mutation(s) within the  areas targeted by this assay, and inadequate number of viral copies  (<250 copies / mL). A negative result must be combined with clinical  observations, patient history, and epidemiological information. If result is POSITIVE SARS-CoV-2 target nucleic acids are DETECTED. The SARS-CoV-2 RNA is generally detectable in upper and lower  respiratory specimens dur ing the acute phase of infection.  Positive  results are indicative of active infection with SARS-CoV-2.  Clinical  correlation with patient history and other diagnostic information is  necessary to determine patient infection status.  Positive results do  not rule out bacterial infection or co-infection with other viruses. If result is  PRESUMPTIVE POSTIVE SARS-CoV-2 nucleic acids MAY BE PRESENT.   A presumptive positive result was obtained on the submitted specimen  and confirmed on repeat testing.  While 2019 novel coronavirus  (SARS-CoV-2) nucleic acids may be present in the submitted sample  additional confirmatory testing may be necessary for epidemiological  and / or clinical management purposes  to differentiate between  SARS-CoV-2 and other Sarbecovirus currently known to infect humans.  If clinically indicated additional testing with an alternate test  methodology [phone]) is advised. The SARS-CoV-2 RNA is generally  detectable in upper and lower respiratory sp ecimens during the acute  phase of infection. The expected result is Negative. Fact Sheet for Patients:  StrictlyIdeas.no Fact Sheet for Healthcare Providers: BankingDealers.co.za This test is not yet approved or cleared by the [place] FDA and has been authorized for detection and/or diagnosis of SARS-CoV-2 by FDA under an Emergency Use Authorization (EUA).  This EUA will remain in effect (meaning this test can be used) for the duration of the COVID-19 declaration under Section 564(b)(1) of the Act, 21 U.S.C. section 360bbb-3(b)(1), unless the authorization is terminated or revoked sooner. Performed at [facility], [place] [address], [place], [place] [address]   MRSA PCR Screening     Status: None   Collection Time: 04/20/19  9:42 AM  Result Value Ref Range Status   MRSA by PCR NEGATIVE NEGATIVE Final    Comment:        The GeneXpert MRSA Assay (FDA approved for NASAL specimens only), is one component of a comprehensive MRSA colonization surveillance program. It is not intended to diagnose MRSA infection nor to guide or monitor treatment for MRSA infections. Performed at [facility], [place] [address] [place], [place] [address]      Studies: Ct Head Wo Contrast  Addendum  Date: 04/25/2019   ADDENDUM REPORT: 04/25/2019 18:41 ADDENDUM: These results were called by telephone at the time of interpretation on 04/25/2019 at 6:41 pm to Dr. Flora Lipps , who verbally acknowledged these results. Electronically Signed   By: Constance Holster M.D.   On: 04/25/2019 18:41   Result Date: 04/25/2019 CLINICAL DATA:  Acute unexplained confusion. EXAM: CT HEAD WITHOUT CONTRAST TECHNIQUE: Contiguous axial images were obtained from the base of the skull through the vertex without intravenous contrast. COMPARISON:  04/19/2019 FINDINGS: Brain: There is new loss of gray-white differentiation involving approximately a 5.2 cm area of the left temporal lobe. There  is new loss of gray-white differentiation involving the right frontal lobe measuring approximately 3 cm. There is age related volume loss bilaterally. Chronic microvascular ischemic changes are noted. There is no midline shift or mass effect. Vascular: Hyperdensity involving both MCAs is favored to be secondary to calcifications. Skull: Normal. Negative for fracture or focal lesion. Sinuses/Orbits: No acute finding. The patient is status post bilateral cataract surgery Other: None. IMPRESSION: 1. New hypoattenuating areas with associated loss of gray-white differentiation involving the left temporal and right frontal lobes as detailed above. These are highly suspicious for developing acute infarcts. Follow-up with MRI is recommended. Given the different vascular territories, an embolic phenomenon should be considered. 2. No mass effect no intracranial hemorrhage identified. Addendum to be added once the ordering provider has been contacted. Electronically Signed: By: Constance Holster M.D. On: 04/25/2019 18:27    Scheduled Meds: . amLODipine  10 mg Oral Daily  . febuxostat  40 mg Oral Daily  . lidocaine  1 patch Transdermal Q24H  . LORazepam  1 mg Intravenous Once  . metoprolol succinate  37.5 mg Oral Daily  . polyethylene glycol  17  g Oral Daily    Continuous Infusions: . dextrose 50 mL/hr at 04/26/19 0153    Flora Lipps, MD  [facility] 04/26/2019

## 2019-04-27 ENCOUNTER — Inpatient Hospital Stay (HOSPITAL_COMMUNITY): Payer: Medicare Other

## 2019-04-27 DIAGNOSIS — I639 Cerebral infarction, unspecified: Secondary | ICD-10-CM

## 2019-04-27 DIAGNOSIS — I6340 Cerebral infarction due to embolism of unspecified cerebral artery: Secondary | ICD-10-CM

## 2019-04-27 LAB — BASIC METABOLIC PANEL
Anion gap: 8 (ref 5–15)
BUN: 32 mg/dL — ABNORMAL HIGH (ref 8–23)
CO2: 20 mmol/L — ABNORMAL LOW (ref 22–32)
Calcium: 9.3 mg/dL (ref 8.9–10.3)
Chloride: 109 mmol/L (ref 98–111)
Creatinine, Ser: 1.49 mg/dL — ABNORMAL HIGH (ref 0.44–1.00)
GFR calc Af Amer: 35 mL/min — ABNORMAL LOW (ref >60)
GFR calc non Af Amer: 31 mL/min — ABNORMAL LOW (ref >60)
Glucose, Bld: 104 mg/dL — ABNORMAL HIGH (ref 70–99)
Potassium: 4.2 mmol/L (ref 3.5–5.1)
Sodium: 137 mmol/L (ref 135–145)

## 2019-04-27 LAB — CBC
HCT: 28 % — ABNORMAL LOW (ref 36.0–46.0)
Hemoglobin: 8.6 g/dL — ABNORMAL LOW (ref 12.0–15.0)
MCH: 27.8 pg (ref 26.0–34.0)
MCHC: 30.7 g/dL (ref 30.0–36.0)
MCV: 90.6 fL (ref 80.0–100.0)
Platelets: 242 10*3/uL (ref 150–400)
RBC: 3.09 MIL/uL — ABNORMAL LOW (ref 3.87–5.11)
RDW: 17.2 % — ABNORMAL HIGH (ref 11.5–15.5)
WBC: 9.1 10*3/uL (ref 4.0–10.5)
nRBC: 0 % (ref 0.0–0.2)

## 2019-04-27 LAB — LIPID PANEL
Cholesterol: 134 mg/dL (ref 0–200)
HDL: 59 mg/dL (ref >40)
LDL Cholesterol: 55 mg/dL (ref 0–99)
Total CHOL/HDL Ratio: 2.3 RATIO
Triglycerides: 98 mg/dL (ref <150)
VLDL: 20 mg/dL (ref 0–40)

## 2019-04-27 LAB — HEMOGLOBIN A1C
Hgb A1c MFr Bld: 5.4 % (ref 4.8–5.6)
Mean Plasma Glucose: 108.28 mg/dL

## 2019-04-27 LAB — MAGNESIUM: Magnesium: 2.4 mg/dL (ref 1.7–2.4)

## 2019-04-27 MED ORDER — FOSFOMYCIN TROMETHAMINE 3 G PO PACK
3.0000 g | PACK | Freq: Once | ORAL | Status: DC
  Filled 2019-04-27: qty 3

## 2019-04-27 MED ORDER — OXYCODONE-ACETAMINOPHEN 5-325 MG PO TABS: 1.0000 | ORAL_TABLET | Freq: Four times a day (QID) | ORAL | Status: DC | PRN

## 2019-04-27 NOTE — Progress Notes (Addendum)
PROGRESS NOTE  Elizabeth Burch WGY:[id number] DOB: [birth year] DOA: 04/19/2019 PCP: Chipper Herb, MD   LOS: 7 days   Brief narrative: This is a 85 year old female with history of stage IV kidney disease, history of DVT on Coumadin, gout, hypertension who lives independently at home was admitted following a fall, patient couldnot recall the events preceding the fall, syncope suspected. In the emergency room, labs noted creatinine of 1.8, SARS COVID-19 PCR was negative, chest x-ray noted multiple minimally displaced fractures of left ribs 5 through 9. CT abdomen pelvis did not show any other abnormality, x-rays of her pelvis were unremarkable.  Subjective: Patient has not improved clinically, barely responsive.  Unable to perform speech evaluation due to lethargy.  Assessment/Plan:  Principal Problem:   Syncope Active Problems:   Essential hypertension   CKD (chronic kidney disease), stage IV (HCC)   Gout   History of DVT of lower extremity   Chronic anticoagulation   Multiple closed fractures of ribs of left side   Frequent falls   Goals of care, counseling/discussion   Palliative care by specialist   Cerebral embolism with cerebral infarction  Acute encephalopathy likely secondary to multifocal CVA.  CT scan showed hypodensities.  MRI of the brain was performed which shows acute infarct of the right frontal lobe and left posterior temporal lobe.    Neurology on board.  Patient is at high risk for bleeding on anticoagulation.  Aspirin has been initiated.  No clinical improvement in her mentation.  I spoke with neurology regarding the patient.  Neurology recommends repeating CT scan/CT angiograph to rule out carotid stenosis.  I spoke with the patient's son on the phone regarding this and at this time he would like to observe for 1 more day to see how she does and decide on further course of treatment.  We will repeat plain CT head scan for tomorrow.  Multiple left rib fractures 5- 9  status post fall and trauma. Minimally displaced.  Continue conservative treatment including incentive spirometry. Coumadin on hold  due to remote DVT, frequent falls and risk of hemothorax acutely in this setting, last Doppler from 2014 showed chronic DVT.   Has been seen by physical therapy who recommended skilled nursing facility placement but with worsening mental situation, not sure whether the patient will be able to participate.  Continue Lidoderm patch  Urinary retention.  Continue foley cath.   E Coli UTI. Allergy to cephalosporins and Cipro.  Will give 1 dose of fosfomycin if able.  Mild cognitive decline, failure to thrive on presentation.  Now with significant trauma and multiple CVA, prognosis seems to be guarded.  Fall/frequent falls -Possible syncope. No events on telemetry, echocardiogram with preserved EF, no significant findings. Continue fall precautions.     History of DVT, atrial fibrillation on chronic Coumadin Not on anticoagulation in the setting of stroke with large area.  On aspirin now.  Stage IV chronic kidney disease  monitor BMP closely.  Creatinine of 1.4 today.  On D5 water  Hypertension -Continue amlodipine, Toprol .  Blood pressure is reasonably controlled.   Gout -Stable.    VTE Prophylaxis: SCD, coumadin on hold  Code Status: DNR  Family Communication: I again had a prolonged discussion with the patient's son on the phone today.  He would like to continue the current level of care for today and will discuss about goals of care by tomorrow if she does not improve.  Disposition Plan: Skilled nursing facility as per physical therapy  recommendation but pending clinical improvement..  The prognosis of the patient is poor in the context of multiple rib trauma, multifocal CVA and nonimprovement of symptoms.  Will have a discussion about palliative care/hospice if the patient continues to decline or does not improve  Consultants:   Trauma surgery    Palliative care   Neurology  Procedures:  None  Antibiotics: Anti-infectives (From admission, onward)   None      Objective: Vitals:   04/27/19 0400 04/27/19 0955  BP: (!) 118/31 (!) 113/33  Pulse:  (!) 50  Resp: (!) 21 18  Temp: (!) 97.5 F (36.4 C) 98.2 F (36.8 C)  SpO2: 100% 100%    Intake/Output Summary (Last 24 hours) at 04/27/2019 1045 Last data filed at 04/27/2019 1000 Gross per 24 hour  Intake 840.57 ml  Output 675 ml  Net 165.57 ml   Filed Weights   04/19/19 2343 04/25/19 0524 04/26/19 0514  Weight: 66.4 kg 66.8 kg 68.8 kg   Body mass index is 27.74 kg/m.   Physical Exam: GENERAL:, minimally responsive to physical stimuli, mildly agitated at times. HENT: No scleral pallor or icterus. Pupils equally reactive to light. Oral mucosa is dry NECK: is supple, no palpable thyroid enlargement. CHEST:  Diminished breath sounds bilaterally.  CVS: S1 and S2 heard, no murmur.  No pericardial rub. ABDOMEN: Soft, non-tender, bowel sounds are present. No palpable hepato-splenomegaly.  Foley catheter in place EXTREMITIES.  No edema.   CNS: somnolent, not much responsive, garbled speech. SKIN: warm and dry  Data Review: I have personally reviewed the following laboratory data and studies,  CBC: Recent Labs  Lab 04/23/19 0336 04/24/19 0307 04/25/19 0322 04/26/19 0331 04/27/19 0351  WBC 10.7* 8.4 7.0 9.1 9.1  HGB 8.8* 9.6* 9.1* 9.4* 8.6*  HCT 28.4* 30.7* 28.4* 29.8* 28.0*  MCV 91.3 90.3 89.3 89.5 90.6  PLT 191 204 192 256 502   Basic Metabolic Panel: Recent Labs  Lab 04/23/19 0336 04/24/19 0307 04/25/19 0322 04/26/19 0331 04/27/19 0351  NA 143 141 139 138 137  K 4.1 3.7 4.0 4.3 4.2  CL 116* 115* 111 110 109  CO2 20* 19* 20* 19* 20*  GLUCOSE 108* 106* 101* 98 104*  BUN 32* 31* 36* 31* 32*  CREATININE 1.33* 1.26* 1.50* 1.51* 1.49*  CALCIUM 9.5 9.3 9.1 9.3 9.3  MG  --   --  2.3  --  2.4   Liver Function Tests: No results for input(s): AST, ALT,  ALKPHOS, BILITOT, PROT, ALBUMIN in the last 168 hours. No results for input(s): LIPASE, AMYLASE in the last 168 hours. No results for input(s): AMMONIA in the last 168 hours. Cardiac Enzymes: No results for input(s): CKTOTAL, CKMB, CKMBINDEX, TROPONINI in the last 168 hours. BNP (last 3 results) Recent Labs    12/18/18 1021  BNP 386.5*    ProBNP (last 3 results) No results for input(s): PROBNP in the last 8760 hours.  CBG: No results for input(s): GLUCAP in the last 168 hours. Recent Results (from the past 240 hour(s))  SARS Coronavirus 2 (CEPHEID - Performed in [facility] lab), Hosp Order     Status: None   Collection Time: 04/19/19  9:33 PM  Result Value Ref Range Status   SARS Coronavirus 2 NEGATIVE NEGATIVE Final    Comment: (NOTE) If result is NEGATIVE SARS-CoV-2 target nucleic acids are NOT DETECTED. The SARS-CoV-2 RNA is generally detectable in upper and lower  respiratory specimens during the acute phase of infection. The lowest  concentration of SARS-CoV-2 viral copies this assay can detect is 250  copies / mL. A negative result does not preclude SARS-CoV-2 infection  and should not be used as the sole basis for treatment or other  patient management decisions.  A negative result may occur with  improper specimen collection / handling, submission of specimen other  than nasopharyngeal swab, presence of viral mutation(s) within the  areas targeted by this assay, and inadequate number of viral copies  (<250 copies / mL). A negative result must be combined with clinical  observations, patient history, and epidemiological information. If result is POSITIVE SARS-CoV-2 target nucleic acids are DETECTED. The SARS-CoV-2 RNA is generally detectable in upper and lower  respiratory specimens dur ing the acute phase of infection.  Positive  results are indicative of active infection with SARS-CoV-2.  Clinical  correlation with patient history and other diagnostic  information is  necessary to determine patient infection status.  Positive results do  not rule out bacterial infection or co-infection with other viruses. If result is PRESUMPTIVE POSTIVE SARS-CoV-2 nucleic acids MAY BE PRESENT.   A presumptive positive result was obtained on the submitted specimen  and confirmed on repeat testing.  While 2019 novel coronavirus  (SARS-CoV-2) nucleic acids may be present in the submitted sample  additional confirmatory testing may be necessary for epidemiological  and / or clinical management purposes  to differentiate between  SARS-CoV-2 and other Sarbecovirus currently known to infect humans.  If clinically indicated additional testing with an alternate test  methodology [phone]) is advised. The SARS-CoV-2 RNA is generally  detectable in upper and lower respiratory sp ecimens during the acute  phase of infection. The expected result is Negative. Fact Sheet for Patients:  StrictlyIdeas.no Fact Sheet for Healthcare Providers: BankingDealers.co.za This test is not yet approved or cleared by the [place] FDA and has been authorized for detection and/or diagnosis of SARS-CoV-2 by FDA under an Emergency Use Authorization (EUA).  This EUA will remain in effect (meaning this test can be used) for the duration of the COVID-19 declaration under Section 564(b)(1) of the Act, 21 U.S.C. section 360bbb-3(b)(1), unless the authorization is terminated or revoked sooner. Performed at [facility], [place] [address] [place], [place] [address]   MRSA PCR Screening     Status: None   Collection Time: 04/20/19  9:42 AM  Result Value Ref Range Status   MRSA by PCR NEGATIVE NEGATIVE Final    Comment:        The GeneXpert MRSA Assay (FDA approved for NASAL specimens only), is one component of a comprehensive MRSA colonization surveillance program. It is not intended to diagnose MRSA infection nor to guide or  monitor treatment for MRSA infections. Performed at [facility], [place] [address] [place], [place] [address]   Culture, Urine     Status: Abnormal (Preliminary result)   Collection Time: 04/25/19  9:55 PM  Result Value Ref Range Status   Specimen Description URINE, CATHETERIZED  Final   Special Requests NONE  Final   Culture (A)  Final    >=100,000 COLONIES/mL ESCHERICHIA COLI SUSCEPTIBILITIES TO FOLLOW Performed at [facility], [place] [address] [place], [place] [address]    Report Status PENDING  Incomplete  Culture, Urine     Status: Abnormal (Preliminary result)   Collection Time: 04/26/19  7:59 AM  Result Value Ref Range Status   Specimen Description URINE, CATHETERIZED  Final   Special Requests NONE  Final  Culture (A)  Final    >=100,000 COLONIES/mL ESCHERICHIA COLI SUSCEPTIBILITIES TO FOLLOW Performed at [facility] [address], [place], [place] [address]    Report Status PENDING  Incomplete     Studies: Ct Head Wo Contrast  Addendum Date: 04/25/2019   ADDENDUM REPORT: 04/25/2019 18:41 ADDENDUM: These results were called by telephone at the time of interpretation on 04/25/2019 at 6:41 pm to Dr. Flora Lipps , who verbally acknowledged these results. Electronically Signed   By: Constance Holster M.D.   On: 04/25/2019 18:41   Result Date: 04/25/2019 CLINICAL DATA:  Acute unexplained confusion. EXAM: CT HEAD WITHOUT CONTRAST TECHNIQUE: Contiguous axial images were obtained from the base of the skull through the vertex without intravenous contrast. COMPARISON:  04/19/2019 FINDINGS: Brain: There is new loss of gray-white differentiation involving approximately a 5.2 cm area of the left temporal lobe. There is new loss of gray-white differentiation involving the right frontal lobe measuring approximately 3 cm. There is age related volume loss bilaterally. Chronic microvascular ischemic changes are noted. There is no midline shift or mass effect. Vascular:  Hyperdensity involving both MCAs is favored to be secondary to calcifications. Skull: Normal. Negative for fracture or focal lesion. Sinuses/Orbits: No acute finding. The patient is status post bilateral cataract surgery Other: None. IMPRESSION: 1. New hypoattenuating areas with associated loss of gray-white differentiation involving the left temporal and right frontal lobes as detailed above. These are highly suspicious for developing acute infarcts. Follow-up with MRI is recommended. Given the different vascular territories, an embolic phenomenon should be considered. 2. No mass effect no intracranial hemorrhage identified. Addendum to be added once the ordering provider has been contacted. Electronically Signed: By: Constance Holster M.D. On: 04/25/2019 18:27   Mr Jodene Nam Head Wo Contrast  Result Date: 04/26/2019 CLINICAL DATA:  Stroke follow-up. Acute right frontal and posterior left temporal infarcts on MRI. EXAM: MRA HEAD WITHOUT CONTRAST TECHNIQUE: Angiographic images of the Circle of Willis were obtained using MRA technique without intravenous contrast. COMPARISON:  None. FINDINGS: The study is mildly motion degraded. The visualized distal vertebral arteries are widely patent to the basilar and codominant. Patent PICA and SCA origins are identified bilaterally. The basilar artery is widely patent. There is a large right posterior communicating artery with hypoplastic right P1 segment. There is a bulbous appearance at the origin of the right P1 segment from the basilar artery measuring 2 mm. Focal transverse signal loss at the right P1-P2 junction may be artifactual although an underlying stenosis is possible. The left PCA is patent without evidence of significant proximal stenosis. There is a lack of flow related enhancement in the included distal segment and majority of the intracranial portion of the right ICA. There is distal reconstitution of the ICA at the level of the posterior communicating artery.  Of note, a normal right ICA flow void was present on the MRI earlier today. The intracranial left ICA is patent with mild irregularity but no evidence of significant stenosis. The ACAs and MCAs are patent proximally. There is mild narrowing of the distal right M1 segment. Branch vessel evaluation is limited by motion artifact, however there do appear to be missing right MCA superior division branch vessels corresponding to the infarct on MRI. The left MCA has an early bifurcation without significant M1 or proximal M2 stenosis. No significant proximal ACA stenosis is identified within limitations of motion. IMPRESSION: 1. Motion degraded study. 2. Occlusion versus severely reduced flow in the right ICA with reconstitution  via the posterior communicating artery. A normal flow void was present on the MRI earlier today. Head and neck CTA is recommended for further evaluation. 3. Suspected missing right MCA superior division branch vessels corresponding to the area of infarct on MRI. 4. 2 mm aneurysm versus infundibulum or motion artifact at the right PCA origin. This can also be further evaluated with CTA. Electronically Signed   By: Logan Bores M.D.   On: 04/26/2019 18:04   Mr Brain Wo Contrast  Result Date: 04/26/2019 CLINICAL DATA:  Stroke EXAM: MRI HEAD WITHOUT CONTRAST TECHNIQUE: Multiplanar, multiecho pulse sequences of the brain and surrounding structures were obtained without intravenous contrast. COMPARISON:  CT head 04/25/2019 FINDINGS: Brain: Acute infarcts in the right frontal lobe and left posterior temporal lobe. These are moderately large cortically based infarct. No associated hemorrhage. Moderate chronic ischemic changes throughout the white matter and pons. Negative for hydrocephalus. Negative for hemorrhage or mass. Vascular: Normal arterial flow voids Skull and upper cervical spine: Negative Sinuses/Orbits: Mild mucosal edema paranasal sinuses. Bilateral cataract surgery Other: None IMPRESSION:  Acute infarct right frontal lobe and left posterior temporal lobe. Negative for hemorrhage Moderate chronic microvascular ischemic change. Electronically Signed   By: Franchot Gallo M.D.   On: 04/26/2019 11:01    Scheduled Meds: . amLODipine  10 mg Oral Daily  . aspirin  300 mg Rectal Daily   Or  . aspirin  325 mg Oral Daily  . febuxostat  40 mg Oral Daily  . lidocaine  1 patch Transdermal Q24H  . metoprolol succinate  37.5 mg Oral Daily  . polyethylene glycol  17 g Oral Daily    Continuous Infusions: . dextrose 50 mL/hr at 04/27/19 0400    Flora Lipps, MD  [facility] 04/27/2019

## 2019-04-27 NOTE — Progress Notes (Signed)
VASCULAR LAB PRELIMINARY  PRELIMINARY  PRELIMINARY  PRELIMINARY  Carotid duplex completed.    Preliminary report:  See CV proc for preliminary results.   Denae Zulueta, RVT 04/27/2019, 11:23 AM

## 2019-04-27 NOTE — Progress Notes (Addendum)
STROKE TEAM PROGRESS NOTE   HISTORY OF PRESENT ILLNESS (per record) Elizabeth Burch is a 85 y.o. female with history of stage IV kidney disease, history of DVT on Coumadin, gout, hypertension who lives independently at home was admitted on 04/19/2019, following a fall, patient couldnot recall the events preceding the fall, syncope suspected.  She sustained multiple rib fractures, was evaluated by trauma surgery and her anticoagulation was held because of multiple rib fractures.  History obtained form medical record and RN.  As per RN patient became confused, agitated and developed aphasia last pm (04/25/2019) around 1700.  Ct scan was done  and revealed new hypoattenuating areas with associated loss of gray-white differentiation involving the left temporal and right frontal lobes.  Neurology was consulted for abnormal imaging findings in the setting of altered mental status.  History obtained from family over the phone reports a few days worth of confusion and forgetfulness but a sudden change with the syncopal episode on the day of presentation.  Last known well: Likely many days ago based on family's history. tpa given?: no, definitely past the window   SUBJECTIVE (INTERVAL HISTORY) Her nurse is at bedside. Patient resists eye opening then opens eyes. Bo blink to threat on left, blinks to threat on the right. Patient cannot provide any information.  She has a past medical history of chronic kidney disease, deep vein thrombosis, hypertension, palpitations, peripheral vascular disease.  She was admitted on May 16 following a fall and sustained multiple rib fractures.  Code stroke was called for aphasia and mental status changes and abnormal imaging findings found.  Patient was on Coumadin at home.  On admission was having episodes of bradycardia.  Family stating she has "not been herself over the last 2 weeks" since her fall.  Poor historian possibly baseline dementia.  On Coumadin at home for DVT,  on hold inpatient due to INR 3.3 and trauma after fall and multiple risk factors for bleeding.      OBJECTIVE Vitals:   04/26/19 2000 04/26/19 2308 04/27/19 0000 04/27/19 0400  BP: 129/60  (!) 90/35 (!) 118/31  Pulse:      Resp: (!) 28 (!) 23 20 (!) 21  Temp:  (!) 97.1 F (36.2 C)  (!) 97.5 F (36.4 C)  TempSrc:  Axillary  Axillary  SpO2: 100% 94% 97% 100%  Weight:      Height:        CBC:  Recent Labs  Lab 04/26/19 0331 04/27/19 0351  WBC 9.1 9.1  HGB 9.4* 8.6*  HCT 29.8* 28.0*  MCV 89.5 90.6  PLT 256 725    Basic Metabolic Panel:  Recent Labs  Lab 04/25/19 0322 04/26/19 0331 04/27/19 0351  NA 139 138 137  K 4.0 4.3 4.2  CL 111 110 109  CO2 20* 19* 20*  GLUCOSE 101* 98 104*  BUN 36* 31* 32*  CREATININE 1.50* 1.51* 1.49*  CALCIUM 9.1 9.3 9.3  MG 2.3  --  2.4    Lipid Panel:     Component Value Date/Time   CHOL 134 04/27/2019 0351   CHOL 228 (H) 01/01/2019 1107   CHOL 223 (H) 02/04/2014 0807   TRIG 98 04/27/2019 0351   TRIG 121 08/21/2016 0800   TRIG 82 02/04/2014 0807   HDL 59 04/27/2019 0351   HDL 119 01/01/2019 1107   HDL 93 08/21/2016 0800   HDL 84 02/04/2014 0807   CHOLHDL 2.3 04/27/2019 0351   VLDL 20 04/27/2019 0351   LDLCALC  55 04/27/2019 0351   LDLCALC 81 01/01/2019 1107   LDLCALC 121 (H) 06/23/2014 0806   LDLCALC 123 (H) 02/04/2014 0807   HgbA1c:  Lab Results  Component Value Date   HGBA1C 5.4 04/27/2019   Urine Drug Screen: No results found for: LABOPIA, COCAINSCRNUR, LABBENZ, AMPHETMU, THCU, LABBARB  Alcohol Level No results found for: ETH  IMAGING  Ct Head Wo Contrast 04/25/2019   IMPRESSION:  1. New hypoattenuating areas with associated loss of gray-white differentiation involving the left temporal and right frontal lobes as detailed above. These are highly suspicious for developing acute infarcts. Follow-up with MRI is recommended. Given the different vascular territories, an embolic phenomenon should be considered.  2. No  mass effect no intracranial hemorrhage identified.    Mr Jodene Nam Head Wo Contrast 04/26/2019 IMPRESSION:  1. Motion degraded study.  2. Occlusion versus severely reduced flow in the right ICA with reconstitution via the posterior communicating artery. A normal flow void was present on the MRI earlier today. Head and neck CTA is recommended for further evaluation.  3. Suspected missing right MCA superior division branch vessels corresponding to the area of infarct on MRI.  4. 2 mm aneurysm versus infundibulum or motion artifact at the right PCA origin. This can also be further evaluated with CTA.    Mr Brain Wo Contrast 04/26/2019 IMPRESSION:  Acute infarct right frontal lobe and left posterior temporal lobe. Negative for hemorrhage Moderate chronic microvascular ischemic change.    Transthoracic Echocardiogram  04/20/2019 IMPRESSIONS  1. The left ventricle has normal systolic function with an ejection fraction of 60-65%. The cavity size was normal. Moderate basal septal hypertrophy. Left ventricular diastolic Doppler parameters are consistent with impaired relaxation. No evidence of  left ventricular regional Coviello motion abnormalities.  2. The right ventricle has normal systolic function. The cavity was normal. There is no increase in right ventricular Gradilla thickness.  3. Left atrial size was mildly dilated.  4. Right atrial size was moderately dilated.  5. There is mild mitral annular calcification present.  6. Tricuspid valve regurgitation is moderate.  7. The aortic valve is tricuspid. Mild sclerosis of the aortic valve. Aortic valve regurgitation was not assessed by color flow Doppler.   Left Carotid Doppler 04/27/2019 Summary: Left Carotid: Velocities in the left ICA are consistent with a 1-39% stenosis. Vertebrals:  Left vertebral artery demonstrates antegrade flow. Subclavians: Normal flow hemodynamics were seen in bilateral subclavian              arteries.   EKG 04/22/2019 -  atrial fibrillation - ventricular response 62 BPM     PHYSICAL EXAM Blood pressure (!) 118/31, pulse 77, temperature (!) 97.5 F (36.4 C), temperature source Axillary, resp. rate (!) 21, height 5\' 2"  (1.575 m), weight 68.8 kg, SpO2 100 %.   She is an elderly female who is very well groomed, normal rate with a regular rhythm, nonlabored breathing normal respiratory rate, conjunctiva clear without injection, patient has eyes open, she is not responsive, globally aphasic, she does not follow commands, blinks to threat on the right, does not blink to threat on the left, pupils are equally round and minimally reactive/sluggish, appears to have a right gaze preference, no overt facial weakness, cannot visualize tongue or palate and mouth, tone appears increased bilaterally, she does move all extremities and withdraws x4.        ASSESSMENT/PLAN Elizabeth Burch is a 85 y.o. female with history of stage IV kidney disease, ASPVD, history of DVT  on Coumadin, gout, and hypertension admitted to [facility] with rib fractures (coumadin held) 04/19/2019 after falling at home (? syncope). She had acute onset of aphasia and AMS 04/25/2019.  She did not receive IV t-PA due to late presentation (>4.5 hours from time of onset).  Stroke:  bilateral infarcts - embolic - atrial fibrillation on EKG 04/22/2019, due to new onset afib  CT head - New hypoattenuating areas with associated loss of gray-white differentiation involving the left temporal and right frontal lobes  MRI head - acute infarct right frontal lobe and left posterior temporal lobe.  MRA head - Occlusion versus severely reduced flow in the right ICA with reconstitution via the posterior communicating artery. Suspected missing right MCA superior division branch vessels corresponding to the area of infarct on MRI.   Left Carotid Doppler - Velocities in the left ICA are consistent with a 1-39% stenosis.  2D Echo  - EF 60 - 65%. No cardiac source of emboli  identified.   Sars Corona Virus 2 - negative  LDL - 55  HgbA1c - 5.4  UDS - not performed  VTE prophylaxis - none  Diet - NPO  warfarin daily prior to admission, now on aspirin 325 mg daily  Patient counseled to be compliant with her antithrombotic medications. Consider DOAC for afib.  Ongoing aggressive stroke risk factor management  Therapy recommendations:  pending  Disposition:  Pending  Hypertension  BP low at times Consider stopping Norvasc and / or decreasing metoprolol in setting of possible Rt ICA stenosis.  Marland Kitchen Permissive hypertension (OK if < 220/120) but gradually normalize in 5-7 days . Long-term BP goal normotensive  Hyperlipidemia  Lipid lowering medication PTA:  none  LDL 55, goal < 70  Current lipid lowering medication: none  Continue statin at discharge   Other Stroke Risk Factors  Advanced age   Other Active Problems   2 mm aneurysm versus infundibulum or motion artifact at the right PCA origin  Occlusion versus severely reduced flow in the right ICA.   Creatinine - 1.5->1.51->1.49  Anemia - 9.4->8.6  IV dextrose (ordered for Monday) is relatively contra-indicated in stroke pts.  Palliative care consult -> agreeable to rehab if recommended - family specifically requests [facility]  Atrial fibrillation - likely stroke etiology - consider need to consider anticoagulation but post pone for now due to size of strokes. Can start DOAC in 5 days from neurology perspective.   PLAN: - Discussed with attending, can start Sims in 5 days.  -Occlusion versus severely reduced flow in the right ICA.  Recommend permissive HTN, keep >563 systolic, avoid hypotension - Afib  - likely stroke etiology for stroke - consider anticoagulation with DOAC - Consider LE dopplers for DVTs.  - BP low at times Consider stopping Norvasc and / or decreasing metoprolol in setting of possible Rt ICA stenosis.   Discussed with Dr Louanne Belton - he will talk with  family to see how aggressive they would like to be.  Stroke will sign off at this time.   Hospital day # 7  Personally examined patient and images, and have participated in and made any corrections needed to history, physical, neuro exam,assessment and plan as stated above.  I have personally obtained the history, evaluated lab date, reviewed imaging studies and agree with radiology interpretations.    Sarina Ill, MD Stroke Neurology   A total of 35 minutes was spent for the care of this patient, spent on counseling patient and family on different  diagnostic and therapeutic options, counseling and coordination of care, riskd ans benefits of management, compliance, or risk factor reduction and education.     To contact Stroke Continuity provider, please refer to [email or web address] After hours, contact General Neurology

## 2019-04-27 NOTE — Progress Notes (Signed)
VASCULAR LAB PRELIMINARY  PRELIMINARY  PRELIMINARY  PRELIMINARY  Bilateral lower extremity venous duplex completed.    Preliminary report:  See CV proc for preliminary results.  Jacy Brocker, RVT 04/27/2019, 4:02 PM

## 2019-04-27 NOTE — Progress Notes (Signed)
Physical Therapy Treatment- reevaluation Patient Details Name: Elizabeth Burch MRN: [id number] DOB: [birth year] Today's Date: 04/27/2019    History of Present Illness Patient is a 85 y/o female presenting to the ED on 04/19/19 s/p fall with left 5-9 rib fx. Initial head CT unremarkable, pt with increased lethargy on admission with MRI 5/23 demonstrating acute left temporal and Rt frontal infarcts. PMHx: HTN, CKD stage IV, history of DVT on Coumadin, and gout.     PT Comments    Pt with lethargic on arrival able to be awoken. Pt with right neck rotation, right upward stare with ability to achieve midline but did not see pt cross visual midline during session. Pt able to move bil UE with assist with decreased grip and command following on left. Pt with increased extensor tone LLE with total assist to achieve knee flexion and break tone with decreased response to noxious stimuli LLE. Pt with total assist for rolling and bed mobility with pt unable to follow commands to assist with mobility and transfers. Pt with significant decline in function with new infarcts. Pt continues to require +2 total assist and SNF remains appropriate with goals downgraded to reflect change. Will continue to follow acutely to maximize strength, function and balance.    HR 52-63 BP 123/41 (62) SPO2 95% on RA   Follow Up Recommendations  SNF;Supervision/Assistance - 24 hour     Equipment Recommendations  Hospital bed;Wheelchair (measurements PT)    Recommendations for Other Services       Precautions / Restrictions Precautions Precautions: Fall Precaution Comments: rib fx-L side, aphasia, increased tone LLE  SBP <140   Mobility  Bed Mobility Overal bed mobility: Needs Assistance Bed Mobility: Rolling Rolling: Total assist         General bed mobility comments: total assist to roll with +2 assist for changing bed pad and sliding up in bed. Pt with resistance to LLE knee flexion with increased tone and  hip ADDuction increased tone with pericare.   Transfers                 General transfer comment: did not attempt this session with lack of command following and total assist for rolling  Ambulation/Gait                 Stairs             Wheelchair Mobility    Modified Rankin (Stroke Patients Only) Modified Rankin (Stroke Patients Only) Pre-Morbid Rankin Score: No symptoms Modified Rankin: Severe disability     Balance                                            Cognition Arousal/Alertness: Lethargic Behavior During Therapy: Flat affect Overall Cognitive Status: Difficult to assess Area of Impairment: Following commands;Memory;Problem solving                               General Comments: pt with eyes closed on arrival able to arouse once name called and RUE lifted. Pt stating "yeah" and "uh huh" as only response and not appropriate. Pt would assist with RUE with cues and repetition but did not follow any other command      Exercises General Exercises - Upper Extremity Shoulder Flexion: AAROM;10 reps;Supine;Right    General Comments  Pertinent Vitals/Pain Pain Location: grimace and groan with rolling Pain Descriptors / Indicators: Moaning;Grimacing Pain Intervention(s): Limited activity within patient's tolerance;Monitored during session;Repositioned    Home Living                      Prior Function            PT Goals (current goals can now be found in the care plan section) Acute Rehab PT Goals PT Goal Formulation: Patient unable to participate in goal setting Time For Goal Achievement: 05/11/19 Potential to Achieve Goals: Poor Progress towards PT goals: Goals downgraded-see care plan    Frequency    Min 3X/week      PT Plan Current plan remains appropriate    Co-evaluation              AM-PAC PT "6 Clicks" Mobility   Outcome Measure  Help needed turning from your  back to your side while in a flat bed without using bedrails?: Total Help needed moving from lying on your back to sitting on the side of a flat bed without using bedrails?: Total Help needed moving to and from a bed to a chair (including a wheelchair)?: Total Help needed standing up from a chair using your arms (e.g., wheelchair or bedside chair)?: Total Help needed to walk in hospital room?: Total Help needed climbing 3-5 steps with a railing? : Total 6 Click Score: 6    End of Session   Activity Tolerance: Patient limited by fatigue Patient left: in bed;with call bell/phone within reach;with nursing/sitter in room;with bed alarm set Nurse Communication: Mobility status;Precautions;Need for lift equipment PT Visit Diagnosis: Unsteadiness on feet (R26.81);Other abnormalities of gait and mobility (R26.89);Muscle weakness (generalized) (M62.81);History of falling (Z91.81);Other symptoms and signs involving the nervous system (K06.770)     Time: 3403-5248 PT Time Calculation (min) (ACUTE ONLY): 21 min  Charges:                        Elwyn Reach, PT Acute Rehabilitation Services Pager: [phone] Office: [place] 04/27/2019, 2:19 PM

## 2019-04-27 NOTE — Progress Notes (Signed)
Received new order for OT, pt already on caseload, will continue to follow.   Cathy Chasiti Waddington, OTR/L Acute Rehab Services Pager [phone] Office [phone]   

## 2019-04-27 NOTE — Plan of Care (Signed)
  Problem: Education: Goal: Knowledge of disease or condition will improve Outcome: Not Progressing   

## 2019-04-27 NOTE — Progress Notes (Signed)
SLP Cancellation Note  Patient Details Name: Elizabeth Burch MRN: [id number] DOB: 1926/01/17   Cancelled treatment:        Fatigue/lethargy limiting ability to participate. ST will retry tomorrow. Charlynne Cousins Isella Slatten, MA, CCC-SLP 04/27/2019 10:17 AM

## 2019-04-28 ENCOUNTER — Encounter (HOSPITAL_COMMUNITY): Payer: Self-pay

## 2019-04-28 LAB — URINE CULTURE
Culture: >=100000 — AB
Culture: >=100000 — AB

## 2019-04-28 NOTE — Progress Notes (Addendum)
PROGRESS NOTE  Elizabeth Burch PPI:[id number] DOB: 1926/06/04 DOA: 04/19/2019 PCP: Chipper Herb, MD   LOS: 8 days   Brief narrative: This is a 85 year old female with history of stage IV kidney disease, history of DVT on Coumadin, gout, hypertension who lives independently at home was admitted following a fall, patient couldnot recall the events preceding the fall, syncope suspected. In the emergency room, labs noted creatinine of 1.8, SARS COVID-19 PCR was negative, chest x-ray noted multiple minimally displaced fractures of left ribs 5 through 9. CT abdomen pelvis did not show any other abnormality, x-rays of her pelvis were unremarkable.  Subjective:  Patient is a little more responsive today answering yes or no. Assessment/Plan:  Principal Problem:   Syncope Active Problems:   Essential hypertension   CKD (chronic kidney disease), stage IV (HCC)   Gout   History of DVT of lower extremity   Chronic anticoagulation   Multiple closed fractures of ribs of left side   Frequent falls   Goals of care, counseling/discussion   Palliative care by specialist   Cerebral embolism with cerebral infarction  Acute metabolic encephalopathy likely secondary to multifocal CVA.  CT scan showed hypodensities.  MRI of the brain was performed which shows acute infarct of the right frontal lobe and left posterior temporal lobe.    Neurology on board.  Patient is at high risk for bleeding on anticoagulation at this time and ideally would reconsider starting anticoagulation 5 days after the stroke..  Aspirin has been initiated.  Patient is slightly more responsive today.  I spoke with neurology regarding the patient again today.  At this time, patient instructions today multifocal distribution and likely from embolic stroke.  I have spoken with the family about it.  We will continue to watch closely regarding her progress in the next few days.  Considering skilled nursing facility if she continues to  improve.  Multiple left rib fractures 5- 9 status post fall and trauma. Minimally displaced.  Continue conservative treatment including incentive spirometry. Coumadin on hold  due to remote DVT, frequent falls and risk of hemothoraxacutely in thissetting and a new stroke at this time. Last Doppler from 2014 showed chronic DVT.   Has been seen by physical therapy who recommended skilled nursing facility placement.  Hopefully her mentation is improved and be able to participate in physical therapy.  Urinary retention.  Continue foley cath.  Discontinue when able.    E Coli UTI. Allergy to cephalosporins and Cipro.  Will give 1 dose of fosfomycin if able but has impaired p.o. ability.  Mild cognitive decline, failure to thrive on presentation.  Now with significant trauma and multiple CVA, prognosis seems to be guarded.  Fall/frequent falls -Possible syncope. No events on telemetry, echocardiogram with preserved EF, no significant findings. Continue fall precautions.     History of DVT, atrial fibrillation on chronic Coumadin Not on anticoagulation in the setting of stroke with large area.  On aspirin now.  Considering anticoagulation if she improves in 5 days of onset of stroke  Stage IV chronic kidney disease  monitor BMP closely.  Creatinine of 1.4 today.  On D5 water  Hypertension -Continue  Toprol .  Amlodipine has been discontinued to permit slight hypertension.  Will however need metoprolol for rate control.   Gout -Stable.    VTE Prophylaxis: SCD, coumadin on hold  Code Status: DNR  Family Communication: I spoke with the patient's son on the phone today and updated him about the clinical  condition of the patient.  Disposition Plan: Skilled nursing facility as per physical therapy recommendation depending upon clinical progress.  Consultants:   Trauma surgery   Palliative care   Neurology  Procedures:  None  Antibiotics: Anti-infectives (From  admission, onward)   None      Objective: Vitals:   04/28/19 1159 04/28/19 1203  BP: 98/70   Pulse: 75   Resp: (!) 29 20  Temp: (!) 97.1 F (36.2 C)   SpO2: 93%     Intake/Output Summary (Last 24 hours) at 04/28/2019 1533 Last data filed at 04/28/2019 0546 Gross per 24 hour  Intake 649.33 ml  Output 1200 ml  Net -550.67 ml   Filed Weights   04/19/19 2343 04/25/19 0524 04/26/19 0514  Weight: 66.4 kg 66.8 kg 68.8 kg   Body mass index is 27.74 kg/m.   Physical Exam: GENERAL:, Patient is little more responsive today saying yes or no to verbal command.   HENT: No scleral pallor or icterus. Pupils equally reactive to light. Oral mucosa is dry NECK: is supple, no palpable thyroid enlargement. CHEST:  Diminished breath sounds bilaterally.  CVS: S1 and S2 heard, no murmur.  Irregular rhythm.  No pericardial rub. ABDOMEN: Soft, non-tender, bowel sounds are present. No palpable hepato-splenomegaly.  Foley catheter in place EXTREMITIES.  No edema.   CNS: More alert awake today.  Expressive aphasia.  Left side seems to be more weak than the right but difficult to fully evaluate. SKIN: warm and dry   Data Review: I have personally reviewed the following laboratory data and studies,  CBC: Recent Labs  Lab 04/23/19 0336 04/24/19 0307 04/25/19 0322 04/26/19 0331 04/27/19 0351  WBC 10.7* 8.4 7.0 9.1 9.1  HGB 8.8* 9.6* 9.1* 9.4* 8.6*  HCT 28.4* 30.7* 28.4* 29.8* 28.0*  MCV 91.3 90.3 89.3 89.5 90.6  PLT 191 204 192 256 141   Basic Metabolic Panel: Recent Labs  Lab 04/23/19 0336 04/24/19 0307 04/25/19 0322 04/26/19 0331 04/27/19 0351  NA 143 141 139 138 137  K 4.1 3.7 4.0 4.3 4.2  CL 116* 115* 111 110 109  CO2 20* 19* 20* 19* 20*  GLUCOSE 108* 106* 101* 98 104*  BUN 32* 31* 36* 31* 32*  CREATININE 1.33* 1.26* 1.50* 1.51* 1.49*  CALCIUM 9.5 9.3 9.1 9.3 9.3  MG  --   --  2.3  --  2.4   Liver Function Tests: No results for input(s): AST, ALT, ALKPHOS, BILITOT, PROT,  ALBUMIN in the last 168 hours. No results for input(s): LIPASE, AMYLASE in the last 168 hours. No results for input(s): AMMONIA in the last 168 hours. Cardiac Enzymes: No results for input(s): CKTOTAL, CKMB, CKMBINDEX, TROPONINI in the last 168 hours. BNP (last 3 results) Recent Labs    12/18/18 1021  BNP 386.5*    ProBNP (last 3 results) No results for input(s): PROBNP in the last 8760 hours.  CBG: No results for input(s): GLUCAP in the last 168 hours. Recent Results (from the past 240 hour(s))  SARS Coronavirus 2 (CEPHEID - Performed in [place] hospital lab), Hosp Order     Status: None   Collection Time: 04/19/19  9:33 PM  Result Value Ref Range Status   SARS Coronavirus 2 NEGATIVE NEGATIVE Final    Comment: (NOTE) If result is NEGATIVE SARS-CoV-2 target nucleic acids are NOT DETECTED. The SARS-CoV-2 RNA is generally detectable in upper and lower  respiratory specimens during the acute phase of infection. The lowest  concentration of SARS-CoV-2  viral copies this assay can detect is 250  copies / mL. A negative result does not preclude SARS-CoV-2 infection  and should not be used as the sole basis for treatment or other  patient management decisions.  A negative result may occur with  improper specimen collection / handling, submission of specimen other  than nasopharyngeal swab, presence of viral mutation(s) within the  areas targeted by this assay, and inadequate number of viral copies  (<250 copies / mL). A negative result must be combined with clinical  observations, patient history, and epidemiological information. If result is POSITIVE SARS-CoV-2 target nucleic acids are DETECTED. The SARS-CoV-2 RNA is generally detectable in upper and lower  respiratory specimens dur ing the acute phase of infection.  Positive  results are indicative of active infection with SARS-CoV-2.  Clinical  correlation with patient history and other diagnostic information is  necessary  to determine patient infection status.  Positive results do  not rule out bacterial infection or co-infection with other viruses. If result is PRESUMPTIVE POSTIVE SARS-CoV-2 nucleic acids MAY BE PRESENT.   A presumptive positive result was obtained on the submitted specimen  and confirmed on repeat testing.  While 2019 novel coronavirus  (SARS-CoV-2) nucleic acids may be present in the submitted sample  additional confirmatory testing may be necessary for epidemiological  and / or clinical management purposes  to differentiate between  SARS-CoV-2 and other Sarbecovirus currently known to infect humans.  If clinically indicated additional testing with an alternate test  methodology [phone]) is advised. The SARS-CoV-2 RNA is generally  detectable in upper and lower respiratory sp ecimens during the acute  phase of infection. The expected result is Negative. Fact Sheet for Patients:  StrictlyIdeas.no Fact Sheet for Healthcare Providers: BankingDealers.co.za This test is not yet approved or cleared by the [place] FDA and has been authorized for detection and/or diagnosis of SARS-CoV-2 by FDA under an Emergency Use Authorization (EUA).  This EUA will remain in effect (meaning this test can be used) for the duration of the COVID-19 declaration under Section 564(b)(1) of the Act, 21 U.S.C. section 360bbb-3(b)(1), unless the authorization is terminated or revoked sooner. Performed at [facility], [place] [address] [place], [place] [address]   MRSA PCR Screening     Status: None   Collection Time: 04/20/19  9:42 AM  Result Value Ref Range Status   MRSA by PCR NEGATIVE NEGATIVE Final    Comment:        The GeneXpert MRSA Assay (FDA approved for NASAL specimens only), is one component of a comprehensive MRSA colonization surveillance program. It is not intended to diagnose MRSA infection nor to guide or monitor treatment  for MRSA infections. Performed at [facility], [place] [address], [place], [place] [address]   Culture, Urine     Status: Abnormal   Collection Time: 04/25/19  9:55 PM  Result Value Ref Range Status   Specimen Description URINE, CATHETERIZED  Final   Special Requests   Final    NONE Performed at [facility], [place] [address] [place], [place] [address]    Culture >=100,000 COLONIES/mL ESCHERICHIA COLI (A)  Final   Report Status 04/28/2019 FINAL  Final   Organism ID, Bacteria ESCHERICHIA COLI (A)  Final      Susceptibility   Escherichia coli - MIC*    AMPICILLIN <=2 SENSITIVE Sensitive     CEFAZOLIN <=4 SENSITIVE Sensitive     CEFTRIAXONE <=1 SENSITIVE Sensitive  CIPROFLOXACIN <=0.25 SENSITIVE Sensitive     GENTAMICIN <=1 SENSITIVE Sensitive     IMIPENEM <=0.25 SENSITIVE Sensitive     NITROFURANTOIN <=16 SENSITIVE Sensitive     TRIMETH/SULFA <=20 SENSITIVE Sensitive     AMPICILLIN/SULBACTAM <=2 SENSITIVE Sensitive     PIP/TAZO <=4 SENSITIVE Sensitive     Extended ESBL NEGATIVE Sensitive     * >=100,000 COLONIES/mL ESCHERICHIA COLI  Culture, Urine     Status: Abnormal   Collection Time: 04/26/19  7:59 AM  Result Value Ref Range Status   Specimen Description URINE, CATHETERIZED  Final   Special Requests   Final    NONE Performed at [facility], [place] [address], [place], [place] [address]    Culture >=100,000 COLONIES/mL ESCHERICHIA COLI (A)  Final   Report Status 04/28/2019 FINAL  Final   Organism ID, Bacteria ESCHERICHIA COLI (A)  Final      Susceptibility   Escherichia coli - MIC*    AMPICILLIN <=2 SENSITIVE Sensitive     CEFAZOLIN <=4 SENSITIVE Sensitive     CEFTRIAXONE <=1 SENSITIVE Sensitive     CIPROFLOXACIN <=0.25 SENSITIVE Sensitive     GENTAMICIN <=1 SENSITIVE Sensitive     IMIPENEM <=0.25 SENSITIVE Sensitive     NITROFURANTOIN <=16 SENSITIVE Sensitive     TRIMETH/SULFA <=20 SENSITIVE Sensitive     AMPICILLIN/SULBACTAM <=2 SENSITIVE Sensitive      PIP/TAZO <=4 SENSITIVE Sensitive     Extended ESBL NEGATIVE Sensitive     * >=100,000 COLONIES/mL ESCHERICHIA COLI     Studies: Mr Virgel Paling FY Contrast  Result Date: 04/26/2019 CLINICAL DATA:  Stroke follow-up. Acute right frontal and posterior left temporal infarcts on MRI. EXAM: MRA HEAD WITHOUT CONTRAST TECHNIQUE: Angiographic images of the Circle of Willis were obtained using MRA technique without intravenous contrast. COMPARISON:  None. FINDINGS: The study is mildly motion degraded. The visualized distal vertebral arteries are widely patent to the basilar and codominant. Patent PICA and SCA origins are identified bilaterally. The basilar artery is widely patent. There is a large right posterior communicating artery with hypoplastic right P1 segment. There is a bulbous appearance at the origin of the right P1 segment from the basilar artery measuring 2 mm. Focal transverse signal loss at the right P1-P2 junction may be artifactual although an underlying stenosis is possible. The left PCA is patent without evidence of significant proximal stenosis. There is a lack of flow related enhancement in the included distal segment and majority of the intracranial portion of the right ICA. There is distal reconstitution of the ICA at the level of the posterior communicating artery. Of note, a normal right ICA flow void was present on the MRI earlier today. The intracranial left ICA is patent with mild irregularity but no evidence of significant stenosis. The ACAs and MCAs are patent proximally. There is mild narrowing of the distal right M1 segment. Branch vessel evaluation is limited by motion artifact, however there do appear to be missing right MCA superior division branch vessels corresponding to the infarct on MRI. The left MCA has an early bifurcation without significant M1 or proximal M2 stenosis. No significant proximal ACA stenosis is identified within limitations of motion. IMPRESSION: 1. Motion  degraded study. 2. Occlusion versus severely reduced flow in the right ICA with reconstitution via the posterior communicating artery. A normal flow void was present on the MRI earlier today. Head and neck CTA is recommended for further evaluation. 3. Suspected missing right MCA superior division branch vessels  corresponding to the area of infarct on MRI. 4. 2 mm aneurysm versus infundibulum or motion artifact at the right PCA origin. This can also be further evaluated with CTA. Electronically Signed   By: Logan Bores M.D.   On: 04/26/2019 18:04   Vas US Carotid  Result Date: 04/27/2019 Carotid Arterial Duplex Study Indications:       CVA, Speech disturbance, Syncope and Altered mental status. Risk Factors:      Hypertension. Limitations:       patient not responsive or arousable. Patient's head turned                    towards the right side and would not turn or keep head                    positioned. Comparison Study:  No prior study on file for comparison Performing Technologist: Sharion Dove RVS  Examination Guidelines: A complete evaluation includes B-mode imaging, spectral Doppler, color Doppler, and power Doppler as needed of all accessible portions of each vessel. Bilateral testing is considered an integral part of a complete examination. Limited examinations for reoccurring indications may be performed as noted.  Right Carotid Findings: +--------+--------+--------+--------+--------+--------+           PSV cm/s EDV cm/s Stenosis Describe Comments  +--------+--------+--------+--------+--------+--------+  CCA Prox 31       5                                    +--------+--------+--------+--------+--------+--------+ +----------+--------+-------+--------+-------------------+             PSV cm/s EDV cms Describe Arm Pressure (mmHG)  +----------+--------+-------+--------+-------------------+  Subclavian 141                                             +----------+--------+-------+--------+-------------------+ +---------+--------+--------+------------+  Vertebral PSV cm/s EDV cm/s Not assessed  +---------+--------+--------+------------+ Right side not assessed secondary to patient's mental status  Left Carotid Findings: +----------+--------+--------+--------+------------+------------------+             PSV cm/s EDV cm/s Stenosis Describe     Comments            +----------+--------+--------+--------+------------+------------------+  CCA Prox   107      22                             intimal thickening  +----------+--------+--------+--------+------------+------------------+  CCA Distal 113      23                             intimal thickening  +----------+--------+--------+--------+------------+------------------+  ICA Prox   94       21                heterogenous                     +----------+--------+--------+--------+------------+------------------+  ICA Distal 100      27                                                 +----------+--------+--------+--------+------------+------------------+  ECA        237      5                                                  +----------+--------+--------+--------+------------+------------------+ +----------+--------+--------+--------+-------------------+  Subclavian PSV cm/s EDV cm/s Describe Arm Pressure (mmHG)  +----------+--------+--------+--------+-------------------+             172                                             +----------+--------+--------+--------+-------------------+ +---------+--------+--+--------+--+  Vertebral PSV cm/s 86 EDV cm/s 19  +---------+--------+--+--------+--+  Summary:  Left Carotid: Velocities in the left ICA are consistent with a 1-39% stenosis. Vertebrals:  Left vertebral artery demonstrates antegrade flow. Subclavians: Normal flow hemodynamics were seen in bilateral subclavian              arteries. *See table(s) above for measurements and observations.     Preliminary    Vas [place]  Lower Extremity Venous (dvt)  Result Date: 04/28/2019  Lower Venous Study Indications: Stroke.  Limitations: Patient confusion and inability to follow commands. Comparison Study: No prior study on file for comparison Performing Technologist: Sharion Dove RVS  Examination Guidelines: A complete evaluation includes B-mode imaging, spectral Doppler, color Doppler, and power Doppler as needed of all accessible portions of each vessel. Bilateral testing is considered an integral part of a complete examination. Limited examinations for reoccurring indications may be performed as noted.  +---------+---------------+---------+-----------+----------+-------+  RIGHT     Compressibility Phasicity Spontaneity Properties Summary  +---------+---------------+---------+-----------+----------+-------+  CFV       Full            Yes       Yes                             +---------+---------------+---------+-----------+----------+-------+  SFJ       Full                                                      +---------+---------------+---------+-----------+----------+-------+  FV Prox   Full                                                      +---------+---------------+---------+-----------+----------+-------+  FV Mid    Full                                                      +---------+---------------+---------+-----------+----------+-------+  FV Distal Full                                                      +---------+---------------+---------+-----------+----------+-------+  PFV       Full                                                      +---------+---------------+---------+-----------+----------+-------+  POP                       Yes       Yes                             +---------+---------------+---------+-----------+----------+-------+  PTV       Full                                                      +---------+---------------+---------+-----------+----------+-------+  PERO      Full                                                       +---------+---------------+---------+-----------+----------+-------+   +---------+---------------+---------+-----------+----------+-------+  LEFT      Compressibility Phasicity Spontaneity Properties Summary  +---------+---------------+---------+-----------+----------+-------+  CFV       Full            Yes       Yes                             +---------+---------------+---------+-----------+----------+-------+  SFJ       Full                                                      +---------+---------------+---------+-----------+----------+-------+  FV Prox   Full                                                      +---------+---------------+---------+-----------+----------+-------+  FV Mid    Full                                                      +---------+---------------+---------+-----------+----------+-------+  FV Distal Full                                                      +---------+---------------+---------+-----------+----------+-------+  PFV       Full                                                      +---------+---------------+---------+-----------+----------+-------+  POP                       Yes       Yes                             +---------+---------------+---------+-----------+----------+-------+  PTV       Full                                                      +---------+---------------+---------+-----------+----------+-------+  PERO      Full                                                      +---------+---------------+---------+-----------+----------+-------+     Summary: Right: There is no evidence of deep vein thrombosis in the lower extremity. Left: There is no evidence of deep vein thrombosis in the lower extremity.  *See table(s) above for measurements and observations. Electronically signed by Deitra Mayo MD on 04/28/2019 at 6:42:29 AM.    Final     Scheduled Meds:  aspirin  300 mg Rectal Daily   Or   aspirin  325 mg Oral Daily    febuxostat  40 mg Oral Daily   fosfomycin  3 g Oral Once   lidocaine  1 patch Transdermal Q24H   metoprolol succinate  37.5 mg Oral Daily   polyethylene glycol  17 g Oral Daily    Continuous Infusions:  dextrose 50 mL/hr at 04/28/19 0400    Flora Lipps, MD  [facility] 04/28/2019

## 2019-04-28 NOTE — Evaluation (Signed)
Clinical/Bedside Swallow Evaluation Patient Details  Name: ZELENA BUSHONG MRN: [id number] Date of Birth: Sep 24, 1926  Today's Date: 04/28/2019 Time: SLP Start Time (ACUTE ONLY): 0932 SLP Stop Time (ACUTE ONLY): 0913 SLP Time Calculation (min) (ACUTE ONLY): 10 min  Past Medical History:  Past Medical History:  Diagnosis Date  . Arthritis    Gout  . Cataract   . Chronic kidney disease, stage IV (severe) ([place])   . Diverticulosis of colon (without mention of hemorrhage) 2008  . DVT (deep venous thrombosis) ([place]) 1950  . External hemorrhoids without mention of complication 6712  . Fibrocystic breast disease   . GERD (gastroesophageal reflux disease) 2004  . Glaucoma   . Gout   . Hiatal hernia 2004  . HTN (hypertension)   . Palpitations   . Peripheral vascular disease [facility])    Past Surgical History:  Past Surgical History:  Procedure Laterality Date  . APPENDECTOMY    . BREAST EXCISIONAL BIOPSY Bilateral 1975  . CESAREAN SECTION    . CYSTECTOMY     on colon  . EYE SURGERY Bilateral   . TONSILLECTOMY     HPI:  Patient is a 85 y/o female presenting to the ED on 04/19/19 s/p fall with left 5-9 rib fx. Initial head CT unremarkable, pt with increased lethargy on admission with MRI 5/23 demonstrating acute left temporal and Rt frontal infarcts. PMHx: HH, GERD, HTN, CKD stage IV, history of DVT on Coumadin, and gout.    Assessment / Plan / Recommendation Clinical Impression  Pt's level of alertness and awareness improved as evaluation progressed, initially not taking in any POs but ultimately participating in self-feeding of thin liquids. She has no overt signs of aspiration, but risk for aspiration, potentially without sensation, is present. She has reduced labial seal and anterior spillage with cup sips and moderately prolonged posterior transit with purees, although with minimal amounts of oral residue that could be visualized. For today, recommend allowing limited amounts of boluses  when fully alert to allow for additional opportunities to utilize her swallowing mechanism. Would limit this to meds crushed in puree and a few single sips of water after oral care. Will f/u for additional improvements in automaticity and alertness to determine readiness for MBS, if this is within her overall GOC. SLP Visit Diagnosis: Dysphagia, unspecified (R13.10)    Aspiration Risk  Mild aspiration risk;Moderate aspiration risk    Diet Recommendation NPO except meds;Free water protocol after oral care   Liquid Administration via: Straw Medication Administration: Crushed with puree    Other  Recommendations Oral Care Recommendations: Oral care QID;Oral care prior to ice chip/H20 Other Recommendations: Have oral suction available   Follow up Recommendations Skilled Nursing facility      Frequency and Duration min 2x/week  2 weeks       Prognosis Prognosis for Safe Diet Advancement: Good Barriers to Reach Goals: Language deficits      Swallow Study   General HPI: Patient is a 85 y/o female presenting to the ED on 04/19/19 s/p fall with left 5-9 rib fx. Initial head CT unremarkable, pt with increased lethargy on admission with MRI 5/23 demonstrating acute left temporal and Rt frontal infarcts. PMHx: HH, GERD, HTN, CKD stage IV, history of DVT on Coumadin, and gout.  Type of Study: Bedside Swallow Evaluation Previous Swallow Assessment: none in chart Diet Prior to this Study: NPO Temperature Spikes Noted: No Respiratory Status: Room air History of Recent Intubation: No Behavior/Cognition: Alert;Doesn't follow directions Oral Cavity Assessment: (  limited view - needs more assessment) Oral Care Completed by SLP: No Oral Cavity - Dentition: Adequate natural dentition;Other (Comment)(appears to have at least partial dentures?) Self-Feeding Abilities: Needs assist Patient Positioning: Upright in bed Baseline Vocal Quality: Normal Volitional Swallow: Unable to elicit     Oral/Motor/Sensory Function Overall Oral Motor/Sensory Function: (difficulty follow commands but ? facald roop)   Ice Chips Ice chips: Impaired Presentation: Spoon Oral Phase Impairments: Poor awareness of bolus;Other (comment)(does not open mouth for any ice chips)   Thin Liquid Thin Liquid: Impaired Presentation: Cup;Self Fed;Straw Oral Phase Impairments: Reduced labial seal Oral Phase Functional Implications: Right anterior spillage    Nectar Thick Nectar Thick Liquid: Not tested   Honey Thick Honey Thick Liquid: Not tested   Puree Puree: Impaired Presentation: Spoon Oral Phase Functional Implications: Prolonged oral transit   Solid     Solid: Not tested      Venita Sheffield Solene Hereford 04/28/2019,9:50 AM  Pollyann Glen, M.A. [place] Acute Environmental education officer [phone] Office [phone]

## 2019-04-28 NOTE — Evaluation (Signed)
Speech Language Pathology Evaluation Patient Details Name: MARUA QIN MRN: [id number] DOB: 22-Jun-1926 Today's Date: 04/28/2019 Time: 6759-1638 SLP Time Calculation (min) (ACUTE ONLY): 13 min  Problem List:  Patient Active Problem List   Diagnosis Date Noted  . Cerebral embolism with cerebral infarction 04/27/2019  . Goals of care, counseling/discussion   . Palliative care by specialist   . Multiple closed fractures of ribs of left side 04/19/2019  . Frequent falls 04/19/2019  . Syncope 04/19/2019  . History of DVT of lower extremity 11/13/2016  . Chronic anticoagulation 11/13/2016  . Statin intolerance 03/19/2015  . Cardiomegaly 03/19/2015  . Osteoporosis 12/24/2013  . Kyphosis 11/03/2013  . Acute venous embolism and thrombosis of deep vessels of distal lower extremity ([place]) 08/26/2013  . 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 Fibrocystic disease of right breast 05/22/2013  . DVT of leg (deep venous thrombosis) ([place]) 02/16/2013  . Leg edema, left 09/16/2012  . Gout   . CKD (chronic kidney disease), stage IV ([place]) 01/08/2012  . Multinodular thyroid 10/25/2011  . Fibrocystic breast disease, left.   . Open-angle glaucoma   . Hyperlipidemia 01/06/2009  . Essential hypertension 01/06/2009   Past Medical History:  Past Medical History:  Diagnosis Date  . Arthritis    Gout  . Cataract   . Chronic kidney disease, stage IV (severe) ([place])   . Diverticulosis of colon (without mention of hemorrhage) 2008  . DVT (deep venous thrombosis) ([place]) 1950  . External hemorrhoids without mention of complication 4665  . Fibrocystic breast disease   . GERD (gastroesophageal reflux disease) 2004  . Glaucoma   . Gout   . Hiatal hernia 2004  . HTN (hypertension)   . Palpitations   . Peripheral vascular disease [facility])    Past Surgical History:  Past Surgical History:  Procedure Laterality Date  . APPENDECTOMY    . BREAST EXCISIONAL BIOPSY Bilateral 1975  . CESAREAN  SECTION    . CYSTECTOMY     on colon  . EYE SURGERY Bilateral   . TONSILLECTOMY     HPI:  Patient is a 85 y/o female presenting to the ED on 04/19/19 s/p fall with left 5-9 rib fx. Initial head CT unremarkable, pt with increased lethargy on admission with MRI 5/23 demonstrating acute left temporal and Rt frontal infarcts. PMHx: HH, GERD, HTN, CKD stage IV, history of DVT on Coumadin, and gout.    Assessment / Plan / Recommendation Clinical Impression  Pt has an expressive and receptive aphasia with limited verbal output. Expressive language is further characterized by rote phrases and perseveration. During automatic speech tasks she briefly tapped along to a familiar melody, but otherwise showed no engagement. She often says "yea" or "no" regardless of questions or instructions provided. Max-Total A was needed for following one-step commands and accuracy with yes/no questions was <50%. She did participate in functional, familiar tasks when given cues for initiation. She would benefit from additional SLP f/u to maximize functional communication, with further differential diagnosis of cognitive abilities warranted.    SLP Assessment  SLP Recommendation/Assessment: Patient needs continued Speech Lanaguage Pathology Services SLP Visit Diagnosis: Aphasia (R47.01)    Follow Up Recommendations  Skilled Nursing facility    Frequency and Duration min 2x/week  2 weeks      SLP Evaluation Cognition  Overall Cognitive Status: Difficult to assess Arousal/Alertness: Lethargic(improved as evaluation continued) Orientation Level: Disoriented X4 Awareness: Impaired Awareness Impairment: Emergent impairment       Comprehension  Auditory Comprehension Overall Auditory Comprehension: Impaired Yes/No Questions: Impaired Basic Biographical Questions: 26-50% accurate Commands: Impaired One Step Basic Commands: 0-24% accurate    Expression Expression Primary Mode of Expression: Verbal Verbal  Expression Overall Verbal Expression: Impaired Level of Generative/Spontaneous Verbalization: Phrase Repetition: Impaired Level of Impairment: Word level   Oral / Motor  Oral Motor/Sensory Function Overall Oral Motor/Sensory Function: (difficulty following commands but ? facial droop) Motor Speech Overall Motor Speech: (limited output but intelligible)   GO                    Venita Sheffield Taiyo Kozma 04/28/2019, 10:04 AM  Pollyann Glen, M.A. [place] Acute Environmental education officer [phone] Office [phone]

## 2019-04-28 NOTE — Progress Notes (Signed)
Pts daughter-in-law Jocelyn Lamer updated on phone. Encouraged to call back with any questions or concerns. Will continue to monitor.  Rufina Falco, RN BSN 04/28/2019 8:44 AM

## 2019-04-28 NOTE — Care Management Important Message (Signed)
Important Message  Patient Details  Name: Elizabeth Burch MRN: [id number] Date of Birth: [birth year]   Medicare Important Message Given:  Yes    Murial Beam Montine Circle 04/28/2019, 3:25 PM

## 2019-04-28 NOTE — Progress Notes (Signed)
Occupational Therapy Treatment--Re-evaluation Patient Details Name: Elizabeth Burch MRN: [id number] DOB: 1926/02/21 Today's Date: 04/28/2019    History of present illness Patient is a 85 y/o female presenting to the ED on 04/19/19 s/p fall with left 5-9 rib fx. Initial head CT unremarkable, pt with increased lethargy on admission with MRI 5/23 demonstrating acute left temporal and Rt frontal infarcts. PMHx: HTN, CKD stage IV, history of DVT on Coumadin, and gout.    OT comments  This 85 yo female admitted with above presents to acute OT with decreased attention to left (can be cued to look left), decreased command following, decreased understandable communication, decreased mobility, use of Bil UEs (but not using them functionally for tasks asked of her. All of this affecting her safety and independence with basic ADLs. She will benefit from acute OT with continued OT at SNF.  Follow Up Recommendations  SNF;Supervision/Assistance - 24 hour    Equipment Recommendations  Hospital bed;Wheelchair (measurements OT);Wheelchair cushion (measurements OT);3 in 1 bedside commode       Precautions / Restrictions Precautions Precautions: Fall Precaution Comments: rib fx-L side, aphasia, increased tone LLE Restrictions Weight Bearing Restrictions: No       Mobility Bed Mobility               General bed mobility comments: Pt not following commands and any attempt at movement she was resisting me           ADL either performed or assessed with clinical judgement   ADL Overall ADL's : Needs assistance/impaired                                       General ADL Comments: total A for all basic ADLs. When put washcloth in pt's RUE and asked to wash face she kept it in her lap and did not try to use it. Then tried wash cloth in hand and hand over hand A with RUE but pt resisting.     Vision   Additional Comments: Pt with head turned partially to right when I entered,  she will turn head and look left when cued to do so, but her "go to" is looking right           Cognition Arousal/Alertness: Awake/alert Behavior During Therapy: Flat affect Overall Cognitive Status: Difficult to assess                                 General Comments: Pt fully awake and trying to get her mitted hands out from underneath the covers when I entered. I removed her mitts for her and then she started to pick at covers then pulling at catheter tubing with multiple verbal and tactile cues to not do this. Pt not following any commands at all. She will "talk" but not understandable. When asked questions she will "answer" but not with any sounds that make sense                   Pertinent Vitals/ Pain       Pain Assessment: Faces Faces Pain Scale: No hurt         Frequency  Min 2X/week        Progress Toward Goals  OT Goals(current goals can now be found in the care plan section)  Progress towards OT goals: Goals drowngraded-see  care plan;Progressing toward goals(new CVAs since initial eval)     Plan Discharge plan remains appropriate       AM-PAC OT "6 Clicks" Daily Activity     Outcome Measure   Help from another person eating meals?: Total Help from another person taking care of personal grooming?: Total Help from another person toileting, which includes using toliet, bedpan, or urinal?: Total Help from another person bathing (including washing, rinsing, drying)?: Total Help from another person to put on and taking off regular upper body clothing?: Total Help from another person to put on and taking off regular lower body clothing?: Total 6 Click Score: 6    End of Session    OT Visit Diagnosis: Other abnormalities of gait and mobility (R26.89);Other symptoms and signs involving cognitive function;Cognitive communication deficit (R41.841) Symptoms and signs involving cognitive functions: Cerebral infarction   Activity Tolerance Other  (comment)(limited due to decreased cognition)   Patient Left in bed;with call bell/phone within reach;with bed alarm set;with restraints reapplied(Bil mitts)   Nurse Communication (RN concurs that pt has either been fidgety or asleep and not following any commands for them)        Time: 5301-0404 OT Time Calculation (min): 13 min  Charges: OT General Charges $OT Visit: 1 Visit OT Evaluation $OT Re-eval: 1 Re-eval  Golden Circle, OTR/L Acute Rehab Services Pager [phone] Office [phone]      Almon Register 04/28/2019, 1:58 PM

## 2019-04-28 NOTE — Plan of Care (Signed)
  Problem: Clinical Measurements: Goal: Will remain free from infection Outcome: Progressing Goal: Respiratory complications will improve Outcome: Progressing   Problem: Health Behavior/Discharge Planning: Goal: Ability to manage health-related needs will improve Outcome: Not Progressing   Problem: Nutrition: Goal: Adequate nutrition will be maintained Outcome: Not Progressing   Problem: Coping: Goal: Level of anxiety will decrease Outcome: Not Progressing

## 2019-04-29 DIAGNOSIS — S2242XA Multiple fractures of ribs, left side, initial encounter for closed fracture: Secondary | ICD-10-CM

## 2019-04-29 DIAGNOSIS — Z515 Encounter for palliative care: Secondary | ICD-10-CM

## 2019-04-29 DIAGNOSIS — Z7189 Other specified counseling: Secondary | ICD-10-CM

## 2019-04-29 DIAGNOSIS — G9340 Encephalopathy, unspecified: Secondary | ICD-10-CM

## 2019-04-29 MED ORDER — INSULIN ASPART 100 UNIT/ML ~~LOC~~ SOLN: 0.0000 [IU] | SUBCUTANEOUS | Status: DC

## 2019-04-29 MED ORDER — POLYVINYL ALCOHOL 1.4 % OP SOLN
1.0000 [drp] | Freq: Four times a day (QID) | OPHTHALMIC | Status: DC | PRN
  Filled 2019-04-29: qty 15

## 2019-04-29 MED ORDER — HYDROMORPHONE HCL 1 MG/ML IJ SOLN
0.5000 mg | INTRAMUSCULAR | Status: DC | PRN
  Administered 2019-04-29: 0.5 mg via INTRAVENOUS
  Filled 2019-04-29: qty 1

## 2019-04-29 MED ORDER — BIOTENE DRY MOUTH MT LIQD: 15.0000 mL | OROMUCOSAL | Status: DC | PRN

## 2019-04-29 MED ORDER — GLYCOPYRROLATE 0.2 MG/ML IJ SOLN: 0.4000 mg | INTRAMUSCULAR | Status: DC | PRN

## 2019-04-29 MED ORDER — ACETAMINOPHEN 650 MG RE SUPP: 650.0000 mg | Freq: Four times a day (QID) | RECTAL | Status: DC | PRN

## 2019-04-29 NOTE — Progress Notes (Signed)
Daily Progress Note   Patient Name: Elizabeth Burch       Date: 04/29/2019 DOB: 29-Mar-1926  Age: 85 y.o. MRN#: [id number] Attending Physician: Flora Lipps, MD Primary Care Physician: Chipper Herb, MD Admit Date: 04/19/2019  Reason for Consultation/Follow-up: Establishing goals of care  Subjective: Minimally responsive. No purposeful movement.   Length of Stay: 9  Current Medications: Scheduled Meds:  . aspirin  300 mg Rectal Daily   Or  . aspirin  325 mg Oral Daily  . febuxostat  40 mg Oral Daily  . fosfomycin  3 g Oral Once  . lidocaine  1 patch Transdermal Q24H  . metoprolol succinate  37.5 mg Oral Daily  . polyethylene glycol  17 g Oral Daily    Continuous Infusions: . dextrose 50 mL/hr at 04/28/19 1702    PRN Meds: acetaminophen **OR** acetaminophen, hydrALAZINE, ondansetron **OR** ondansetron (ZOFRAN) IV, oxyCODONE-acetaminophen, senna-docusate  Physical Exam Vitals signs and nursing note reviewed.  Constitutional:      General: She is not in acute distress.    Appearance: She is ill-appearing.  Cardiovascular:     Rate and Rhythm: Rhythm irregularly irregular.  Pulmonary:     Effort: Pulmonary effort is normal. No tachypnea, accessory muscle usage or respiratory distress.  Abdominal:     Palpations: Abdomen is soft.  Neurological:     Comments: Mostly unresponsive. Did nod head once but no further response.              Vital Signs: BP (!) 154/64 (BP Location: Right Arm)   Pulse 67   Temp 97.9 F (36.6 C) (Axillary)   Resp (!) 21   Ht '5\' 2"'$  (1.575 m)   Wt 55.5 kg   SpO2 99%   BMI 22.38 kg/m  SpO2: SpO2: 99 % O2 Device: O2 Device: Room Air O2 Flow Rate: O2 Flow Rate (L/min): 2 L/min  Intake/output summary:   Intake/Output Summary (Last 24  hours) at 04/29/2019 1053 Last data filed at 04/29/2019 0915 Gross per 24 hour  Intake 1238 ml  Output 1100 ml  Net 138 ml   LBM: Last BM Date: 04/28/19 Baseline Weight: Weight: 68 kg Most recent weight: Weight: 55.5 kg       Palliative Assessment/Data:    Flowsheet Rows     Most Recent Value  Intake Tab  Referral Department  Hospitalist  Unit at Time of Referral  Cardiac/Telemetry Unit  Palliative Care Primary Diagnosis  Other (Comment) [fall]  Date Notified  04/23/19  Palliative Care Type  New Palliative care  Reason for referral  Clarify Goals of Care  Date of Admission  04/20/19  Date first seen by Palliative Care  04/24/19  # of days Palliative referral response time  1 Day(s)  # of days IP prior to Palliative referral  3  Clinical Assessment  Palliative Performance Scale Score  70%  Psychosocial & Spiritual Assessment  Palliative Care Outcomes  Patient/Family meeting held?  Yes  Who was at the meeting?  son  Palliative Care Outcomes  Clarified goals of care, Provided advance care planning, Provided psychosocial or spiritual support      Patient Active Problem List   Diagnosis Date Noted  . Cerebral embolism with cerebral infarction 04/27/2019  . Goals of care, counseling/discussion   . Palliative care by specialist   . Multiple closed fractures of ribs of left side 04/19/2019  . Frequent falls 04/19/2019  . Syncope 04/19/2019  . History of DVT of lower extremity 11/13/2016  . Chronic anticoagulation 11/13/2016  . Statin intolerance 03/19/2015  . Cardiomegaly 03/19/2015  . Osteoporosis 12/24/2013  . Kyphosis 11/03/2013  . Acute venous embolism and thrombosis of deep vessels of distal lower extremity ([place]) 08/26/2013  . 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 Fibrocystic disease of right breast 05/22/2013  . DVT of leg (deep venous thrombosis) ([place]) 02/16/2013  . Leg edema, left 09/16/2012  . Gout   . CKD (chronic kidney disease), stage IV  ([place]) 01/08/2012  . Multinodular thyroid 10/25/2011  . Fibrocystic breast disease, left.   . Open-angle glaucoma   . Hyperlipidemia 01/06/2009  . Essential hypertension 01/06/2009    Palliative Care Assessment & Plan   HPI: 85 yo female with PMH stage 4 CKD, h/o DVT on Coumadin, gout, HTN admitted from home 04/19/2019 after a fall with multiple rib fractures  Assessment: I met today at Elizabeth Burch bedside. She is minimally responsive and would only nod her head once but no further responses. She does not open eyes or make attempts to verbalize. No distress. Resting comfortably in bed. Reviewed chart and discussed with bedside RN.    I called and spoke with Elizabeth Burch son, Elizabeth Burch. Elizabeth Burch is her only child and her husband died ~2 years ago. Elizabeth Burch has good understanding of his mother's poor prognosis and tells me that she was only awake on Thursday and has continued to decline since. He was very clear that she would desire DNR and would not want her life prolonged artificially. She was very proud and would not want to live in a declined QOL. Comfort is our priority for her care. I discussed visitation with Elizabeth Burch but he would rather remember his mother being independent and vibrant and not lying in a bed unresponsive. We discussed that the only possible discharge plan that I foresee would be for hospice facility but we will continue to discuss. He only mentions this because he has a friend who works at [employer] and if she were to need discharge he would feel comfortable with placement there. He understands that she is not a candidate for rehab. He is accepting that his mother is at EOL.   Recommendations/Plan:  Comfort orders and PRN medications placed.   Goals of Care and Additional Recommendations:  Limitations on Scope of Treatment: Full Comfort  Care  Code Status:  DNR  Prognosis:   Hours - Days  Discharge Planning:  Hospital death vs hospice facility.   Care plan was discussed  with Dr. Louanne Belton and Hoyle Sauer.   Thank you for allowing the Palliative Medicine Team to assist in the care of this patient.   Total Time 40 min Prolonged Time Billed  no       Greater than 50%  of this time was spent counseling and coordinating care related to the above assessment and plan.  Vinie Sill, NP Palliative Medicine Team Pager # [phone] (M-F 8a-5p) Team Phone # [phone] (Nights/Weekends)

## 2019-04-29 NOTE — Progress Notes (Signed)
Notified MD of CCMD's report of cardiac pauses and bradycardia. Also notified of decreased LOC. MD did assessed pt. New orders noted. Updated son of change in condition. Updated palliative in pt condition. Pt resting quietly with eyes closed. Turned and repositioned with hells floated and bony prominences padded. No signs of distress noted. Fall mats at bedside and bed in lowest position with alarms on. Will continue to monitor.

## 2019-04-29 NOTE — Progress Notes (Signed)
  Speech Language Pathology Treatment: Dysphagia;Cognitive-Linquistic  Patient Details Name: Elizabeth Burch MRN: [id number] DOB: Aug 13, 1980 Today's Date: 04/29/2019 Time: 0811-0823 SLP Time Calculation (min) (ACUTE ONLY): 12 min  Assessment / Plan / Recommendation Clinical Impression  Pt is more lethargic this morning than she was during initial SLP evaluation on previous date. Max multimodal cues were provided to maximize arousal. She did not open her eyes or follow commands throughout session. She took over washing her face with a washcloth for only 1-2 seconds after SLP had initiated functional task. Verbal expression was limited to "I think so" x2 within inappropriate context. Attempted to engage pt in self-feeding task again, but she was not alert enough to safely attempt POs today. Could offer meds crushed in puree or sips of water after oral care when fully alert, although intake even at this level may be significantly limited.   HPI HPI: Patient is a 85 y/o female presenting to the ED on 04/19/19 s/p fall with left 5-9 rib fx. Initial head CT unremarkable, pt with increased lethargy on admission with MRI 5/23 demonstrating acute left temporal and Rt frontal infarcts. PMHx: HH, GERD, HTN, CKD stage IV, history of DVT on Coumadin, and gout.       SLP Plan  Continue with current plan of care       Recommendations  Diet recommendations: NPO(few sips of water if fully alert) Medication Administration: Crushed with puree(if fully alert)                Oral Care Recommendations: Oral care QID Follow up Recommendations: Skilled Nursing facility SLP Visit Diagnosis: Dysphagia, unspecified (R13.10);Aphasia (R47.01) Plan: Continue with current plan of care       [place] Ilayda Toda 04/29/2019, 8:27 AM  Pollyann Glen, M.A. [place] Acute Environmental education officer [phone] Office [phone]

## 2019-04-29 NOTE — Progress Notes (Signed)
Last 12 hours, patient has been lethargic, responds to voice but only to mumble few words. Patient is not eating, refuses to take meds per day shift. Patient is not alert but will open her right eye briefly if you mention her name.   Please reconsider palliative to talk to family for comfort care

## 2019-04-29 NOTE — Plan of Care (Signed)
  Problem: Education: Goal: Knowledge of General Education information will improve Description: Including pain rating scale, medication(s)/side effects and non-pharmacologic comfort measures Outcome: Progressing   Problem: Clinical Measurements: Goal: Ability to maintain clinical measurements within normal limits will improve Outcome: Not Progressing   

## 2019-04-29 NOTE — Progress Notes (Signed)
PROGRESS NOTE  Elizabeth Burch:[id number] DOB: [birth year] DOA: 04/19/2019 PCP: Chipper Herb, MD   LOS: 9 days   Brief narrative:  This is a 85 year old female with history of stage IV kidney disease, history of DVT on Coumadin, gout, hypertension who lives independently at home was admitted following a fall, patient couldnot recall the events preceding the fall, syncope suspected. In the emergency room, labs noted with creatinine of 1.8, SARS COVID-19 PCR was negative. Chest x-ray noted multiple minimally displaced fractures of left ribs 5 through 9. CT abdomen pelvis did not show any other abnormality, x-rays of pelvis were unremarkable.  Subjective:   Patient is more lethargic today with decreased responsiveness.  Limited verbalization on physical stimulus  Assessment/Plan:  Principal Problem:   Syncope Active Problems:   Essential hypertension   CKD (chronic kidney disease), stage IV (HCC)   Gout   History of DVT of lower extremity   Chronic anticoagulation   Multiple closed fractures of ribs of left side   Frequent falls   Goals of care, counseling/discussion   Palliative care by specialist   Cerebral embolism with cerebral infarction   Encephalopathy  Acute metabolic encephalopathy likely secondary to multifocal CVA.  CT scan showed hypodensities.  MRI of the brain was performed which shows acute infarct of the right frontal lobe and left posterior temporal lobe.     Neurology saw the patient.  Patient does have history of atrial fibrillation and has stroke in multi-vascular territory suggestive of embolic phenomena.  Aspirin was initiated.  Patient is at high risk of bleeding intracranially if anticoagulation is started.  Neurology recommended starting anticoagulation if the patient would improve after 5 days of a stroke.   Multiple left rib fractures 5- 9 status post fall and trauma. Minimally displaced.    Patient was continued on conservative treatment    Urinary retention.  Continue foley cath.    Now continue for comfort    E Coli UTI. Allergy to cephalosporins and Cipro.  Will give 1 dose of fosfomycin if able but has impaired p.o. ability.  Mild cognitive decline, failure to thrive on presentation.  Now with significant trauma and multiple CVA, prognosis seems to be guarded.  Fall/frequent falls -Possible syncope. No events on telemetry, echocardiogram with preserved EF, no significant findings. Continue fall precautions.     History of DVT, atrial fibrillation on chronic Coumadin Not on anticoagulation in the setting of stroke with large area.  Stage IV chronic kidney disease Received D5 water during hospitalization.  Hypertension Received Toprol.   Gout -Stable.    VTE Prophylaxis: discontinue for comfort  Code Status: DNR  Family Communication:  Palliative care team on board and had had a prolonged discussion with the patient's family today.  Disposition Plan:  Hospice discussion has been initiated.  Palliative care to follow.  Comfort measures have been initiated  Consultants:   Trauma surgery   Palliative care   Neurology  Procedures:  None  Antibiotics: Anti-infectives (From admission, onward)   None      Objective: Vitals:   04/29/19 0601 04/29/19 0915  BP: (!) 150/37 (!) 154/64  Pulse: 67   Resp: (!) 23 (!) 21  Temp: 97.8 F (36.6 C) 97.9 F (36.6 C)  SpO2: 93% 99%    Intake/Output Summary (Last 24 hours) at 04/29/2019 1535 Last data filed at 04/29/2019 1504 Gross per 24 hour  Intake 1238 ml  Output 1600 ml  Net -362 ml   [employer]  04/25/19 0524 04/26/19 0514 04/29/19 0601  Weight: 66.8 kg 68.8 kg 55.5 kg   Body mass index is 22.38 kg/m.   Physical Exam: GENERAL:, Patient is minimally responsive today.  Incomprehensible speech  HENT: No scleral pallor or icterus. Pupils equally reactive to light. Oral mucosa is dry NECK: is supple, no palpable thyroid enlargement.  CHEST:  Diminished breath sounds bilaterally.  CVS: S1 and S2 heard, no murmur.  Irregular rhythm.  No pericardial rub. ABDOMEN: Soft, non-tender, bowel sounds are present. No palpable hepato-splenomegaly.  Foley catheter in place EXTREMITIES.  No edema.   CNS: Incomprehensible speech.  Minimally responsive. Left side seems to be more weak than the right but difficult to fully evaluate. SKIN: warm and dry   Data Review: I have personally reviewed the following laboratory data and studies,  CBC: Recent Labs  Lab 04/23/19 0336 04/24/19 0307 04/25/19 0322 04/26/19 0331 04/27/19 0351  WBC 10.7* 8.4 7.0 9.1 9.1  HGB 8.8* 9.6* 9.1* 9.4* 8.6*  HCT 28.4* 30.7* 28.4* 29.8* 28.0*  MCV 91.3 90.3 89.3 89.5 90.6  PLT 191 204 192 256 812   Basic Metabolic Panel: Recent Labs  Lab 04/23/19 0336 04/24/19 0307 04/25/19 0322 04/26/19 0331 04/27/19 0351  NA 143 141 139 138 137  K 4.1 3.7 4.0 4.3 4.2  CL 116* 115* 111 110 109  CO2 20* 19* 20* 19* 20*  GLUCOSE 108* 106* 101* 98 104*  BUN 32* 31* 36* 31* 32*  CREATININE 1.33* 1.26* 1.50* 1.51* 1.49*  CALCIUM 9.5 9.3 9.1 9.3 9.3  MG  --   --  2.3  --  2.4   Liver Function Tests: No results for input(s): AST, ALT, ALKPHOS, BILITOT, PROT, ALBUMIN in the last 168 hours. No results for input(s): LIPASE, AMYLASE in the last 168 hours. No results for input(s): AMMONIA in the last 168 hours. Cardiac Enzymes: No results for input(s): CKTOTAL, CKMB, CKMBINDEX, TROPONINI in the last 168 hours. BNP (last 3 results) Recent Labs    12/18/18 1021  BNP 386.5*    ProBNP (last 3 results) No results for input(s): PROBNP in the last 8760 hours.  CBG: No results for input(s): GLUCAP in the last 168 hours. Recent Results (from the past 240 hour(s))  SARS Coronavirus 2 (CEPHEID - Performed in [place] hospital lab), Hosp Order     Status: None   Collection Time: 04/19/19  9:33 PM  Result Value Ref Range Status   SARS Coronavirus 2 NEGATIVE  NEGATIVE Final    Comment: (NOTE) If result is NEGATIVE SARS-CoV-2 target nucleic acids are NOT DETECTED. The SARS-CoV-2 RNA is generally detectable in upper and lower  respiratory specimens during the acute phase of infection. The lowest  concentration of SARS-CoV-2 viral copies this assay can detect is 250  copies / mL. A negative result does not preclude SARS-CoV-2 infection  and should not be used as the sole basis for treatment or other  patient management decisions.  A negative result may occur with  improper specimen collection / handling, submission of specimen other  than nasopharyngeal swab, presence of viral mutation(s) within the  areas targeted by this assay, and inadequate number of viral copies  (<250 copies / mL). A negative result must be combined with clinical  observations, patient history, and epidemiological information. If result is POSITIVE SARS-CoV-2 target nucleic acids are DETECTED. The SARS-CoV-2 RNA is generally detectable in upper and lower  respiratory specimens dur ing the acute phase of infection.  Positive  results  are indicative of active infection with SARS-CoV-2.  Clinical  correlation with patient history and other diagnostic information is  necessary to determine patient infection status.  Positive results do  not rule out bacterial infection or co-infection with other viruses. If result is PRESUMPTIVE POSTIVE SARS-CoV-2 nucleic acids MAY BE PRESENT.   A presumptive positive result was obtained on the submitted specimen  and confirmed on repeat testing.  While 2019 novel coronavirus  (SARS-CoV-2) nucleic acids may be present in the submitted sample  additional confirmatory testing may be necessary for epidemiological  and / or clinical management purposes  to differentiate between  SARS-CoV-2 and other Sarbecovirus currently known to infect humans.  If clinically indicated additional testing with an alternate test  methodology [id number]) is  advised. The SARS-CoV-2 RNA is generally  detectable in upper and lower respiratory sp ecimens during the acute  phase of infection. The expected result is Negative. Fact Sheet for Patients:  StrictlyIdeas.no Fact Sheet for Healthcare Providers: BankingDealers.co.za This test is not yet approved or cleared by the [place] FDA and has been authorized for detection and/or diagnosis of SARS-CoV-2 by FDA under an Emergency Use Authorization (EUA).  This EUA will remain in effect (meaning this test can be used) for the duration of the COVID-19 declaration under Section 564(b)(1) of the Act, 21 U.S.C. section 360bbb-3(b)(1), unless the authorization is terminated or revoked sooner. Performed at [facility], [place] [address], [place], [place] [address]   MRSA PCR Screening     Status: None   Collection Time: 04/20/19  9:42 AM  Result Value Ref Range Status   MRSA by PCR NEGATIVE NEGATIVE Final    Comment:        The GeneXpert MRSA Assay (FDA approved for NASAL specimens only), is one component of a comprehensive MRSA colonization surveillance program. It is not intended to diagnose MRSA infection nor to guide or monitor treatment for MRSA infections. Performed at [facility], [place] [address], [place], [place] [address]   Culture, Urine     Status: Abnormal   Collection Time: 04/25/19  9:55 PM  Result Value Ref Range Status   Specimen Description URINE, CATHETERIZED  Final   Special Requests   Final    NONE Performed at [facility], [place] [address], [place], [place] [address]    Culture >=100,000 COLONIES/mL ESCHERICHIA COLI (A)  Final   Report Status 04/28/2019 FINAL  Final   Organism ID, Bacteria ESCHERICHIA COLI (A)  Final      Susceptibility   Escherichia coli - MIC*    AMPICILLIN <=2 SENSITIVE Sensitive     CEFAZOLIN <=4 SENSITIVE Sensitive     CEFTRIAXONE <=1 SENSITIVE Sensitive     CIPROFLOXACIN  <=0.25 SENSITIVE Sensitive     GENTAMICIN <=1 SENSITIVE Sensitive     IMIPENEM <=0.25 SENSITIVE Sensitive     NITROFURANTOIN <=16 SENSITIVE Sensitive     TRIMETH/SULFA <=20 SENSITIVE Sensitive     AMPICILLIN/SULBACTAM <=2 SENSITIVE Sensitive     PIP/TAZO <=4 SENSITIVE Sensitive     Extended ESBL NEGATIVE Sensitive     * >=100,000 COLONIES/mL ESCHERICHIA COLI  Culture, Urine     Status: Abnormal   Collection Time: 04/26/19  7:59 AM  Result Value Ref Range Status   Specimen Description URINE, CATHETERIZED  Final   Special Requests   Final    NONE Performed at [facility], [place] [address] [place], [place] [address]    Culture >=100,000  COLONIES/mL ESCHERICHIA COLI (A)  Final   Report Status 04/28/2019 FINAL  Final   Organism ID, Bacteria ESCHERICHIA COLI (A)  Final      Susceptibility   Escherichia coli - MIC*    AMPICILLIN <=2 SENSITIVE Sensitive     CEFAZOLIN <=4 SENSITIVE Sensitive     CEFTRIAXONE <=1 SENSITIVE Sensitive     CIPROFLOXACIN <=0.25 SENSITIVE Sensitive     GENTAMICIN <=1 SENSITIVE Sensitive     IMIPENEM <=0.25 SENSITIVE Sensitive     NITROFURANTOIN <=16 SENSITIVE Sensitive     TRIMETH/SULFA <=20 SENSITIVE Sensitive     AMPICILLIN/SULBACTAM <=2 SENSITIVE Sensitive     PIP/TAZO <=4 SENSITIVE Sensitive     Extended ESBL NEGATIVE Sensitive     * >=100,000 COLONIES/mL ESCHERICHIA COLI     Studies: No results found.  Scheduled Meds: . lidocaine  1 patch Transdermal Q24H    Continuous Infusions: . dextrose 50 mL/hr at 04/28/19 [place], MD  [facility] 04/29/2019

## 2019-04-30 DIAGNOSIS — I6310 Cerebral infarction due to embolism of unspecified precerebral artery: Secondary | ICD-10-CM

## 2019-04-30 DIAGNOSIS — S2242XK Multiple fractures of ribs, left side, subsequent encounter for fracture with nonunion: Secondary | ICD-10-CM

## 2019-04-30 LAB — SARS CORONAVIRUS 2 BY RT PCR (HOSPITAL ORDER, PERFORMED IN ~~LOC~~ HOSPITAL LAB): SARS Coronavirus 2: NEGATIVE

## 2019-04-30 NOTE — Progress Notes (Signed)
  Speech Language Pathology Treatment: Dysphagia  Patient Details Name: Elizabeth Burch MRN: [id number] DOB: 05-11-1926 Today's Date: 04/30/2019 Time: 5374-8270 SLP Time Calculation (min) (ACUTE ONLY): 25 min  Assessment / Plan / Recommendation Clinical Impression  Patient alert but continues to be very confused and only responds with "yeah", "that's good", etc. She consumed approximately 6 ounces of plain water via straw sips that she was actually able to perform herself after SLP setup of cup in her hand. Patient also consumed bites of Jello and exhibited mild increased mastication and oral delays but no oral residuals post swallows. She did exhibit a short duration of delayed coughing at end of PO intake, but voice remained clear. Plan to continue trials of PO's and to continue to allow patient to have sips of plain water after oral care and if alert, and for RN to give medications crushed in puree.     HPI HPI: Patient is a 85 y/o female presenting to the ED on 04/19/19 s/p fall with left 5-9 rib fx. Initial head CT unremarkable, pt with increased lethargy on admission with MRI 5/23 demonstrating acute left temporal and Rt frontal infarcts. PMHx: HH, GERD, HTN, CKD stage IV, history of DVT on Coumadin, and gout.       SLP Plan  Continue with current plan of care       Recommendations  Diet recommendations: NPO(may have water after oral care if alert) Medication Administration: Crushed with puree Supervision: Full supervision/cueing for compensatory strategies;Staff to assist with self feeding Compensations: Minimize environmental distractions;Slow rate;Small sips/bites Postural Changes and/or Swallow Maneuvers: Seated upright 90 degrees                Oral Care Recommendations: Oral care QID Follow up Recommendations: Skilled Nursing facility SLP Visit Diagnosis: Dysphagia, unspecified (R13.10) Plan: Continue with current plan of care       GO                 Dannial Monarch 04/30/2019, 2:52 PM    Sonia Baller, MA, [facility] Pager: [phone]

## 2019-04-30 NOTE — Discharge Summary (Signed)
Physician Discharge Summary  Elizabeth Burch:[id number] DOB: July 04, 1926 DOA: 04/19/2019  PCP: Chipper Herb, MD  Admit date: 04/19/2019 Discharge date: 04/30/2019  Admitted From: Home Disposition:  Hospice - [place]  Recommendations for Outpatient Follow-up:  1. Follow up with PCP in 1-2 weeks 2. Please obtain BMP/CBC in one week your next doctors visit.  3. Discontinue home meds   Discharge Condition: Stable CODE STATUS: DNR Diet recommendation: Comfort Care   Brief/Interim Summary:  85 year old with history of stage IV CKD, history of DVT on Coumadin, gout, hypertension who lives independently presented after sustaining a fall and could not recall events prior to this.  Upon admission COVID test was negative, chest x-ray showed multiple nondisplaced left-sided rib fracture 5-9.  CT of the abdomen pelvis did not show any acute abnormality.  X-ray of the pelvis was negative.  Her Coumadin was held, MRI head in the hospital showed multifocal CVA in the left-sided frontal and posterior temporal lobes.  Aspirin was initiated.  Due to urinary retention, Foley was placed.  Was treated for UTI/E. coli with 1 dose of fosfomycin.  Overall due to her poor quality of life and failure to thrive, palliative team was consulted and it was determined to make patient comfort care.  Discharge Diagnoses:  Principal Problem:   Syncope Active Problems:   Essential hypertension   CKD (chronic kidney disease), stage IV (HCC)   Gout   History of DVT of lower extremity   Chronic anticoagulation   Multiple closed fractures of ribs of left side   Frequent falls   Goals of care, counseling/discussion   Palliative care by specialist   Cerebral embolism with cerebral infarction   Encephalopathy  Acute encephalopathy secondary to multifocal CVA on the right frontal and left posterior temporal lobe - MRI of the brain is positive for multifocal infarct.  Currently on aspirin.  Neurology  following. Risk for intracranial bleeding therefore no anticoagulation for now.  Mechanical fall causing multiple left-sided rib fracture 5-9 -Minimally displaced.  Conservative management.  Urinary tract infection with E. coli -Due to multiple allergies, 1 dose of fosfomycin given by previous provider.  Urinary retention, resolved -Foley catheter placed.  Now in place for comfort care as well.  Failure to thrive in adult - Overall poor prognosis.   Atrial fibrillation, chronic History of DVT -On Coumadin which is on hold due to large area of stroke with concern for intra-cranial hemorrhagic conversion  Essential hypertension -Resume home meds  DVT prophylaxis: Discontinued as patient is on comfort care Code Status: DNR Family Communication: Palliative care team has been in discussion with the patient's family, transition to comfort care. I spoke with her son prior to her discharge as well.  Disposition Plan: Transition patient to hospice home   Consultations:  Palliative care  Trauma service  Neurology  Subjective: No complaints.   Discharge Exam: Vitals:   04/29/19 0915 04/29/19 1936  BP: (!) 154/64 (!) 129/42  Pulse:  61  Resp: (!) 21 16  Temp: 97.9 F (36.6 C) 97.9 F (36.6 C)  SpO2: 99% 92%   Vitals:   04/28/19 2119 04/29/19 0601 04/29/19 0915 04/29/19 1936  BP: (!) 139/58 (!) 150/37 (!) 154/64 (!) 129/42  Pulse: 81 67  61  Resp: (!) 23 (!) 23 (!) 21 16  Temp: 97.9 F (36.6 C) 97.8 F (36.6 C) 97.9 F (36.6 C) 97.9 F (36.6 C)  TempSrc: Axillary Oral Axillary Oral  SpO2: 94% 93% 99% 92%  Weight:  55.5 kg    Height:        General: Pt is alert, awake, not in acute distress Cardiovascular: RRR, S1/S2 +, no rubs, no gallops Respiratory: CTA bilaterally, no wheezing, no rhonchi Abdominal: Soft, NT, ND, bowel sounds + Extremities: no edema, no cyanosis  Discharge Instructions   Allergies as of 04/30/2019      Reactions   Prednisone  Hypertension   Vigamox [moxifloxacin] Nausea And Vomiting   Allopurinol    Keflex [cephalexin]    Rash    Ace Inhibitors Other (See Comments)   unknown   Alendronate Sodium Rash   Ciprofloxacin Rash   Clarithromycin Rash   Clindamycin/lincomycin Rash   Nitrofurantoin Rash   Ofloxacin Rash   Penicillins Rash   Did it involve swelling of the face/tongue/throat, SOB, or low BP? Unknown Did it involve sudden or severe rash/hives, skin peeling, or any reaction on the inside of your mouth or nose? Unknown Did you need to seek medical attention at a hospital or doctor's office? Unknown When did it last happen? unknown If all above answers are "NO", may proceed with cephalosporin use.   Sulfamethoxazole Rash   Sulfonamide Derivatives Rash      Medication List    STOP taking these medications   amLODipine 10 MG tablet Commonly known as:  NORVASC   brimonidine 0.2 % ophthalmic solution Commonly known as:  ALPHAGAN   dorzolamide-timolol 22.3-6.8 MG/ML ophthalmic solution Commonly known as:  COSOPT   febuxostat 40 MG tablet Commonly known as:  ULORIC   Fish Oil 1000 MG Caps   furosemide 40 MG tablet Commonly known as:  LASIX   losartan 100 MG tablet Commonly known as:  COZAAR   metoprolol succinate 25 MG 24 hr tablet Commonly known as:  TOPROL-XL   PRESERVISION AREDS 2 PO   Travoprost (BAK Free) 0.004 % Soln ophthalmic solution Commonly known as:  TRAVATAN   warfarin 2 MG tablet Commonly known as:  COUMADIN       Allergies  Allergen Reactions  . Prednisone Hypertension  . Vigamox [Moxifloxacin] Nausea And Vomiting  . Allopurinol   . Keflex [Cephalexin]     Rash   . Ace Inhibitors Other (See Comments)    unknown  . Alendronate Sodium Rash  . Ciprofloxacin Rash  . Clarithromycin Rash  . Clindamycin/Lincomycin Rash  . Nitrofurantoin Rash  . Ofloxacin Rash  . Penicillins Rash    Did it involve swelling of the face/tongue/throat, SOB, or low BP? Unknown Did  it involve sudden or severe rash/hives, skin peeling, or any reaction on the inside of your mouth or nose? Unknown Did you need to seek medical attention at a hospital or doctor's office? Unknown When did it last happen? unknown If all above answers are "NO", may proceed with cephalosporin use.     . Sulfamethoxazole Rash  . Sulfonamide Derivatives Rash    You were cared for by a hospitalist during your hospital stay. If you have any questions about your discharge medications or the care you received while you were in the hospital after you are discharged, you can call the unit and asked to speak with the hospitalist on call if the hospitalist that took care of you is not available. Once you are discharged, your primary care physician will handle any further medical issues. Please note that no refills for any discharge medications will be authorized once you are discharged, as it is imperative that you return to your primary care physician (or establish  a relationship with a primary care physician if you do not have one) for your aftercare needs so that they can reassess your need for medications and monitor your lab values.   Procedures/Studies: Ct Abdomen Wo Contrast  Result Date: 04/19/2019 CLINICAL DATA:  85 year old post fall this morning. Rib fractures on radiograph. EXAM: CT CHEST, ABDOMEN AND PELVIS WITHOUT CONTRAST TECHNIQUE: Multidetector CT imaging of the chest, abdomen and pelvis was performed following the standard protocol without IV contrast. COMPARISON:  Chest radiograph earlier this day. FINDINGS: CT CHEST FINDINGS Cardiovascular: Aortic atherosclerosis. No periaortic stranding to suggest injury. Mild cardiomegaly. There are coronary artery calcifications. No pericardial effusion. Mediastinum/Nodes: Mediastinal hemorrhage or hematoma. No pneumomediastinum. The esophagus is decompressed. Slightly heterogeneous thyroid gland without dominant nodule. Evidence of mediastinal adenopathy,  lack of IV contrast limits detailed assessment. Lungs/Pleura: No pneumothorax. Evidence pulmonary contusion. Trace left pleural thickening/effusion and atelectasis the lower lobe. No right pleural fluid. Linear atelectasis or scarring in the anterior right upper lobe and right middle lobe. Calcified granuloma in the right middle lobe. No pulmonary edema. Musculoskeletal: Acute anterolateral minimally displaced left fifth through ninth rib fractures. Remote sternal fracture. Remote T6 compression fracture. Included shoulder girdles and clavicles are intact. No confluent chest Terrio contusion. CT ABDOMEN PELVIS FINDINGS Hepatobiliary: Lack of IV contrast limits assessment for injury. Allowing for this, no evidence of hepatic injury. 11 mm simple cyst in the left lobe of the liver. No perihepatic hematoma. Gallbladder is unremarkable. Pancreas: No evidence of injury. No ductal dilatation or inflammation. Spleen: Lack of IV contrast limits detailed assessment. Allowing for this, no evidence of injury. No perisplenic hematoma. Adrenals/Urinary Tract: No adrenal hemorrhage or renal injury identified. Bilateral renal parenchymal thinning. Cortical scarring in the upper right kidney. Bladder is unremarkable. Stomach/Bowel: Small hiatal hernia. Stomach nondistended. No bowel Keesling thickening or inflammatory change. No evidence of bowel injury. No mesenteric hematoma. Sutures at the base of the cecum from presumed appendectomy. Distal colonic diverticulosis without diverticulitis. Vascular/Lymphatic: Aortic atherosclerosis without aneurysm. No retroperitoneal fluid or stranding. No adenopathy. Reproductive: Status post hysterectomy. No adnexal masses. Other: No free fluid or free air. No confluent body Maston contusion. Musculoskeletal: No fracture of the pelvis or lumbar spine. IMPRESSION: 1. Acute anterolateral minimally displaced left fifth through ninth rib fractures. No pneumothorax. Trace left pleural effusion/pleural  thickening. 2. No additional acute traumatic injury to the chest, abdomen, or pelvis. 3. Remote sternal fracture.  Remote T6 compression fracture. 4. Coronary artery calcifications. Aortic Atherosclerosis (ICD10-I70.0). Electronically Signed   By: Keith Rake M.D.   On: 04/19/2019 21:20   Dg Chest 1 View  Result Date: 04/19/2019 CLINICAL DATA:  Fall EXAM: CHEST  1 VIEW COMPARISON:  12/29/2016 FINDINGS: Cardiomegaly. Both lungs are clear. Multiple minimally displaced fractures of the left ribs. IMPRESSION: 1. Multiple minimally displaced fractures of the left ribs. No pneumothorax or pleural effusion is appreciated. 2.  Cardiomegaly. Electronically Signed   By: Eddie Candle M.D.   On: 04/19/2019 19:38   Dg Pelvis 1-2 Views  Result Date: 04/19/2019 CLINICAL DATA:  Fall EXAM: PELVIS - 1-2 VIEW COMPARISON:  None. FINDINGS: Marked osteopenia. No obvious displaced fracture of the pelvis or bilateral proximal femurs. IMPRESSION: Marked osteopenia. No obvious displaced fracture of the pelvis or bilateral proximal femurs. Please note that plain radiographs are significantly limited for hip and pelvic fracture in the setting of osteopenia; recommend MRI to more sensitively evaluate for marrow edema and fracture if suspected. Electronically Signed   By: Eddie Candle  M.D.   On: 04/19/2019 19:40   Ct Head Wo Contrast  Addendum Date: 04/25/2019   ADDENDUM REPORT: 04/25/2019 18:41 ADDENDUM: These results were called by telephone at the time of interpretation on 04/25/2019 at 6:41 pm to Dr. Flora Lipps , who verbally acknowledged these results. Electronically Signed   By: Constance Holster M.D.   On: 04/25/2019 18:41   Result Date: 04/25/2019 CLINICAL DATA:  Acute unexplained confusion. EXAM: CT HEAD WITHOUT CONTRAST TECHNIQUE: Contiguous axial images were obtained from the base of the skull through the vertex without intravenous contrast. COMPARISON:  04/19/2019 FINDINGS: Brain: There is new loss of  gray-white differentiation involving approximately a 5.2 cm area of the left temporal lobe. There is new loss of gray-white differentiation involving the right frontal lobe measuring approximately 3 cm. There is age related volume loss bilaterally. Chronic microvascular ischemic changes are noted. There is no midline shift or mass effect. Vascular: Hyperdensity involving both MCAs is favored to be secondary to calcifications. Skull: Normal. Negative for fracture or focal lesion. Sinuses/Orbits: No acute finding. The patient is status post bilateral cataract surgery Other: None. IMPRESSION: 1. New hypoattenuating areas with associated loss of gray-white differentiation involving the left temporal and right frontal lobes as detailed above. These are highly suspicious for developing acute infarcts. Follow-up with MRI is recommended. Given the different vascular territories, an embolic phenomenon should be considered. 2. No mass effect no intracranial hemorrhage identified. Addendum to be added once the ordering provider has been contacted. Electronically Signed: By: Constance Holster M.D. On: 04/25/2019 18:27   Ct Head Wo Contrast  Result Date: 04/19/2019 CLINICAL DATA:  Unwitnessed fall. EXAM: CT HEAD WITHOUT CONTRAST CT CERVICAL SPINE WITHOUT CONTRAST TECHNIQUE: Multidetector CT imaging of the head and cervical spine was performed following the standard protocol without intravenous contrast. Multiplanar CT image reconstructions of the cervical spine were also generated. COMPARISON:  None. FINDINGS: CT HEAD FINDINGS Brain: No evidence of acute infarction, hemorrhage, hydrocephalus, extra-axial collection or mass lesion/mass effect. There is mild diffuse low-attenuation within the subcortical and periventricular white matter compatible with chronic microvascular disease. Vascular: No hyperdense vessel or unexpected calcification. Skull: Normal. Negative for fracture or focal lesion. Sinuses/Orbits: Mild mucosal  thickening involves the maxillary sinuses. Other: None. CT CERVICAL SPINE FINDINGS Alignment: Normal alignment of the cervical spine. Skull base and vertebrae: No acute fracture. No primary bone lesion or focal pathologic process. Soft tissues and spinal canal: No prevertebral fluid or swelling. No visible canal hematoma. Disc levels: Mild multi level ventral endplate spurring noted. Disc spaces are relatively well preserved. Upper chest: Negative. Other: None IMPRESSION: 1. No acute intracranial abnormalities. 2. Mild chronic small vessel ischemic change. 3. No evidence for cervical spine fracture or dislocation. Electronically Signed   By: Kerby Moors M.D.   On: 04/19/2019 19:25   Ct Chest Wo Contrast  Result Date: 04/19/2019 CLINICAL DATA:  85 year old post fall this morning. Rib fractures on radiograph. EXAM: CT CHEST, ABDOMEN AND PELVIS WITHOUT CONTRAST TECHNIQUE: Multidetector CT imaging of the chest, abdomen and pelvis was performed following the standard protocol without IV contrast. COMPARISON:  Chest radiograph earlier this day. FINDINGS: CT CHEST FINDINGS Cardiovascular: Aortic atherosclerosis. No periaortic stranding to suggest injury. Mild cardiomegaly. There are coronary artery calcifications. No pericardial effusion. Mediastinum/Nodes: Mediastinal hemorrhage or hematoma. No pneumomediastinum. The esophagus is decompressed. Slightly heterogeneous thyroid gland without dominant nodule. Evidence of mediastinal adenopathy, lack of IV contrast limits detailed assessment. Lungs/Pleura: No pneumothorax. Evidence pulmonary contusion. Trace left pleural thickening/effusion  and atelectasis the lower lobe. No right pleural fluid. Linear atelectasis or scarring in the anterior right upper lobe and right middle lobe. Calcified granuloma in the right middle lobe. No pulmonary edema. Musculoskeletal: Acute anterolateral minimally displaced left fifth through ninth rib fractures. Remote sternal fracture.  Remote T6 compression fracture. Included shoulder girdles and clavicles are intact. No confluent chest Guillot contusion. CT ABDOMEN PELVIS FINDINGS Hepatobiliary: Lack of IV contrast limits assessment for injury. Allowing for this, no evidence of hepatic injury. 11 mm simple cyst in the left lobe of the liver. No perihepatic hematoma. Gallbladder is unremarkable. Pancreas: No evidence of injury. No ductal dilatation or inflammation. Spleen: Lack of IV contrast limits detailed assessment. Allowing for this, no evidence of injury. No perisplenic hematoma. Adrenals/Urinary Tract: No adrenal hemorrhage or renal injury identified. Bilateral renal parenchymal thinning. Cortical scarring in the upper right kidney. Bladder is unremarkable. Stomach/Bowel: Small hiatal hernia. Stomach nondistended. No bowel Poucher thickening or inflammatory change. No evidence of bowel injury. No mesenteric hematoma. Sutures at the base of the cecum from presumed appendectomy. Distal colonic diverticulosis without diverticulitis. Vascular/Lymphatic: Aortic atherosclerosis without aneurysm. No retroperitoneal fluid or stranding. No adenopathy. Reproductive: Status post hysterectomy. No adnexal masses. Other: No free fluid or free air. No confluent body Frumkin contusion. Musculoskeletal: No fracture of the pelvis or lumbar spine. IMPRESSION: 1. Acute anterolateral minimally displaced left fifth through ninth rib fractures. No pneumothorax. Trace left pleural effusion/pleural thickening. 2. No additional acute traumatic injury to the chest, abdomen, or pelvis. 3. Remote sternal fracture.  Remote T6 compression fracture. 4. Coronary artery calcifications. Aortic Atherosclerosis (ICD10-I70.0). Electronically Signed   By: Keith Rake M.D.   On: 04/19/2019 21:20   Ct Cervical Spine Wo Contrast  Result Date: 04/19/2019 CLINICAL DATA:  Unwitnessed fall. EXAM: CT HEAD WITHOUT CONTRAST CT CERVICAL SPINE WITHOUT CONTRAST TECHNIQUE: Multidetector CT  imaging of the head and cervical spine was performed following the standard protocol without intravenous contrast. Multiplanar CT image reconstructions of the cervical spine were also generated. COMPARISON:  None. FINDINGS: CT HEAD FINDINGS Brain: No evidence of acute infarction, hemorrhage, hydrocephalus, extra-axial collection or mass lesion/mass effect. There is mild diffuse low-attenuation within the subcortical and periventricular white matter compatible with chronic microvascular disease. Vascular: No hyperdense vessel or unexpected calcification. Skull: Normal. Negative for fracture or focal lesion. Sinuses/Orbits: Mild mucosal thickening involves the maxillary sinuses. Other: None. CT CERVICAL SPINE FINDINGS Alignment: Normal alignment of the cervical spine. Skull base and vertebrae: No acute fracture. No primary bone lesion or focal pathologic process. Soft tissues and spinal canal: No prevertebral fluid or swelling. No visible canal hematoma. Disc levels: Mild multi level ventral endplate spurring noted. Disc spaces are relatively well preserved. Upper chest: Negative. Other: None IMPRESSION: 1. No acute intracranial abnormalities. 2. Mild chronic small vessel ischemic change. 3. No evidence for cervical spine fracture or dislocation. Electronically Signed   By: Kerby Moors M.D.   On: 04/19/2019 19:25   Mr Jodene Nam Head Wo Contrast  Result Date: 04/26/2019 CLINICAL DATA:  Stroke follow-up. Acute right frontal and posterior left temporal infarcts on MRI. EXAM: MRA HEAD WITHOUT CONTRAST TECHNIQUE: Angiographic images of the Circle of Willis were obtained using MRA technique without intravenous contrast. COMPARISON:  None. FINDINGS: The study is mildly motion degraded. The visualized distal vertebral arteries are widely patent to the basilar and codominant. Patent PICA and SCA origins are identified bilaterally. The basilar artery is widely patent. There is a large right posterior communicating artery with  hypoplastic right P1 segment. There is a bulbous appearance at the origin of the right P1 segment from the basilar artery measuring 2 mm. Focal transverse signal loss at the right P1-P2 junction may be artifactual although an underlying stenosis is possible. The left PCA is patent without evidence of significant proximal stenosis. There is a lack of flow related enhancement in the included distal segment and majority of the intracranial portion of the right ICA. There is distal reconstitution of the ICA at the level of the posterior communicating artery. Of note, a normal right ICA flow void was present on the MRI earlier today. The intracranial left ICA is patent with mild irregularity but no evidence of significant stenosis. The ACAs and MCAs are patent proximally. There is mild narrowing of the distal right M1 segment. Branch vessel evaluation is limited by motion artifact, however there do appear to be missing right MCA superior division branch vessels corresponding to the infarct on MRI. The left MCA has an early bifurcation without significant M1 or proximal M2 stenosis. No significant proximal ACA stenosis is identified within limitations of motion. IMPRESSION: 1. Motion degraded study. 2. Occlusion versus severely reduced flow in the right ICA with reconstitution via the posterior communicating artery. A normal flow void was present on the MRI earlier today. Head and neck CTA is recommended for further evaluation. 3. Suspected missing right MCA superior division branch vessels corresponding to the area of infarct on MRI. 4. 2 mm aneurysm versus infundibulum or motion artifact at the right PCA origin. This can also be further evaluated with CTA. Electronically Signed   By: Logan Bores M.D.   On: 04/26/2019 18:04   Mr Brain Wo Contrast  Result Date: 04/26/2019 CLINICAL DATA:  Stroke EXAM: MRI HEAD WITHOUT CONTRAST TECHNIQUE: Multiplanar, multiecho pulse sequences of the brain and surrounding structures  were obtained without intravenous contrast. COMPARISON:  CT head 04/25/2019 FINDINGS: Brain: Acute infarcts in the right frontal lobe and left posterior temporal lobe. These are moderately large cortically based infarct. No associated hemorrhage. Moderate chronic ischemic changes throughout the white matter and pons. Negative for hydrocephalus. Negative for hemorrhage or mass. Vascular: Normal arterial flow voids Skull and upper cervical spine: Negative Sinuses/Orbits: Mild mucosal edema paranasal sinuses. Bilateral cataract surgery Other: None IMPRESSION: Acute infarct right frontal lobe and left posterior temporal lobe. Negative for hemorrhage Moderate chronic microvascular ischemic change. Electronically Signed   By: Franchot Gallo M.D.   On: 04/26/2019 11:01   Dg Chest Port 1 View  Result Date: 04/22/2019 CLINICAL DATA:  Shortness of breath status post fall EXAM: PORTABLE CHEST 1 VIEW COMPARISON:  CT chest 04/19/2019 FINDINGS: Hazy right lower lobe airspace disease concerning for atelectasis/contusion versus pneumonia. Mild left basilar airspace disease likely reflecting atelectasis. No pneumothorax. Stable cardiomegaly. No acute osseous abnormality. Right rib fractures are not well delineated on the current exam. IMPRESSION: 1. Hazy right lower lobe airspace disease concerning for atelectasis/contusion versus pneumonia. Mild left basilar airspace disease likely reflecting atelectasis. Electronically Signed   By: Kathreen Devoid   On: 04/22/2019 12:22   Dg Abd Portable 1v  Result Date: 04/20/2019 CLINICAL DATA:  Abdominal distension EXAM: PORTABLE ABDOMEN - 1 VIEW COMPARISON:  CT abdomen 04/19/2019 FINDINGS: The bowel gas pattern is normal. No radio-opaque calculi or other significant radiographic abnormality are seen. Atherosclerotic aorta. IMPRESSION: Negative. Electronically Signed   By: Franchot Gallo M.D.   On: 04/20/2019 15:37   Vas US Carotid  Result Date: 04/29/2019 Carotid Arterial Duplex Study  Indications:       CVA, Speech disturbance, Syncope and Altered mental status. Risk Factors:      Hypertension. Limitations:       patient not responsive or arousable. Patient's head turned                    towards the right side and would not turn or keep head                    positioned. Comparison Study:  No prior study on file for comparison Performing Technologist: Sharion Dove RVS  Examination Guidelines: A complete evaluation includes B-mode imaging, spectral Doppler, color Doppler, and power Doppler as needed of all accessible portions of each vessel. Bilateral testing is considered an integral part of a complete examination. Limited examinations for reoccurring indications may be performed as noted.  Right Carotid Findings: +--------+--------+--------+--------+--------+--------+         PSV cm/sEDV cm/sStenosisDescribeComments +--------+--------+--------+--------+--------+--------+ CCA Prox31      5                                +--------+--------+--------+--------+--------+--------+ +----------+--------+-------+--------+-------------------+           PSV cm/sEDV cmsDescribeArm Pressure (mmHG) +----------+--------+-------+--------+-------------------+ [id number]                                        +----------+--------+-------+--------+-------------------+ +---------+--------+--------+------------+ VertebralPSV cm/sEDV cm/sNot assessed +---------+--------+--------+------------+ Right side not assessed secondary to patient's mental status  Left Carotid Findings: +----------+--------+--------+--------+------------+------------------+           PSV cm/sEDV cm/sStenosisDescribe    Comments           +----------+--------+--------+--------+------------+------------------+ CCA Prox  107     22                          intimal thickening +----------+--------+--------+--------+------------+------------------+ CCA Distal113     23                           intimal thickening +----------+--------+--------+--------+------------+------------------+ ICA Prox  94      21              heterogenous                   +----------+--------+--------+--------+------------+------------------+ ICA Distal100     27                                             +----------+--------+--------+--------+------------+------------------+ ECA       237     5                                              +----------+--------+--------+--------+------------+------------------+ +----------+--------+--------+--------+-------------------+ SubclavianPSV cm/sEDV cm/sDescribeArm Pressure (mmHG) +----------+--------+--------+--------+-------------------+           172                                         +----------+--------+--------+--------+-------------------+ +---------+--------+--+--------+--+ VertebralPSV  cm/s86EDV cm/s19 +---------+--------+--+--------+--+  Summary:  Left Carotid: Velocities in the left ICA are consistent with a 1-39% stenosis. Vertebrals:  Left vertebral artery demonstrates antegrade flow. Subclavians: Normal flow hemodynamics were seen in bilateral subclavian              arteries. *See table(s) above for measurements and observations.  Electronically signed by Antony Contras MD on 04/29/2019 at 12:47:12 PM.    Final    Vas [place] Lower Extremity Venous (dvt)  Result Date: 04/28/2019  Lower Venous Study Indications: Stroke.  Limitations: Patient confusion and inability to follow commands. Comparison Study: No prior study on file for comparison Performing Technologist: Sharion Dove RVS  Examination Guidelines: A complete evaluation includes B-mode imaging, spectral Doppler, color Doppler, and power Doppler as needed of all accessible portions of each vessel. Bilateral testing is considered an integral part of a complete examination. Limited examinations for reoccurring indications may be performed as noted.   +---------+---------------+---------+-----------+----------+-------+ RIGHT    CompressibilityPhasicitySpontaneityPropertiesSummary +---------+---------------+---------+-----------+----------+-------+ CFV      Full           Yes      Yes                          +---------+---------------+---------+-----------+----------+-------+ SFJ      Full                                                 +---------+---------------+---------+-----------+----------+-------+ FV Prox  Full                                                 +---------+---------------+---------+-----------+----------+-------+ FV Mid   Full                                                 +---------+---------------+---------+-----------+----------+-------+ FV DistalFull                                                 +---------+---------------+---------+-----------+----------+-------+ PFV      Full                                                 +---------+---------------+---------+-----------+----------+-------+ POP                     Yes      Yes                          +---------+---------------+---------+-----------+----------+-------+ PTV      Full                                                 +---------+---------------+---------+-----------+----------+-------+  PERO     Full                                                 +---------+---------------+---------+-----------+----------+-------+   +---------+---------------+---------+-----------+----------+-------+ LEFT     CompressibilityPhasicitySpontaneityPropertiesSummary +---------+---------------+---------+-----------+----------+-------+ CFV      Full           Yes      Yes                          +---------+---------------+---------+-----------+----------+-------+ SFJ      Full                                                 +---------+---------------+---------+-----------+----------+-------+ FV Prox  Full                                                  +---------+---------------+---------+-----------+----------+-------+ FV Mid   Full                                                 +---------+---------------+---------+-----------+----------+-------+ FV DistalFull                                                 +---------+---------------+---------+-----------+----------+-------+ PFV      Full                                                 +---------+---------------+---------+-----------+----------+-------+ POP                     Yes      Yes                          +---------+---------------+---------+-----------+----------+-------+ PTV      Full                                                 +---------+---------------+---------+-----------+----------+-------+ PERO     Full                                                 +---------+---------------+---------+-----------+----------+-------+     Summary: Right: There is no evidence of deep vein thrombosis in the lower extremity. Left: There is no evidence of deep vein thrombosis in the lower extremity.  *See table(s) above for measurements and observations. Electronically signed by Deitra Mayo MD on 04/28/2019 at 6:42:29  AM.    Final       The results of significant diagnostics from this hospitalization (including imaging, microbiology, ancillary and laboratory) are listed below for reference.     Microbiology: Recent Results (from the past 240 hour(s))  Culture, Urine     Status: Abnormal   Collection Time: 04/25/19  9:55 PM  Result Value Ref Range Status   Specimen Description URINE, CATHETERIZED  Final   Special Requests   Final    NONE Performed at [facility], [address] [place], [place] [address]    Culture >=100,000 COLONIES/mL ESCHERICHIA COLI (A)  Final   Report Status 04/28/2019 FINAL  Final   Organism ID, Bacteria ESCHERICHIA COLI (A)  Final      Susceptibility    Escherichia coli - MIC*    AMPICILLIN <=2 SENSITIVE Sensitive     CEFAZOLIN <=4 SENSITIVE Sensitive     CEFTRIAXONE <=1 SENSITIVE Sensitive     CIPROFLOXACIN <=0.25 SENSITIVE Sensitive     GENTAMICIN <=1 SENSITIVE Sensitive     IMIPENEM <=0.25 SENSITIVE Sensitive     NITROFURANTOIN <=16 SENSITIVE Sensitive     TRIMETH/SULFA <=20 SENSITIVE Sensitive     AMPICILLIN/SULBACTAM <=2 SENSITIVE Sensitive     PIP/TAZO <=4 SENSITIVE Sensitive     Extended ESBL NEGATIVE Sensitive     * >=100,000 COLONIES/mL ESCHERICHIA COLI  Culture, Urine     Status: Abnormal   Collection Time: 04/26/19  7:59 AM  Result Value Ref Range Status   Specimen Description URINE, CATHETERIZED  Final   Special Requests   Final    NONE Performed at [facility], [place] [address], [place],  [address]    Culture >=100,000 COLONIES/mL ESCHERICHIA COLI (A)  Final   Report Status 04/28/2019 FINAL  Final   Organism ID, Bacteria ESCHERICHIA COLI (A)  Final      Susceptibility   Escherichia coli - MIC*    AMPICILLIN <=2 SENSITIVE Sensitive     CEFAZOLIN <=4 SENSITIVE Sensitive     CEFTRIAXONE <=1 SENSITIVE Sensitive     CIPROFLOXACIN <=0.25 SENSITIVE Sensitive     GENTAMICIN <=1 SENSITIVE Sensitive     IMIPENEM <=0.25 SENSITIVE Sensitive     NITROFURANTOIN <=16 SENSITIVE Sensitive     TRIMETH/SULFA <=20 SENSITIVE Sensitive     AMPICILLIN/SULBACTAM <=2 SENSITIVE Sensitive     PIP/TAZO <=4 SENSITIVE Sensitive     Extended ESBL NEGATIVE Sensitive     * >=100,000 COLONIES/mL ESCHERICHIA COLI     Labs: BNP (last 3 results) Recent Labs    12/18/18 1021  BNP 315.1*   Basic Metabolic Panel: Recent Labs  Lab 04/24/19 0307 04/25/19 0322 04/26/19 0331 04/27/19 0351  NA 141 139 138 137  K 3.7 4.0 4.3 4.2  CL 115* 111 110 109  CO2 19* 20* 19* 20*  GLUCOSE 106* 101* 98 104*  BUN 31* 36* 31* 32*  CREATININE 1.26* 1.50* 1.51* 1.49*  CALCIUM 9.3 9.1 9.3 9.3  MG  --  2.3  --  2.4   Liver Function  Tests: No results for input(s): AST, ALT, ALKPHOS, BILITOT, PROT, ALBUMIN in the last 168 hours. No results for input(s): LIPASE, AMYLASE in the last 168 hours. No results for input(s): AMMONIA in the last 168 hours. CBC: Recent Labs  Lab 04/24/19 0307 04/25/19 0322 04/26/19 0331 04/27/19 0351  WBC 8.4 7.0 9.1 9.1  HGB 9.6* 9.1* 9.4* 8.6*  HCT 30.7* 28.4* 29.8* 28.0*  MCV 90.3 89.3 89.5 90.6  PLT 204 192 256 242   Cardiac Enzymes: No results for input(s): CKTOTAL, CKMB, CKMBINDEX, TROPONINI in the last 168 hours. BNP: Invalid input(s): POCBNP CBG: No results for input(s): GLUCAP in the last 168 hours. D-Dimer No results for input(s): DDIMER in the last 72 hours. Hgb A1c No results for input(s): HGBA1C in the last 72 hours. Lipid Profile No results for input(s): CHOL, HDL, LDLCALC, TRIG, CHOLHDL, LDLDIRECT in the last 72 hours. Thyroid function studies No results for input(s): TSH, T4TOTAL, T3FREE, THYROIDAB in the last 72 hours.  Invalid input(s): FREET3 Anemia work up No results for input(s): VITAMINB12, FOLATE, FERRITIN, TIBC, IRON, RETICCTPCT in the last 72 hours. Urinalysis    Component Value Date/Time   COLORURINE AMBER (A) 04/25/2019 2030   APPEARANCEUR TURBID (A) 04/25/2019 2030   LABSPEC 1.015 04/25/2019 2030   PHURINE 5.0 04/25/2019 2030   GLUCOSEU NEGATIVE 04/25/2019 2030   [place] (A) 04/25/2019 2030   [place] NEGATIVE 04/25/2019 2030   BILIRUBINUR neg 09/22/2014 [place] 04/25/2019 2030   PROTEINUR 30 (A) 04/25/2019 2030   UROBILINOGEN 0.2 10/15/2014 1144   NITRITE NEGATIVE 04/25/2019 2030   LEUKOCYTESUR LARGE (A) 04/25/2019 2030   Sepsis Labs Invalid input(s): PROCALCITONIN,  WBC,  LACTICIDVEN Microbiology Recent Results (from the past 240 hour(s))  Culture, Urine     Status: Abnormal   Collection Time: 04/25/19  9:55 PM  Result Value Ref Range Status   Specimen Description URINE, CATHETERIZED  Final   Special  Requests   Final    NONE Performed at [facility], [address] [place], [place] [address]    Culture >=100,000 COLONIES/mL ESCHERICHIA COLI (A)  Final   Report Status 04/28/2019 FINAL  Final   Organism ID, Bacteria ESCHERICHIA COLI (A)  Final      Susceptibility   Escherichia coli - MIC*    AMPICILLIN <=2 SENSITIVE Sensitive     CEFAZOLIN <=4 SENSITIVE Sensitive     CEFTRIAXONE <=1 SENSITIVE Sensitive     CIPROFLOXACIN <=0.25 SENSITIVE Sensitive     GENTAMICIN <=1 SENSITIVE Sensitive     IMIPENEM <=0.25 SENSITIVE Sensitive     NITROFURANTOIN <=16 SENSITIVE Sensitive     TRIMETH/SULFA <=20 SENSITIVE Sensitive     AMPICILLIN/SULBACTAM <=2 SENSITIVE Sensitive     PIP/TAZO <=4 SENSITIVE Sensitive     Extended ESBL NEGATIVE Sensitive     * >=100,000 COLONIES/mL ESCHERICHIA COLI  Culture, Urine     Status: Abnormal   Collection Time: 04/26/19  7:59 AM  Result Value Ref Range Status   Specimen Description URINE, CATHETERIZED  Final   Special Requests   Final    NONE Performed at [facility], [place] [address], [place], [place] [address]    Culture >=100,000 COLONIES/mL ESCHERICHIA COLI (A)  Final   Report Status 04/28/2019 FINAL  Final   Organism ID, Bacteria ESCHERICHIA COLI (A)  Final      Susceptibility   Escherichia coli - MIC*    AMPICILLIN <=2 SENSITIVE Sensitive     CEFAZOLIN <=4 SENSITIVE Sensitive     CEFTRIAXONE <=1 SENSITIVE Sensitive     CIPROFLOXACIN <=0.25 SENSITIVE Sensitive     GENTAMICIN <=1 SENSITIVE Sensitive     IMIPENEM <=0.25 SENSITIVE Sensitive     NITROFURANTOIN <=16 SENSITIVE Sensitive     TRIMETH/SULFA <=20 SENSITIVE Sensitive     AMPICILLIN/SULBACTAM <=2 SENSITIVE Sensitive     PIP/TAZO <=4 SENSITIVE Sensitive     Extended ESBL NEGATIVE Sensitive     * >=  100,000 COLONIES/mL ESCHERICHIA COLI     Time coordinating discharge:  I have spent 35 minutes face to face with the patient and on the ward discussing the patients care,  assessment, plan and disposition with other care givers. >50% of the time was devoted counseling the patient about the risks and benefits of treatment/Discharge disposition and coordinating care.   SIGNED:   Damita Lack, MD  [facility] 04/30/2019, 12:48 PM   If 7PM-7AM, please contact night-coverage [email or web address]

## 2019-04-30 NOTE — Progress Notes (Signed)
Report

## 2019-04-30 NOTE — Progress Notes (Signed)
Palliative:  Elizabeth Burch is more alert today at times but continues to be very confused and lethargic at time of my visit. Drool from mouth and high risk aspiration. Mentation not expected to allow for intake and unlikely to maintain alertness. Son, [name], confirms desire for hospice facility and CSW assisting to facilitate. All questions/concerns addressed. Emotional support provided.   Exam: More alert, lethargic. Confused. No distress.   Plan: - Full comfort care - Transition to hospice facility.   15 min  Vinie Sill, NP Palliative Medicine Team Pager # [phone] (M-F 8a-5p) Team Phone # [phone] (Nights/Weekends)

## 2019-04-30 NOTE — TOC Progression Note (Addendum)
Transition of Care [facility]) - Progression Note    Patient Details  Name: Elizabeth Burch MRN: [id number] Date of Birth: 1926/07/30  Transition [facility]) CM/SW [place], LCSW Phone Number: 04/30/2019, 10:34 AM  Clinical Narrative: Damaris Schooner to [facility] admissions coordinator. Hospice is not allowed in the facility right now due to COVID-19 restrictions and services would be private pay. $280 per day for private room and $260 for semiprivate. Discussed with son. He would like to see what insurance would cover at hospice house. Sent referral to [facility] per request.  11:56 am: [facility] RN is following up with patient's son.  There is a room and board cost of $140 that may be prorated according to her income. RN is requesting new rapid COVID test since last one was done 5/16. MD is aware and will order.  12:32 pm: Hospice House RN said son is agreeable to out of pocket cost. They can accept patient once we have negative COVID results.  3:34 pm: COVID results pending. Patient's son has completed admissions paperwork.  Expected Discharge Plan: [place] Barriers to Discharge: Continued Medical Work up  Expected Discharge Plan and Services Expected Discharge Plan: [place] In-house Referral: Clinical Social Work   Post Acute Care Choice: [place] Living arrangements for the past 2 months: Single Family Home Expected Discharge Date: 04/30/19                                     Social Determinants of Health (SDOH) Interventions    Readmission Risk Interventions No flowsheet data found.

## 2019-04-30 NOTE — Progress Notes (Signed)
PROGRESS NOTE    Elizabeth Burch  IDC:[id number] DOB: 1926-01-17 DOA: 04/19/2019 PCP: Chipper Herb, MD   Brief Narrative:  85 year old with history of stage IV CKD, history of DVT on Coumadin, gout, hypertension who lives independently presented after sustaining a fall and could not recall events prior to this.  Upon admission COVID test was negative, chest x-ray showed multiple nondisplaced left-sided rib fracture 5-9.  CT of the abdomen pelvis did not show any acute abnormality.  X-ray of the pelvis was negative.  Her Coumadin was held, MRI head in the hospital showed multifocal CVA in the left-sided frontal and posterior temporal lobes.  Aspirin was initiated.  Due to urinary retention, Foley was placed.  Was treated for UTI/E. coli with 1 dose of fosfomycin.  Overall due to her poor quality of life and failure to thrive, palliative team was consulted and it was determined to make patient comfort care.   Assessment & Plan:   Principal Problem:   Syncope Active Problems:   Essential hypertension   CKD (chronic kidney disease), stage IV (HCC)   Gout   History of DVT of lower extremity   Chronic anticoagulation   Multiple closed fractures of ribs of left side   Frequent falls   Goals of care, counseling/discussion   Palliative care by specialist   Cerebral embolism with cerebral infarction   Encephalopathy  Acute encephalopathy secondary to multifocal CVA on the right frontal and left posterior temporal lobe - MRI of the brain is positive for multifocal infarct.  Currently on aspirin.  Neurology following. Risk for intracranial bleeding therefore no anticoagulation for now.  Mechanical fall causing multiple left-sided rib fracture 5-9 -Minimally displaced.  Conservative management.  Urinary tract infection with E. coli -Due to multiple allergies, 1 dose of fosfomycin given by previous provider.  Urinary retention, resolved -Foley catheter placed.  Now in place for comfort  care as well.  Failure to thrive in adult - Overall poor prognosis.   Atrial fibrillation, chronic History of DVT -On Coumadin which is on hold due to large area of stroke with concern for intra-cranial hemorrhagic conversion  Essential hypertension -Resume home meds  DVT prophylaxis: Discontinued as patient is on comfort care Code Status: DNR Family Communication: Palliative care team has been in discussion with the patient's family, transition to comfort care Disposition Plan: Transition patient to hospice home  Consultants:   Palliative care  Trauma service  Neurology  Procedures:   None  Antimicrobials:   None   Subjective: Patient is laying in the bed, only alert to her name and answer basic questions otherwise states wants to be left alone.  Denies any acute complaints.  Review of Systems Otherwise negative except as per HPI, including: General: Denies fever, chills, night sweats or unintended weight loss. Resp: Denies cough, wheezing, shortness of breath. Cardiac: Denies chest pain, palpitations, orthopnea, paroxysmal nocturnal dyspnea. GI: Denies abdominal pain, nausea, vomiting, diarrhea or constipation GU: Denies dysuria, frequency, hesitancy or incontinence MS: Denies muscle aches, joint pain or swelling Neuro: Denies headache, neurologic deficits (focal weakness, numbness, tingling), abnormal gait Psych: Denies anxiety, depression, SI/HI/AVH Skin: Denies new rashes or lesions ID: Denies sick contacts, exotic exposures, travel  Objective: Vitals:   04/28/19 2119 04/29/19 0601 04/29/19 0915 04/29/19 1936  BP: (!) 139/58 (!) 150/37 (!) 154/64 (!) 129/42  Pulse: 81 67  61  Resp: (!) 23 (!) 23 (!) 21 16  Temp: 97.9 F (36.6 C) 97.8 F (36.6 C) 97.9 F (36.6  C) 97.9 F (36.6 C)  TempSrc: Axillary Oral Axillary Oral  SpO2: 94% 93% 99% 92%  Weight:  55.5 kg    Height:        Intake/Output Summary (Last 24 hours) at 04/30/2019 0809 Last data  filed at 04/30/2019 7628 Gross per 24 hour  Intake 485.43 ml  Output 1000 ml  Net -514.57 ml   Filed Weights   04/25/19 0524 04/26/19 0514 04/29/19 0601  Weight: 66.8 kg 68.8 kg 55.5 kg    Examination:  General exam: Does not appear to be any acute distress Respiratory system: Clear to auscultation. Respiratory effort normal. Cardiovascular system: S1 & S2 heard, RRR. No JVD, murmurs, rubs, gallops or clicks. No pedal edema. Gastrointestinal system: Abdomen is nondistended, soft and nontender. No organomegaly or masses felt. Normal bowel sounds heard. Central nervous system: Alert and oriented. No focal neurological deficits. Extremities: Symmetric 4 x 5 power. Skin: No rashes, lesions or ulcers Psychiatry: Poor judgment, only alert to her name    Data Reviewed:   CBC: Recent Labs  Lab 04/24/19 0307 04/25/19 0322 04/26/19 0331 04/27/19 0351  WBC 8.4 7.0 9.1 9.1  HGB 9.6* 9.1* 9.4* 8.6*  HCT 30.7* 28.4* 29.8* 28.0*  MCV 90.3 89.3 89.5 90.6  PLT 204 192 256 315   Basic Metabolic Panel: Recent Labs  Lab 04/24/19 0307 04/25/19 0322 04/26/19 0331 04/27/19 0351  NA 141 139 138 137  K 3.7 4.0 4.3 4.2  CL 115* 111 110 109  CO2 19* 20* 19* 20*  GLUCOSE 106* 101* 98 104*  BUN 31* 36* 31* 32*  CREATININE 1.26* 1.50* 1.51* 1.49*  CALCIUM 9.3 9.1 9.3 9.3  MG  --  2.3  --  2.4   GFR: Estimated Creatinine Clearance: 19.8 mL/min (A) (by C-G formula based on SCr of 1.49 mg/dL (H)). Liver Function Tests: No results for input(s): AST, ALT, ALKPHOS, BILITOT, PROT, ALBUMIN in the last 168 hours. No results for input(s): LIPASE, AMYLASE in the last 168 hours. No results for input(s): AMMONIA in the last 168 hours. Coagulation Profile: Recent Labs  Lab 04/24/19 0307 04/25/19 0322  INR 1.3* 1.3*   Cardiac Enzymes: No results for input(s): CKTOTAL, CKMB, CKMBINDEX, TROPONINI in the last 168 hours. BNP (last 3 results) No results for input(s): PROBNP in the last 8760  hours. HbA1C: No results for input(s): HGBA1C in the last 72 hours. CBG: No results for input(s): GLUCAP in the last 168 hours. Lipid Profile: No results for input(s): CHOL, HDL, LDLCALC, TRIG, CHOLHDL, LDLDIRECT in the last 72 hours. Thyroid Function Tests: No results for input(s): TSH, T4TOTAL, FREET4, T3FREE, THYROIDAB in the last 72 hours. Anemia Panel: No results for input(s): VITAMINB12, FOLATE, FERRITIN, TIBC, IRON, RETICCTPCT in the last 72 hours. Sepsis Labs: No results for input(s): PROCALCITON, LATICACIDVEN in the last 168 hours.  Recent Results (from the past 240 hour(s))  MRSA PCR Screening     Status: None   Collection Time: 04/20/19  9:42 AM  Result Value Ref Range Status   MRSA by PCR NEGATIVE NEGATIVE Final    Comment:        The GeneXpert MRSA Assay (FDA approved for NASAL specimens only), is one component of a comprehensive MRSA colonization surveillance program. It is not intended to diagnose MRSA infection nor to guide or monitor treatment for MRSA infections. Performed at [facility], [place] [address], [place], [place] [address]   Culture, Urine     Status: Abnormal   Collection  Time: 04/25/19  9:55 PM  Result Value Ref Range Status   Specimen Description URINE, CATHETERIZED  Final   Special Requests   Final    NONE Performed at [facility], [address] [place], [place] [address]    Culture >=100,000 COLONIES/mL ESCHERICHIA COLI (A)  Final   Report Status 04/28/2019 FINAL  Final   Organism ID, Bacteria ESCHERICHIA COLI (A)  Final      Susceptibility   Escherichia coli - MIC*    AMPICILLIN <=2 SENSITIVE Sensitive     CEFAZOLIN <=4 SENSITIVE Sensitive     CEFTRIAXONE <=1 SENSITIVE Sensitive     CIPROFLOXACIN <=0.25 SENSITIVE Sensitive     GENTAMICIN <=1 SENSITIVE Sensitive     IMIPENEM <=0.25 SENSITIVE Sensitive     NITROFURANTOIN <=16 SENSITIVE Sensitive     TRIMETH/SULFA <=20 SENSITIVE Sensitive     AMPICILLIN/SULBACTAM <=2  SENSITIVE Sensitive     PIP/TAZO <=4 SENSITIVE Sensitive     Extended ESBL NEGATIVE Sensitive     * >=100,000 COLONIES/mL ESCHERICHIA COLI  Culture, Urine     Status: Abnormal   Collection Time: 04/26/19  7:59 AM  Result Value Ref Range Status   Specimen Description URINE, CATHETERIZED  Final   Special Requests   Final    NONE Performed at [facility], [place] [address], [place], [place] [address]    Culture >=100,000 COLONIES/mL ESCHERICHIA COLI (A)  Final   Report Status 04/28/2019 FINAL  Final   Organism ID, Bacteria ESCHERICHIA COLI (A)  Final      Susceptibility   Escherichia coli - MIC*    AMPICILLIN <=2 SENSITIVE Sensitive     CEFAZOLIN <=4 SENSITIVE Sensitive     CEFTRIAXONE <=1 SENSITIVE Sensitive     CIPROFLOXACIN <=0.25 SENSITIVE Sensitive     GENTAMICIN <=1 SENSITIVE Sensitive     IMIPENEM <=0.25 SENSITIVE Sensitive     NITROFURANTOIN <=16 SENSITIVE Sensitive     TRIMETH/SULFA <=20 SENSITIVE Sensitive     AMPICILLIN/SULBACTAM <=2 SENSITIVE Sensitive     PIP/TAZO <=4 SENSITIVE Sensitive     Extended ESBL NEGATIVE Sensitive     * >=100,000 COLONIES/mL ESCHERICHIA COLI         Radiology Studies: No results found.      Scheduled Meds: . lidocaine  1 patch Transdermal Q24H   Continuous Infusions: . dextrose 50 mL/hr at 04/30/19 0020     LOS: 10 days   Time spent= 25 mins    Ankit Arsenio Loader, MD [facility]  If 7PM-7AM, please contact night-coverage [email or web address] 04/30/2019, 8:09 AM

## 2019-04-30 NOTE — TOC Transition Note (Signed)
Transition of Care [facility]) - CM/SW Discharge Note   Patient Details  Name: MAELEIGH BUSCHMAN MRN: [id number] Date of Birth: [birth year]  Transition of [facility] CM/SW Contact:  Candie Chroman, LCSW Phone Number: 04/30/2019, 4:02 PM   Clinical Narrative: CSW facilitated patient discharge including contacting patient family and facility to confirm patient discharge plans. Clinical information faxed to facility and family agreeable with plan. CSW arranged ambulance transport via [place] [facility]. RN to call report prior to discharge ([phone]).  CSW will sign off for now as social work intervention is no longer needed. Please consult [place] again if new needs arise.  Final next level of care: [place] Barriers to Discharge: Barriers Resolved   Patient Goals and CMS Choice Patient states their goals for this hospitalization and ongoing recovery are:: Patient not fully oriented. CMS Medicare.gov Compare Post Acute Care list provided to:: Patient Represenative (must comment) Choice offered to / list presented to : Adult Children  Discharge Placement              Patient chooses bed at: [facility]) Patient to be transferred to facility by: [place] Name of family member notified: Brennley Curtice Patient and family notified of of transfer: 04/30/19  Discharge Plan and Services In-house Referral: Clinical Social Work   Post Acute Care Choice: [place]                               Social Determinants of Health (SDOH) Interventions     Readmission Risk Interventions No flowsheet data found.

## 2019-05-01 ENCOUNTER — Encounter: Payer: Self-pay | Admitting: *Deleted

## 2019-05-01 NOTE — Progress Notes (Unsigned)
   [facility] Discharge Chart Review  Chart Review: 05/01/2019   Admit date: 04/19/2019 Discharge date: 04/30/2019  Admitted From: Home Disposition:  [place]   A post discharge chart review was done today and there is a plan in place for comfort care. Her home medications were discontinued. Patient does not need Transitional Care Management for this discharge.      Chong Sicilian, RN-BC, BSN Nurse Care Manager [facility] [phone]

## 2019-05-21 ENCOUNTER — Ambulatory Visit: Payer: Medicare Other | Admitting: Family Medicine

## 2019-06-04 ENCOUNTER — Encounter: Payer: Self-pay | Admitting: Pharmacist Clinician (PhC)/ Clinical Pharmacy Specialist

## 2019-06-04 DEATH — deceased

## 2020-04-21 IMAGING — CT CT HEAD WITHOUT CONTRAST
3 series · 14 of 47 positions shown, 16 images · non-contrast
Comparison: 04/19/2019
COMPARISON: 04/19/2019

Addendum:
CLINICAL DATA: Acute unexplained confusion.

EXAM:
CT HEAD WITHOUT CONTRAST
TECHNIQUE: Contiguous axial images were obtained from the base of the skull
through the vertex without intravenous contrast.

[Series 3: head 5.0 h30s · axial · 0.42mm/px · z∈[-105,+35]mm · 8 of 34 slices shown, 10 images]
[im 3/34  brain]
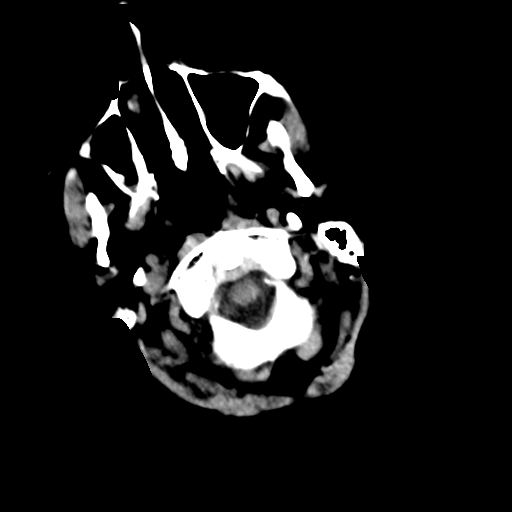
[im 3/34  bone]
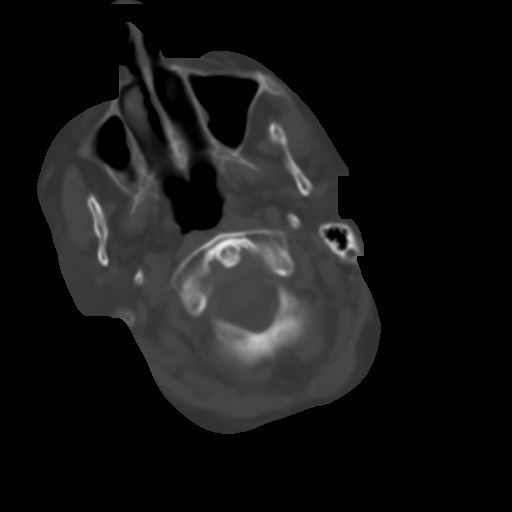
[im 7/34  brain]
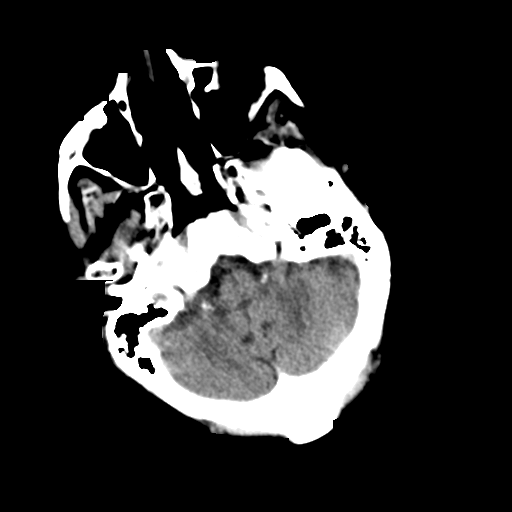
[im 11/34  brain]
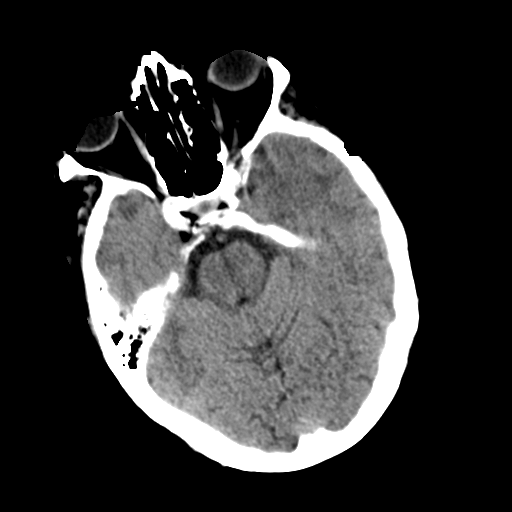
[im 15/34  brain]
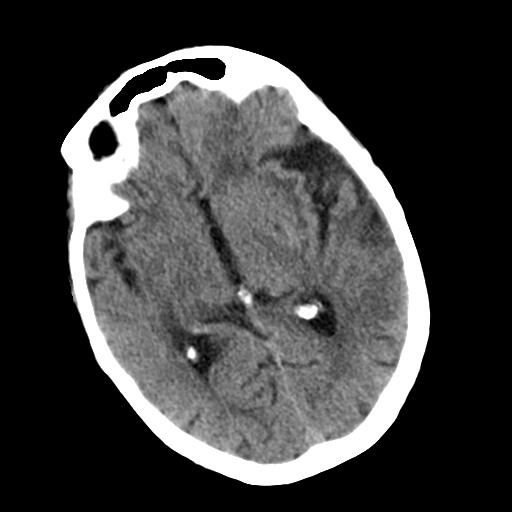
[im 19/34  brain]
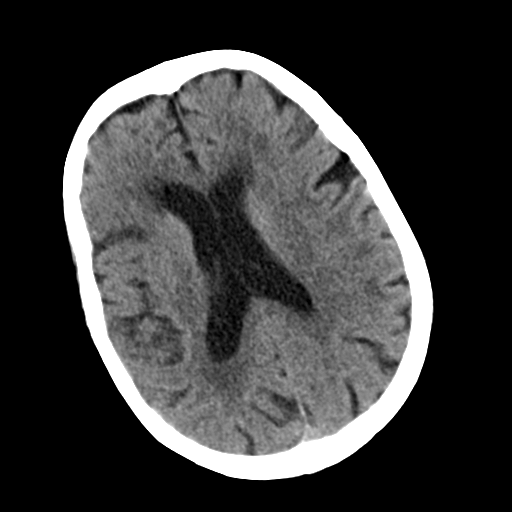
[im 19/34  bone]
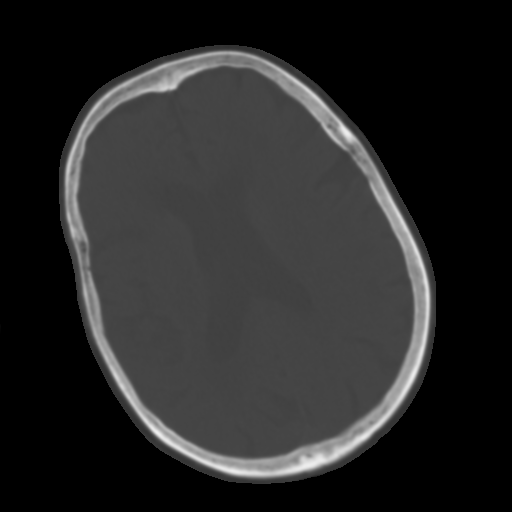
[im 23/34  brain]
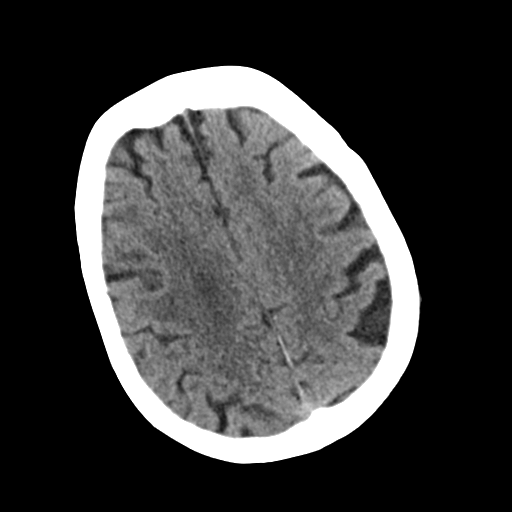
[im 27/34  brain]
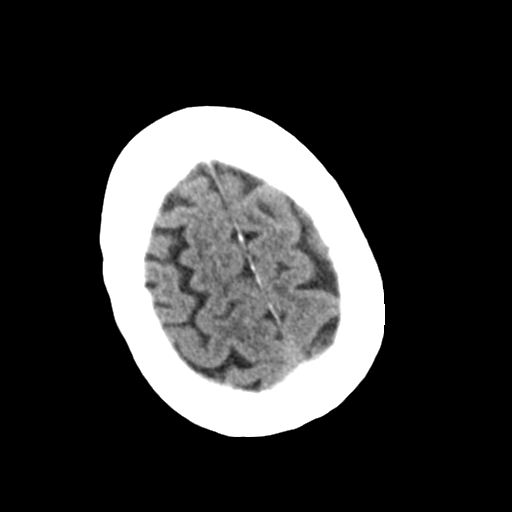
[im 31/34  brain]
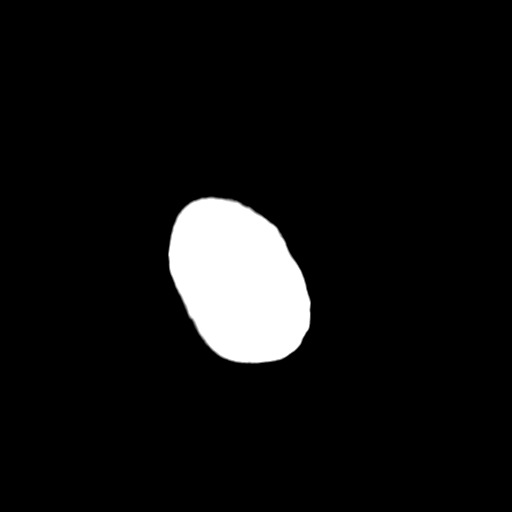

[Series 5: head 3.0 mpr cor · coronal · 0.29mm/px · 3 of 68 slices shown]
[im 23/68  brain]
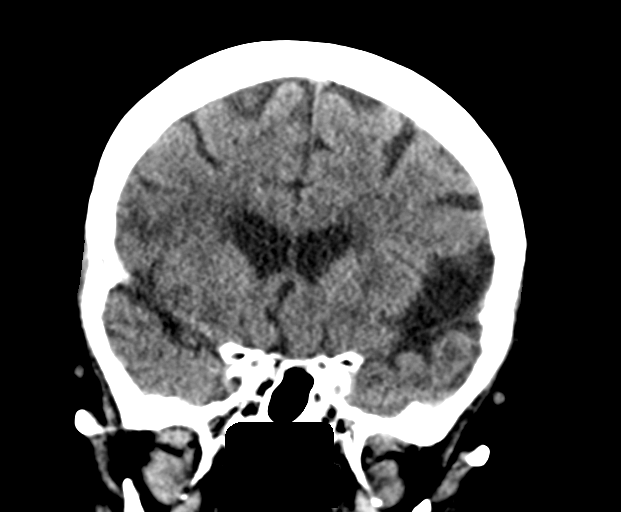
[im 30/68  brain]
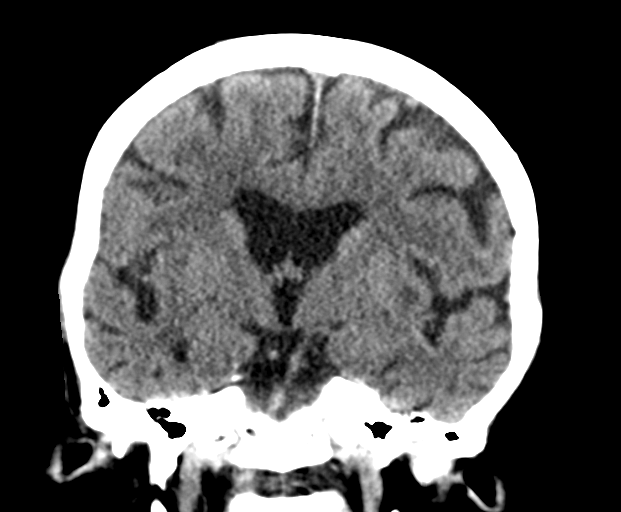
[im 38/68  brain]
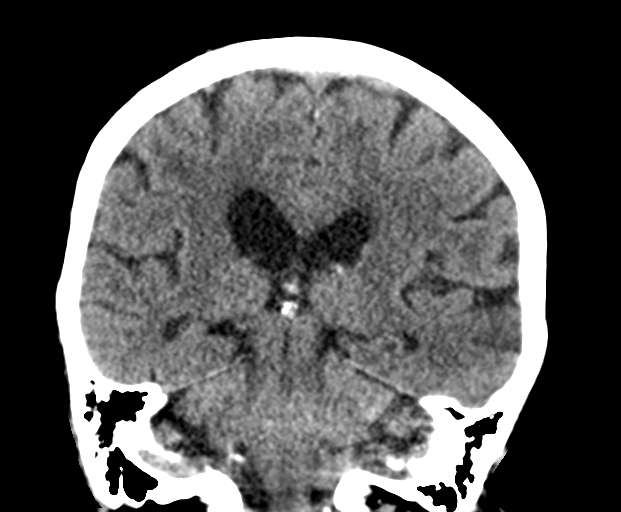

[Series 6: head 3.0 mpr sag · sagittal · 0.33mm/px · 3 of 57 slices shown]
[im 22/57  brain]
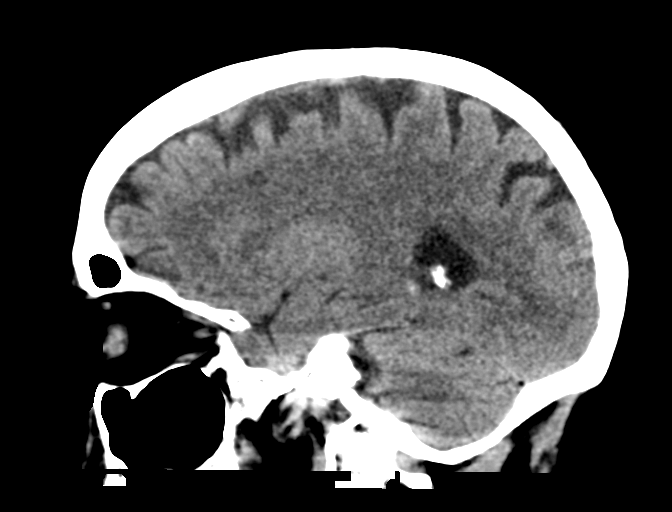
[im 29/57  brain]
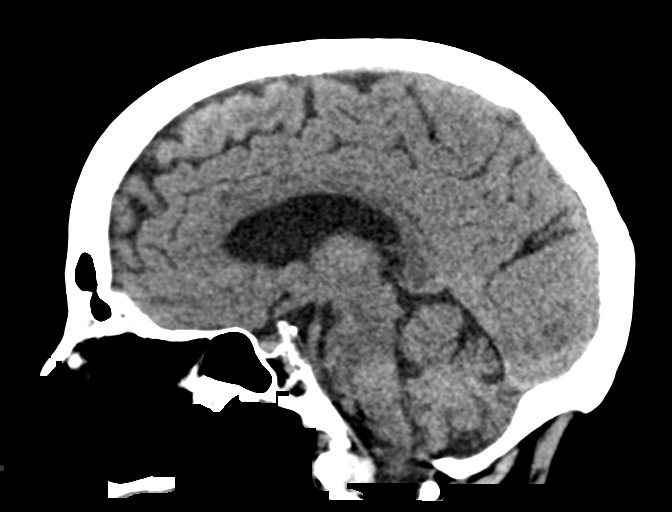
[im 36/57  brain]
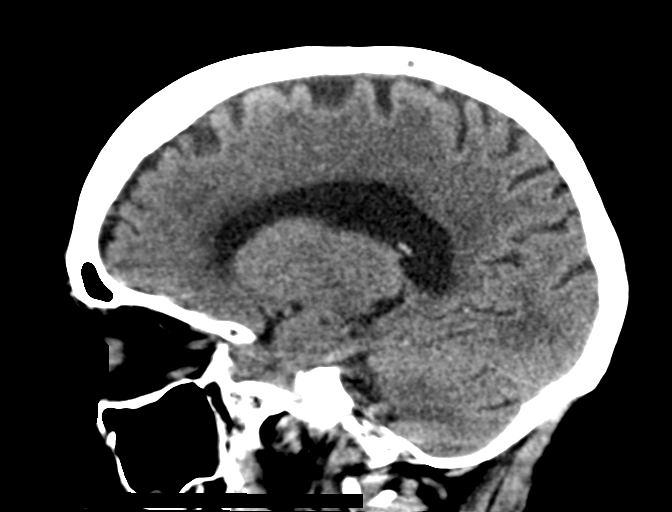

[14 of 47 positions shown; findings below may reference images not displayed]

FINDINGS: Brain: There is new loss of gray-white differentiation involving
approximately a 5.2 cm area of the left temporal lobe. There is new
loss of gray-white differentiation involving the right frontal lobe
measuring approximately 3 cm. There is age related volume loss
bilaterally. Chronic microvascular ischemic changes are noted. There
is no midline shift or mass effect.

Vascular: Hyperdensity involving both MCAs is favored to be
secondary to calcifications.

Skull: Normal. Negative for fracture or focal lesion.

Sinuses/Orbits: No acute finding. The patient is status post
bilateral cataract surgery

Other: None.
IMPRESSION: 1. New hypoattenuating areas with associated loss of gray-white
differentiation involving the left temporal and right frontal lobes
as detailed above. These are highly suspicious for developing acute
infarcts. Follow-up with MRI is recommended. Given the different
vascular territories, an embolic phenomenon should be considered.
2. No mass effect no intracranial hemorrhage identified.

Addendum to be added once the ordering provider has been contacted.

ADDENDUM:
These results were called by telephone at the time of interpretation
on 04/25/2019 at [DATE] to Dr. KUJTAA ACKERMANN , who verbally
acknowledged these results.

*** End of Addendum ***
FINDINGS: Brain: There is new loss of gray-white differentiation involving
approximately a 5.2 cm area of the left temporal lobe. There is new
loss of gray-white differentiation involving the right frontal lobe
measuring approximately 3 cm. There is age related volume loss
bilaterally. Chronic microvascular ischemic changes are noted. There
is no midline shift or mass effect.

Vascular: Hyperdensity involving both MCAs is favored to be
secondary to calcifications.

Skull: Normal. Negative for fracture or focal lesion.

Sinuses/Orbits: No acute finding. The patient is status post
bilateral cataract surgery

Other: None.
IMPRESSION: 1. New hypoattenuating areas with associated loss of gray-white
differentiation involving the left temporal and right frontal lobes
as detailed above. These are highly suspicious for developing acute
infarcts. Follow-up with MRI is recommended. Given the different
vascular territories, an embolic phenomenon should be considered.
2. No mass effect no intracranial hemorrhage identified.

Addendum to be added once the ordering provider has been contacted.
